# Patient Record
Sex: Female | Born: 1945 | Race: White | Hispanic: No | Marital: Married | Smoking: Never smoker
Health system: Southern US, Community
[De-identification: ages and names within clinical notes are randomized; demographics above are authoritative.]

## PROBLEM LIST (undated history)

## (undated) DIAGNOSIS — E079 Disorder of thyroid, unspecified: Secondary | ICD-10-CM

## (undated) DIAGNOSIS — D649 Anemia, unspecified: Secondary | ICD-10-CM

## (undated) DIAGNOSIS — E119 Type 2 diabetes mellitus without complications: Secondary | ICD-10-CM

## (undated) DIAGNOSIS — E7800 Pure hypercholesterolemia, unspecified: Secondary | ICD-10-CM

## (undated) DIAGNOSIS — M109 Gout, unspecified: Secondary | ICD-10-CM

## (undated) DIAGNOSIS — H269 Unspecified cataract: Secondary | ICD-10-CM

## (undated) DIAGNOSIS — J189 Pneumonia, unspecified organism: Secondary | ICD-10-CM

## (undated) DIAGNOSIS — M1990 Unspecified osteoarthritis, unspecified site: Secondary | ICD-10-CM

## (undated) DIAGNOSIS — I10 Essential (primary) hypertension: Secondary | ICD-10-CM

## (undated) HISTORY — DX: Gout, unspecified: M10.9

## (undated) HISTORY — PX: ABDOMINAL HYSTERECTOMY: SHX81

## (undated) HISTORY — DX: Unspecified cataract: H26.9

## (undated) HISTORY — DX: Anemia, unspecified: D64.9

## (undated) HISTORY — DX: Essential (primary) hypertension: I10

## (undated) HISTORY — PX: TUBAL LIGATION: SHX77

## (undated) HISTORY — PX: COLONOSCOPY: SHX174

## (undated) HISTORY — DX: Unspecified osteoarthritis, unspecified site: M19.90

## (undated) HISTORY — PX: OTHER SURGICAL HISTORY: SHX169

## (undated) HISTORY — DX: Disorder of thyroid, unspecified: E07.9

---

## 1998-06-03 ENCOUNTER — Inpatient Hospital Stay (HOSPITAL_COMMUNITY): Admission: EM | Admit: 1998-06-03 | Discharge: 1998-06-08 | Payer: Self-pay | Admitting: Obstetrics and Gynecology

## 1998-09-17 ENCOUNTER — Other Ambulatory Visit: Admission: RE | Admit: 1998-09-17 | Discharge: 1998-09-17 | Payer: Self-pay | Admitting: Obstetrics and Gynecology

## 1999-09-30 ENCOUNTER — Other Ambulatory Visit: Admission: RE | Admit: 1999-09-30 | Discharge: 1999-09-30 | Payer: Self-pay | Admitting: Obstetrics and Gynecology

## 1999-10-14 ENCOUNTER — Encounter: Admission: RE | Admit: 1999-10-14 | Discharge: 1999-10-14 | Payer: Self-pay | Admitting: Obstetrics and Gynecology

## 1999-10-14 ENCOUNTER — Encounter: Payer: Self-pay | Admitting: Obstetrics and Gynecology

## 2000-09-29 ENCOUNTER — Other Ambulatory Visit: Admission: RE | Admit: 2000-09-29 | Discharge: 2000-09-29 | Payer: Self-pay | Admitting: Obstetrics and Gynecology

## 2000-11-10 ENCOUNTER — Encounter: Admission: RE | Admit: 2000-11-10 | Discharge: 2000-11-10 | Payer: Self-pay | Admitting: Obstetrics and Gynecology

## 2000-11-10 ENCOUNTER — Encounter: Payer: Self-pay | Admitting: Obstetrics and Gynecology

## 2001-09-11 ENCOUNTER — Ambulatory Visit (HOSPITAL_COMMUNITY): Admission: RE | Admit: 2001-09-11 | Discharge: 2001-09-11 | Payer: Self-pay | Admitting: Gastroenterology

## 2001-10-04 ENCOUNTER — Other Ambulatory Visit: Admission: RE | Admit: 2001-10-04 | Discharge: 2001-10-04 | Payer: Self-pay | Admitting: Obstetrics and Gynecology

## 2001-11-14 ENCOUNTER — Encounter: Admission: RE | Admit: 2001-11-14 | Discharge: 2001-11-14 | Payer: Self-pay | Admitting: Obstetrics and Gynecology

## 2001-11-14 ENCOUNTER — Encounter: Payer: Self-pay | Admitting: Obstetrics and Gynecology

## 2002-11-19 ENCOUNTER — Encounter: Admission: RE | Admit: 2002-11-19 | Discharge: 2002-11-19 | Payer: Self-pay | Admitting: Obstetrics and Gynecology

## 2002-11-19 ENCOUNTER — Encounter: Payer: Self-pay | Admitting: Obstetrics and Gynecology

## 2003-11-25 ENCOUNTER — Encounter: Admission: RE | Admit: 2003-11-25 | Discharge: 2003-11-25 | Payer: Self-pay | Admitting: Obstetrics and Gynecology

## 2004-12-04 ENCOUNTER — Encounter: Admission: RE | Admit: 2004-12-04 | Discharge: 2004-12-04 | Payer: Self-pay | Admitting: Obstetrics and Gynecology

## 2005-12-07 ENCOUNTER — Encounter: Admission: RE | Admit: 2005-12-07 | Discharge: 2005-12-07 | Payer: Self-pay | Admitting: Obstetrics and Gynecology

## 2006-12-09 ENCOUNTER — Encounter: Admission: RE | Admit: 2006-12-09 | Discharge: 2006-12-09 | Payer: Self-pay | Admitting: Obstetrics and Gynecology

## 2007-12-12 ENCOUNTER — Encounter: Admission: RE | Admit: 2007-12-12 | Discharge: 2007-12-12 | Payer: Self-pay | Admitting: Obstetrics and Gynecology

## 2008-12-12 ENCOUNTER — Encounter: Admission: RE | Admit: 2008-12-12 | Discharge: 2008-12-12 | Payer: Self-pay | Admitting: Obstetrics and Gynecology

## 2009-12-17 ENCOUNTER — Encounter: Admission: RE | Admit: 2009-12-17 | Discharge: 2009-12-17 | Payer: Self-pay | Admitting: Obstetrics and Gynecology

## 2010-12-04 ENCOUNTER — Other Ambulatory Visit: Payer: Self-pay | Admitting: Obstetrics and Gynecology

## 2010-12-04 DIAGNOSIS — Z1231 Encounter for screening mammogram for malignant neoplasm of breast: Secondary | ICD-10-CM

## 2010-12-16 ENCOUNTER — Ambulatory Visit: Payer: Self-pay | Admitting: Vascular Surgery

## 2010-12-23 ENCOUNTER — Ambulatory Visit
Admission: RE | Admit: 2010-12-23 | Discharge: 2010-12-23 | Disposition: A | Discharge status: Home / Self Care | Payer: 59 | Source: Ambulatory Visit | Attending: Obstetrics and Gynecology | Admitting: Obstetrics and Gynecology

## 2010-12-23 DIAGNOSIS — Z1231 Encounter for screening mammogram for malignant neoplasm of breast: Secondary | ICD-10-CM

## 2011-01-08 NOTE — Op Note (Signed)
[facility]  Patient:    Isabella Dixon, DUROCHER Visit Number: [id number] MRN: [id number]          Service Type: END Location: ENDO Attending Physician:  Louie Bun Dictated by:   Everardo All Madilyn Fireman, M.D. Proc. Date: 09/11/01 Admit Date:  09/11/2001   CC:         Dr. Earl Lites, [facility] [address] [place], [place] [address]                           Operative Report  PROCEDURE:  Colonoscopy.  INDICATION FOR PROCEDURE:  History of adenomatous colon polyps found on initial colonoscopy three years ago.  DESCRIPTION OF PROCEDURE:  The patient was placed in the left lateral decubitus position and placed on the pulse monitor with continuous low flow oxygen delivered by nasal cannula. She was sedated with 120 mg IV Demerol and 12 mg IV Versed and was given 0.4 mg IV Narcan after the procedure due to low oxygen saturations. The Olympus video colonoscope was inserted into the rectum and advanced to the cecum, confirmed by transillumination of McBurneys point and visualization of the ileocecal valve and appendiceal orifice. The prep was generally good distal to the hepatic flexure with somewhat limited proximal to this and I could not rule out small lesions in the right colon less than 1 cm in diameter. Otherwise, the cecum, ascending, transverse, descending, and sigmoid colon all appeared normal with no masses, polyps, diverticula, or mucosal abnormalities. The rectum likewise appeared normal and retroflex view of the anus revealed no obvious internal hemorrhoids. The colonoscope was then withdrawn and the patient returned to the recovery room in stable condition. She tolerated the procedure well with the exception of some decreased oxygen saturation from fairly high amounts of sedation required, but there were no immediate complications.  IMPRESSION:  Essentially normal colonoscopy.  PLAN:  Repeat colonoscopy in five years. Dictated  by:   Everardo All Madilyn Fireman, M.D. Attending Physician:  Louie Bun DD:  09/11/01 TD:  09/11/01 Job: [id number]

## 2011-03-31 LAB — HM COLONOSCOPY

## 2011-12-21 ENCOUNTER — Other Ambulatory Visit: Payer: Self-pay | Admitting: Obstetrics and Gynecology

## 2011-12-21 DIAGNOSIS — Z1231 Encounter for screening mammogram for malignant neoplasm of breast: Secondary | ICD-10-CM

## 2012-01-12 ENCOUNTER — Ambulatory Visit
Admission: RE | Admit: 2012-01-12 | Discharge: 2012-01-12 | Disposition: A | Discharge status: Home / Self Care | Payer: 59 | Source: Ambulatory Visit | Attending: Obstetrics and Gynecology | Admitting: Obstetrics and Gynecology

## 2012-01-12 DIAGNOSIS — Z1231 Encounter for screening mammogram for malignant neoplasm of breast: Secondary | ICD-10-CM

## 2012-02-23 LAB — CBC AND DIFFERENTIAL
HCT: 35 % — AB (ref 36–46)
Hemoglobin: 11.1 g/dL — AB (ref 12.0–16.0)
WBC: 4.2 10^3/mL

## 2012-02-23 LAB — BASIC METABOLIC PANEL
BUN: 30 mg/dL — AB (ref 4–21)
Glucose: 88 mg/dL
Potassium: 4.2 mmol/L (ref 3.4–5.3)
Sodium: 141 mmol/L (ref 137–147)

## 2012-02-23 LAB — HEPATIC FUNCTION PANEL: Alkaline Phosphatase: 27 U/L (ref 25–125)

## 2012-03-13 ENCOUNTER — Other Ambulatory Visit: Payer: Self-pay | Admitting: *Deleted

## 2012-03-13 MED ORDER — CHOLINE FENOFIBRATE 135 MG PO CPDR: 135.0000 mg | DELAYED_RELEASE_CAPSULE | Freq: Every day | ORAL | Status: DC

## 2012-03-13 MED ORDER — METOPROLOL TARTRATE 25 MG PO TABS: 25.0000 mg | ORAL_TABLET | Freq: Two times a day (BID) | ORAL | Status: DC

## 2012-03-13 MED ORDER — LOSARTAN POTASSIUM 100 MG PO TABS: 100.0000 mg | ORAL_TABLET | Freq: Every day | ORAL | Status: DC

## 2012-03-13 MED ORDER — ROSUVASTATIN CALCIUM 20 MG PO TABS: 20.0000 mg | ORAL_TABLET | Freq: Every day | ORAL | Status: DC

## 2012-05-02 ENCOUNTER — Ambulatory Visit (INDEPENDENT_AMBULATORY_CARE_PROVIDER_SITE_OTHER): Payer: Medicare Other | Admitting: Emergency Medicine

## 2012-05-02 ENCOUNTER — Encounter: Payer: Self-pay | Admitting: Emergency Medicine

## 2012-05-02 VITALS — BP 118/80 | HR 70 | Temp 98.7°F | Resp 16 | Ht 62.5 in | Wt 187.4 lb

## 2012-05-02 DIAGNOSIS — Z23 Encounter for immunization: Secondary | ICD-10-CM

## 2012-05-02 DIAGNOSIS — R87629 Unspecified abnormal cytological findings in specimens from vagina: Secondary | ICD-10-CM | POA: Insufficient documentation

## 2012-05-02 DIAGNOSIS — E039 Hypothyroidism, unspecified: Secondary | ICD-10-CM

## 2012-05-02 DIAGNOSIS — M109 Gout, unspecified: Secondary | ICD-10-CM

## 2012-05-02 DIAGNOSIS — M1990 Unspecified osteoarthritis, unspecified site: Secondary | ICD-10-CM

## 2012-05-02 DIAGNOSIS — E785 Hyperlipidemia, unspecified: Secondary | ICD-10-CM

## 2012-05-02 DIAGNOSIS — R739 Hyperglycemia, unspecified: Secondary | ICD-10-CM | POA: Insufficient documentation

## 2012-05-02 DIAGNOSIS — IMO0002 Reserved for concepts with insufficient information to code with codable children: Secondary | ICD-10-CM | POA: Insufficient documentation

## 2012-05-02 DIAGNOSIS — I10 Essential (primary) hypertension: Secondary | ICD-10-CM | POA: Insufficient documentation

## 2012-05-02 DIAGNOSIS — Z0000 Encounter for general adult medical examination without abnormal findings: Secondary | ICD-10-CM

## 2012-05-02 LAB — CBC WITH DIFFERENTIAL/PLATELET
Lymphocytes Relative: 54 % — ABNORMAL HIGH (ref 12–46)
MCHC: 32.6 g/dL (ref 30.0–36.0)
Neutro Abs: 1.4 10*3/uL — ABNORMAL LOW (ref 1.7–7.7)
Neutrophils Relative %: 30 % — ABNORMAL LOW (ref 43–77)
Platelets: 340 10*3/uL (ref 150–400)
RBC: 3.77 MIL/uL — ABNORMAL LOW (ref 3.87–5.11)

## 2012-05-02 LAB — COMPREHENSIVE METABOLIC PANEL
ALT: 33 U/L (ref 0–35)
Alkaline Phosphatase: 28 U/L — ABNORMAL LOW (ref 39–117)
BUN: 29 mg/dL — ABNORMAL HIGH (ref 6–23)
Calcium: 9.7 mg/dL (ref 8.4–10.5)
Glucose, Bld: 86 mg/dL (ref 70–99)
Potassium: 4.3 mEq/L (ref 3.5–5.3)
Total Bilirubin: 0.6 mg/dL (ref 0.3–1.2)
Total Protein: 8.1 g/dL (ref 6.0–8.3)

## 2012-05-02 LAB — LIPID PANEL
LDL Cholesterol: 37 mg/dL (ref 0–99)
VLDL: 43 mg/dL — ABNORMAL HIGH (ref 0–40)

## 2012-05-02 LAB — POCT UA - MICROSCOPIC ONLY
Casts, Ur, LPF, POC: NEGATIVE
WBC, Ur, HPF, POC: NEGATIVE

## 2012-05-02 LAB — POCT URINALYSIS DIPSTICK
Leukocytes, UA: NEGATIVE
Nitrite, UA: NEGATIVE
Spec Grav, UA: 1.02
pH, UA: 6

## 2012-05-02 LAB — POCT GLYCOSYLATED HEMOGLOBIN (HGB A1C): Hemoglobin A1C: 5.2

## 2012-05-02 MED ORDER — PNEUMOCOCCAL VAC POLYVALENT 25 MCG/0.5ML IJ INJ: 0.5000 mL | INJECTION | INTRAMUSCULAR | Status: DC

## 2012-05-02 NOTE — Progress Notes (Signed)
@[facility]FCLOGO @  Patient ID: Isabella Dixon MRN: [id number], DOB: 23-Dec-1945, 66 y.o. Date of Encounter: 05/02/2012, 5:12 PM  Primary Physician: No primary provider on file.  Chief Complaint: Physical (CPE)  HPI: 66 y.o. y/o female with history of noted below here for CPE.  Doing well. No issues/complaints.  LMP:  Pap: MMG: Review of Systems:  Consitutional: No fever, chills, fatigue, sweats  , lymphadenopathy, or weight changes. Eyes: No visual changes, eye redness, or discharge. 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 Nose: No congestion, rhinorrhea, sinus pain, or epistaxis. Throat: No sore throat, post nasal drip, or teeth pain. Cardiovascular: No CP, palpitations, diaphoresis, DOE, edema, orthopnea, PND. Respiratory: She has a history of exercise-induced asthma she has Dulera to take but rarely takes it. As a pro-air inhaler to use as  Gastrointestinal: No anorexia, dysphagia, reflux, pain, nausea, vomiting, hematemesis, diarrhea, constipation, BRBPR, or melena. Breast: No discharge, pain, swelling, or mass. Genitourinary: No dysuria, frequency, urgency, hematuria, incontinence, nocturia, amenorrhea, vaginal discharge, pruritis, burning, abnormal bleeding, or pain. Musculoskeletal: The patient is under the care of Dr. Kellie Simmering for gout and osteoarthritis. She has pain in multiple joints especially in her hips. She has back pain and hip pain to . Skin: No rash, erythema, lesion changes, pain, warmth, jaundice, or pruritis. Neurological: No headache, dizziness, syncope, seizures, tremors, memory loss, coordination problems, or paresthesias. Psychological: No anxiety, depression, hallucinations, SI/HI. Endocrine: No fatigue, polydipsia, polyphagia, polyuria, or known diabetes. All other systems were reviewed and are otherwise negative.  No past medical history on file.   No past surgical history on file.  Home Meds:    Prior to Admission medications   Medication Sig Start Date End Date Taking? Authorizing Provider  alendronate (FOSAMAX) 70 MG tablet Take 70 mg by mouth every 7 (seven) days. Take with a full glass of water on an empty stomach.   Yes Historical Provider, MD  Choline Fenofibrate (TRILIPIX) 135 MG capsule Take 1 capsule (135 mg total) by mouth daily. 03/13/12 03/13/13 Yes Collene Gobble, MD  losartan (COZAAR) 100 MG tablet Take 1 tablet (100 mg total) by mouth daily. 03/13/12 03/13/13 Yes Collene Gobble, MD  metoprolol tartrate (LOPRESSOR) 25 MG tablet Take 1 tablet (25 mg total) by mouth 2 (two) times daily. 03/13/12 03/13/13 Yes Collene Gobble, MD  Multiple Vitamin (MULTIVITAMIN) tablet Take 1 tablet by mouth daily.   Yes Historical Provider, MD  OVER THE COUNTER MEDICATION OTC Vit D 1400 mg daily   Yes Historical Provider, MD  probenecid (BENEMID) 500 MG tablet Take 500 mg by mouth 2 (two) times daily.   Yes Historical Provider, MD  rosuvastatin (CRESTOR) 20 MG tablet Take 1 tablet (20 mg total) by mouth daily. 03/13/12 03/13/13 Yes Collene Gobble, MD    Allergies:  Allergies  Allergen Reactions  . Neosporin (Neomycin-Bacitracin Zn-Polymyx)     Rash      History   Social History  . Marital Status: Married    Spouse Name: N/A    Number of Children: N/A  . Years of Education: N/A   Occupational History  . Not on file.   Social History Main Topics  . Smoking status: Never Smoker   . Smokeless tobacco: Not on file  . Alcohol Use: No  . Drug Use: No  . Sexually Active: Not on file   Other Topics Concern  . Not on file   Social History Narrative  . No  narrative on file    No family history on file.  Physical Exam  Blood pressure 118/80, pulse 70, temperature 98.7 F (37.1 C), temperature source Oral, resp. rate 16, height 5' 2.5" (1.588 m), weight 187 lb 6.4 oz (85.004 kg), SpO2 96.00%., Body mass index is 33.73 kg/(m^2). General: Well developed, well nourished, in no acute  distress. HEENT: Normocephalic, atraumatic. Conjunctiva pink, sclera non-icteric. Pupils 2 mm constricting to 1 mm, round, regular, and equally reactive to light and accomodation. EOMI. Internal auditory canal clear. TMs with good cone of light and without pathology. Nasal mucosa pink. Nares are without discharge. No sinus tenderness. Oral mucosa pink. Dentition normal. Pharynx without exudate.   Neck: Supple. Trachea midline. No thyromegaly. Full ROM. No lymphadenopathy. Lungs: Clear to auscultation bilaterally without wheezes, rales, or rhonchi. Breathing is of normal effort and unlabored. Cardiovascular: RRR with S1 S2. No murmurs, rubs, or gallops appreciated. Distal pulses 2+ symmetrically. No carotid or abdominal bruits.  Breast: No masses palpable  Abdomen: Soft, non-tender, non-distended with normoactive bowel sounds. No hepatosplenomegaly or masses. No rebound/guarding. No CVA tenderness. Without hernias.  Genitourinary: Not performed she sees Dr. Ambrose Mantle for her GYN exam     Musculoskeletal: Full range of motion and 5/5 strength throughout. Without swelling, atrophy, tenderness, crepitus, or warmth. Extremities without clubbing, cyanosis, or edema. Calves supple. Skin: Warm and moist without erythema, ecchymosis, wounds, or rash. Neuro: A+Ox3. CN II-XII grossly intact. Moves all extremities spontaneously. Full sensation throughout. Normal gait. DTR 2+ throughout upper and lower extremities. Finger to nose intact. Psych:  Responds to questions appropriately with a normal affect.        Assessment/Plan:  66 y.o. y/o female here for CPE she overall is doing well except for her problems with arthritis. Her blood pressure is well controlled on current medication regimen. She continues on Fosamax once a week and we will schedule a repeat bone density study to be sure this is indicated she was given a flu shot Pneumovax and prescription for shingles today.  -  Signed, Earl Lites,  MD 05/02/2012 5:12 PM

## 2012-05-03 ENCOUNTER — Encounter: Payer: Self-pay | Admitting: Radiology

## 2012-05-03 MED ORDER — LEVOTHYROXINE SODIUM 75 MCG PO TABS: 75.0000 ug | ORAL_TABLET | Freq: Every day | ORAL | Status: DC

## 2012-05-03 NOTE — Addendum Note (Signed)
Addended byCaffie Damme on: 05/03/2012 03:42 PM   Modules accepted: Orders

## 2012-05-04 ENCOUNTER — Encounter: Payer: Self-pay | Admitting: Family Medicine

## 2012-05-29 ENCOUNTER — Encounter: Payer: Self-pay | Admitting: Physician Assistant

## 2012-07-24 ENCOUNTER — Telehealth: Payer: Self-pay

## 2012-07-24 NOTE — Telephone Encounter (Signed)
Called pt to notify her that I have tried to get prior auth for the zostavax vaccine and it is not covered by her [facility] nor her [facility]. Pt stated she will pay OOP to get it. Faxed denial of PA to pharmacy.

## 2012-07-25 ENCOUNTER — Encounter: Payer: Self-pay | Admitting: Emergency Medicine

## 2012-07-25 ENCOUNTER — Ambulatory Visit (INDEPENDENT_AMBULATORY_CARE_PROVIDER_SITE_OTHER): Payer: Medicare Other | Admitting: Emergency Medicine

## 2012-07-25 VITALS — BP 116/71 | HR 64 | Temp 97.2°F | Resp 16 | Ht 62.0 in | Wt 188.0 lb

## 2012-07-25 DIAGNOSIS — E039 Hypothyroidism, unspecified: Secondary | ICD-10-CM

## 2012-07-25 NOTE — Progress Notes (Signed)
  Subjective:    Patient ID: Isabella Dixon, female    DOB: 14-May-1946, 66 y.o.   MRN: [id number]  HPI patient was seen here 2 months, and found to be hypothyroid. At that time she was started on thyroid replacement at 0.075 of Synthroid daily. Initially she started having trouble with headaches but these have since reviewed. She has any improved energy level but overall has noted no other  changes since starting thyroid medication    Review of Systems     Objective:   Physical Exam thyroid is normal in size and texture. Her chest was clear her cardiac exam is unremarkable. Her extremity exam is reveals no edema        Assessment & Plan:  Patient currently on thyroid replacement therapy. We'll check level today and adjust dose accordingly

## 2012-07-26 ENCOUNTER — Other Ambulatory Visit: Payer: Self-pay | Admitting: Emergency Medicine

## 2012-07-26 LAB — T4, FREE: Free T4: 1.14 ng/dL (ref 0.80–1.80)

## 2012-07-27 ENCOUNTER — Other Ambulatory Visit: Payer: Self-pay | Admitting: Radiology

## 2012-07-27 ENCOUNTER — Other Ambulatory Visit: Payer: Self-pay | Admitting: Emergency Medicine

## 2012-07-27 DIAGNOSIS — E039 Hypothyroidism, unspecified: Secondary | ICD-10-CM

## 2012-07-27 MED ORDER — LEVOTHYROXINE SODIUM 75 MCG PO TABS: 75.0000 ug | ORAL_TABLET | Freq: Every day | ORAL | Status: DC

## 2013-02-16 ENCOUNTER — Other Ambulatory Visit: Payer: Self-pay

## 2013-02-16 DIAGNOSIS — Z1231 Encounter for screening mammogram for malignant neoplasm of breast: Secondary | ICD-10-CM

## 2013-03-01 ENCOUNTER — Telehealth: Payer: Self-pay

## 2013-03-01 ENCOUNTER — Other Ambulatory Visit: Payer: Self-pay | Admitting: Radiology

## 2013-03-01 MED ORDER — METOPROLOL TARTRATE 25 MG PO TABS: 25.0000 mg | ORAL_TABLET | Freq: Two times a day (BID) | ORAL | Status: DC

## 2013-03-01 MED ORDER — CHOLINE FENOFIBRATE 135 MG PO CPDR: 135.0000 mg | DELAYED_RELEASE_CAPSULE | Freq: Every day | ORAL | Status: DC

## 2013-03-01 MED ORDER — LOSARTAN POTASSIUM 100 MG PO TABS: 100.0000 mg | ORAL_TABLET | Freq: Every day | ORAL | Status: DC

## 2013-03-01 MED ORDER — ROSUVASTATIN CALCIUM 20 MG PO TABS: 20.0000 mg | ORAL_TABLET | Freq: Every day | ORAL | Status: DC

## 2013-03-01 NOTE — Telephone Encounter (Signed)
Resent to mail order, had sent to [facility] in error.

## 2013-03-01 NOTE — Telephone Encounter (Signed)
Pt needs the following refilled:   Losartan  Crestor  Choline Fenofibrate (TRILIPIX) 135 MG capsule Metroprolol  [facility]: [phone] Best# [phone]

## 2013-03-01 NOTE — Telephone Encounter (Signed)
She needs follow up she is transferred, to make appt. she will come in sept meds sent.

## 2013-03-14 ENCOUNTER — Ambulatory Visit
Admission: RE | Admit: 2013-03-14 | Discharge: 2013-03-14 | Disposition: A | Discharge status: Home / Self Care | Payer: Medicare Other | Source: Ambulatory Visit

## 2013-03-14 DIAGNOSIS — Z1231 Encounter for screening mammogram for malignant neoplasm of breast: Secondary | ICD-10-CM

## 2013-03-20 ENCOUNTER — Other Ambulatory Visit: Payer: Self-pay | Admitting: Obstetrics and Gynecology

## 2013-03-20 DIAGNOSIS — M810 Age-related osteoporosis without current pathological fracture: Secondary | ICD-10-CM

## 2013-04-02 ENCOUNTER — Ambulatory Visit
Admission: RE | Admit: 2013-04-02 | Discharge: 2013-04-02 | Disposition: A | Discharge status: Home / Self Care | Payer: Medicare Other | Source: Ambulatory Visit | Attending: Obstetrics and Gynecology | Admitting: Obstetrics and Gynecology

## 2013-04-02 DIAGNOSIS — M810 Age-related osteoporosis without current pathological fracture: Secondary | ICD-10-CM

## 2013-05-08 ENCOUNTER — Encounter: Payer: Self-pay | Admitting: Emergency Medicine

## 2013-05-08 ENCOUNTER — Ambulatory Visit (INDEPENDENT_AMBULATORY_CARE_PROVIDER_SITE_OTHER): Payer: Medicare Other | Admitting: Emergency Medicine

## 2013-05-08 VITALS — BP 100/74 | HR 80 | Temp 98.0°F | Resp 16 | Ht 62.5 in | Wt 190.0 lb

## 2013-05-08 DIAGNOSIS — IMO0001 Reserved for inherently not codable concepts without codable children: Secondary | ICD-10-CM

## 2013-05-08 DIAGNOSIS — R7309 Other abnormal glucose: Secondary | ICD-10-CM

## 2013-05-08 DIAGNOSIS — E785 Hyperlipidemia, unspecified: Secondary | ICD-10-CM

## 2013-05-08 DIAGNOSIS — R739 Hyperglycemia, unspecified: Secondary | ICD-10-CM

## 2013-05-08 DIAGNOSIS — Z0000 Encounter for general adult medical examination without abnormal findings: Secondary | ICD-10-CM

## 2013-05-08 DIAGNOSIS — E039 Hypothyroidism, unspecified: Secondary | ICD-10-CM

## 2013-05-08 DIAGNOSIS — I10 Essential (primary) hypertension: Secondary | ICD-10-CM

## 2013-05-08 DIAGNOSIS — Z23 Encounter for immunization: Secondary | ICD-10-CM

## 2013-05-08 DIAGNOSIS — M109 Gout, unspecified: Secondary | ICD-10-CM

## 2013-05-08 DIAGNOSIS — M069 Rheumatoid arthritis, unspecified: Secondary | ICD-10-CM

## 2013-05-08 LAB — POCT URINALYSIS DIPSTICK
Bilirubin, UA: NEGATIVE
Blood, UA: NEGATIVE
Glucose, UA: NEGATIVE
Ketones, UA: NEGATIVE
Leukocytes, UA: NEGATIVE
Nitrite, UA: NEGATIVE
pH, UA: 6

## 2013-05-08 LAB — COMPREHENSIVE METABOLIC PANEL
ALT: 27 U/L (ref 0–35)
AST: 30 U/L (ref 0–37)
Albumin: 4.2 g/dL (ref 3.5–5.2)
Alkaline Phosphatase: 34 U/L — ABNORMAL LOW (ref 39–117)
Potassium: 4.1 mEq/L (ref 3.5–5.3)
Sodium: 140 mEq/L (ref 135–145)
Total Protein: 7.9 g/dL (ref 6.0–8.3)

## 2013-05-08 LAB — LIPID PANEL
LDL Cholesterol: 56 mg/dL (ref 0–99)
VLDL: 42 mg/dL — ABNORMAL HIGH (ref 0–40)

## 2013-05-08 LAB — CBC
MCHC: 33.9 g/dL (ref 30.0–36.0)
RDW: 14.2 % (ref 11.5–15.5)

## 2013-05-08 LAB — POCT GLYCOSYLATED HEMOGLOBIN (HGB A1C): Hemoglobin A1C: 5.6

## 2013-05-08 LAB — CK: Total CK: 130 U/L (ref 7–177)

## 2013-05-08 MED ORDER — LEVOTHYROXINE SODIUM 75 MCG PO TABS: 75.0000 ug | ORAL_TABLET | Freq: Every day | ORAL | Status: DC

## 2013-05-08 MED ORDER — ROSUVASTATIN CALCIUM 20 MG PO TABS: 20.0000 mg | ORAL_TABLET | Freq: Every day | ORAL | Status: DC

## 2013-05-08 MED ORDER — CHOLINE FENOFIBRATE 135 MG PO CPDR: 135.0000 mg | DELAYED_RELEASE_CAPSULE | Freq: Every day | ORAL | Status: DC

## 2013-05-08 MED ORDER — LOSARTAN POTASSIUM 100 MG PO TABS: 100.0000 mg | ORAL_TABLET | Freq: Every day | ORAL | Status: DC

## 2013-05-08 MED ORDER — METOPROLOL TARTRATE 25 MG PO TABS: 25.0000 mg | ORAL_TABLET | Freq: Two times a day (BID) | ORAL | Status: DC

## 2013-05-08 NOTE — Progress Notes (Signed)
  Subjective:    Patient ID: Isabella Dixon, female    DOB: [birth year], 67 y.o.   MRN: [id number]  HPI    Review of Systems  Constitutional: Positive for unexpected weight change.  HENT: Negative.   Eyes: Negative.   Respiratory: Negative.   Cardiovascular: Negative.   Gastrointestinal: Negative.   Endocrine: Negative.   Genitourinary: Negative.   Musculoskeletal: Negative.   Skin: Negative.   Allergic/Immunologic: Negative.   Neurological: Negative.   Hematological: Negative.   Psychiatric/Behavioral: Negative.        Objective:   Physical Exam HEENT exam is unremarkable neck is supple chest clear heart regular rate no murmurs abdomen soft liver and spleen not enlarged and no areas of tenderness extremities are without cyanosis clubbing or edema there is an ingrown toenail in the right great toe this will be of cotton was placed beneath the nail to elevate it. There is a half by half centimeter seborrheic keratosis upper outer quadrant of the left breast.   EKG      Assessment & Plan:

## 2013-05-09 LAB — VITAMIN D 25 HYDROXY (VIT D DEFICIENCY, FRACTURES): Vit D, 25-Hydroxy: 49 ng/mL (ref 30–89)

## 2013-05-10 ENCOUNTER — Telehealth: Payer: Self-pay | Admitting: Family Medicine

## 2013-05-10 NOTE — Telephone Encounter (Signed)
Faxed Rxs to [facility].

## 2014-02-11 ENCOUNTER — Other Ambulatory Visit: Payer: Self-pay

## 2014-02-11 DIAGNOSIS — Z1231 Encounter for screening mammogram for malignant neoplasm of breast: Secondary | ICD-10-CM

## 2014-03-15 ENCOUNTER — Ambulatory Visit
Admission: RE | Admit: 2014-03-15 | Discharge: 2014-03-15 | Disposition: A | Discharge status: Home / Self Care | Payer: Medicare Other | Source: Ambulatory Visit

## 2014-03-15 ENCOUNTER — Encounter (INDEPENDENT_AMBULATORY_CARE_PROVIDER_SITE_OTHER): Payer: Self-pay

## 2014-03-15 DIAGNOSIS — Z1231 Encounter for screening mammogram for malignant neoplasm of breast: Secondary | ICD-10-CM

## 2014-04-30 ENCOUNTER — Ambulatory Visit (INDEPENDENT_AMBULATORY_CARE_PROVIDER_SITE_OTHER): Payer: Medicare Other | Admitting: Emergency Medicine

## 2014-04-30 ENCOUNTER — Encounter: Payer: Self-pay | Admitting: Radiology

## 2014-04-30 ENCOUNTER — Other Ambulatory Visit: Payer: Self-pay | Admitting: Emergency Medicine

## 2014-04-30 ENCOUNTER — Encounter: Payer: Self-pay | Admitting: Emergency Medicine

## 2014-04-30 VITALS — BP 148/90 | HR 84 | Resp 18 | Ht 62.0 in | Wt 192.0 lb

## 2014-04-30 DIAGNOSIS — E038 Other specified hypothyroidism: Secondary | ICD-10-CM

## 2014-04-30 DIAGNOSIS — M1A9XX0 Chronic gout, unspecified, without tophus (tophi): Secondary | ICD-10-CM

## 2014-04-30 DIAGNOSIS — I10 Essential (primary) hypertension: Secondary | ICD-10-CM

## 2014-04-30 DIAGNOSIS — R7309 Other abnormal glucose: Secondary | ICD-10-CM

## 2014-04-30 DIAGNOSIS — M810 Age-related osteoporosis without current pathological fracture: Secondary | ICD-10-CM

## 2014-04-30 DIAGNOSIS — Z23 Encounter for immunization: Secondary | ICD-10-CM

## 2014-04-30 DIAGNOSIS — R739 Hyperglycemia, unspecified: Secondary | ICD-10-CM

## 2014-04-30 DIAGNOSIS — E785 Hyperlipidemia, unspecified: Secondary | ICD-10-CM

## 2014-04-30 DIAGNOSIS — E039 Hypothyroidism, unspecified: Secondary | ICD-10-CM

## 2014-04-30 DIAGNOSIS — M1A00X0 Idiopathic chronic gout, unspecified site, without tophus (tophi): Secondary | ICD-10-CM

## 2014-04-30 LAB — CBC WITH DIFFERENTIAL/PLATELET
BASOS ABS: 0.1 10*3/uL (ref 0.0–0.1)
Basophils Relative: 1 % (ref 0–1)
EOS ABS: 0.1 10*3/uL (ref 0.0–0.7)
EOS PCT: 2 % (ref 0–5)
HEMATOCRIT: 35.8 % — AB (ref 36.0–46.0)
Hemoglobin: 11.8 g/dL — ABNORMAL LOW (ref 12.0–15.0)
LYMPHS ABS: 2.6 10*3/uL (ref 0.7–4.0)
LYMPHS PCT: 48 % — AB (ref 12–46)
MCH: 30.3 pg (ref 26.0–34.0)
MCHC: 33 g/dL (ref 30.0–36.0)
MCV: 92 fL (ref 78.0–100.0)
MONO ABS: 0.5 10*3/uL (ref 0.1–1.0)
Monocytes Relative: 10 % (ref 3–12)
Neutro Abs: 2.1 10*3/uL (ref 1.7–7.7)
Neutrophils Relative %: 39 % — ABNORMAL LOW (ref 43–77)
Platelets: 294 10*3/uL (ref 150–400)
RBC: 3.89 MIL/uL (ref 3.87–5.11)
RDW: 14.3 % (ref 11.5–15.5)
WBC: 5.4 10*3/uL (ref 4.0–10.5)

## 2014-04-30 MED ORDER — ROSUVASTATIN CALCIUM 20 MG PO TABS
20.0000 mg | ORAL_TABLET | Freq: Every day | ORAL | Status: DC
Start: 2014-04-30 — End: 2015-06-02

## 2014-04-30 MED ORDER — METOPROLOL TARTRATE 25 MG PO TABS: 25.0000 mg | ORAL_TABLET | Freq: Two times a day (BID) | ORAL | Status: DC

## 2014-04-30 MED ORDER — LOSARTAN POTASSIUM 100 MG PO TABS: 100.0000 mg | ORAL_TABLET | Freq: Every day | ORAL | Status: DC

## 2014-04-30 MED ORDER — CHOLINE FENOFIBRATE 135 MG PO CPDR: 135.0000 mg | DELAYED_RELEASE_CAPSULE | Freq: Every day | ORAL | Status: DC

## 2014-04-30 MED ORDER — LEVOTHYROXINE SODIUM 75 MCG PO TABS: 75.0000 ug | ORAL_TABLET | Freq: Every day | ORAL | Status: DC

## 2014-04-30 NOTE — Progress Notes (Signed)
Subjective:  This chart was scribed for Lesle Chris, MD by Carl Best, Medical Scribe. This patient was seen in Room 24 and the patient's care was started at 4:49 PM.    Patient ID: Isabella Dixon, female    DOB: 11-10-1945, 68 y.o.   MRN: [id number]  HPI HPI Comments: Isabella Dixon is a 68 y.o. female who presents to the [facility] for a refill of all of her medications.  She no longer takes Fosamax.  She is scheduled for a CPE on December 3.  She is not complaining of any medical problems.  She had a normal mammogram on July 24 and her colonoscopy is UTD.     Past Medical History  Diagnosis Date  . Arthritis   . Anemia   . Osteoporosis   . Hypertension   . Gout    Past Surgical History  Procedure Laterality Date  . Abdominal hysterectomy    . Tubal ligation     Family History  Problem Relation Age of Onset  . Heart disease Mother   . Stroke Mother   . Hypertension Mother   . Stroke Father   . Cancer Sister   . Cancer Son   . Stroke Paternal Grandmother   . Cancer Paternal Grandmother   . Stroke Maternal Grandmother   . Cancer Paternal Grandfather    History   Social History  . Marital Status: Married    Spouse Name: N/A    Number of Children: N/A  . Years of Education: N/A   Occupational History  . Not on file.   Social History Main Topics  . Smoking status: Never Smoker   . Smokeless tobacco: Not on file  . Alcohol Use: No  . Drug Use: No  . Sexual Activity: Yes   Other Topics Concern  . Not on file   Social History Narrative  . No narrative on file   Allergies  Allergen Reactions  . Neosporin [Neomycin-Bacitracin Zn-Polymyx]     Rash      Review of Systems   Objective:  Physical Exam  Nursing note and vitals reviewed. Constitutional: She is oriented to person, place, and time. She appears well-developed and well-nourished.  HENT:  Head: Normocephalic and atraumatic.  Eyes: EOM are normal.  Neck:  Normal range of motion. Neck supple. No thyromegaly present.  Cardiovascular: Normal rate and regular rhythm.  Exam reveals no gallop and no friction rub.   No murmur heard. Pulmonary/Chest: Effort normal and breath sounds normal. No respiratory distress. She has no wheezes. She has no rales.  Abdominal: Soft. Bowel sounds are normal. There is no tenderness.  Musculoskeletal: Normal range of motion. She exhibits no edema.  Lymphadenopathy:    She has no cervical adenopathy.  Neurological: She is alert and oriented to person, place, and time.  Skin: Skin is warm and dry. No rash noted.  Psychiatric: She has a normal mood and affect. Her behavior is normal.     BP 148/90  Pulse 84  Resp 18  Ht  (1.575 m)  Wt 192 lb (87.091 kg)  BMI 35.11 kg/m2  SpO2 96% Assessment & Plan:  I personally performed the services described in this documentation, which was scribed in my presence. The recorded information has been reviewed and is accurate.  Meds refilled for one year.  She is coming in in December and will do her full physical at that time.  She does have a GYN doctor and  is up to date on colonoscopy and mammogram.

## 2014-05-01 LAB — COMPLETE METABOLIC PANEL WITH GFR
ALT: 24 U/L (ref 0–35)
AST: 28 U/L (ref 0–37)
Albumin: 4.2 g/dL (ref 3.5–5.2)
Alkaline Phosphatase: 49 U/L (ref 39–117)
BILIRUBIN TOTAL: 0.3 mg/dL (ref 0.2–1.2)
BUN: 21 mg/dL (ref 6–23)
CALCIUM: 9.6 mg/dL (ref 8.4–10.5)
CHLORIDE: 103 meq/L (ref 96–112)
CO2: 27 meq/L (ref 19–32)
CREATININE: 1 mg/dL (ref 0.50–1.10)
GFR, EST AFRICAN AMERICAN: 67 mL/min
GFR, Est Non African American: 58 mL/min — ABNORMAL LOW
Glucose, Bld: 163 mg/dL — ABNORMAL HIGH (ref 70–99)
Potassium: 3.9 mEq/L (ref 3.5–5.3)
Sodium: 140 mEq/L (ref 135–145)
Total Protein: 7.5 g/dL (ref 6.0–8.3)

## 2014-05-01 LAB — HEMOGLOBIN A1C
HEMOGLOBIN A1C: 6.1 % — AB (ref <5.7)
MEAN PLASMA GLUCOSE: 128 mg/dL — AB (ref <117)

## 2014-05-01 LAB — TSH: TSH: 2.197 u[IU]/mL (ref 0.350–4.500)

## 2014-05-01 LAB — VITAMIN D 25 HYDROXY (VIT D DEFICIENCY, FRACTURES): Vit D, 25-Hydroxy: 35 ng/mL (ref 30–89)

## 2014-05-01 LAB — T4: T4, Total: 5.6 ug/dL (ref 4.5–12.0)

## 2014-05-01 LAB — URIC ACID: URIC ACID, SERUM: 4.4 mg/dL (ref 2.4–7.0)

## 2014-07-25 ENCOUNTER — Encounter: Payer: Medicare Other | Admitting: Emergency Medicine

## 2014-10-21 ENCOUNTER — Encounter: Payer: Self-pay | Admitting: *Deleted

## 2014-12-24 ENCOUNTER — Encounter: Payer: Self-pay | Admitting: *Deleted

## 2015-03-19 ENCOUNTER — Encounter: Payer: Self-pay | Admitting: *Deleted

## 2015-03-20 ENCOUNTER — Other Ambulatory Visit: Payer: Self-pay

## 2015-03-20 DIAGNOSIS — Z1231 Encounter for screening mammogram for malignant neoplasm of breast: Secondary | ICD-10-CM

## 2015-03-21 ENCOUNTER — Telehealth: Payer: Self-pay | Admitting: *Deleted

## 2015-03-21 NOTE — Telephone Encounter (Signed)
Patient had LMTC on voicemail in response to my letter.  I returned her call & got answering machine.  LMTC

## 2015-03-24 NOTE — Telephone Encounter (Signed)
Patient returned call, LMTC. Returned patient's call, got answering machine & LMTC and advised her if she didn't get me (since we're obviously playing phone tag), to call the appt desk and left the main #.  Per her voicemail, she said she thought she had to make 2 separate appointments - one for routine physical and one for annual wellness - advised on message that I think they can be one in the same.

## 2015-04-30 ENCOUNTER — Ambulatory Visit
Admission: RE | Admit: 2015-04-30 | Discharge: 2015-04-30 | Disposition: A | Discharge status: Home / Self Care | Payer: Medicare Other | Source: Ambulatory Visit

## 2015-04-30 DIAGNOSIS — Z1231 Encounter for screening mammogram for malignant neoplasm of breast: Secondary | ICD-10-CM

## 2015-05-11 ENCOUNTER — Other Ambulatory Visit: Payer: Self-pay | Admitting: Emergency Medicine

## 2015-05-13 ENCOUNTER — Encounter: Payer: Self-pay | Admitting: Emergency Medicine

## 2015-05-13 ENCOUNTER — Ambulatory Visit (INDEPENDENT_AMBULATORY_CARE_PROVIDER_SITE_OTHER): Payer: Medicare Other | Admitting: Emergency Medicine

## 2015-05-13 VITALS — BP 105/70 | HR 76 | Temp 97.5°F | Resp 16 | Ht 62.0 in | Wt 199.0 lb

## 2015-05-13 DIAGNOSIS — E119 Type 2 diabetes mellitus without complications: Secondary | ICD-10-CM | POA: Diagnosis not present

## 2015-05-13 DIAGNOSIS — R739 Hyperglycemia, unspecified: Secondary | ICD-10-CM

## 2015-05-13 DIAGNOSIS — R7989 Other specified abnormal findings of blood chemistry: Secondary | ICD-10-CM | POA: Diagnosis not present

## 2015-05-13 DIAGNOSIS — E669 Obesity, unspecified: Secondary | ICD-10-CM

## 2015-05-13 DIAGNOSIS — Z1159 Encounter for screening for other viral diseases: Secondary | ICD-10-CM

## 2015-05-13 DIAGNOSIS — I10 Essential (primary) hypertension: Secondary | ICD-10-CM | POA: Diagnosis not present

## 2015-05-13 DIAGNOSIS — Z23 Encounter for immunization: Secondary | ICD-10-CM

## 2015-05-13 DIAGNOSIS — E038 Other specified hypothyroidism: Secondary | ICD-10-CM

## 2015-05-13 DIAGNOSIS — E785 Hyperlipidemia, unspecified: Secondary | ICD-10-CM

## 2015-05-13 DIAGNOSIS — M1A9XX0 Chronic gout, unspecified, without tophus (tophi): Secondary | ICD-10-CM

## 2015-05-13 DIAGNOSIS — M810 Age-related osteoporosis without current pathological fracture: Secondary | ICD-10-CM | POA: Diagnosis not present

## 2015-05-13 DIAGNOSIS — E1169 Type 2 diabetes mellitus with other specified complication: Secondary | ICD-10-CM | POA: Insufficient documentation

## 2015-05-13 LAB — COMPLETE METABOLIC PANEL WITH GFR
ALT: 29 U/L (ref 6–29)
AST: 32 U/L (ref 10–35)
Albumin: 4.2 g/dL (ref 3.6–5.1)
Alkaline Phosphatase: 49 U/L (ref 33–130)
BUN: 18 mg/dL (ref 7–25)
CO2: 26 mmol/L (ref 20–31)
CREATININE: 0.95 mg/dL (ref 0.50–0.99)
Calcium: 10 mg/dL (ref 8.6–10.4)
Chloride: 100 mmol/L (ref 98–110)
GFR, EST AFRICAN AMERICAN: 71 mL/min (ref >=60)
GFR, Est Non African American: 62 mL/min (ref >=60)
Glucose, Bld: 151 mg/dL — ABNORMAL HIGH (ref 65–99)
Potassium: 4.4 mmol/L (ref 3.5–5.3)
Sodium: 139 mmol/L (ref 135–146)
Total Bilirubin: 0.6 mg/dL (ref 0.2–1.2)
Total Protein: 8 g/dL (ref 6.1–8.1)

## 2015-05-13 LAB — CBC WITH DIFFERENTIAL/PLATELET
Basophils Absolute: 0 10*3/uL (ref 0.0–0.1)
Basophils Relative: 1 % (ref 0–1)
Eosinophils Absolute: 0.1 10*3/uL (ref 0.0–0.7)
Eosinophils Relative: 3 % (ref 0–5)
HEMATOCRIT: 41.1 % (ref 36.0–46.0)
HEMOGLOBIN: 13.8 g/dL (ref 12.0–15.0)
LYMPHS ABS: 2.1 10*3/uL (ref 0.7–4.0)
LYMPHS PCT: 48 % — AB (ref 12–46)
MCH: 31.5 pg (ref 26.0–34.0)
MCHC: 33.6 g/dL (ref 30.0–36.0)
MCV: 93.8 fL (ref 78.0–100.0)
MONO ABS: 0.5 10*3/uL (ref 0.1–1.0)
MONOS PCT: 12 % (ref 3–12)
MPV: 10.3 fL (ref 8.6–12.4)
NEUTROS ABS: 1.6 10*3/uL — AB (ref 1.7–7.7)
Neutrophils Relative %: 36 % — ABNORMAL LOW (ref 43–77)
Platelets: 272 10*3/uL (ref 150–400)
RBC: 4.38 MIL/uL (ref 3.87–5.11)
RDW: 13.7 % (ref 11.5–15.5)
WBC: 4.4 10*3/uL (ref 4.0–10.5)

## 2015-05-13 LAB — LIPID PANEL
CHOL/HDL RATIO: 6.2 ratio — AB (ref <=5.0)
CHOLESTEROL: 117 mg/dL — AB (ref 125–200)
HDL: 19 mg/dL — AB (ref >=46)
LDL Cholesterol: 59 mg/dL (ref <130)
TRIGLYCERIDES: 197 mg/dL — AB (ref <150)
VLDL: 39 mg/dL — ABNORMAL HIGH (ref <30)

## 2015-05-13 LAB — URIC ACID: Uric Acid, Serum: 4.3 mg/dL (ref 2.4–7.0)

## 2015-05-13 LAB — HEPATITIS C ANTIBODY: HCV AB: NEGATIVE

## 2015-05-13 LAB — TSH: TSH: 1.902 u[IU]/mL (ref 0.350–4.500)

## 2015-05-13 LAB — GLUCOSE, POCT (MANUAL RESULT ENTRY): POC GLUCOSE: 152 mg/dL — AB (ref 70–99)

## 2015-05-13 LAB — POCT GLYCOSYLATED HEMOGLOBIN (HGB A1C): Hemoglobin A1C: 7.7

## 2015-05-13 MED ORDER — METFORMIN HCL 500 MG PO TABS: 500.0000 mg | ORAL_TABLET | Freq: Two times a day (BID) | ORAL | Status: DC

## 2015-05-13 NOTE — Patient Instructions (Signed)

## 2015-05-13 NOTE — Progress Notes (Signed)
Subjective:     Patient ID: Isabella Dixon, female   DOB: [birth year], 69 y.o.   MRN: [id number] PCP: No primary care provider on file.   Chief Complaint  Patient presents with  . Medication Refill    HPI Patient presents today for medication refill and a general check-up. She needs refills on fenofibrate, metoprolol, losartan, crestor and synthroid.   Colonoscopy, mammogram, TDap are all up to date. Last pap smear was with GYN last year. She states that she has had her Zoster vaccine. She is interested in receiving her Hepatitis C screening as well as the Influenza vaccine. She has questions regarding the PNA vaccine, which were answered by Dr. Cleta Alberts and she has agreed to receive the PCV13 vaccine today. It has been over two years since her last DEXA scan. She is followed by Dr. Kellie Simmering, a rheumatologist, for her rheumatoid arthritis.   She currently has no complaints.  Review of Systems  Constitutional: Negative for fever, chills and fatigue.  Respiratory: Negative for shortness of breath.   Cardiovascular: Negative for chest pain.  Gastrointestinal: Negative for nausea, vomiting, abdominal pain, diarrhea and constipation.  Genitourinary: Negative for dysuria, urgency and frequency.  Musculoskeletal: Positive for arthralgias (Left knee due to arthritis). Negative for myalgias.  Skin: Negative for rash.  Neurological: Negative for dizziness, syncope, light-headedness and headaches.  Psychiatric/Behavioral: Negative.      Patient Active Problem List   Diagnosis Date Noted  . Hypothyroid 07/25/2012  . Gout 05/02/2012  . Osteoarthritis 05/02/2012  . Hypertension 05/02/2012  . Hyperlipidemia 05/02/2012  . Abnormal pap 05/02/2012  . Hyperglycemia 05/02/2012    Prior to Admission medications   Medication Sig Start Date End Date Taking? Authorizing Provider  aspirin EC 81 MG tablet Take 81 mg by mouth daily.   Yes Historical Provider, MD  Choline Fenofibrate (TRILIPIX) 135 MG  capsule Take 1 capsule (135 mg total) by mouth daily. 04/30/14 05/13/15 Yes Collene Gobble, MD  levothyroxine (SYNTHROID, LEVOTHROID) 75 MCG tablet Take 1 tablet (75 mcg total) by mouth daily. PATIENT NEEDS OFFICE VISIT/LABS FOR ADDITIONAL REFILLS 05/12/15  Yes Collene Gobble, MD  losartan (COZAAR) 100 MG tablet Take 1 tablet (100 mg total) by mouth daily. 04/30/14 05/13/15 Yes Collene Gobble, MD  metoprolol tartrate (LOPRESSOR) 25 MG tablet Take 1 tablet (25 mg total) by mouth 2 (two) times daily. 04/30/14 05/13/15 Yes Collene Gobble, MD  Multiple Vitamin (MULTIVITAMIN) tablet Take 1 tablet by mouth daily.   Yes Historical Provider, MD  OVER THE COUNTER MEDICATION OTC Vit D 1400 mg daily   Yes Historical Provider, MD  probenecid (BENEMID) 500 MG tablet Take 500 mg by mouth 2 (two) times daily.   Yes Historical Provider, MD  rosuvastatin (CRESTOR) 20 MG tablet Take 1 tablet (20 mg total) by mouth daily. 04/30/14 05/13/15 Yes Collene Gobble, MD    No Active Allergies    Objective:  Physical Exam  Constitutional: She is oriented to person, place, and time. She appears well-developed and well-nourished.  HENT:  Head: Normocephalic and atraumatic.  Eyes: Conjunctivae are normal. Pupils are equal, round, and reactive to light.  Neck: Normal range of motion. Neck supple.  Cardiovascular: Normal rate and regular rhythm.   Pulmonary/Chest: Effort normal and breath sounds normal.  Abdominal: Soft. Bowel sounds are normal. She exhibits no distension and no mass. There is no tenderness. There is no rebound and no guarding.  Neurological: She is alert and oriented to person, place, and  time.  Skin: Skin is warm and dry.  Psychiatric: She has a normal mood and affect. Her behavior is normal. Thought content normal.    BP 105/70 mmHg  Pulse 76  Temp(Src) 97.5 F (36.4 C)  Resp 16  Ht  (1.575 m)  Wt 199 lb (90.266 kg)  BMI 36.39 kg/m2    Assessment & Plan:  1. Need for immunization against influenza -  Flu Vaccine QUAD 36+ mos IM (Fluarix)  2. Hyperlipidemia Await lab results.  - Lipid panel  3. Hyperglycemia Last Hb A1C in September of 2015 was 6.1. Will recheck today.  Her hemoglobin A1c is 7.7 today. Will start Glucophage 500 mg 1 a day for the first week and then 1 twice a day recheck in 3 months. 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 Essential hypertension - COMPLETE METABOLIC PANEL WITH GFR  6. Abnormal CBC - CBC with Differential/Platelet  7. Need for hepatitis C screening test - Hepatitis C antibody  8. Other specified hypothyroidism - TSH  9. Osteoporosis Order placed for future DEXA scan.  - Vit D  25 hydroxy (rtn osteoporosis monitoring) - DG Bone Density; Future  10. Need for pneumococcal vaccine - Pneumococcal conjugate vaccine 13-valent IM    Amber D. Race, PA-S Physician Assistant Student [facility]

## 2015-05-14 LAB — VITAMIN D 25 HYDROXY (VIT D DEFICIENCY, FRACTURES): VIT D 25 HYDROXY: 33 ng/mL (ref 30–100)

## 2015-05-15 ENCOUNTER — Telehealth: Payer: Self-pay

## 2015-05-15 NOTE — Telephone Encounter (Signed)
Patient was seen recently by Dr. Cleta Alberts. She was prescribed metformin. It looks like it was sent to [facility]. She states if she waits on them then it will be about a week before she gets her medication and she didn't know if Dr. Cleta Alberts wanted her to start taking it now or if its ok to wait a week. [facility] and then once they become continuous they are sent to [facility]. She wanted this noted in her chart. She also wants to make sure all of her other meds (5 of them) were refilled. Dr. Cleta Alberts normally refills them all at the same time. Cb# [phone].

## 2015-05-16 NOTE — Telephone Encounter (Signed)
Can she wait or do we send into a local pharmacy?

## 2015-05-17 NOTE — Telephone Encounter (Signed)
Please send in the metformin prescription to the local pharmacy. Please also check her record and be sure all medications were refilled as instructed.

## 2015-05-19 ENCOUNTER — Other Ambulatory Visit: Payer: Self-pay

## 2015-05-19 DIAGNOSIS — E2839 Other primary ovarian failure: Secondary | ICD-10-CM

## 2015-05-19 NOTE — Telephone Encounter (Signed)
Spoke with pt, she received all her medications.

## 2015-06-02 ENCOUNTER — Other Ambulatory Visit: Payer: Self-pay | Admitting: Emergency Medicine

## 2015-06-02 ENCOUNTER — Telehealth: Payer: Self-pay

## 2015-06-04 ENCOUNTER — Other Ambulatory Visit: Payer: Self-pay

## 2015-06-04 DIAGNOSIS — E785 Hyperlipidemia, unspecified: Secondary | ICD-10-CM

## 2015-06-04 DIAGNOSIS — I10 Essential (primary) hypertension: Secondary | ICD-10-CM

## 2015-06-04 MED ORDER — CHOLINE FENOFIBRATE 135 MG PO CPDR: 135.0000 mg | DELAYED_RELEASE_CAPSULE | Freq: Every day | ORAL | Status: DC

## 2015-06-04 MED ORDER — LEVOTHYROXINE SODIUM 75 MCG PO TABS: 75.0000 ug | ORAL_TABLET | Freq: Every day | ORAL | Status: DC

## 2015-06-04 MED ORDER — METOPROLOL TARTRATE 25 MG PO TABS: 25.0000 mg | ORAL_TABLET | Freq: Two times a day (BID) | ORAL | Status: DC

## 2015-06-27 ENCOUNTER — Other Ambulatory Visit: Payer: Medicare Other

## 2015-07-07 ENCOUNTER — Ambulatory Visit
Admission: RE | Admit: 2015-07-07 | Discharge: 2015-07-07 | Disposition: A | Discharge status: Home / Self Care | Payer: Medicare Other | Source: Ambulatory Visit | Attending: Emergency Medicine | Admitting: Emergency Medicine

## 2015-07-07 DIAGNOSIS — E2839 Other primary ovarian failure: Secondary | ICD-10-CM

## 2015-07-10 ENCOUNTER — Telehealth: Payer: Self-pay

## 2015-07-10 NOTE — Telephone Encounter (Signed)
LMOM to cb.  See dexa scan.

## 2015-07-10 NOTE — Telephone Encounter (Signed)
PT called about missed call; no notes in epic-inquiring about DEX san? Please call to advise  [phone]

## 2015-08-07 ENCOUNTER — Other Ambulatory Visit: Payer: Self-pay

## 2015-08-07 MED ORDER — ROSUVASTATIN CALCIUM 20 MG PO TABS: ORAL_TABLET | ORAL | Status: DC

## 2015-08-07 MED ORDER — LOSARTAN POTASSIUM 100 MG PO TABS: ORAL_TABLET | ORAL | Status: DC

## 2015-08-14 ENCOUNTER — Encounter: Payer: Self-pay | Admitting: Emergency Medicine

## 2015-08-14 ENCOUNTER — Ambulatory Visit (INDEPENDENT_AMBULATORY_CARE_PROVIDER_SITE_OTHER): Payer: Medicare Other | Admitting: Emergency Medicine

## 2015-08-14 VITALS — BP 129/69 | HR 74 | Temp 98.5°F | Resp 16 | Ht 62.0 in | Wt 192.0 lb

## 2015-08-14 DIAGNOSIS — R739 Hyperglycemia, unspecified: Secondary | ICD-10-CM

## 2015-08-14 DIAGNOSIS — E119 Type 2 diabetes mellitus without complications: Secondary | ICD-10-CM

## 2015-08-14 DIAGNOSIS — I10 Essential (primary) hypertension: Secondary | ICD-10-CM

## 2015-08-14 LAB — GLUCOSE, POCT (MANUAL RESULT ENTRY): POC GLUCOSE: 130 mg/dL — AB (ref 70–99)

## 2015-08-14 LAB — POCT GLYCOSYLATED HEMOGLOBIN (HGB A1C): HEMOGLOBIN A1C: 5.7

## 2015-08-14 LAB — MICROALBUMIN, URINE: MICROALB UR: 0.4 mg/dL

## 2015-08-14 NOTE — Patient Instructions (Signed)
No change in medication. Congratulations on your hard work and improvement in your diabetes.

## 2015-08-14 NOTE — Progress Notes (Addendum)
Subjective:  This chart was scribed for Collene Gobble, MD by Veverly Fells, at [facility].  This patient was seen in room 21 and the patient's care was started at 9:58 AM.    Patient ID: Isabella Dixon, female    DOB: Dec 24, 1945, 69 y.o.   MRN: [id number] Chief Complaint  Patient presents with  . Follow-up  . Diabetes    HPI HPI Comments: Isabella Dixon is a 69 y.o. female with a history of hypertension,diabetes and hyperlipidemia who presents to the [facility] for a follow up.  She gets eye exams but does not go once a year.  She is willing to start seeing an ophthalmologist.  She denies any issues with her feet.  She notes of some swelling in her ankle but states that it has been there for a long time.  She denies any chest pain/shortness of breath. Patient is still seeing Dr. Ambrose Mantle.   Thyroid: Patient states that the thyroid medication she was taking did not make her feel any different but states that she is seeing changes with the Metformin.   Arthritis: Patients arthritis is doing well but states that her left knee is bothering her.     Family: Patients son has downs syndrome. He is doing well and enjoying the season.       Patient Active Problem List   Diagnosis Date Noted  . Diabetes mellitus type 2 in obese (HCC) 05/13/2015  . Hypothyroid 07/25/2012  . Gout 05/02/2012  . Osteoarthritis 05/02/2012  . Hypertension 05/02/2012  . Hyperlipidemia 05/02/2012  . Abnormal pap 05/02/2012  . Hyperglycemia 05/02/2012   Past Medical History  Diagnosis Date  . Arthritis   . Anemia   . Osteoporosis   . Hypertension   . Gout    Past Surgical History  Procedure Laterality Date  . Abdominal hysterectomy    . Tubal ligation     No Active Allergies Prior to Admission medications   Medication Sig Start Date End Date Taking? Authorizing Provider  aspirin EC 81 MG tablet Take 81 mg by mouth daily.    Historical  Provider, MD  Choline Fenofibrate (TRILIPIX) 135 MG capsule Take 1 capsule (135 mg total) by mouth daily. 06/04/15 06/16/16  Collene Gobble, MD  levothyroxine (SYNTHROID, LEVOTHROID) 75 MCG tablet Take 1 tablet (75 mcg total) by mouth daily. 06/04/15   Collene Gobble, MD  losartan (COZAAR) 100 MG tablet TAKE 1 TABLET DAILY 08/07/15   Collene Gobble, MD  metFORMIN (GLUCOPHAGE) 500 MG tablet Take 1 tablet (500 mg total) by mouth 2 (two) times daily with a meal. 05/13/15   Collene Gobble, MD  metoprolol tartrate (LOPRESSOR) 25 MG tablet Take 1 tablet (25 mg total) by mouth 2 (two) times daily. 06/04/15 06/16/16  Collene Gobble, MD  Multiple Vitamin (MULTIVITAMIN) tablet Take 1 tablet by mouth daily.    Historical Provider, MD  OVER THE COUNTER MEDICATION OTC Vit D 1400 mg daily    Historical Provider, MD  probenecid (BENEMID) 500 MG tablet Take 500 mg by mouth 2 (two) times daily.    Historical Provider, MD  rosuvastatin (CRESTOR) 20 MG tablet TAKE 1 TABLET DAILY 08/07/15   Collene Gobble, MD   Social History   Social History  . Marital Status: Married    Spouse Name: N/A  . Number of Children: N/A  . Years of Education: N/A   Occupational History  . Not  on file.   Social History Main Topics  . Smoking status: Never Smoker   . Smokeless tobacco: Not on file  . Alcohol Use: No  . Drug Use: No  . Sexual Activity: Yes   Other Topics Concern  . Not on file   Social History Narrative       Review of Systems  Constitutional: Negative for fever and chills.  Eyes: Negative for pain, redness and itching.  Respiratory: Negative for cough, choking and shortness of breath.   Cardiovascular: Negative for chest pain.  Gastrointestinal: Negative for nausea and vomiting.  Musculoskeletal: Positive for arthralgias. Negative for neck pain and neck stiffness.  Neurological: Negative for seizures, syncope and speech difficulty.       Objective:   Physical Exam Filed Vitals:   08/14/15 0948    BP: 129/69  Pulse: 74  Temp: 98.5 F (36.9 C)  Resp: 16  Height: 5\' 2"  (1.575 m)  Weight: 192 lb (87.091 kg)     CONSTITUTIONAL: Well developed/well nourished HEAD: Normocephalic/atraumatic EYES: EOMI/PERRL NECK: supple no meningeal signs SPINE/BACK:entire spine nontender CV: S1/S2 noted, no murmurs/rubs/gallops noted LUNGS: Lungs are clear to auscultation bilaterally, no apparent distress ABDOMEN: soft, nontender, no rebound or guarding, bowel sounds noted throughout abdomen GU:no cva tenderness NEURO: Pt is awake/alert/appropriate, moves all extremitiesx4.  No facial droop.   EXTREMITIES: pulses normal/equal, full ROM Knee: severe degenerative changes of both knees.  SKIN: warm, color normal PSYCH: no abnormalities of mood noted, alert and oriented to situation Results for orders placed or performed in visit on 08/14/15  POCT glycosylated hemoglobin (Hb A1C)  Result Value Ref Range   Hemoglobin A1C 5.7   POCT glucose (manual entry)  Result Value Ref Range   POC Glucose 130 (A) 70 - 99 mg/dl          Assessment & Plan:  Patient is stable. Will check on the status of her diabetes with A1c and glucose. She received a Prevnar 3 months ago. I would feel more comfortable giving her pneumonia 23 vaccine one year after that vaccine. Plan recheck in 4 months. She just received her Prevnar 3 months ago so we are holding off on her pneumococcal vaccine as stated. No change in meds. She is on a statin and Trilipix but has not had issues with muscle discomfort.I personally performed the services described in this documentation, which was scribed in my presence. The recorded information has been reviewed and is accurate.

## 2015-08-25 ENCOUNTER — Other Ambulatory Visit: Payer: Self-pay

## 2015-08-25 MED ORDER — LOSARTAN POTASSIUM 100 MG PO TABS
ORAL_TABLET | ORAL | Status: DC
Start: 2015-08-25 — End: 2016-04-16

## 2015-08-25 MED ORDER — ROSUVASTATIN CALCIUM 20 MG PO TABS: ORAL_TABLET | ORAL | Status: DC

## 2015-09-17 ENCOUNTER — Telehealth: Payer: Self-pay

## 2015-09-17 NOTE — Telephone Encounter (Signed)
Checking with medical records about release. Please advise.

## 2015-09-17 NOTE — Telephone Encounter (Signed)
Patient is requesting her lab results for Dos 05/13/2015 and 08/14/2015 in writing.  Please send to the following address.  [address] [place] [address]

## 2015-09-18 NOTE — Telephone Encounter (Signed)
Verbal request form completed and labs mailed as requested.

## 2015-11-09 ENCOUNTER — Other Ambulatory Visit: Payer: Self-pay | Admitting: Emergency Medicine

## 2015-11-13 ENCOUNTER — Telehealth: Payer: Self-pay | Admitting: Family Medicine

## 2015-11-13 ENCOUNTER — Other Ambulatory Visit: Payer: Self-pay | Admitting: Family Medicine

## 2015-11-13 DIAGNOSIS — E785 Hyperlipidemia, unspecified: Secondary | ICD-10-CM

## 2015-11-13 NOTE — Telephone Encounter (Signed)
Patient returned call.  Dr. Cleta Albertsaub spoke with her and advised her to stay off the trilipid medicine while on the gout meds.  Told her to watch her diet and report any muscle aches. Patient stated that she understood.

## 2015-11-13 NOTE — Telephone Encounter (Signed)
Left a message for patient to return call.  Dr. Cleta Albertsaub wanted us to explain to patient that there could be adverse reactions between her fenobibric acid and probenecid.  So she needs to be careful when taking the gout medicine and report any muscle pains that she might have.

## 2015-11-13 NOTE — Telephone Encounter (Signed)
Got a faxed request for RF of fenofibric, which I pended.  Dr Cleta Albertsaub, wanted to check to make sure you want pt to remain on this med before OKing RF. Pt has appt w/you in April.

## 2015-11-13 NOTE — Addendum Note (Signed)
Addended by: Sheppard PlumberBRIGGS, Kalijah Zeiss A on: 11/13/2015 01:36 PM   Modules accepted: Orders

## 2015-11-15 ENCOUNTER — Emergency Department (HOSPITAL_BASED_OUTPATIENT_CLINIC_OR_DEPARTMENT_OTHER)
Admission: EM | Admit: 2015-11-15 | Discharge: 2015-11-15 | Disposition: A | Discharge status: Home / Self Care | Payer: Medicare Other | Attending: Emergency Medicine | Admitting: Emergency Medicine

## 2015-11-15 ENCOUNTER — Emergency Department (HOSPITAL_BASED_OUTPATIENT_CLINIC_OR_DEPARTMENT_OTHER): Payer: Medicare Other

## 2015-11-15 ENCOUNTER — Encounter (HOSPITAL_BASED_OUTPATIENT_CLINIC_OR_DEPARTMENT_OTHER): Payer: Self-pay

## 2015-11-15 DIAGNOSIS — M109 Gout, unspecified: Secondary | ICD-10-CM | POA: Insufficient documentation

## 2015-11-15 DIAGNOSIS — Z7982 Long term (current) use of aspirin: Secondary | ICD-10-CM | POA: Diagnosis not present

## 2015-11-15 DIAGNOSIS — E7800 Pure hypercholesterolemia, unspecified: Secondary | ICD-10-CM | POA: Diagnosis not present

## 2015-11-15 DIAGNOSIS — M545 Low back pain: Secondary | ICD-10-CM | POA: Insufficient documentation

## 2015-11-15 DIAGNOSIS — I10 Essential (primary) hypertension: Secondary | ICD-10-CM | POA: Diagnosis not present

## 2015-11-15 DIAGNOSIS — E119 Type 2 diabetes mellitus without complications: Secondary | ICD-10-CM | POA: Diagnosis not present

## 2015-11-15 DIAGNOSIS — M79604 Pain in right leg: Secondary | ICD-10-CM | POA: Insufficient documentation

## 2015-11-15 DIAGNOSIS — M25551 Pain in right hip: Secondary | ICD-10-CM | POA: Diagnosis present

## 2015-11-15 DIAGNOSIS — Z7984 Long term (current) use of oral hypoglycemic drugs: Secondary | ICD-10-CM | POA: Diagnosis not present

## 2015-11-15 DIAGNOSIS — M1990 Unspecified osteoarthritis, unspecified site: Secondary | ICD-10-CM | POA: Diagnosis not present

## 2015-11-15 DIAGNOSIS — Z79899 Other long term (current) drug therapy: Secondary | ICD-10-CM | POA: Diagnosis not present

## 2015-11-15 DIAGNOSIS — M5430 Sciatica, unspecified side: Secondary | ICD-10-CM

## 2015-11-15 DIAGNOSIS — Z862 Personal history of diseases of the blood and blood-forming organs and certain disorders involving the immune mechanism: Secondary | ICD-10-CM | POA: Insufficient documentation

## 2015-11-15 DIAGNOSIS — M5431 Sciatica, right side: Secondary | ICD-10-CM | POA: Diagnosis not present

## 2015-11-15 HISTORY — DX: Pure hypercholesterolemia, unspecified: E78.00

## 2015-11-15 HISTORY — DX: Type 2 diabetes mellitus without complications: E11.9

## 2015-11-15 MED ORDER — TRAMADOL HCL 50 MG PO TABS
50.0000 mg | ORAL_TABLET | Freq: Once | ORAL | Status: AC
  Administered 2015-11-15: 50 mg via ORAL
  Filled 2015-11-15: qty 1

## 2015-11-15 MED ORDER — TRAMADOL HCL 50 MG PO TABS: 50.0000 mg | ORAL_TABLET | Freq: Four times a day (QID) | ORAL | Status: DC | PRN

## 2015-11-15 MED ORDER — CYCLOBENZAPRINE HCL 10 MG PO TABS: 5.0000 mg | ORAL_TABLET | Freq: Once | ORAL | Status: DC

## 2015-11-15 NOTE — ED Provider Notes (Signed)
CSN: [id number]     Arrival date & time 11/15/15  1715 History   First MD Initiated Contact with Patient 11/15/15 1734     Chief Complaint  Patient presents with  . Back Pain   HPI   70 year old female presents today with complaints of right hip and leg pain. Patient notes that this morning around 9:30 AM she got up to go to the bathroom was walking and felt sharp pain in her right hip with radiation down the back of her legs to her knee. She reports this pain was severe, shooting in nature, worse with ambulation. She tried lying back in bed which again cause pain. She reports the pain was worsened with movement of the right lower extremity and palpation of the right lateral and posterior hip and buttocks. Patient notes that the pain has stayed persistent with only relieving factor of laying on her left side and putting a pillow between her knees. Patient reports that she has had right hip pain similar to this with walking, but this is not chronic, and not persistent. Patient notes the pain radiates with movement, she denies any loss of distal sensation strength or motor function. Patient reports she is able to ambulate but this causes more pain so has been avoiding it. She reports taking Aleve at home which did not provide a significant improvement in her pain symptoms. Patient notes that 4 days ago she was lifting heavy wood, did not have any pain after this incident. Patient also notes that last night she spent several hours on hard bleacher sitting, again no pain after this. Patient has a history of osteoarthritis in her knees, no history of chronic back pain.    Past Medical History  Diagnosis Date  . Arthritis   . Anemia   . Osteoporosis   . Hypertension   . Gout   . High cholesterol   . Diabetes mellitus without complication [facility](HCC)    Past Surgical History  Procedure Laterality Date  . Abdominal hysterectomy    . Tubal ligation     Family History  Problem Relation Age of Onset  .  Heart disease Mother   . Stroke Mother   . Hypertension Mother   . Stroke Father   . Cancer Sister   . Cancer Son   . Stroke Paternal Grandmother   . Cancer Paternal Grandmother   . Stroke Maternal Grandmother   . Cancer Paternal Grandfather    Social History  Substance Use Topics  . Smoking status: Never Smoker   . Smokeless tobacco: None  . Alcohol Use: No   OB History    No data available     Review of Systems  All other systems reviewed and are negative.   Allergies  Review of patient's allergies indicates no active allergies.  Home Medications   Prior to Admission medications   Medication Sig Start Date End Date Taking? Authorizing Provider  Choline Fenofibrate (TRILIPIX) 135 MG capsule Take 1 capsule (135 mg total) by mouth daily. 06/04/15 06/16/16 Yes Collene GobbleSteven A Daub, MD  aspirin EC 81 MG tablet Take 81 mg by mouth daily.    Historical Provider, MD  levothyroxine (SYNTHROID, LEVOTHROID) 75 MCG tablet TAKE 1 TABLET DAILY 11/14/15   Collene GobbleSteven A Daub, MD  losartan (COZAAR) 100 MG tablet TAKE 1 TABLET DAILY 08/25/15   Collene GobbleSteven A Daub, MD  metFORMIN (GLUCOPHAGE) 500 MG tablet Take 1 tablet (500 mg total) by mouth 2 (two) times daily with a meal. 05/13/15  Collene Gobble, MD  metoprolol tartrate (LOPRESSOR) 25 MG tablet Take 1 tablet (25 mg total) by mouth 2 (two) times daily. 06/04/15 06/16/16  Collene Gobble, MD  Multiple Vitamin (MULTIVITAMIN) tablet Take 1 tablet by mouth daily.    Historical Provider, MD  OVER THE COUNTER MEDICATION OTC Vit D 1400 mg daily    Historical Provider, MD  probenecid (BENEMID) 500 MG tablet Take 500 mg by mouth 2 (two) times daily.    Historical Provider, MD  rosuvastatin (CRESTOR) 20 MG tablet TAKE 1 TABLET DAILY 08/25/15   Collene Gobble, MD  traMADol (ULTRAM) 50 MG tablet Take 1 tablet (50 mg total) by mouth every 6 (six) hours as needed. 11/15/15   Liahm Grivas, PA-C   BP 143/68 mmHg  Pulse 80  Temp(Src) 97.9 F (36.6 C) (Oral)  Resp 18  Ht 5'  3" (1.6 m)  Wt 83.915 kg  BMI 32.78 kg/m2  SpO2 98%   Physical Exam  Constitutional: She is oriented to person, place, and time. She appears well-developed and well-nourished. No distress.  HENT:  Head: Normocephalic and atraumatic.  Eyes: Conjunctivae are normal. Pupils are equal, round, and reactive to light. Right eye exhibits no discharge. Left eye exhibits no discharge. No scleral icterus.  Neck: Normal range of motion. Neck supple. No JVD present. No tracheal deviation present.  Pulmonary/Chest: Effort normal. No stridor.  Musculoskeletal: Normal range of motion. She exhibits tenderness. She exhibits no edema.  No C, T, or L spine tenderness to palpation. No obvious signs of trauma, deformity, infection, step-offs. Lung expansion normal. No scoliosis or kyphosis. Bilateral lower extremity strength 5 out of 5, sensation grossly intact, patellar reflexes 2+, pedal pulses 2+, Refill less than 3 seconds.  Hip is nontender to palpation, full active pain free ROM of right hip, no warmth to touch or redness noted   Straight leg negative Ambulates with antalgic gait   Neurological: She is alert and oriented to person, place, and time. Coordination normal.  Skin: Skin is warm and dry. She is not diaphoretic.  Psychiatric: She has a normal mood and affect. Her behavior is normal. Judgment and thought content normal.  Nursing note and vitals reviewed.   ED Course  Procedures (including critical care time) Labs Review Labs Reviewed - No data to display  Imaging Review Dg Lumbar Spine Complete  11/15/2015  CLINICAL DATA:  Severe low back pain. This radiates to the RIGHT hip and leg. EXAM: LUMBAR SPINE - COMPLETE 4+ VIEW COMPARISON:  None. FINDINGS: There is no evidence of lumbar spine fracture. Alignment is normal. Intervertebral disc spaces are maintained except for slight narrowing at L5-S1. Lower lumbar facet arthropathy. Vascular calcification. IMPRESSION: No acute findings are evident.  Mild disc space narrowing is accompanied by lower lumbar facet arthropathy. Electronically Signed   By: Elsie Stain M.D.   On: 11/15/2015 18:19   Dg Hip Unilat With Pelvis 2-3 Views Right  11/15/2015  CLINICAL DATA:  Severe back pain radiating to the right hip and leg today. No known injury. EXAM: DG HIP (WITH OR WITHOUT PELVIS) 2-3V RIGHT COMPARISON:  None. FINDINGS: Degenerative changes in the lower lumbar spine and hips. Pelvis and right hip appear intact. No evidence of acute fracture or dislocation. No focal bone lesion or bone destruction. SI joints and symphysis pubis are nondisplaced. IMPRESSION: Degenerative changes in the lower lumbar spine and hips. No acute bony abnormalities. Electronically Signed   By: Burman Nieves M.D.   On: 11/15/2015  18:16   I have personally reviewed and evaluated these images and lab results as part of my medical decision-making.   EKG Interpretation None      MDM   Final diagnoses:  Sciatic leg pain    Labs:DG hip, DG lumbar  Imaging:  Consults:  Therapeutics:Ultram  Discharge Meds: Ultram  Assessment/Plan:70 year old female presents today with likely sciatic pain. Patient's pain is reproducible with ambulation and certain movements of her lower extremity. This pain radiates down into her leg can be relieved with positioning. Patient has no signs of infectious etiology on exam, full active range of motion of the hip, low suspicion for septic arthritis, gouty arthritis. This is likely an acute flare, patient reports symptoms have party improved since this morning. She has no significant findings on basic imaging that would indicate metastatic or acute skeletal abnormality. Patient will be discharged home with above medications, encouraged to rest, follow-up with her primary care if symptoms persist, return the ED if they worsen. Patient verbalized understanding and agreement today's plan had no further questions or concerns at time of  discharge        Eyvonne Mechanic, PA-C 11/15/15 1929  Nelva Nay, MD 11/19/15 [phone]

## 2015-11-15 NOTE — ED Notes (Signed)
Patient reports that she awoke this am with severe pain running from buttocks down right leg.  denis trauma. Pain with any ambulation

## 2015-11-17 ENCOUNTER — Telehealth: Payer: Self-pay

## 2015-11-17 NOTE — Telephone Encounter (Signed)
Pt states she is having a f/u with Dr Cleta Albertsaub but is in need of her ULTRAM 50 MG. Please call [phone]   [facility] [address]

## 2015-11-18 ENCOUNTER — Other Ambulatory Visit: Payer: Self-pay | Admitting: Emergency Medicine

## 2015-11-18 MED ORDER — TRAMADOL HCL 50 MG PO TABS: 50.0000 mg | ORAL_TABLET | Freq: Four times a day (QID) | ORAL | Status: DC | PRN

## 2015-11-19 ENCOUNTER — Telehealth: Payer: Self-pay

## 2015-11-19 NOTE — Telephone Encounter (Signed)
Ultram rx faxed

## 2015-11-20 ENCOUNTER — Ambulatory Visit (INDEPENDENT_AMBULATORY_CARE_PROVIDER_SITE_OTHER): Payer: Medicare Other | Admitting: Emergency Medicine

## 2015-11-20 ENCOUNTER — Encounter: Payer: Self-pay | Admitting: Emergency Medicine

## 2015-11-20 VITALS — BP 126/72 | HR 67 | Temp 97.7°F | Resp 16 | Wt 178.4 lb

## 2015-11-20 DIAGNOSIS — M5416 Radiculopathy, lumbar region: Secondary | ICD-10-CM | POA: Diagnosis not present

## 2015-11-20 DIAGNOSIS — E119 Type 2 diabetes mellitus without complications: Secondary | ICD-10-CM | POA: Diagnosis not present

## 2015-11-20 MED ORDER — TRAMADOL HCL 50 MG PO TABS: ORAL_TABLET | ORAL | Status: DC

## 2015-11-20 MED ORDER — GABAPENTIN 100 MG PO CAPS: ORAL_CAPSULE | ORAL | Status: DC

## 2015-11-20 NOTE — Progress Notes (Signed)
Patient ID: Isabella Dixon, female   DOB: 01-19-1946, 70 y.o.   MRN: [id number]    By signing my name below, I, [name], attest that this documentation has been prepared under the direction and in the presence of Collene Gobble, MD Electronically Signed: [name], ED Scribe 11/20/2015 at 3:25 PM.  Chief Complaint:  Chief Complaint  Patient presents with  . Lower back pain    "runs from lower back down right leg, started Fri., 11/14/15, at ball game"   HPI: Isabella Dixon is a 70 y.o. female who reports to [facility] today complaining of sudden onset of constant low back pain onset 5 days ago. Pt states that she was sitting on metal bleachers 6 days ago at a ball game. She state that she woke up fine the next morning but when she woke up from a nap a few hours later, she noticed sudden onset of low back pain. Pt states that pain radiates down the posterior right buttock and into her right heel. Pain is worsened with standing, ambulating and palpation; improved with lying on her left side with a pillow between her knees. She was seen at [facility] following initial onset of back pain 5 days ago; XRs were obtained that showed no fracture and she was discharged with tramadol. Pt states that she has been compliant with medications and has taken Aleve x2 daily with temporary relief. Pt denies bladder/bowel incontinence, saddle anesthesia. No h/o chronic back pain.   Past Medical History  Diagnosis Date  . Arthritis   . Anemia   . Osteoporosis   . Hypertension   . Gout   . High cholesterol   . Diabetes mellitus without complication [facility])    Past Surgical History  Procedure Laterality Date  . Abdominal hysterectomy    . Tubal ligation     Social History   Social History  . Marital Status: Married    Spouse Name: N/A  . Number of Children: N/A  . Years of Education: N/A   Social History Main Topics  . Smoking status: Never Smoker   . Smokeless tobacco: Not on file  .  Alcohol Use: No  . Drug Use: No  . Sexual Activity: Yes   Other Topics Concern  . Not on file   Social History Narrative   Family History  Problem Relation Age of Onset  . Heart disease Mother   . Stroke Mother   . Hypertension Mother   . Stroke Father   . Cancer Sister   . Cancer Son   . Stroke Paternal Grandmother   . Cancer Paternal Grandmother   . Stroke Maternal Grandmother   . Cancer Paternal Grandfather    No Active Allergies Prior to Admission medications   Medication Sig Start Date End Date Taking? Authorizing Provider  aspirin EC 81 MG tablet Take 81 mg by mouth daily.    Historical Provider, MD  Choline Fenofibrate (TRILIPIX) 135 MG capsule Take 1 capsule (135 mg total) by mouth daily. 06/04/15 06/16/16  Collene Gobble, MD  levothyroxine (SYNTHROID, LEVOTHROID) 75 MCG tablet TAKE 1 TABLET DAILY 11/14/15   Collene Gobble, MD  losartan (COZAAR) 100 MG tablet TAKE 1 TABLET DAILY 08/25/15   Collene Gobble, MD  metFORMIN (GLUCOPHAGE) 500 MG tablet Take 1 tablet (500 mg total) by mouth 2 (two) times daily with a meal. 05/13/15   Collene Gobble, MD  metoprolol tartrate (LOPRESSOR) 25 MG tablet Take 1 tablet (25 mg total)  by mouth 2 (two) times daily. 06/04/15 06/16/16  Collene GobbleSteven A Heylee Tant, MD  Multiple Vitamin (MULTIVITAMIN) tablet Take 1 tablet by mouth daily.    Historical Provider, MD  OVER THE COUNTER MEDICATION OTC Vit D 1400 mg daily    Historical Provider, MD  probenecid (BENEMID) 500 MG tablet Take 500 mg by mouth 2 (two) times daily.    Historical Provider, MD  rosuvastatin (CRESTOR) 20 MG tablet TAKE 1 TABLET DAILY 08/25/15   Collene GobbleSteven A Sosaia Pittinger, MD  traMADol (ULTRAM) 50 MG tablet Take 1 tablet (50 mg total) by mouth every 6 (six) hours as needed. 11/18/15   Collene GobbleSteven A Addie Alonge, MD   ROS: The patient denies fevers, chills, night sweats, unintentional weight loss, chest pain, palpitations, wheezing, dyspnea on exertion, nausea, vomiting, abdominal pain, dysuria, hematuria, melena, -numbness,  weakness, or tingling. +back pain  All other systems have been reviewed and were otherwise negative with the exception of those mentioned in the HPI and as above.    PHYSICAL EXAM: Filed Vitals:   11/20/15 1509  BP: 126/72  Pulse: 67  Temp: 97.7 F (36.5 C)  Resp: 16   Body mass index is 31.61 kg/(m^2).  General: Alert, no acute distress HEENT:  Normocephalic, atraumatic, oropharynx patent. [facility]OMI[facility] Cardiovascular: Regular rate and rhythm, no rubs murmurs or gallops. No Carotid bruits, radial pulse intact. No pedal edema.  Respiratory: Clear to auscultation bilaterally. No wheezes, rales, or rhonchi. No cyanosis, no use of accessory musculature Abdominal: No organomegaly, abdomen is soft and non-tender, positive bowel sounds. No masses. Musculoskeletal: Unable to ambulate without maximum assistance. In exam room she is laying on her L side with knees flexed and a pillow between her knees. Severe tenderness in the sciatic area R buttock. DTRs knees 3+, L ankle 3+, R ankle 1+, questionable weakness R great toe.  Skin: No rashes. Neurologic: Facial musculature symmetric. Psychiatric: Patient acts appropriately throughout our interaction. Lymphatic: No cervical or submandibular lymphadenopathy  LABS:  EKG/XRAY:   [facility]  ASSESSMENT/PLAN:  patient having severe right-sided leg pain and sciatica. She has decreased right ankle reflex and weakness right toe extensor. She is in excruciating pain. She will take Ultram one every 4 hours. She will try Neurontin 100 mg 13 times a day to increase to 23 times a day. She can take Aleve 1 twice a day. MRI scheduled for tomorrow at 2:30.I personally performed the services described in this documentation, which was scribed in my presence. The recorded information has been reviewed and is accurate.  Gross sideeffects, risk and benefits, and alternatives of medications d/w patient. Patient is aware that  all medications have potential sideeffects and we are unable to predict every sideeffect or drug-drug interaction that may occur.  Lesle ChrisSteven Zechariah Bissonnette MD 11/20/2015 3:06 PM

## 2015-11-20 NOTE — Patient Instructions (Addendum)
Sciatica Sciatica is pain, weakness, numbness, or tingling along the path of the sciatic nerve. The nerve starts in the lower back and runs down the back of each leg. The nerve controls the muscles in the lower leg and in the back of the knee, while also providing sensation to the back of the thigh, lower leg, and the sole of your foot. Sciatica is a symptom of another medical condition. For instance, nerve damage or certain conditions, such as a herniated disk or bone spur on the spine, pinch or put pressure on the sciatic nerve. This causes the pain, weakness, or other sensations normally associated with sciatica. Generally, sciatica only affects one side of the body. CAUSES   Herniated or slipped disc.  Degenerative disk disease.  A pain disorder involving the narrow muscle in the buttocks (piriformis syndrome).  Pelvic injury or fracture.  Pregnancy.  Tumor (rare). SYMPTOMS  Symptoms can vary from mild to very severe. The symptoms usually travel from the low back to the buttocks and down the back of the leg. Symptoms can include:  Mild tingling or dull aches in the lower back, leg, or hip.  Numbness in the back of the calf or sole of the foot.  Burning sensations in the lower back, leg, or hip.  Sharp pains in the lower back, leg, or hip.  Leg weakness.  Severe back pain inhibiting movement. These symptoms may get worse with coughing, sneezing, laughing, or prolonged sitting or standing. Also, being overweight may worsen symptoms. DIAGNOSIS  Your caregiver will perform a physical exam to look for common symptoms of sciatica. He or she may ask you to do certain movements or activities that would trigger sciatic nerve pain. Other tests may be performed to find the cause of the sciatica. These may include:  Blood tests.  X-rays.  Imaging tests, such as an MRI or CT scan. TREATMENT  Treatment is directed at the cause of the sciatic pain. Sometimes, treatment is not necessary  and the pain and discomfort goes away on its own. If treatment is needed, your caregiver may suggest:  Over-the-counter medicines to relieve pain.  Prescription medicines, such as anti-inflammatory medicine, muscle relaxants, or narcotics.  Applying heat or ice to the painful area.  Steroid injections to lessen pain, irritation, and inflammation around the nerve.  Reducing activity during periods of pain.  Exercising and stretching to strengthen your abdomen and improve flexibility of your spine. Your caregiver may suggest losing weight if the extra weight makes the back pain worse.  Physical therapy.  Surgery to eliminate what is pressing or pinching the nerve, such as a bone spur or part of a herniated disk. HOME CARE INSTRUCTIONS   Only take over-the-counter or prescription medicines for pain or discomfort as directed by your caregiver.  Apply ice to the affected area for 20 minutes, 3-4 times a day for the first 48-72 hours. Then try heat in the same way.  Exercise, stretch, or perform your usual activities if these do not aggravate your pain.  Attend physical therapy sessions as directed by your caregiver.  Keep all follow-up appointments as directed by your caregiver.  Do not wear high heels or shoes that do not provide proper support.  Check your mattress to see if it is too soft. A firm mattress may lessen your pain and discomfort. SEEK IMMEDIATE MEDICAL CARE IF:   You lose control of your bowel or bladder (incontinence).  You have increasing weakness in the lower back, pelvis, buttocks,   or legs.  You have redness or swelling of your back.  You have a burning sensation when you urinate.  You have pain that gets worse when you lie down or awakens you at night.  Your pain is worse than you have experienced in the past.  Your pain is lasting longer than 4 weeks.  You are suddenly losing weight without reason. MAKE SURE YOU:  Understand these  instructions.  Will watch your condition.  Will get help right away if you are not doing well or get worse.   This information is not intended to replace advice given to you by your health care provider. Make sure you discuss any questions you have with your health care provider.   Document Released: 08/03/2001 Document Revised: 04/30/2015 Document Reviewed: 12/19/2011 Elsevier Interactive Patient Education 2016 [employer]     Arrive at 2:15 for a 2:30 appointment at [facility]   Imaging.  If you need to cancel this appointment for any reason, please call them at [phone]

## 2015-11-21 ENCOUNTER — Ambulatory Visit
Admission: RE | Admit: 2015-11-21 | Discharge: 2015-11-21 | Disposition: A | Discharge status: Home / Self Care | Payer: Medicare Other | Source: Ambulatory Visit | Attending: Emergency Medicine | Admitting: Emergency Medicine

## 2015-11-21 ENCOUNTER — Other Ambulatory Visit: Payer: Self-pay

## 2015-11-21 DIAGNOSIS — M5416 Radiculopathy, lumbar region: Secondary | ICD-10-CM

## 2015-11-21 DIAGNOSIS — E785 Hyperlipidemia, unspecified: Secondary | ICD-10-CM

## 2015-11-21 MED ORDER — FENOFIBRIC ACID 135 MG PO CPDR: 135.0000 mg | DELAYED_RELEASE_CAPSULE | Freq: Every day | ORAL | Status: DC

## 2015-12-11 ENCOUNTER — Ambulatory Visit (INDEPENDENT_AMBULATORY_CARE_PROVIDER_SITE_OTHER): Payer: Medicare Other | Admitting: Emergency Medicine

## 2015-12-11 ENCOUNTER — Encounter: Payer: Self-pay | Admitting: Emergency Medicine

## 2015-12-11 VITALS — BP 116/84 | HR 68 | Temp 98.2°F | Resp 16 | Ht 62.0 in | Wt 177.2 lb

## 2015-12-11 DIAGNOSIS — I10 Essential (primary) hypertension: Secondary | ICD-10-CM

## 2015-12-11 DIAGNOSIS — M5416 Radiculopathy, lumbar region: Secondary | ICD-10-CM

## 2015-12-11 DIAGNOSIS — E119 Type 2 diabetes mellitus without complications: Secondary | ICD-10-CM

## 2015-12-11 DIAGNOSIS — R739 Hyperglycemia, unspecified: Secondary | ICD-10-CM

## 2015-12-11 DIAGNOSIS — E038 Other specified hypothyroidism: Secondary | ICD-10-CM | POA: Diagnosis not present

## 2015-12-11 DIAGNOSIS — E785 Hyperlipidemia, unspecified: Secondary | ICD-10-CM

## 2015-12-11 LAB — CBC WITH DIFFERENTIAL/PLATELET
BASOS ABS: 52 {cells}/uL (ref 0–200)
Basophils Relative: 1 %
EOS PCT: 4 %
Eosinophils Absolute: 208 cells/uL (ref 15–500)
HCT: 40.5 % (ref 35.0–45.0)
Hemoglobin: 13.7 g/dL (ref 11.7–15.5)
LYMPHS PCT: 37 %
Lymphs Abs: 1924 cells/uL (ref 850–3900)
MCH: 30.6 pg (ref 27.0–33.0)
MCHC: 33.8 g/dL (ref 32.0–36.0)
MCV: 90.6 fL (ref 80.0–100.0)
MONOS PCT: 12 %
MPV: 10 fL (ref 7.5–12.5)
Monocytes Absolute: 624 cells/uL (ref 200–950)
NEUTROS PCT: 46 %
Neutro Abs: 2392 cells/uL (ref 1500–7800)
Platelets: 265 10*3/uL (ref 140–400)
RBC: 4.47 MIL/uL (ref 3.80–5.10)
RDW: 13.4 % (ref 11.0–15.0)
WBC: 5.2 10*3/uL (ref 3.8–10.8)

## 2015-12-11 LAB — COMPLETE METABOLIC PANEL WITH GFR
ALBUMIN: 4.1 g/dL (ref 3.6–5.1)
ALK PHOS: 51 U/L (ref 33–130)
ALT: 41 U/L — ABNORMAL HIGH (ref 6–29)
AST: 36 U/L — ABNORMAL HIGH (ref 10–35)
BUN: 20 mg/dL (ref 7–25)
CHLORIDE: 102 mmol/L (ref 98–110)
CO2: 25 mmol/L (ref 20–31)
Calcium: 9.1 mg/dL (ref 8.6–10.4)
Creat: 0.76 mg/dL (ref 0.50–0.99)
GFR, EST NON AFRICAN AMERICAN: 81 mL/min (ref >=60)
GFR, Est African American: >89 mL/min (ref >=60)
GLUCOSE: 96 mg/dL (ref 65–99)
POTASSIUM: 4.1 mmol/L (ref 3.5–5.3)
SODIUM: 139 mmol/L (ref 135–146)
Total Bilirubin: 0.4 mg/dL (ref 0.2–1.2)
Total Protein: 7.7 g/dL (ref 6.1–8.1)

## 2015-12-11 LAB — LIPID PANEL
CHOL/HDL RATIO: 2.6 ratio (ref <=5.0)
Cholesterol: 100 mg/dL — ABNORMAL LOW (ref 125–200)
HDL: 39 mg/dL — AB (ref >=46)
LDL Cholesterol: 31 mg/dL (ref <130)
Triglycerides: 150 mg/dL — ABNORMAL HIGH (ref <150)
VLDL: 30 mg/dL (ref <30)

## 2015-12-11 LAB — HEMOGLOBIN A1C
Hgb A1c MFr Bld: 5.7 % — ABNORMAL HIGH (ref <5.7)
MEAN PLASMA GLUCOSE: 117 mg/dL

## 2015-12-11 LAB — TSH: TSH: 1.39 m[IU]/L

## 2015-12-11 MED ORDER — GABAPENTIN 100 MG PO CAPS: ORAL_CAPSULE | ORAL | Status: DC

## 2015-12-11 MED ORDER — LEVOTHYROXINE SODIUM 75 MCG PO TABS: 75.0000 ug | ORAL_TABLET | Freq: Every day | ORAL | Status: DC

## 2015-12-11 MED ORDER — METFORMIN HCL 500 MG PO TABS: 500.0000 mg | ORAL_TABLET | Freq: Two times a day (BID) | ORAL | Status: DC

## 2015-12-11 MED ORDER — TRAMADOL HCL 50 MG PO TABS
ORAL_TABLET | ORAL | Status: DC
Start: 2015-12-11 — End: 2017-02-11

## 2015-12-11 NOTE — Patient Instructions (Addendum)
IF you received an x-ray today, you will receive an invoice from [facility]. Please contact [facility] at [phone] with questions or concerns regarding your invoice.   IF you received labwork today, you will receive an invoice from [employer]. Please contact [facility] at [phone] with questions or concerns regarding your invoice.   Our billing staff will not be able to assist you with questions regarding bills from these companies.  You will be contacted with the lab results as soon as they are available. The fastest way to get your results is to activate your My Chart account. Instructions are located on the last page of this paperwork. If you have not heard from [place] regarding the results in 2 weeks, please contact this office.    sciaticaRadicular Pain Radicular pain in either the arm or leg is usually from a bulging or herniated disk in the spine. A piece of the herniated disk may press against the nerves as the nerves exit the spine. This causes pain which is felt at the tips of the nerves down the arm or leg. Other causes of radicular pain may include:  Fractures.  Heart disease.  Cancer.  An abnormal and usually degenerative state of the nervous system or nerves (neuropathy). Diagnosis may require CT or MRI scanning to determine the primary cause.  Nerves that start at the neck (nerve roots) may cause radicular pain in the outer shoulder and arm. It can spread down to the thumb and fingers. The symptoms vary depending on which nerve root has been affected. In most cases radicular pain improves with conservative treatment. Neck problems may require physical therapy, a neck collar, or cervical traction. Treatment may take many weeks, and surgery may be considered if the symptoms do not improve.  Conservative treatment is also recommended for sciatica. Sciatica causes pain to radiate from the lower back or buttock area down the leg  into the foot. Often there is a history of back problems. Most patients with sciatica are better after 2 to 4 weeks of rest and other supportive care. Short term bed rest can reduce the disk pressure considerably. Sitting, however, is not a good position since this increases the pressure on the disk. You should avoid bending, lifting, and all other activities which make the problem worse. Traction can be used in severe cases. Surgery is usually reserved for patients who do not improve within the first months of treatment. Only take over-the-counter or prescription medicines for pain, discomfort, or fever as directed by your caregiver. Narcotics and muscle relaxants may help by relieving more severe pain and spasm and by providing mild sedation. Cold or massage can give significant relief. Spinal manipulation is not recommended. It can increase the degree of disc protrusion. Epidural steroid injections are often effective treatment for radicular pain. These injections deliver medicine to the spinal nerve in the space between the protective covering of the spinal cord and back bones (vertebrae). Your caregiver can give you more information about steroid injections. These injections are most effective when given within two weeks of the onset of pain.  You should see your caregiver for follow up care as recommended. A program for neck and back injury rehabilitation with stretching and strengthening exercises is an important part of management.  SEEK IMMEDIATE MEDICAL CARE IF:  You develop increased pain, weakness, or numbness in your arm or leg.  You develop difficulty with bladder or bowel control.  You develop abdominal pain.  This information is not intended to replace advice given to you by your health care provider. Make sure you discuss any questions you have with your health care provider.   Document Released: 09/16/2004 Document Revised: 08/30/2014 Document Reviewed: 03/05/2015 Elsevier  Interactive Patient Education [employer]2016 Elsevier Inc.

## 2015-12-11 NOTE — Progress Notes (Signed)
Patient ID: Isabella Dixon, female   DOB: 21-May-1946, 70 y.o.   MRN: [id number]     By signing my name below, I, Littie Deedsichard Sun, attest that this documentation has been prepared under the direction and in the presence of Lesle ChrisSteven Jalani Cullifer, MD.  Electronically Signed: Littie Deedsichard Sun, Medical Scribe. 12/11/2015. 9:02 AM.   Chief Complaint:  Chief Complaint  Patient presents with  . Follow-up  . Diabetes  . Hypertension  . Medication Refill    HPI: Isabella Dixon is a 70 y.o. female with a history of DM, HTN, HLD, and hypothyroid who [facility]. Her back pain has improved some, but she still relies a cane to ambulate. She still has pain in her buttocks and right foot. Patient has seen Dr. Lovell SheehanJenkins, who believes she has some nerve inflammation. Her next appointment with Dr. Lovell SheehanJenkins is on June 30th. She is currently taking gabapentin 100 mg, 2 pills, 3 times a day. She finished the prednisone this past weekend. She has also been taking 1-2 Aleve a day. Patient needs refills for tramadol, levothyroxine, and metformin.  Past Medical History  Diagnosis Date  . Arthritis   . Anemia   . Osteoporosis   . Hypertension   . Gout   . High cholesterol   . Diabetes mellitus without complication [facility](HCC)    Past Surgical History  Procedure Laterality Date  . Abdominal hysterectomy    . Tubal ligation     Social History   Social History  . Marital Status: Married    Spouse Name: N/A  . Number of Children: N/A  . Years of Education: N/A   Social History Main Topics  . Smoking status: Never Smoker   . Smokeless tobacco: None  . Alcohol Use: No  . Drug Use: No  . Sexual Activity: Yes   Other Topics Concern  . None   Social History Narrative   Family History  Problem Relation Age of Onset  . Heart disease Mother   . Stroke Mother   . Hypertension Mother   . Stroke Father   . Cancer Sister   . Cancer Son   . Stroke Paternal Grandmother   . Cancer Paternal  Grandmother   . Stroke Maternal Grandmother   . Cancer Paternal Grandfather    No Known Allergies Prior to Admission medications   Medication Sig Start Date End Date Taking? Authorizing Provider  aspirin EC 81 MG tablet Take 81 mg by mouth daily.    Historical Provider, MD  Choline Fenofibrate (FENOFIBRIC ACID) 135 MG CPDR Take 135 mg by mouth daily. 11/21/15   Collene GobbleSteven A Kyrollos Cordell, MD  gabapentin (NEURONTIN) 100 MG capsule Take one tablet 3 times a day for pain if tolerated can increase to 2 tablets 3 times a day 11/20/15   Collene GobbleSteven A Rekha Hobbins, MD  levothyroxine (SYNTHROID, LEVOTHROID) 75 MCG tablet TAKE 1 TABLET DAILY 11/14/15   Collene GobbleSteven A Nishtha Raider, MD  losartan (COZAAR) 100 MG tablet TAKE 1 TABLET DAILY 08/25/15   Collene GobbleSteven A Madhuri Vacca, MD  metFORMIN (GLUCOPHAGE) 500 MG tablet Take 1 tablet (500 mg total) by mouth 2 (two) times daily with a meal. 05/13/15   Collene GobbleSteven A Rosamae Rocque, MD  metoprolol tartrate (LOPRESSOR) 25 MG tablet Take 1 tablet (25 mg total) by mouth 2 (two) times daily. 06/04/15 06/16/16  Collene GobbleSteven A Henley Boettner, MD  Multiple Vitamin (MULTIVITAMIN) tablet Take 1 tablet by mouth daily.    Historical Provider, MD  Naproxen Sodium (ALEVE PO) Take  by mouth.    Historical Provider, MD  OVER THE COUNTER MEDICATION OTC Vit D 1400 mg daily    Historical Provider, MD  probenecid (BENEMID) 500 MG tablet Take 500 mg by mouth 2 (two) times daily.    Historical Provider, MD  rosuvastatin (CRESTOR) 20 MG tablet TAKE 1 TABLET DAILY 08/25/15   Collene Gobble, MD  traMADol Janean Sark) 50 MG tablet Take 1 tablet every 4 hours as needed for pain 11/20/15   Collene Gobble, MD     ROS: The patient denies fevers, chills, night sweats, unintentional weight loss, chest pain, palpitations, wheezing, dyspnea on exertion, nausea, vomiting, abdominal pain, dysuria, hematuria, melena.  All other systems have been reviewed and were otherwise negative with the exception of those mentioned in the HPI and as above.    PHYSICAL EXAM: Filed Vitals:   12/11/15  0846 12/11/15 0852  BP: 120/90 116/84  Pulse: 68   Temp: 98.2 F (36.8 C)   Resp: 16    Body mass index is 32.4 kg/(m^2).   General: Alert, no acute distress HEENT:  Normocephalic, atraumatic, oropharynx patent. Eye: Nonie Hoyer [facility] Cardiovascular:  Regular rate and rhythm, no rubs murmurs or gallops.  No Carotid bruits, radial pulse intact. No pedal edema.  Respiratory: Clear to auscultation bilaterally.  No wheezes, rales, or rhonchi.  No cyanosis, no use of accessory musculature Abdominal: No organomegaly, abdomen is soft and non-tender, positive bowel sounds.  No masses. Musculoskeletal: Gait intact. No edema. Tenderness right buttocks. She still walks with the assistance of a cane. Skin: No rashes. Neurologic: Facial musculature symmetric. Her reflexes and strength lower extremities are normal.  Psychiatric: Patient acts appropriately throughout our interaction. Lymphatic: No cervical or submandibular lymphadenopathy    LABS:  Results for orders placed or performed in visit on 08/14/15  Microalbumin, urine  Result Value Ref Range   Microalb, Ur 0.4 Not estab mg/dL  POCT glycosylated hemoglobin (Hb A1C)  Result Value Ref Range   Hemoglobin A1C 5.7   POCT glucose (manual entry)  Result Value Ref Range   POC Glucose 130 (A) 70 - 99 mg/dl    EKG/XRAY:   Primary read interpreted by Dr. Cleta Alberts at [facility].   ASSESSMENT/PLAN: All meds refilled. I increased her Neurontin to 3 capsules for dose of 300 mg up to 3 times a day. I refilled her Ultram. She has a follow-up appointment with Dr. Lovell Sheehan. Her other meds were refilled.I personally performed the services described in this documentation, which was scribed in my presence. The recorded information has been reviewed and is accurate.    Gross sideeffects, risk and benefits, and alternatives of medications d/w patient. Patient is aware that all medications have potential sideeffects and we are unable to predict every sideeffect or  drug-drug interaction that may occur.  Lesle Chris MD 12/11/2015 9:02 AM

## 2016-01-02 ENCOUNTER — Other Ambulatory Visit: Payer: Self-pay | Admitting: Emergency Medicine

## 2016-01-27 ENCOUNTER — Other Ambulatory Visit (INDEPENDENT_AMBULATORY_CARE_PROVIDER_SITE_OTHER): Payer: Medicare Other

## 2016-01-27 DIAGNOSIS — R748 Abnormal levels of other serum enzymes: Secondary | ICD-10-CM | POA: Diagnosis not present

## 2016-01-27 LAB — COMPREHENSIVE METABOLIC PANEL
ALK PHOS: 82 U/L (ref 33–130)
ALT: 18 U/L (ref 6–29)
AST: 22 U/L (ref 10–35)
Albumin: 4.3 g/dL (ref 3.6–5.1)
BILIRUBIN TOTAL: 0.4 mg/dL (ref 0.2–1.2)
BUN: 14 mg/dL (ref 7–25)
CO2: 24 mmol/L (ref 20–31)
Calcium: 9.5 mg/dL (ref 8.6–10.4)
Chloride: 101 mmol/L (ref 98–110)
Creat: 0.73 mg/dL (ref 0.50–0.99)
Glucose, Bld: 95 mg/dL (ref 65–99)
POTASSIUM: 4.4 mmol/L (ref 3.5–5.3)
Sodium: 139 mmol/L (ref 135–146)
TOTAL PROTEIN: 7.7 g/dL (ref 6.1–8.1)

## 2016-04-16 ENCOUNTER — Other Ambulatory Visit: Payer: Self-pay | Admitting: Emergency Medicine

## 2016-05-17 ENCOUNTER — Ambulatory Visit (INDEPENDENT_AMBULATORY_CARE_PROVIDER_SITE_OTHER): Payer: Medicare Other

## 2016-05-17 DIAGNOSIS — Z23 Encounter for immunization: Secondary | ICD-10-CM

## 2016-12-10 ENCOUNTER — Other Ambulatory Visit: Payer: Self-pay | Admitting: Emergency Medicine

## 2016-12-10 DIAGNOSIS — E119 Type 2 diabetes mellitus without complications: Secondary | ICD-10-CM

## 2016-12-29 ENCOUNTER — Other Ambulatory Visit: Payer: Self-pay | Admitting: Emergency Medicine

## 2016-12-29 DIAGNOSIS — E038 Other specified hypothyroidism: Secondary | ICD-10-CM

## 2016-12-29 NOTE — Telephone Encounter (Signed)
Please notify patient of refill authorization and need for visit + labs/establish with new PCP since Dr. Cleta Albertsaub has retired.  Meds ordered this encounter  Medications  . levothyroxine (SYNTHROID, LEVOTHROID) 75 MCG tablet    Sig: TAKE 1 TABLET DAILY    Dispense:  90 tablet    Refill:  0

## 2017-02-11 ENCOUNTER — Encounter: Payer: Self-pay | Admitting: Family Medicine

## 2017-02-11 ENCOUNTER — Ambulatory Visit (INDEPENDENT_AMBULATORY_CARE_PROVIDER_SITE_OTHER): Payer: Medicare Other | Admitting: Family Medicine

## 2017-02-11 VITALS — BP 139/88 | HR 111 | Temp 99.0°F | Resp 18 | Ht 62.99 in | Wt 175.0 lb

## 2017-02-11 DIAGNOSIS — M5431 Sciatica, right side: Secondary | ICD-10-CM

## 2017-02-11 DIAGNOSIS — I10 Essential (primary) hypertension: Secondary | ICD-10-CM

## 2017-02-11 DIAGNOSIS — R000 Tachycardia, unspecified: Secondary | ICD-10-CM | POA: Diagnosis not present

## 2017-02-11 DIAGNOSIS — E119 Type 2 diabetes mellitus without complications: Secondary | ICD-10-CM

## 2017-02-11 DIAGNOSIS — E785 Hyperlipidemia, unspecified: Secondary | ICD-10-CM

## 2017-02-11 DIAGNOSIS — E038 Other specified hypothyroidism: Secondary | ICD-10-CM | POA: Diagnosis not present

## 2017-02-11 DIAGNOSIS — E039 Hypothyroidism, unspecified: Secondary | ICD-10-CM | POA: Diagnosis not present

## 2017-02-11 DIAGNOSIS — Z8739 Personal history of other diseases of the musculoskeletal system and connective tissue: Secondary | ICD-10-CM | POA: Diagnosis not present

## 2017-02-11 DIAGNOSIS — Z862 Personal history of diseases of the blood and blood-forming organs and certain disorders involving the immune mechanism: Secondary | ICD-10-CM

## 2017-02-11 MED ORDER — LEVOTHYROXINE SODIUM 75 MCG PO TABS: 75.0000 ug | ORAL_TABLET | Freq: Every day | ORAL | 1 refills | Status: DC

## 2017-02-11 MED ORDER — PROBENECID 500 MG PO TABS: 500.0000 mg | ORAL_TABLET | Freq: Two times a day (BID) | ORAL | 2 refills | Status: DC

## 2017-02-11 MED ORDER — ROSUVASTATIN CALCIUM 10 MG PO TABS: 10.0000 mg | ORAL_TABLET | Freq: Every day | ORAL | 1 refills | Status: DC

## 2017-02-11 MED ORDER — METFORMIN HCL 500 MG PO TABS: 500.0000 mg | ORAL_TABLET | Freq: Two times a day (BID) | ORAL | 1 refills | Status: DC

## 2017-02-11 MED ORDER — LOSARTAN POTASSIUM 100 MG PO TABS: 100.0000 mg | ORAL_TABLET | Freq: Every day | ORAL | 1 refills | Status: DC

## 2017-02-11 NOTE — Progress Notes (Signed)
Subjective:  By signing my name below, I, Moises Blood, attest that this documentation has been prepared under the direction and in the presence of Merri Ray, MD. Electronically Signed: Moises Blood, [place]. 02/11/2017 , 3:44 PM .  Patient was seen in Room 10 .   Patient ID: Isabella Dixon, female    DOB: 01-10-1946, 71 y.o.   MRN: [id number] Chief Complaint  Patient presents with  . Annual Exam    wellness   HPI Isabella Dixon is a 71 y.o. female Here for annual wellness exam. She has history of HTN, hyperlipidemia, hypothyroidism, DM and gout. She has not been seen since April 2017 by Dr. Everlene Farrier. At that time, she was being treated for lumbar radiculopathy/sciatica. I'm now listed as her PCP, but this is my first visit with her.   Lumbar radiculopathy/sciatica See prior notes. She was using a cane to ambulate. She was followed by neurosurgeon, Dr. Arnoldo Morale. She was treated with prednisone, gabapentin 361m up to 3 times a day, and ultram refilled by Dr. DEverlene Farrier with plan to follow up with Dr. JArnoldo Morale   She called to have an appointment with Dr. TCharlestine Nighton April 19th and her sciatica was flaring up again. Dr. TCharlestine Nightprescribed her treatment of hydrocodone and prednisone. She states she's having improvement in her back pain. She's had MRI of lumbar spine done in the past. She states she didn't contact Dr. JArnoldo Moralebecause "he didn't do nothing for me last year". She's been taking gabapentin 3052m1 tablet at night. She hasn't had physical therapy for her back pain. She reports limitations in her activity, over the past 4 months, but now is improving as above.  DM She is taking metformin 50059mID. She denies any side effects with this medication.   Lab Results  Component Value Date   HGBA1C 5.7 (H) 12/11/2015   Lab Results  Component Value Date   MICROALBUR 0.4 08/14/2015   Ophtha: She hasn't seen an eye doctor in the past year.  Dentist: She has some crowns,  otherwise natural teeth; last seen in February (4 months ago). She goes every 6 months.   Hypothyroidism Lab Results  Component Value Date   TSH 1.39 12/11/2015   She takes synthroid 28m87mD.   HTN She takes Losartan 100mg65mand metoprolol 25mg 58m   She states she's been out of her metoprolol and Losartan for a while now. She has not been checking BP at home.   Hyperlipidemia She takes fenofibrate 135mg Q42md Crestor 20mg QD35msed on lab work in April 2017, Dr. Daub's iPerfecto Kingdomtions were to take half tablet of Crestor each day.   She states she's been out of her Crestor for a while too. She informs that she hasn't taken her fenofibrate because [employer] [facility] sent a letter to here and Dr. Truslow,Charlestine Nightreacts with colchicine, so Dr. Daub stoEverlene Farrier it.   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   She states she's taking probenecid 500mg 2 ta79ms in the morning, and 1-2  in the evening if she has a flare. She was given names by Dr. Charlestine Night for new rheumatologist to follow her. She informs possible flare up recently in her toe; first flare up in a long time.   Immunizations Immunization History  Administered Date(s) Administered  . Influenza Split 05/02/2012  . Influenza,inj,Quad PF,36+ Mos 05/08/2013, 04/30/2014, 05/13/2015, 05/17/2016  . Pneumococcal Conjugate-13 05/13/2015  . Tdap 03/14/2008   Pneumovax: She Chooses to defer at this time.   Depression Depression screen [facility] 2/9 02/11/2017 12/11/2015 11/20/2015 08/14/2015 05/13/2015  Decreased Interest 0 0 0 0 0  Down, Depressed, Hopeless  0 0 0 0 0  PHQ - 2 Score 0 0 0 0 0   She denies suicidal ideations. Pain from sciatica has affected mood, but denies depression currently.  Vision  Visual Acuity Screening   Right eye Left eye Both eyes  Without correction:     With correction: '20/30 20/30 20/30 '  Hearing Screening Comments: Left Ear: 6 ft Right Ear: 6 ft    Functional Status Survey: Is the patient deaf or have difficulty hearing?: No Does the patient have difficulty seeing, even when wearing glasses/contacts?: Yes Does the patient have difficulty concentrating, remembering, or making decisions?: No Does the patient have difficulty walking or climbing stairs?: Yes Does the patient have difficulty dressing or bathing?: No Does the patient have difficulty doing errands alone such as visiting a doctor's office or shopping?: No  Advanced Directives She declined information today.   Patient Active Problem List   Diagnosis Date Noted  . Diabetes mellitus type 2 in obese ([place]) 05/13/2015  . Hypothyroid 07/25/2012  . Gout 05/02/2012  . Osteoarthritis 05/02/2012  . Hypertension 05/02/2012  . Hyperlipidemia 05/02/2012  . Abnormal pap 05/02/2012  . Hyperglycemia 05/02/2012   Past Medical History:  Diagnosis Date  . Anemia   . Arthritis   . Diabetes mellitus without complication ([place])   . Gout   . High cholesterol   . Hypertension   . Osteoporosis    Past Surgical History:  Procedure Laterality Date  . ABDOMINAL HYSTERECTOMY    . TUBAL LIGATION     No Known Allergies Prior to Admission medications   Medication Sig Start Date End Date Taking? Authorizing Provider  aspirin EC 81 MG tablet Take 81 mg by mouth daily.   Yes [provider]  Choline Fenofibrate (FENOFIBRIC ACID) 135 MG CPDR Take 135 mg by mouth daily. 11/21/15  Yes Darlyne Russian, MD  gabapentin (NEURONTIN) 100 MG capsule Take 2-3 capsules 3 times a day. 12/11/15  Yes Darlyne Russian, MD  levothyroxine (SYNTHROID, LEVOTHROID) 75 MCG tablet  TAKE 1 TABLET DAILY 12/29/16  Yes Jeffery, Chelle, PA-C  losartan (COZAAR) 100 MG tablet TAKE 1 TABLET DAILY 04/17/16  Yes Darlyne Russian, MD  metFORMIN (GLUCOPHAGE) 500 MG tablet TAKE 1 TABLET TWICE A DAY WITH MEALS 12/10/16  Yes Tereasa Coop, PA-C  metoprolol tartrate (LOPRESSOR) 25 MG tablet TAKE 1 TABLET TWICE A DAY 01/05/16  Yes Daub, Loura Back, MD  Multiple Vitamin (MULTIVITAMIN) tablet Take 1 tablet by mouth daily.   Yes [provider]  Naproxen Sodium (ALEVE PO) Take by mouth.   Yes [provider]  OVER THE COUNTER MEDICATION OTC Vit D 1400 mg daily   Yes [provider]  probenecid (BENEMID) 500 MG tablet Take 500 mg by mouth 2 (two) times daily.   Yes [provider]  rosuvastatin (CRESTOR) 20 MG tablet TAKE 1 TABLET DAILY 04/17/16  Yes Darlyne Russian, MD   Social History   Social History  . Marital status: Married    Spouse name: N/A  . Number of children: N/A  . Years of education: N/A   Occupational History  . Not on file.   Social History Main Topics  . Smoking status: Never Smoker  . Smokeless tobacco: Never Used  . Alcohol use No  . Drug use: No  . Sexual activity: Yes   Other Topics Concern  . Not on file   Social History Narrative  . No narrative on file   Review of Systems  Constitutional: Negative for chills, fatigue, fever and unexpected weight change.  Respiratory: Negative for cough.   Cardiovascular: Negative for chest pain and palpitations.  Gastrointestinal: Negative for constipation, diarrhea, nausea and vomiting.  Musculoskeletal: Positive for arthralgias, back pain and gait problem. Negative for joint swelling.  Skin: Negative for rash and wound.  Neurological: Negative for dizziness, weakness and headaches.  Psychiatric/Behavioral: Negative for suicidal ideas.       Objective:   Physical Exam  Constitutional: She is oriented to person, place, and time. She appears well-developed and well-nourished.    HENT:  Head: Normocephalic and atraumatic.  Eyes: Conjunctivae and EOM are normal. Pupils are equal, round, and reactive to light.  Neck: Carotid bruit is not present.  Cardiovascular: Regular rhythm, normal heart sounds and intact distal pulses.  Tachycardia present.   Pulmonary/Chest: Effort normal and breath sounds normal.  Abdominal: Soft. She exhibits no pulsatile midline mass. There is no tenderness.  Neurological: She is alert and oriented to person, place, and time.  Negative seated straight leg raise  Skin: Skin is warm and dry.  Psychiatric: She has a normal mood and affect. Her behavior is normal.  Vitals reviewed.   Vitals:   02/11/17 1440  BP: 139/88  Pulse: (!) 111  Resp: 18  Temp: 99 F (37.2 C)  TempSrc: Oral  SpO2: 98%  Weight: 175 lb (79.4 kg)  Height: 5' 2.99" (1.6 m)   EKG: sinus tachycardia rate 114, non specific T-wave in lead III, otheriwse no apparent significant change from 2014.     Assessment & Plan:    ALAYNNA KERWOOD is a 71 y.o. female Type 2 diabetes mellitus without complication, without long-term current use of insulin ([place]) - Plan: Hemoglobin A1c, metFORMIN (GLUCOPHAGE) 500 MG tablet  - Check A1c, continue metformin same dose. Routine lab work and screening intervals as well as other maintenance items with diabetes were discussed, with handout given. If her A1c drops to normal level, would consider trial off of metformin, with repeat A1c in 3 months.   -Pneumovax recommended, declined.  -Ophthalmology appointment recommended. Names provided.  Hyperlipidemia, unspecified hyperlipidemia type - Plan: Comprehensive metabolic panel, Lipid panel, rosuvastatin (CRESTOR) 10 MG tablet  -Recent tolerating Crestor, lower dose of 10 mg was prescribed. As she has been off this medication recently, expect current levels to be possibly higher than previous. Can recheck levels in next few months.  Other specified hypothyroidism - Plan: levothyroxine  (SYNTHROID, LEVOTHROID) 75 MCG tablet Hypothyroidism, unspecified type - Plan: TSH  - Continue Synthroid same dose, TSH pending. She is tachycardic in office, may need thyroid adjustment.  Right sided sciatica  - Recurrent issue, and significant pain from most recent flare, but now says she is improving after treatment from her rheumatologist. If she is not continuing to improve or has flare pain, would recommend discussing with new rheumatologist, neurosurgeon, or I am  happy to see her in follow-up to review previous MRI, treatments, and options.  History of gout - Plan: probenecid (BENEMID) 500 MG tablet  - Refilled probenecid, recommended contacting new rheumatologist from list provided by her  rheumatologist who has now retired.  Tachycardia - Plan: CBC, EKG 12-Lead  - Sinus tachycardia. Asymptomatic, denies palpitations or chest pain. Check CBC, TSH, follow up in next few weeks to discuss further  Essential hypertension - Plan: losartan (COZAAR) 100 MG tablet  - Restart losartan only as may not need metoprolol, as borderline blood pressure off both medicines. Check CMP.    Meds ordered this encounter  Medications  . losartan (COZAAR) 100 MG tablet    Sig: Take 1 tablet (100 mg total) by mouth daily.    Dispense:  90 tablet    Refill:  1  . rosuvastatin (CRESTOR) 10 MG tablet    Sig: Take 1 tablet (10 mg total) by mouth daily.    Dispense:  90 tablet    Refill:  1  . probenecid (BENEMID) 500 MG tablet    Sig: Take 1 tablet (500 mg total) by mouth 2 (two) times daily. 2 tablets qam, then 1-2 tabs Qpm    Dispense:  120 tablet    Refill:  2  . metFORMIN (GLUCOPHAGE) 500 MG tablet    Sig: Take 1 tablet (500 mg total) by mouth 2 (two) times daily with a meal.    Dispense:  180 tablet    Refill:  1  . levothyroxine (SYNTHROID, LEVOTHROID) 75 MCG tablet    Sig: Take 1 tablet (75 mcg total) by mouth daily.    Dispense:  90 tablet    Refill:  1   Patient Instructions   If back  pain continues or worsens - I would recommend discussing plan with your neurosurgeon, or as we discussed I am happy to review what has been done previously, and make some other recommendations if needed. I am also happy to refer you to another specialist if you would like another opinion. Just let me know how I can help. Return to the clinic or go to the nearest emergency room if any of your symptoms worsen or new symptoms occur.  For diabetes, I would recommend minimum of every 6 months for hemoglobin A1c.  If that normalizes, or gets below prediabetes level, we can space out to possibly once per year. See other recommendations below.   I recommend pneumonia vaccine - let me know when we can order that for you.   You may not need to be on metoprolol. Start losartan for your blood pressure. Keep a record of your blood pressures outside of the office and over 130/80, let me know and we can restart metoprolol.    Your heart rate was elevated in the office. I will check your blood work, but would like to recheck that level in the next few weeks. Please return in the next few weeks to review your lab work and recheck fast heart rate. If you feel heart palpitations, chest pain, lightheadedness or dizziness, would recommend being seen here or other medical provider sooner.  Call and schedule appointment with rheumatologist. I refilled probenecid for now.   Please schedule ophthalmologist visit: [facility]: [facility]: [phone]     Diabetes Mellitus and Standards of Medical Care Managing diabetes (diabetes mellitus) can be complicated. Your diabetes treatment may be managed by a team of health care providers, including:  A diet and nutrition  specialist (registered dietitian).  A nurse.  A certified diabetes educator (CDE).  A diabetes specialist (endocrinologist).  An eye doctor.  A primary care provider.  A dentist.  Your health care providers follow a schedule  in order to help you get the best quality of care. The following schedule is a general guideline for your diabetes management plan. Your health care providers may also give you more specific instructions. HbA1c ( hemoglobin A1c) test This test provides information about blood sugar (glucose) control over the previous 2-3 months. It is used to check whether your diabetes management plan needs to be adjusted.  If you are meeting your treatment goals, this test is done at least 2 times a year.  If you are not meeting treatment goals or if your treatment goals have changed, this test is done 4 times a year.  Blood pressure test  This test is done at every routine medical visit. For most people, the goal is less than 130/80. Ask your health care provider what your goal blood pressure should be. Dental and eye exams  Visit your dentist two times a year.  If you have type 1 diabetes, get an eye exam 3-5 years after you are diagnosed, and then once a year after your first exam. ? If you were diagnosed with type 1 diabetes as a child, get an eye exam when you are age 72 or older and have had diabetes for 3-5 years. After the first exam, you should get an eye exam once a year.  If you have type 2 diabetes, have an eye exam as soon as you are diagnosed, and then once a year after your first exam. Foot care exam  Visual foot exams are done at every routine medical visit. The exams check for cuts, bruises, redness, blisters, sores, or other problems with the feet.  A complete foot exam is done by your health care provider once a year. This exam includes an inspection of the structure and skin of your feet, and a check of the pulses and sensation in your feet. ? Type 1 diabetes: Get your first exam 3-5 years after diagnosis. ? Type 2 diabetes: Get your first exam as soon as you are diagnosed.  Check your feet every day for cuts, bruises, redness, blisters, or sores. If you have any of these or other  problems that are not healing, contact your health care provider. Kidney function test ( urine microalbumin)  This test is done once a year. ? Type 1 diabetes: Get your first test 5 years after diagnosis. ? Type 2 diabetes: Get your first test as soon as you are diagnosed.  If you have chronic kidney disease (CKD), get a serum creatinine and estimated glomerular filtration rate (eGFR) test once a year. Lipid profile (cholesterol, HDL, LDL, triglycerides)  This test should be done when you are diagnosed with diabetes, and every 5 years after the first test. If you are on medicines to lower your cholesterol, you may need to get this test done every year. ? The goal for LDL is less than 100 mg/dL (5.5 mmol/L). If you are at high risk, the goal is less than 70 mg/dL (3.9 mmol/L). ? The goal for HDL is 40 mg/dL (2.2 mmol/L) for men and 50 mg/dL(2.8 mmol/L) for women. An HDL cholesterol of 60 mg/dL (3.3 mmol/L) or higher gives some protection against heart disease. ? The goal for triglycerides is less than 150 mg/dL (8.3 mmol/L). Immunizations  The yearly flu (influenza)  vaccine is recommended for everyone 6 months or older who has diabetes.  The pneumonia (pneumococcal) vaccine is recommended for everyone 2 years or older who has diabetes. If you are 69 or older, you may get the pneumonia vaccine as a series of two separate shots.  The hepatitis B vaccine is recommended for adults shortly after they have been diagnosed with diabetes.  The Tdap (tetanus, diphtheria, and pertussis) vaccine should be given: ? According to normal childhood vaccination schedules, for children. ? Every 10 years, for adults who have diabetes.  The shingles vaccine is recommended for people who have had chicken pox and are 50 years or older. Mental and emotional health  Screening for symptoms of eating disorders, anxiety, and depression is recommended at the time of diagnosis and afterward as needed. If your  screening shows that you have symptoms (you have a positive screening result), you may need further evaluation and be referred to a mental health care provider. Diabetes self-management education  Education about how to manage your diabetes is recommended at diagnosis and ongoing as needed. Treatment plan  Your treatment plan will be reviewed at every medical visit. Summary  Managing diabetes (diabetes mellitus) can be complicated. Your diabetes treatment may be managed by a team of health care providers.  Your health care providers follow a schedule in order to help you get the best quality of care.  Standards of care including having regular physical exams, blood tests, blood pressure monitoring, immunizations, screening tests, and education about how to manage your diabetes.  Your health care providers may also give you more specific instructions based on your individual health. This information is not intended to replace advice given to you by your health care provider. Make sure you discuss any questions you have with your health care provider. Document Released: 06/06/2009 Document Revised: 05/07/2016 Document Reviewed: 05/07/2016 Elsevier Interactive Patient Education  2018 [employer].   Sinus Tachycardia Sinus tachycardia is a kind of fast heartbeat. In sinus tachycardia, the heart beats more than 100 times a minute. Sinus tachycardia starts in a part of the heart called the sinus node. Sinus tachycardia may be harmless, or it may be a sign of a serious condition. What are the causes? This condition may be caused by:  Exercise or exertion.  A fever.  Pain.  Loss of body fluids (dehydration).  Severe bleeding (hemorrhage).  Anxiety and stress.  Certain substances, including: ? Alcohol. ? Caffeine. ? Tobacco and nicotine products. ? Diet pills. ? Illegal drugs.  Medical conditions including: ? Heart disease. ? An infection. ? An overactive thyroid  (hyperthyroidism). ? A lack of red blood cells (anemia).  What are the signs or symptoms? Symptoms of this condition include:  A feeling that the heart is beating quickly (palpitations).  Suddenly noticing your heartbeat (cardiac awareness).  Dizziness.  Tiredness (fatigue).  Shortness of breath.  Chest pain.  Nausea.  Fainting.  How is this diagnosed? This condition is diagnosed with:  A physical exam.  Other tests, such as: ? Blood tests. ? An electrocardiogram (ECG). This test measures the electrical activity of the heart. ? Holter monitoring. For this test, you wear a device that records your heartbeat for one or more days.  You may be referred to a heart specialist (cardiologist). How is this treated? Treatment for this condition depends on the cause or underlying condition. Treatment may involve:  Treating the underlying condition.  Taking new medicines or changing your current medicines as told by your  health care provider.  Making changes to your diet or lifestyle.  Practicing relaxation methods.  Follow these instructions at home: Lifestyle  Do not use any products that contain nicotine or tobacco, such as cigarettes and e-cigarettes. If you need help quitting, ask your health care provider.  Learn relaxation methods, like deep breathing, to help you when you get stressed or anxious.  Do not use illegal drugs, such as cocaine.  Do not abuse alcohol. Limit alcohol intake to no more than 1 drink a day for non-pregnant women and 2 drinks a day for men. One drink equals 12 oz of beer, 5 oz of wine, or 1 oz of hard liquor.  Find time to rest and relax often. This reduces stress.  Avoid: ? Caffeine. ? Stimulants such as over-the-counter diet pills or pills that help you to stay awake. ? Situations that cause anxiety or stress. General instructions  Drink enough fluids to keep your urine clear or pale yellow.  Take over-the-counter and prescription  medicines only as told by your health care provider.  Keep all follow-up visits as told by your health care provider. This is important. Contact a health care provider if:  You have a fever.  You have vomiting or diarrhea that keeps happening (is persistent). Get help right away if:  You have pain in your chest, upper arms, jaw, or neck.  You become weak or dizzy.  You feel faint.  You have palpitations that do not go away. This information is not intended to replace advice given to you by your health care provider. Make sure you discuss any questions you have with your health care provider. Document Released: 09/16/2004 Document Revised: 03/06/2016 Document Reviewed: 02/21/2015 Elsevier Interactive Patient Education  2018 [employer].    IF you received an x-ray today, you will receive an invoice from [facility]. Please contact [facility] at [phone] with questions or concerns regarding your invoice.   IF you received labwork today, you will receive an invoice from [place] [facility]. Please contact [facility] at [phone] with questions or concerns regarding your invoice.   Our billing staff will not be able to assist you with questions regarding bills from these companies.  You will be contacted with the lab results as soon as they are available. The fastest way to get your results is to activate your My Chart account. Instructions are located on the last page of this paperwork. If you have not heard from [place] regarding the results in 2 weeks, please contact this office.       I personally performed the services described in this documentation, which was scribed in my presence. The recorded information has been reviewed and considered for accuracy and completeness, addended by me as needed, and agree with information above.  Signed,   Merri Ray, MD [facility] [place].  02/13/17 10:10 PM

## 2017-02-11 NOTE — Patient Instructions (Addendum)
If back pain continues or worsens - I would recommend discussing plan with your neurosurgeon, or as we discussed I am happy to review what has been done previously, and make some other recommendations if needed. I am also happy to refer you to another specialist if you would like another opinion. Just let me know how I can help. Return to the clinic or go to the nearest emergency room if any of your symptoms worsen or new symptoms occur.  For diabetes, I would recommend minimum of every 6 months for hemoglobin A1c.  If that normalizes, or gets below prediabetes level, we can space out to possibly once per year. See other recommendations below.   I recommend pneumonia vaccine - let me know when we can order that for you.   You may not need to be on metoprolol. Start losartan for your blood pressure. Keep a record of your blood pressures outside of the office and over 130/80, let me know and we can restart metoprolol.    Your heart rate was elevated in the office. I will check your blood work, but would like to recheck that level in the next few weeks. Please return in the next few weeks to review your lab work and recheck fast heart rate. If you feel heart palpitations, chest pain, lightheadedness or dizziness, would recommend being seen here or other medical provider sooner.  Call and schedule appointment with rheumatologist. I refilled probenecid for now.   Please schedule ophthalmologist visit: [facility]: [facility]: [phone]     Diabetes Mellitus and Standards of Medical Care Managing diabetes (diabetes mellitus) can be complicated. Your diabetes treatment may be managed by a team of health care providers, including:  A diet and nutrition specialist (registered dietitian).  A nurse.  A certified diabetes educator (CDE).  A diabetes specialist (endocrinologist).  An eye doctor.  A primary care provider.  A dentist.  Your health care providers follow a  schedule in order to help you get the best quality of care. The following schedule is a general guideline for your diabetes management plan. Your health care providers may also give you more specific instructions. HbA1c ( hemoglobin A1c) test This test provides information about blood sugar (glucose) control over the previous 2-3 months. It is used to check whether your diabetes management plan needs to be adjusted.  If you are meeting your treatment goals, this test is done at least 2 times a year.  If you are not meeting treatment goals or if your treatment goals have changed, this test is done 4 times a year.  Blood pressure test  This test is done at every routine medical visit. For most people, the goal is less than 130/80. Ask your health care provider what your goal blood pressure should be. Dental and eye exams  Visit your dentist two times a year.  If you have type 1 diabetes, get an eye exam 3-5 years after you are diagnosed, and then once a year after your first exam. ? If you were diagnosed with type 1 diabetes as a child, get an eye exam when you are age 63 or older and have had diabetes for 3-5 years. After the first exam, you should get an eye exam once a year.  If you have type 2 diabetes, have an eye exam as soon as you are diagnosed, and then once a year after your first exam. Foot care exam  Visual foot exams are done at every  routine medical visit. The exams check for cuts, bruises, redness, blisters, sores, or other problems with the feet.  A complete foot exam is done by your health care provider once a year. This exam includes an inspection of the structure and skin of your feet, and a check of the pulses and sensation in your feet. ? Type 1 diabetes: Get your first exam 3-5 years after diagnosis. ? Type 2 diabetes: Get your first exam as soon as you are diagnosed.  Check your feet every day for cuts, bruises, redness, blisters, or sores. If you have any of these or  other problems that are not healing, contact your health care provider. Kidney function test ( urine microalbumin)  This test is done once a year. ? Type 1 diabetes: Get your first test 5 years after diagnosis. ? Type 2 diabetes: Get your first test as soon as you are diagnosed.  If you have chronic kidney disease (CKD), get a serum creatinine and estimated glomerular filtration rate (eGFR) test once a year. Lipid profile (cholesterol, HDL, LDL, triglycerides)  This test should be done when you are diagnosed with diabetes, and every 5 years after the first test. If you are on medicines to lower your cholesterol, you may need to get this test done every year. ? The goal for LDL is less than 100 mg/dL (5.5 mmol/L). If you are at high risk, the goal is less than 70 mg/dL (3.9 mmol/L). ? The goal for HDL is 40 mg/dL (2.2 mmol/L) for men and 50 mg/dL(2.8 mmol/L) for women. An HDL cholesterol of 60 mg/dL (3.3 mmol/L) or higher gives some protection against heart disease. ? The goal for triglycerides is less than 150 mg/dL (8.3 mmol/L). Immunizations  The yearly flu (influenza) vaccine is recommended for everyone 6 months or older who has diabetes.  The pneumonia (pneumococcal) vaccine is recommended for everyone 2 years or older who has diabetes. If you are 41 or older, you may get the pneumonia vaccine as a series of two separate shots.  The hepatitis B vaccine is recommended for adults shortly after they have been diagnosed with diabetes.  The Tdap (tetanus, diphtheria, and pertussis) vaccine should be given: ? According to normal childhood vaccination schedules, for children. ? Every 10 years, for adults who have diabetes.  The shingles vaccine is recommended for people who have had chicken pox and are 50 years or older. Mental and emotional health  Screening for symptoms of eating disorders, anxiety, and depression is recommended at the time of diagnosis and afterward as needed. If your  screening shows that you have symptoms (you have a positive screening result), you may need further evaluation and be referred to a mental health care provider. Diabetes self-management education  Education about how to manage your diabetes is recommended at diagnosis and ongoing as needed. Treatment plan  Your treatment plan will be reviewed at every medical visit. Summary  Managing diabetes (diabetes mellitus) can be complicated. Your diabetes treatment may be managed by a team of health care providers.  Your health care providers follow a schedule in order to help you get the best quality of care.  Standards of care including having regular physical exams, blood tests, blood pressure monitoring, immunizations, screening tests, and education about how to manage your diabetes.  Your health care providers may also give you more specific instructions based on your individual health. This information is not intended to replace advice given to you by your health care provider. Make sure  you discuss any questions you have with your health care provider. Document Released: 06/06/2009 Document Revised: 05/07/2016 Document Reviewed: 05/07/2016 Elsevier Interactive Patient Education  2018 [employer].   Sinus Tachycardia Sinus tachycardia is a kind of fast heartbeat. In sinus tachycardia, the heart beats more than 100 times a minute. Sinus tachycardia starts in a part of the heart called the sinus node. Sinus tachycardia may be harmless, or it may be a sign of a serious condition. What are the causes? This condition may be caused by:  Exercise or exertion.  A fever.  Pain.  Loss of body fluids (dehydration).  Severe bleeding (hemorrhage).  Anxiety and stress.  Certain substances, including: ? Alcohol. ? Caffeine. ? Tobacco and nicotine products. ? Diet pills. ? Illegal drugs.  Medical conditions including: ? Heart disease. ? An infection. ? An overactive thyroid  (hyperthyroidism). ? A lack of red blood cells (anemia).  What are the signs or symptoms? Symptoms of this condition include:  A feeling that the heart is beating quickly (palpitations).  Suddenly noticing your heartbeat (cardiac awareness).  Dizziness.  Tiredness (fatigue).  Shortness of breath.  Chest pain.  Nausea.  Fainting.  How is this diagnosed? This condition is diagnosed with:  A physical exam.  Other tests, such as: ? Blood tests. ? An electrocardiogram (ECG). This test measures the electrical activity of the heart. ? Holter monitoring. For this test, you wear a device that records your heartbeat for one or more days.  You may be referred to a heart specialist (cardiologist). How is this treated? Treatment for this condition depends on the cause or underlying condition. Treatment may involve:  Treating the underlying condition.  Taking new medicines or changing your current medicines as told by your health care provider.  Making changes to your diet or lifestyle.  Practicing relaxation methods.  Follow these instructions at home: Lifestyle  Do not use any products that contain nicotine or tobacco, such as cigarettes and e-cigarettes. If you need help quitting, ask your health care provider.  Learn relaxation methods, like deep breathing, to help you when you get stressed or anxious.  Do not use illegal drugs, such as cocaine.  Do not abuse alcohol. Limit alcohol intake to no more than 1 drink a day for non-pregnant women and 2 drinks a day for men. One drink equals 12 oz of beer, 5 oz of wine, or 1 oz of hard liquor.  Find time to rest and relax often. This reduces stress.  Avoid: ? Caffeine. ? Stimulants such as over-the-counter diet pills or pills that help you to stay awake. ? Situations that cause anxiety or stress. General instructions  Drink enough fluids to keep your urine clear or pale yellow.  Take over-the-counter and prescription  medicines only as told by your health care provider.  Keep all follow-up visits as told by your health care provider. This is important. Contact a health care provider if:  You have a fever.  You have vomiting or diarrhea that keeps happening (is persistent). Get help right away if:  You have pain in your chest, upper arms, jaw, or neck.  You become weak or dizzy.  You feel faint.  You have palpitations that do not go away. This information is not intended to replace advice given to you by your health care provider. Make sure you discuss any questions you have with your health care provider. Document Released: 09/16/2004 Document Revised: 03/06/2016 Document Reviewed: 02/21/2015 Elsevier Interactive Patient Education  2018  [employer].    IF you received an x-ray today, you will receive an invoice from [facility]. Please contact [facility] at [phone] with questions or concerns regarding your invoice.   IF you received labwork today, you will receive an invoice from [facility]. Please contact [facility] at [phone] with questions or concerns regarding your invoice.   Our billing staff will not be able to assist you with questions regarding bills from these companies.  You will be contacted with the lab results as soon as they are available. The fastest way to get your results is to activate your My Chart account. Instructions are located on the last page of this paperwork. If you have not heard from [place] regarding the results in 2 weeks, please contact this office.

## 2017-02-12 LAB — HEMOGLOBIN A1C
Est. average glucose Bld gHb Est-mCnc: 108 mg/dL
Hgb A1c MFr Bld: 5.4 % (ref 4.8–5.6)

## 2017-02-12 LAB — COMPREHENSIVE METABOLIC PANEL
A/G RATIO: 1.3 (ref 1.2–2.2)
ALT: 18 IU/L (ref 0–32)
AST: 21 IU/L (ref 0–40)
Albumin: 4.5 g/dL (ref 3.5–4.8)
Alkaline Phosphatase: 76 IU/L (ref 39–117)
BUN/Creatinine Ratio: 24 (ref 12–28)
BUN: 18 mg/dL (ref 8–27)
Bilirubin Total: 0.4 mg/dL (ref 0.0–1.2)
CALCIUM: 9.7 mg/dL (ref 8.7–10.3)
CHLORIDE: 98 mmol/L (ref 96–106)
CO2: 22 mmol/L (ref 20–29)
Creatinine, Ser: 0.74 mg/dL (ref 0.57–1.00)
GFR calc Af Amer: 95 mL/min/{1.73_m2} (ref >59)
GFR, EST NON AFRICAN AMERICAN: 82 mL/min/{1.73_m2} (ref >59)
Globulin, Total: 3.6 g/dL (ref 1.5–4.5)
Glucose: 106 mg/dL — ABNORMAL HIGH (ref 65–99)
POTASSIUM: 3.8 mmol/L (ref 3.5–5.2)
Sodium: 140 mmol/L (ref 134–144)
Total Protein: 8.1 g/dL (ref 6.0–8.5)

## 2017-02-12 LAB — LIPID PANEL
CHOL/HDL RATIO: 4.8 ratio — AB (ref 0.0–4.4)
Cholesterol, Total: 217 mg/dL — ABNORMAL HIGH (ref 100–199)
HDL: 45 mg/dL (ref >39)
LDL Calculated: 127 mg/dL — ABNORMAL HIGH (ref 0–99)
TRIGLYCERIDES: 224 mg/dL — AB (ref 0–149)
VLDL Cholesterol Cal: 45 mg/dL — ABNORMAL HIGH (ref 5–40)

## 2017-02-12 LAB — CBC
HEMOGLOBIN: 13.5 g/dL (ref 11.1–15.9)
Hematocrit: 40.3 % (ref 34.0–46.6)
MCH: 30.2 pg (ref 26.6–33.0)
MCHC: 33.5 g/dL (ref 31.5–35.7)
MCV: 90 fL (ref 79–97)
PLATELETS: 189 10*3/uL (ref 150–379)
RBC: 4.47 x10E6/uL (ref 3.77–5.28)
RDW: 15 % (ref 12.3–15.4)
WBC: 6 10*3/uL (ref 3.4–10.8)

## 2017-02-12 LAB — TSH: TSH: 1.15 u[IU]/mL (ref 0.450–4.500)

## 2017-02-15 ENCOUNTER — Other Ambulatory Visit: Payer: Self-pay | Admitting: Family Medicine

## 2017-02-19 LAB — B12 AND FOLATE PANEL
Folate: >20 ng/mL (ref >3.0)
VITAMIN B 12: 744 pg/mL (ref 232–1245)

## 2017-02-19 LAB — SPECIMEN STATUS REPORT

## 2017-03-02 LAB — HM DIABETES EYE EXAM

## 2017-03-03 ENCOUNTER — Ambulatory Visit: Payer: Medicare Other | Admitting: Emergency Medicine

## 2017-06-16 ENCOUNTER — Telehealth: Payer: Self-pay

## 2017-06-16 NOTE — Telephone Encounter (Signed)
LMVM stating that patient needs to be seen in office for CPE with Dr. Alvy BimlerSagardia.  Anyone may schedule this appointment.

## 2017-07-04 NOTE — Telephone Encounter (Signed)
done

## 2017-07-18 ENCOUNTER — Other Ambulatory Visit: Payer: Self-pay | Admitting: Family Medicine

## 2017-07-18 DIAGNOSIS — I10 Essential (primary) hypertension: Secondary | ICD-10-CM

## 2017-07-18 DIAGNOSIS — E785 Hyperlipidemia, unspecified: Secondary | ICD-10-CM

## 2017-08-03 ENCOUNTER — Ambulatory Visit: Payer: Medicare Other | Admitting: Emergency Medicine

## 2017-08-14 ENCOUNTER — Other Ambulatory Visit: Payer: Self-pay | Admitting: Family Medicine

## 2017-08-14 DIAGNOSIS — E119 Type 2 diabetes mellitus without complications: Secondary | ICD-10-CM

## 2017-08-15 NOTE — Telephone Encounter (Signed)
Attempted to contact pt regarding prescription refill request; last office visit 02/11/17; no upcoming appointments noted; left message on voicemail [phone]

## 2017-09-02 ENCOUNTER — Other Ambulatory Visit: Payer: Self-pay | Admitting: Family Medicine

## 2017-09-02 DIAGNOSIS — E038 Other specified hypothyroidism: Secondary | ICD-10-CM

## 2017-09-06 ENCOUNTER — Other Ambulatory Visit: Payer: Self-pay

## 2017-09-06 ENCOUNTER — Encounter: Payer: Self-pay | Admitting: Emergency Medicine

## 2017-09-06 ENCOUNTER — Ambulatory Visit (INDEPENDENT_AMBULATORY_CARE_PROVIDER_SITE_OTHER): Payer: Medicare Other | Admitting: Emergency Medicine

## 2017-09-06 VITALS — BP 138/86 | HR 101 | Temp 98.6°F | Resp 16 | Ht 62.5 in | Wt 182.6 lb

## 2017-09-06 DIAGNOSIS — E785 Hyperlipidemia, unspecified: Secondary | ICD-10-CM

## 2017-09-06 DIAGNOSIS — I152 Hypertension secondary to endocrine disorders: Secondary | ICD-10-CM | POA: Insufficient documentation

## 2017-09-06 DIAGNOSIS — Z8669 Personal history of other diseases of the nervous system and sense organs: Secondary | ICD-10-CM

## 2017-09-06 DIAGNOSIS — M1A9XX0 Chronic gout, unspecified, without tophus (tophi): Secondary | ICD-10-CM | POA: Diagnosis not present

## 2017-09-06 DIAGNOSIS — E669 Obesity, unspecified: Secondary | ICD-10-CM | POA: Insufficient documentation

## 2017-09-06 DIAGNOSIS — Z23 Encounter for immunization: Secondary | ICD-10-CM | POA: Diagnosis not present

## 2017-09-06 DIAGNOSIS — Z8739 Personal history of other diseases of the musculoskeletal system and connective tissue: Secondary | ICD-10-CM

## 2017-09-06 DIAGNOSIS — E119 Type 2 diabetes mellitus without complications: Secondary | ICD-10-CM | POA: Diagnosis not present

## 2017-09-06 DIAGNOSIS — Z7689 Persons encountering health services in other specified circumstances: Secondary | ICD-10-CM

## 2017-09-06 DIAGNOSIS — E038 Other specified hypothyroidism: Secondary | ICD-10-CM

## 2017-09-06 DIAGNOSIS — I10 Essential (primary) hypertension: Secondary | ICD-10-CM

## 2017-09-06 MED ORDER — LOSARTAN POTASSIUM 100 MG PO TABS: 100.0000 mg | ORAL_TABLET | Freq: Every day | ORAL | 1 refills | Status: DC

## 2017-09-06 MED ORDER — GABAPENTIN 100 MG PO CAPS: ORAL_CAPSULE | ORAL | 3 refills | Status: DC

## 2017-09-06 MED ORDER — METFORMIN HCL 500 MG PO TABS: 500.0000 mg | ORAL_TABLET | Freq: Two times a day (BID) | ORAL | 1 refills | Status: DC

## 2017-09-06 MED ORDER — LEVOTHYROXINE SODIUM 75 MCG PO TABS: 75.0000 ug | ORAL_TABLET | Freq: Every day | ORAL | 1 refills | Status: DC

## 2017-09-06 MED ORDER — ROSUVASTATIN CALCIUM 10 MG PO TABS: 10.0000 mg | ORAL_TABLET | Freq: Every day | ORAL | 1 refills | Status: DC

## 2017-09-06 MED ORDER — METOPROLOL TARTRATE 25 MG PO TABS: 25.0000 mg | ORAL_TABLET | Freq: Two times a day (BID) | ORAL | 1 refills | Status: DC

## 2017-09-06 MED ORDER — PROBENECID 500 MG PO TABS: 500.0000 mg | ORAL_TABLET | Freq: Two times a day (BID) | ORAL | 2 refills | Status: DC

## 2017-09-06 NOTE — Patient Instructions (Addendum)
Health Maintenance, Female Adopting a healthy lifestyle and getting preventive care can go a long way to promote health and wellness. Talk with your health care provider about what schedule of regular examinations is right for you. This is a good chance for you to check in with your provider about disease prevention and staying healthy. In between checkups, there are plenty of things you can do on your own. Experts have done a lot of research about which lifestyle changes and preventive measures are most likely to keep you healthy. Ask your health care provider for more information. Weight and diet Eat a healthy diet  Be sure to include plenty of vegetables, fruits, low-fat dairy products, and lean protein.  Do not eat a lot of foods high in solid fats, added sugars, or salt.  Get regular exercise. This is one of the most important things you can do for your health. ? Most adults should exercise for at least 150 minutes each week. The exercise should increase your heart rate and make you sweat (moderate-intensity exercise). ? Most adults should also do strengthening exercises at least twice a week. This is in addition to the moderate-intensity exercise.  Maintain a healthy weight  Body mass index (BMI) is a measurement that can be used to identify possible weight problems. It estimates body fat based on height and weight. Your health care provider can help determine your BMI and help you achieve or maintain a healthy weight.  For females 46 years of age and older: ? A BMI below 18.5 is considered underweight. ? A BMI of 18.5 to 24.9 is normal. ? A BMI of 25 to 29.9 is considered overweight. ? A BMI of 30 and above is considered obese.  Watch levels of cholesterol and blood lipids  You should start having your blood tested for lipids and cholesterol at 71 years of age, then have this test every 5 years.  You may need to have your cholesterol levels checked more often if: ? Your lipid  or cholesterol levels are high. ? You are older than 71 years of age. ? You are at high risk for heart disease.  Cancer screening Lung Cancer  Lung cancer screening is recommended for adults 79-62 years old who are at high risk for lung cancer because of a history of smoking.  A yearly low-dose CT scan of the lungs is recommended for people who: ? Currently smoke. ? Have quit within the past 15 years. ? Have at least a 30-pack-year history of smoking. A pack year is smoking an average of one pack of cigarettes a day for 1 year.  Yearly screening should continue until it has been 15 years since you quit.  Yearly screening should stop if you develop a health problem that would prevent you from having lung cancer treatment.  Breast Cancer  Practice breast self-awareness. This means understanding how your breasts normally appear and feel.  It also means doing regular breast self-exams. Let your health care provider know about any changes, no matter how small.  If you are in your 20s or 30s, you should have a clinical breast exam (CBE) by a health care provider every 1-3 years as part of a regular health exam.  If you are 59 or older, have a CBE every year. Also consider having a breast X-ray (mammogram) every year.  If you have a family history of breast cancer, talk to your health care provider about genetic screening.  If you are at high  risk for breast cancer, talk to your health care provider about having an MRI and a mammogram every year.  Breast cancer gene (BRCA) assessment is recommended for women who have family members with BRCA-related cancers. BRCA-related cancers include: ? Breast. ? Ovarian. ? Tubal. ? Peritoneal cancers.  Results of the assessment will determine the need for genetic counseling and BRCA1 and BRCA2 testing.  Cervical Cancer Your health care provider may recommend that you be screened regularly for cancer of the pelvic organs (ovaries, uterus, and  vagina). This screening involves a pelvic examination, including checking for microscopic changes to the surface of your cervix (Pap test). You may be encouraged to have this screening done every 3 years, beginning at age 21.  For women ages 30-65, health care providers may recommend pelvic exams and Pap testing every 3 years, or they may recommend the Pap and pelvic exam, combined with testing for human papilloma virus (HPV), every 5 years. Some types of HPV increase your risk of cervical cancer. Testing for HPV may also be done on women of any age with unclear Pap test results.  Other health care providers may not recommend any screening for nonpregnant women who are considered low risk for pelvic cancer and who do not have symptoms. Ask your health care provider if a screening pelvic exam is right for you.  If you have had past treatment for cervical cancer or a condition that could lead to cancer, you need Pap tests and screening for cancer for at least 20 years after your treatment. If Pap tests have been discontinued, your risk factors (such as having a new sexual partner) need to be reassessed to determine if screening should resume. Some women have medical problems that increase the chance of getting cervical cancer. In these cases, your health care provider may recommend more frequent screening and Pap tests.  Colorectal Cancer  This type of cancer can be detected and often prevented.  Routine colorectal cancer screening usually begins at 71 years of age and continues through 71 years of age.  Your health care provider may recommend screening at an earlier age if you have risk factors for colon cancer.  Your health care provider may also recommend using home test kits to check for hidden blood in the stool.  A small camera at the end of a tube can be used to examine your colon directly (sigmoidoscopy or colonoscopy). This is done to check for the earliest forms of colorectal  cancer.  Routine screening usually begins at age 50.  Direct examination of the colon should be repeated every 5-10 years through 71 years of age. However, you may need to be screened more often if early forms of precancerous polyps or small growths are found.  Skin Cancer  Check your skin from head to toe regularly.  Tell your health care provider about any new moles or changes in moles, especially if there is a change in a mole's shape or color.  Also tell your health care provider if you have a mole that is larger than the size of a pencil eraser.  Always use sunscreen. Apply sunscreen liberally and repeatedly throughout the day.  Protect yourself by wearing long sleeves, pants, a wide-brimmed hat, and sunglasses whenever you are outside.  Heart disease, diabetes, and high blood pressure  High blood pressure causes heart disease and increases the risk of stroke. High blood pressure is more likely to develop in: ? People who have blood pressure in the high end   of the normal range (130-139/85-89 mm Hg). ? People who are overweight or obese. ? People who are African American.  If you are 18-39 years of age, have your blood pressure checked every 3-5 years. If you are 40 years of age or older, have your blood pressure checked every year. You should have your blood pressure measured twice-once when you are at a hospital or clinic, and once when you are not at a hospital or clinic. Record the average of the two measurements. To check your blood pressure when you are not at a hospital or clinic, you can use: ? An automated blood pressure machine at a pharmacy. ? A home blood pressure monitor.  If you are between 55 years and 79 years old, ask your health care provider if you should take aspirin to prevent strokes.  Have regular diabetes screenings. This involves taking a blood sample to check your fasting blood sugar level. ? If you are at a normal weight and have a low risk for diabetes,  have this test once every three years after 71 years of age. ? If you are overweight and have a high risk for diabetes, consider being tested at a younger age or more often. Preventing infection Hepatitis B  If you have a higher risk for hepatitis B, you should be screened for this virus. You are considered at high risk for hepatitis B if: ? You were born in a country where hepatitis B is common. Ask your health care provider which countries are considered high risk. ? Your parents were born in a high-risk country, and you have not been immunized against hepatitis B (hepatitis B vaccine). ? You have HIV or AIDS. ? You use needles to inject street drugs. ? You live with someone who has hepatitis B. ? You have had sex with someone who has hepatitis B. ? You get hemodialysis treatment. ? You take certain medicines for conditions, including cancer, organ transplantation, and autoimmune conditions.  Hepatitis C  Blood testing is recommended for: ? Everyone born from 1945 through 1965. ? Anyone with known risk factors for hepatitis C.  Sexually transmitted infections (STIs)  You should be screened for sexually transmitted infections (STIs) including gonorrhea and chlamydia if: ? You are sexually active and are younger than 71 years of age. ? You are older than 71 years of age and your health care provider tells you that you are at risk for this type of infection. ? Your sexual activity has changed since you were last screened and you are at an increased risk for chlamydia or gonorrhea. Ask your health care provider if you are at risk.  If you do not have HIV, but are at risk, it may be recommended that you take a prescription medicine daily to prevent HIV infection. This is called pre-exposure prophylaxis (PrEP). You are considered at risk if: ? You are sexually active and do not regularly use condoms or know the HIV status of your partner(s). ? You take drugs by injection. ? You are  sexually active with a partner who has HIV.  Talk with your health care provider about whether you are at high risk of being infected with HIV. If you choose to begin PrEP, you should first be tested for HIV. You should then be tested every 3 months for as long as you are taking PrEP. Pregnancy  If you are premenopausal and you may become pregnant, ask your health care provider about preconception counseling.  If you may   become pregnant, take 400 to 800 micrograms (mcg) of folic acid every day.  If you want to prevent pregnancy, talk to your health care provider about birth control (contraception). Osteoporosis and menopause  Osteoporosis is a disease in which the bones lose minerals and strength with aging. This can result in serious bone fractures. Your risk for osteoporosis can be identified using a bone density scan.  If you are 43 years of age or older, or if you are at risk for osteoporosis and fractures, ask your health care provider if you should be screened.  Ask your health care provider whether you should take a calcium or vitamin D supplement to lower your risk for osteoporosis.  Menopause may have certain physical symptoms and risks.  Hormone replacement therapy may reduce some of these symptoms and risks. Talk to your health care provider about whether hormone replacement therapy is right for you. Follow these instructions at home:  Schedule regular health, dental, and eye exams.  Stay current with your immunizations.  Do not use any tobacco products including cigarettes, chewing tobacco, or electronic cigarettes.  If you are pregnant, do not drink alcohol.  If you are breastfeeding, limit how much and how often you drink alcohol.  Limit alcohol intake to no more than 1 drink per day for nonpregnant women. One drink equals 12 ounces of beer, 5 ounces of wine, or 1 ounces of hard liquor.  Do not use street drugs.  Do not share needles.  Ask your health care  provider for help if you need support or information about quitting drugs.  Tell your health care provider if you often feel depressed.  Tell your health care provider if you have ever been abused or do not feel safe at home. This information is not intended to replace advice given to you by your health care provider. Make sure you discuss any questions you have with your health care provider. Document Released: 02/22/2011 Document Revised: 01/15/2016 Document Reviewed: 05/13/2015 Elsevier Interactive Patient Education  2018 [employer].   IF you received an x-ray today, you will receive an invoice from [facility]. Please contact [facility] at [phone] with questions or concerns regarding your invoice.   IF you received labwork today, you will receive an invoice from [facility]. Please contact [facility] at [phone] with questions or concerns regarding your invoice.   Our billing staff will not be able to assist you with questions regarding bills from these companies.  You will be contacted with the lab results as soon as they are available. The fastest way to get your results is to activate your My Chart account. Instructions are located on the last page of this paperwork. If you have not heard from [place] regarding the results in 2 weeks, please contact this office.

## 2017-09-06 NOTE — Assessment & Plan Note (Signed)
Will check A1C; continue Metformin.

## 2017-09-06 NOTE — Assessment & Plan Note (Signed)
Chronic pain; will refer to Ortho and re-start Gabapentin at nighttime.

## 2017-09-06 NOTE — Assessment & Plan Note (Signed)
Last lipid profile high; will recheck today; continue Crestor.

## 2017-09-06 NOTE — Assessment & Plan Note (Signed)
Clinically stable; last TSH normal 6 months ago. Continue Synthroid.

## 2017-09-06 NOTE — Progress Notes (Signed)
Isabella Dixon 72 y.o.   Chief Complaint  Patient presents with  . Medication Refill    synthroid,metformin,cozaar,benemid and crestor  . Immunizations    flu vaccine    HISTORY OF PRESENT ILLNESS: This is a 72 y.o. female here to establish care; 1st time visit with me; has h/o DM, HTN, Dyslipidemia, thyroid disease, gout, sciatica. Has no acute complaints; needs medication refills; wants to go back on Gabapentin which was helping sciatica symptoms; has seen Neurologist before; had MRI lumbar spine done 2 years ago and it showed bulging discs. No Ortho yet.  HPI   Prior to Admission medications   Medication Sig Start Date End Date Taking? Authorizing Provider  levothyroxine (SYNTHROID, LEVOTHROID) 75 MCG tablet TAKE 1 TABLET DAILY 09/02/17  Yes Kaemon Barnett, Ines Bloomer, MD  losartan (COZAAR) 100 MG tablet Take 1 tablet (100 mg total) by mouth daily. Office visit needed for 90 day 07/18/17  Yes Wendie Agreste, MD  metFORMIN (GLUCOPHAGE) 500 MG tablet TAKE 1 TABLET TWICE A DAY WITH MEALS 08/15/17  Yes Wendie Agreste, MD  Multiple Vitamin (MULTIVITAMIN) tablet Take 1 tablet by mouth daily.   Yes [provider]  Naproxen Sodium (ALEVE PO) Take by mouth.   Yes [provider]  OVER THE COUNTER MEDICATION OTC Vit D 1400 mg daily   Yes [provider]  probenecid (BENEMID) 500 MG tablet Take 1 tablet (500 mg total) by mouth 2 (two) times daily. 2 tablets qam, then 1-2 tabs Qpm 02/11/17  Yes Wendie Agreste, MD  rosuvastatin (CRESTOR) 10 MG tablet Take 1 tablet (10 mg total) by mouth daily. Office visit needed for 90 day 07/18/17  Yes Wendie Agreste, MD  aspirin EC 81 MG tablet Take 81 mg by mouth daily.    [provider]  Choline Fenofibrate (FENOFIBRIC ACID) 135 MG CPDR Take 135 mg by mouth daily. Patient not taking: Reported on 09/06/2017 11/21/15   Darlyne Russian, MD  gabapentin (NEURONTIN) 100 MG capsule Take 2-3 capsules 3 times a  day. Patient not taking: Reported on 09/06/2017 12/11/15   Darlyne Russian, MD  metoprolol tartrate (LOPRESSOR) 25 MG tablet TAKE 1 TABLET TWICE A DAY Patient not taking: Reported on 09/06/2017 01/05/16   Darlyne Russian, MD    No Known Allergies  Patient Active Problem List   Diagnosis Date Noted  . Diabetes mellitus type 2 in obese ([place]) 05/13/2015  . Hypothyroid 07/25/2012  . Gout 05/02/2012  . Osteoarthritis 05/02/2012  . Hypertension 05/02/2012  . Hyperlipidemia 05/02/2012  . Abnormal pap 05/02/2012  . Hyperglycemia 05/02/2012    Past Medical History:  Diagnosis Date  . Anemia   . Arthritis   . Diabetes mellitus without complication ([place])   . Gout   . High cholesterol   . Hypertension   . Osteoporosis     Past Surgical History:  Procedure Laterality Date  . ABDOMINAL HYSTERECTOMY    . TUBAL LIGATION      Social History   Socioeconomic History  . Marital status: Married    Spouse name: Not on file  . Number of children: Not on file  . Years of education: Not on file  . Highest education level: Not on file  Social Needs  . Financial resource strain: Not on file  . Food insecurity - worry: Not on file  . Food insecurity - inability: Not on file  . Transportation needs - medical: Not on file  . Transportation needs -  non-medical: Not on file  Occupational History  . Not on file  Tobacco Use  . Smoking status: Never Smoker  . Smokeless tobacco: Never Used  Substance and Sexual Activity  . Alcohol use: No  . Drug use: No  . Sexual activity: Yes  Other Topics Concern  . Not on file  Social History Narrative  . Not on file    Family History  Problem Relation Age of Onset  . Heart disease Mother   . Stroke Mother   . Hypertension Mother   . Stroke Father   . Cancer Sister   . Cancer Son   . Stroke Paternal Grandmother   . Cancer Paternal Grandmother   . Stroke Maternal Grandmother   . Cancer Paternal Grandfather      Review of Systems   Constitutional: Negative.  Negative for chills, fever and weight loss.  HENT: Negative.  Negative for congestion, hearing loss, nosebleeds, sinus pain and sore throat.   Eyes: Negative.  Negative for blurred vision and double vision.  Respiratory: Negative.  Negative for cough and shortness of breath.   Cardiovascular: Negative.  Negative for chest pain, palpitations, orthopnea, claudication and leg swelling.  Gastrointestinal: Negative.  Negative for abdominal pain, blood in stool, diarrhea, melena, nausea and vomiting.  Genitourinary: Negative.  Negative for dysuria, flank pain and hematuria.  Musculoskeletal: Positive for back pain. Negative for joint pain, myalgias and neck pain.  Skin: Negative.   Neurological: Negative.  Negative for dizziness, sensory change, speech change, focal weakness and headaches.  Endo/Heme/Allergies: Negative.   All other systems reviewed and are negative.   Vitals:   09/06/17 0807  BP: (!) 162/98  Pulse: (!) 101  Resp: 16  Temp: 98.6 F (37 C)  SpO2: 98%    Physical Exam  Constitutional: She is oriented to person, place, and time. She appears well-developed and well-nourished.  HENT:  Head: Normocephalic and atraumatic.  Right Ear: External ear normal.  Left Ear: External ear normal.  Nose: Nose normal.  Mouth/Throat: Oropharynx is clear and moist.  Eyes: Conjunctivae and EOM are normal. Pupils are equal, round, and reactive to light.  Neck: Normal range of motion. Neck supple. No JVD present. No thyromegaly present.  Cardiovascular: Normal rate, regular rhythm, normal heart sounds and intact distal pulses.  Pulmonary/Chest: Effort normal and breath sounds normal.  Abdominal: Soft. Bowel sounds are normal. She exhibits no distension. There is no tenderness.  Musculoskeletal: Normal range of motion. She exhibits no edema or tenderness.  Lymphadenopathy:    She has no cervical adenopathy.  Neurological: She is alert and oriented to person,  place, and time. No sensory deficit. She exhibits normal muscle tone. Coordination normal.  Skin: Skin is warm and dry. Capillary refill takes less than 2 seconds. No rash noted.  Psychiatric: She has a normal mood and affect.  Vitals reviewed.  Hypertension Initially high and repest 138/86; was taken off Lopressor but feels she needs to go back on it; took it for years; has slight tachycardia; will re-start 25 mg bid. Continue Cozaar.  Diabetes mellitus type 2 in obese Will check A1C; continue Metformin.  Hyperlipidemia Last lipid profile high; will recheck today; continue Crestor.  History of sciatica Chronic pain; will refer to Ortho and re-start Gabapentin at nighttime.  Gout Stable and under control; will refill Probenecid.  Hypothyroid Clinically stable; last TSH normal 6 months ago. Continue Synthroid.    ASSESSMENT & PLAN:  Cyncere was seen today for medication refill and immunizations.  Diagnoses and all orders for this visit:  Essential hypertension -     Basic metabolic panel -     metoprolol tartrate (LOPRESSOR) 25 MG tablet; Take 1 tablet (25 mg total) by mouth 2 (two) times daily. -     losartan (COZAAR) 100 MG tablet; Take 1 tablet (100 mg total) by mouth daily. Office visit needed for 90 day  Need for prophylactic vaccination and inoculation against influenza -     Flu Vaccine QUAD 36+ mos IM  Hyperlipidemia, unspecified hyperlipidemia type -     Lipid panel -     rosuvastatin (CRESTOR) 10 MG tablet; Take 1 tablet (10 mg total) by mouth daily. Office visit needed for 90 day  Type 2 diabetes mellitus without complication, without long-term current use of insulin (HCC) -     Basic metabolic panel -     Hemoglobin A1c -     metFORMIN (GLUCOPHAGE) 500 MG tablet; Take 1 tablet (500 mg total) by mouth 2 (two) times daily with a meal.  History of gout -     probenecid (BENEMID) 500 MG tablet; Take 1 tablet (500 mg total) by mouth 2 (two) times daily. 2  tablets qam, then 1-2 tabs Qpm  History of sciatica -     Ambulatory referral to Orthopedic Surgery -     gabapentin (NEURONTIN) 100 MG capsule; Take 2 capsules at bedtime.  Chronic gout without tophus, unspecified cause, unspecified site  Other specified hypothyroidism -     levothyroxine (SYNTHROID, LEVOTHROID) 75 MCG tablet; Take 1 tablet (75 mcg total) by mouth daily.  Encounter to establish care   Patient Instructions     Health Maintenance, Female Adopting a healthy lifestyle and getting preventive care can go a long way to promote health and wellness. Talk with your health care provider about what schedule of regular examinations is right for you. This is a good chance for you to check in with your provider about disease prevention and staying healthy. In between checkups, there are plenty of things you can do on your own. Experts have done a lot of research about which lifestyle changes and preventive measures are most likely to keep you healthy. Ask your health care provider for more information. Weight and diet Eat a healthy diet  Be sure to include plenty of vegetables, fruits, low-fat dairy products, and lean protein.  Do not eat a lot of foods high in solid fats, added sugars, or salt.  Get regular exercise. This is one of the most important things you can do for your health. ? Most adults should exercise for at least 150 minutes each week. The exercise should increase your heart rate and make you sweat (moderate-intensity exercise). ? Most adults should also do strengthening exercises at least twice a week. This is in addition to the moderate-intensity exercise.  Maintain a healthy weight  Body mass index (BMI) is a measurement that can be used to identify possible weight problems. It estimates body fat based on height and weight. Your health care provider can help determine your BMI and help you achieve or maintain a healthy weight.  For females 52 years of age and  older: ? A BMI below 18.5 is considered underweight. ? A BMI of 18.5 to 24.9 is normal. ? A BMI of 25 to 29.9 is considered overweight. ? A BMI of 30 and above is considered obese.  Watch levels of cholesterol and blood lipids  You should start having your blood tested for  lipids and cholesterol at 71 years of age, then have this test every 5 years.  You may need to have your cholesterol levels checked more often if: ? Your lipid or cholesterol levels are high. ? You are older than 71 years of age. ? You are at high risk for heart disease.  Cancer screening Lung Cancer  Lung cancer screening is recommended for adults 20-55 years old who are at high risk for lung cancer because of a history of smoking.  A yearly low-dose CT scan of the lungs is recommended for people who: ? Currently smoke. ? Have quit within the past 15 years. ? Have at least a 30-pack-year history of smoking. A pack year is smoking an average of one pack of cigarettes a day for 1 year.  Yearly screening should continue until it has been 15 years since you quit.  Yearly screening should stop if you develop a health problem that would prevent you from having lung cancer treatment.  Breast Cancer  Practice breast self-awareness. This means understanding how your breasts normally appear and feel.  It also means doing regular breast self-exams. Let your health care provider know about any changes, no matter how small.  If you are in your 20s or 30s, you should have a clinical breast exam (CBE) by a health care provider every 1-3 years as part of a regular health exam.  If you are 68 or older, have a CBE every year. Also consider having a breast X-ray (mammogram) every year.  If you have a family history of breast cancer, talk to your health care provider about genetic screening.  If you are at high risk for breast cancer, talk to your health care provider about having an MRI and a mammogram every year.  Breast  cancer gene (BRCA) assessment is recommended for women who have family members with BRCA-related cancers. BRCA-related cancers include: ? Breast. ? Ovarian. ? Tubal. ? Peritoneal cancers.  Results of the assessment will determine the need for genetic counseling and BRCA1 and BRCA2 testing.  Cervical Cancer Your health care provider may recommend that you be screened regularly for cancer of the pelvic organs (ovaries, uterus, and vagina). This screening involves a pelvic examination, including checking for microscopic changes to the surface of your cervix (Pap test). You may be encouraged to have this screening done every 3 years, beginning at age 16.  For women ages 68-65, health care providers may recommend pelvic exams and Pap testing every 3 years, or they may recommend the Pap and pelvic exam, combined with testing for human papilloma virus (HPV), every 5 years. Some types of HPV increase your risk of cervical cancer. Testing for HPV may also be done on women of any age with unclear Pap test results.  Other health care providers may not recommend any screening for nonpregnant women who are considered low risk for pelvic cancer and who do not have symptoms. Ask your health care provider if a screening pelvic exam is right for you.  If you have had past treatment for cervical cancer or a condition that could lead to cancer, you need Pap tests and screening for cancer for at least 20 years after your treatment. If Pap tests have been discontinued, your risk factors (such as having a new sexual partner) need to be reassessed to determine if screening should resume. Some women have medical problems that increase the chance of getting cervical cancer. In these cases, your health care provider may recommend more frequent  screening and Pap tests.  Colorectal Cancer  This type of cancer can be detected and often prevented.  Routine colorectal cancer screening usually begins at 71 years of age and  continues through 71 years of age.  Your health care provider may recommend screening at an earlier age if you have risk factors for colon cancer.  Your health care provider may also recommend using home test kits to check for hidden blood in the stool.  A small camera at the end of a tube can be used to examine your colon directly (sigmoidoscopy or colonoscopy). This is done to check for the earliest forms of colorectal cancer.  Routine screening usually begins at age 20.  Direct examination of the colon should be repeated every 5-10 years through 71 years of age. However, you may need to be screened more often if early forms of precancerous polyps or small growths are found.  Skin Cancer  Check your skin from head to toe regularly.  Tell your health care provider about any new moles or changes in moles, especially if there is a change in a mole's shape or color.  Also tell your health care provider if you have a mole that is larger than the size of a pencil eraser.  Always use sunscreen. Apply sunscreen liberally and repeatedly throughout the day.  Protect yourself by wearing long sleeves, pants, a wide-brimmed hat, and sunglasses whenever you are outside.  Heart disease, diabetes, and high blood pressure  High blood pressure causes heart disease and increases the risk of stroke. High blood pressure is more likely to develop in: ? People who have blood pressure in the high end of the normal range (130-139/85-89 mm Hg). ? People who are overweight or obese. ? People who are African American.  If you are 63-42 years of age, have your blood pressure checked every 3-5 years. If you are 24 years of age or older, have your blood pressure checked every year. You should have your blood pressure measured twice-once when you are at a hospital or clinic, and once when you are not at a hospital or clinic. Record the average of the two measurements. To check your blood pressure when you are not  at a hospital or clinic, you can use: ? An automated blood pressure machine at a pharmacy. ? A home blood pressure monitor.  If you are between 66 years and 53 years old, ask your health care provider if you should take aspirin to prevent strokes.  Have regular diabetes screenings. This involves taking a blood sample to check your fasting blood sugar level. ? If you are at a normal weight and have a low risk for diabetes, have this test once every three years after 71 years of age. ? If you are overweight and have a high risk for diabetes, consider being tested at a younger age or more often. Preventing infection Hepatitis B  If you have a higher risk for hepatitis B, you should be screened for this virus. You are considered at high risk for hepatitis B if: ? You were born in a country where hepatitis B is common. Ask your health care provider which countries are considered high risk. ? Your parents were born in a high-risk country, and you have not been immunized against hepatitis B (hepatitis B vaccine). ? You have HIV or AIDS. ? You use needles to inject street drugs. ? You live with someone who has hepatitis B. ? You have had sex with someone  who has hepatitis B. ? You get hemodialysis treatment. ? You take certain medicines for conditions, including cancer, organ transplantation, and autoimmune conditions.  Hepatitis C  Blood testing is recommended for: ? Everyone born from [birth year] through 1964. ? Anyone with known risk factors for hepatitis C.  Sexually transmitted infections (STIs)  You should be screened for sexually transmitted infections (STIs) including gonorrhea and chlamydia if: ? You are sexually active and are younger than 71 years of age. ? You are older than 71 years of age and your health care provider tells you that you are at risk for this type of infection. ? Your sexual activity has changed since you were last screened and you are at an increased risk for chlamydia  or gonorrhea. Ask your health care provider if you are at risk.  If you do not have HIV, but are at risk, it may be recommended that you take a prescription medicine daily to prevent HIV infection. This is called pre-exposure prophylaxis (PrEP). You are considered at risk if: ? You are sexually active and do not regularly use condoms or know the HIV status of your partner(s). ? You take drugs by injection. ? You are sexually active with a partner who has HIV.  Talk with your health care provider about whether you are at high risk of being infected with HIV. If you choose to begin PrEP, you should first be tested for HIV. You should then be tested every 3 months for as long as you are taking PrEP. Pregnancy  If you are premenopausal and you may become pregnant, ask your health care provider about preconception counseling.  If you may become pregnant, take 400 to 800 micrograms (mcg) of folic acid every day.  If you want to prevent pregnancy, talk to your health care provider about birth control (contraception). Osteoporosis and menopause  Osteoporosis is a disease in which the bones lose minerals and strength with aging. This can result in serious bone fractures. Your risk for osteoporosis can be identified using a bone density scan.  If you are 93 years of age or older, or if you are at risk for osteoporosis and fractures, ask your health care provider if you should be screened.  Ask your health care provider whether you should take a calcium or vitamin D supplement to lower your risk for osteoporosis.  Menopause may have certain physical symptoms and risks.  Hormone replacement therapy may reduce some of these symptoms and risks. Talk to your health care provider about whether hormone replacement therapy is right for you. Follow these instructions at home:  Schedule regular health, dental, and eye exams.  Stay current with your immunizations.  Do not use any tobacco products  including cigarettes, chewing tobacco, or electronic cigarettes.  If you are pregnant, do not drink alcohol.  If you are breastfeeding, limit how much and how often you drink alcohol.  Limit alcohol intake to no more than 1 drink per day for nonpregnant women. One drink equals 12 ounces of beer, 5 ounces of wine, or 1 ounces of hard liquor.  Do not use street drugs.  Do not share needles.  Ask your health care provider for help if you need support or information about quitting drugs.  Tell your health care provider if you often feel depressed.  Tell your health care provider if you have ever been abused or do not feel safe at home. This information is not intended to replace advice given to you by your  health care provider. Make sure you discuss any questions you have with your health care provider. Document Released: 02/22/2011 Document Revised: 01/15/2016 Document Reviewed: 05/13/2015 Elsevier Interactive Patient Education  2018 [employer].   IF you received an x-ray today, you will receive an invoice from [facility]. Please contact [facility] at [phone] with questions or concerns regarding your invoice.   IF you received labwork today, you will receive an invoice from [facility]. Please contact [facility] at [phone] with questions or concerns regarding your invoice.   Our billing staff will not be able to assist you with questions regarding bills from these companies.  You will be contacted with the lab results as soon as they are available. The fastest way to get your results is to activate your My Chart account. Instructions are located on the last page of this paperwork. If you have not heard from [place] regarding the results in 2 weeks, please contact this office.        Agustina Caroli, MD [facility]

## 2017-09-06 NOTE — Assessment & Plan Note (Signed)
Stable and under control; will refill Probenecid.

## 2017-09-06 NOTE — Assessment & Plan Note (Signed)
Initially high and repest 138/86; was taken off Lopressor but feels she needs to go back on it; took it for years; has slight tachycardia; will re-start 25 mg bid. Continue Cozaar.

## 2017-09-07 ENCOUNTER — Encounter: Payer: Self-pay | Admitting: Radiology

## 2017-09-07 LAB — BASIC METABOLIC PANEL
BUN / CREAT RATIO: 33 — AB (ref 12–28)
BUN: 25 mg/dL (ref 8–27)
CO2: 24 mmol/L (ref 20–29)
CREATININE: 0.75 mg/dL (ref 0.57–1.00)
Calcium: 9.9 mg/dL (ref 8.7–10.3)
Chloride: 99 mmol/L (ref 96–106)
GFR calc non Af Amer: 81 mL/min/{1.73_m2} (ref >59)
GFR, EST AFRICAN AMERICAN: 93 mL/min/{1.73_m2} (ref >59)
GLUCOSE: 114 mg/dL — AB (ref 65–99)
Potassium: 4.5 mmol/L (ref 3.5–5.2)
SODIUM: 142 mmol/L (ref 134–144)

## 2017-09-07 LAB — LIPID PANEL
CHOL/HDL RATIO: 3.5 ratio (ref 0.0–4.4)
CHOLESTEROL TOTAL: 150 mg/dL (ref 100–199)
HDL: 43 mg/dL (ref >39)
LDL CALC: 60 mg/dL (ref 0–99)
TRIGLYCERIDES: 235 mg/dL — AB (ref 0–149)
VLDL CHOLESTEROL CAL: 47 mg/dL — AB (ref 5–40)

## 2017-09-07 LAB — HEMOGLOBIN A1C
Est. average glucose Bld gHb Est-mCnc: 123 mg/dL
Hgb A1c MFr Bld: 5.9 % — ABNORMAL HIGH (ref 4.8–5.6)

## 2017-09-14 ENCOUNTER — Telehealth: Payer: Self-pay | Admitting: Emergency Medicine

## 2017-09-14 NOTE — Telephone Encounter (Signed)
Copied from CRM [phone]. Topic: Quick Communication - See Telephone Encounter >> Sep 14, 2017  9:35 AM [place], Princella PellegriniJessica D wrote: CRM for notification. See Telephone encounter for:  Gwen from [facility] needs to verify the directions for  probenecid (BENEMID) 500 MG tablet [[id number]][[id number]].  Contact# [phone] [facility]#[id number]#[id number]  09/14/17.

## 2017-09-15 NOTE — Telephone Encounter (Signed)
Patient is following up on this.

## 2017-09-16 ENCOUNTER — Other Ambulatory Visit: Payer: Self-pay | Admitting: Emergency Medicine

## 2017-09-16 ENCOUNTER — Ambulatory Visit (INDEPENDENT_AMBULATORY_CARE_PROVIDER_SITE_OTHER): Payer: Self-pay

## 2017-09-16 ENCOUNTER — Encounter (INDEPENDENT_AMBULATORY_CARE_PROVIDER_SITE_OTHER): Payer: Self-pay | Admitting: Orthopaedic Surgery

## 2017-09-16 ENCOUNTER — Ambulatory Visit (INDEPENDENT_AMBULATORY_CARE_PROVIDER_SITE_OTHER): Payer: Medicare Other | Admitting: Orthopaedic Surgery

## 2017-09-16 VITALS — BP 158/63 | HR 85 | Resp 12 | Ht 64.0 in | Wt 165.0 lb

## 2017-09-16 DIAGNOSIS — R1084 Generalized abdominal pain: Secondary | ICD-10-CM

## 2017-09-16 DIAGNOSIS — Z8739 Personal history of other diseases of the musculoskeletal system and connective tissue: Secondary | ICD-10-CM

## 2017-09-16 DIAGNOSIS — M5440 Lumbago with sciatica, unspecified side: Secondary | ICD-10-CM | POA: Diagnosis not present

## 2017-09-16 DIAGNOSIS — G8929 Other chronic pain: Secondary | ICD-10-CM | POA: Diagnosis not present

## 2017-09-16 DIAGNOSIS — M25559 Pain in unspecified hip: Secondary | ICD-10-CM

## 2017-09-16 MED ORDER — PROBENECID 500 MG PO TABS: 500.0000 mg | ORAL_TABLET | Freq: Two times a day (BID) | ORAL | 1 refills | Status: DC

## 2017-09-16 NOTE — Progress Notes (Signed)
Office Visit Note   Patient: Isabella Dixon           Date of Birth: [birth year]           MRN: [id number] Visit Date: 09/16/2017              Requested by: Georgina Quint, MD [id number] [place], [place] [address] PCP: Georgina Quint, MD   Assessment & Plan: Visit Diagnoses:  1. Chronic bilateral low back pain with sciatica, sciatica laterality unspecified   2. Generalized abdominal pain   3. Pain in joint of pelvic region    Chronic recurrent pain right buttock which may be related to the lumbar spine. We will order physical therapy for the lumbar spine and an MRI scan of the pelvis. At this point I'm not sure the exact etiology of her pain. She seems to have pain out of proportion to what spent previously diagnosed. Her MRI scan of the lumbar spine was predominantly consistent with pathology on the left where she is been asymptomatic. I am concerned she may have some pathology in her pelvis that may create her pain. She's had a prior hysterectomy Plan:   Return in about 1 week (around 09/23/2017), or after MRI pelvis.   Orders:  Orders Placed This Encounter  Procedures  . XR Lumbar Spine 2-3 Views  . XR Pelvis 1-2 Views  . MR Pelvis w/o contrast  . Ambulatory referral to Physical Therapy   No orders of the defined types were placed in this encounter.     Procedures: No procedures performed   Clinical Data: No additional findings.   Subjective: Chief Complaint  Patient presents with  . Lower Back - Sciatica    Isabella Dixon is a 72 y o here today for right sided sciatica for 2 years. She has been to multiple doctors, no real conclusion. Her buttocks hurts when walking.  Isabella Dixon relates a chronic recurring problem with pain that seems to originate in the lumbar spine radiating to the right buttock and referred to the right lower extremity. She first noted insidious onset in 2017. Because of her discomfort she was evaluated in the emergency  room at [facility] facility on [address] in [place]. She was eventually evaluated by Dr. Maggie Font at the urgent care facility. She was eventually referred to Dr. Lovell Sheehan for neurosurgical evaluation. 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 There was disc bulging and moderate to advanced facet arthrosis at both of those levels predominantly on the left side where she was not symptomatic. Over time she was prescribed gabapentin, prednisone, Naprosyn, and tramadol. At one point over a year ago she was at bedrest for 4 months with resolution of her pain. On occasion she's had to use a cane and even a walker because of the significant pain that again seems to be referred from her back to her buttock and into her right leg. On each occasion she's had significant exacerbation of the pain only to have it slowly resolved. At one point she was diagnosed with a "bruised nerve on. She had recurrence of her pain in 2018 and was again placed at bedrest from April to about July. She was seen by Dr. Sharyn Lull, her rheumatologist" for evaluation of gout she was placed on prednisone, Levaquin and hydrocodone for the gout and also for her present pain. On  occasion she notes that she was "screaming hollering and with uncontrollable pain". Over the past number of months she has been asymptomatic but is "beginning to hurt again". Pain seems to originate in the low back referred to the center of her buttock and is far distally as her right foot. She has some difficulty when she sits with pressure on her buttock and also increased pain with standing. He has had an issue with constipation related to her medicines. But no bladder problems. She has not had any injections or physical therapy. She is just frustrated because she's not had a specific diagnosis or has the problem resolved. No history of injury or  trauma.  HPI  Review of Systems  Constitutional: Negative for chills, fatigue and fever.  Eyes: Negative for itching.  Respiratory: Negative for chest tightness and shortness of breath.   Cardiovascular: Negative for chest pain, palpitations and leg swelling.  Gastrointestinal: Negative for blood in stool, constipation and diarrhea.  Endocrine: Negative for polyuria.  Genitourinary: Negative for dysuria.  Musculoskeletal: Positive for back pain. Negative for joint swelling, neck pain and neck stiffness.  Allergic/Immunologic: Negative for immunocompromised state.  Neurological: Negative for dizziness and numbness.  Hematological: Does not bruise/bleed easily.  Psychiatric/Behavioral: Positive for sleep disturbance. The patient is not nervous/anxious.      Objective: Vital Signs: BP (!) 158/63   Pulse 85   Resp 12   Ht 5\' 4"  (1.626 m)   Wt 165 lb (74.8 kg)   BMI 28.32 kg/m   Physical Exam  Ortho Exam awake alert and oriented 3. Comfortable sitting. Presently is not having any pain. Hamstrings appear to be tight. Reflexes were symmetrical both lower extremities. Motor and sensory exam appeared to be intact. No significant pain with internal/external rotation of either hip or either knee. Several areas of mild tenderness about the right buttock but nothing that would localize. She specifically did not have any pain along the ischial tuberosities. No specific tenderness of the lumbar spine.  Specialty Comments:  No specialty comments available.  Imaging: Xr Lumbar Spine 2-3 Views  Result Date: 09/16/2017 Films of lumbar spine obtained in AP lateral projection. There is a very minimal anterior listhesis of L5 on S1. Is decreased joint space between L5-S1 with slight sclerosis at L4-5 and L5-S1. Some calcification abdominal aorta without obvious aneurysmal dilatation. Is also some degenerative disc disease at T10-11 no scoliosis present.  Xr Pelvis 1-2 Views  Result Date:  09/16/2017 AP the pelvis demonstrates some very mild degenerative changes of both hip joints. There is some calcification along the lateral aspect of the acetabulum. Joint spaces appear to be well-maintained. Some formation beneath the femoral heads. Some sclerosis about the sacrum iliac joints. There is also some irregularity along the greater trochanters. Patient's symptoms are not referable to the hip or the greater trochanter. Pain is mostly along the right buttock I don't see any abnormality in the right hemipelvis    PMFS History: Patient Active Problem List   Diagnosis Date Noted  . History of sciatica 09/06/2017  . Type 2 diabetes mellitus without complication, without long-term current use of insulin (HCC) 09/06/2017  . History of gout 09/06/2017  . Diabetes mellitus type 2 in obese (HCC) 05/13/2015  . Hypothyroid 07/25/2012  . Gout 05/02/2012  . Osteoarthritis 05/02/2012  . Hypertension 05/02/2012  . Hyperlipidemia 05/02/2012  . Abnormal pap 05/02/2012  . Hyperglycemia 05/02/2012   Past Medical History:  Diagnosis Date  . Anemia   . Arthritis   .  Diabetes mellitus without complication (HCC)   . Gout   . High cholesterol   . Hypertension   . Osteoporosis     Family History  Problem Relation Age of Onset  . Heart disease Mother   . Stroke Mother   . Hypertension Mother   . Stroke Father   . Cancer Sister   . Cancer Son   . Stroke Paternal Grandmother   . Cancer Paternal Grandmother   . Stroke Maternal Grandmother   . Cancer Paternal Grandfather     Past Surgical History:  Procedure Laterality Date  . ABDOMINAL HYSTERECTOMY    . TUBAL LIGATION     Social History   Occupational History  . Not on file  Tobacco Use  . Smoking status: Never Smoker  . Smokeless tobacco: Never Used  Substance and Sexual Activity  . Alcohol use: No  . Drug use: No  . Sexual activity: Yes

## 2017-09-16 NOTE — Telephone Encounter (Signed)
Sent to Pharmacy with updated instructions.

## 2017-09-28 ENCOUNTER — Ambulatory Visit: Payer: Medicare Other | Attending: Orthopaedic Surgery | Admitting: Physical Therapy

## 2017-09-28 ENCOUNTER — Encounter: Payer: Self-pay | Admitting: Physical Therapy

## 2017-09-28 ENCOUNTER — Other Ambulatory Visit: Payer: Self-pay

## 2017-09-28 DIAGNOSIS — M62838 Other muscle spasm: Secondary | ICD-10-CM | POA: Insufficient documentation

## 2017-09-28 DIAGNOSIS — M6289 Other specified disorders of muscle: Secondary | ICD-10-CM | POA: Diagnosis present

## 2017-09-28 DIAGNOSIS — M545 Low back pain: Secondary | ICD-10-CM | POA: Diagnosis present

## 2017-09-28 DIAGNOSIS — G8929 Other chronic pain: Secondary | ICD-10-CM | POA: Insufficient documentation

## 2017-09-28 DIAGNOSIS — M6281 Muscle weakness (generalized): Secondary | ICD-10-CM | POA: Diagnosis present

## 2017-09-28 NOTE — Therapy (Signed)
[facility] [address] [place], [place], [address] Phone: [phone]   Fax:  [phone]  Physical Therapy Evaluation  Patient Details  Name: Isabella Dixon MRN: [id number] Date of Birth: 28-Mar-1946 Referring Provider: Cleophas Dunker   Encounter Date: 09/28/2017  PT End of Session - 09/28/17 1508    Visit Number  1    Date for PT Re-Evaluation  11/26/17    PT Start Time  1420    PT Stop Time  1505    PT Time Calculation (min)  45 min    Activity Tolerance  Treatment limited secondary to medical complications (Comment)    Behavior During Therapy  [facility] for tasks assessed/performed       Past Medical History:  Diagnosis Date  . Anemia   . Arthritis   . Diabetes mellitus without complication (HCC)   . Gout   . High cholesterol   . Hypertension   . Osteoporosis     Past Surgical History:  Procedure Laterality Date  . ABDOMINAL HYSTERECTOMY    . TUBAL LIGATION      There were no vitals filed for this visit.   Subjective Assessment - 09/28/17 1423    Subjective  Pt. reports pain starting on R pelvis down leg in 2017 so bad that she went on bed rest (not as per dr. request) for 4 months which resolved the pain. Pt. reports this pain coming back around April-July every year since. Pt. reports when in pain having difficulty getting up/walking/going to the bathroom and needing help from AD like a cane or walker.  Pt. had a very busy day yesterday with lots of sitting, driving, and standing and by the end of the day pain was 9/10.    Limitations  Sitting;Standing;Walking    Patient Stated Goals  be able to go bowling without any pain or limitation    Currently in Pain?  Yes    Pain Score  4     Pain Location  Back    Pain Orientation  Right    Pain Descriptors / Indicators  Burning;Stabbing    Pain Type  Chronic pain    Pain Radiating Towards  radiates down leg     Pain Onset  More than a month ago    Pain  Frequency  Constant    Aggravating Factors   standing for 30-45 min, sitting for 15 minutes increases to 9/10     Pain Relieving Factors  lidocane, ice, moving around, aleve decreases to 0/10         [facility] PT Assessment - 09/28/17 0001      Assessment   Medical Diagnosis  pelvic pain    Referring Provider  Cleophas Dunker    Prior Therapy  none      Precautions   Precautions  None      Balance Screen   Has the patient fallen in the past 6 months  No    Has the patient had a decrease in activity level because of a fear of falling?   No    Is the patient reluctant to leave their home because of a fear of falling?   No      Home Environment   Additional Comments  stairs inside and to go into house, housework/yardwork      Prior Function   Level of Independence  Independent    Vocation  Retired    Advertising account executive, English as a second language teacher  ROM / Strength   AROM / PROM / Strength  AROM;PROM;Strength      AROM   Overall AROM Comments  lumbar ROM WFL    AROM Assessment Site  Hip    Right/Left Hip  Right;Left      PROM   PROM Assessment Site  Hip    Right/Left Hip  Right;Left      Strength   Strength Assessment Site  Hip;Knee    Right/Left Hip  Right;Left    Right Hip Flexion  4+/5    Right Hip Extension  3+/5    Right Hip External Rotation   4/5    Right Hip Internal Rotation  4/5    Right Hip ABduction  3+/5    Left Hip Flexion  4+/5    Left Hip Extension  3+/5    Left Hip External Rotation  4/5    Left Hip Internal Rotation  4/5    Left Hip ABduction  3+/5    Right/Left Knee  Right;Left      Flexibility   Soft Tissue Assessment /Muscle Length  yes    Hamstrings  very limited and tight    ITB  very tight    Piriformis  very tight       Palpation   Palpation comment  TTP hamstring muscle belly, piriformis, GT             Objective measurements completed on examination: See above findings.              PT Education - 09/28/17 1507    Education  provided  Yes    Education Details  HEP stretches    Person(s) Educated  Patient    Methods  Explanation;Demonstration;Handout    Comprehension  Verbalized understanding       PT Short Term Goals - 09/28/17 1512      PT SHORT TERM GOAL #1   Title  independent with initial HEP     Time  2    Period  Weeks    Status  New        PT Long Term Goals - 09/28/17 1512      PT LONG TERM GOAL #1   Title  decrease pain 50% for OQL and functional use    Time  8    Period  Weeks    Status  New      PT LONG TERM GOAL #2   Title  reports bowling regularly again with no pain or limitation    Time  8    Period  Weeks    Status  New      PT LONG TERM GOAL #3   Title  reports being able to do normal yardwork  and gardening without pain or limitation    Time  8    Period  Weeks    Status  New             Plan - 09/28/17 1508    Clinical Impression Statement  Pt. very tight piriformis, HS, ITB, that brought on concordant pain. Pt. TTP HS, piriformis, GT. Pt. did not have a leg length discrepancy. Pt. pelvic alginment with forward lumbar flexion was normal. Pt. is a very busy person who has some stress in her life and is always on the go.     Clinical Presentation  Stable    Clinical Presentation due to:  pain since 2017    Clinical Decision Making  Low  Rehab Potential  Good    PT Frequency  2x / week    PT Duration  8 weeks    PT Treatment/Interventions  Electrical Stimulation;Iontophoresis 4mg /ml Dexamethasone;Moist Heat;Traction;Cryotherapy;Therapeutic exercise;Therapeutic activities;Patient/family education;Manual techniques;Dry needling    PT Next Visit Plan  MT PROM/STM, some strengthening    PT Home Exercise Plan  piriformis, quad, HS, ITB stretch    Consulted and Agree with Plan of Care  Patient       Patient will benefit from skilled therapeutic intervention in order to improve the following deficits and impairments:  Pain, Increased muscle spasms, Impaired tone,  Decreased strength, Difficulty walking, Impaired flexibility  Visit Diagnosis: Muscle tone increased  Other muscle spasm  Muscle weakness (generalized)  Chronic bilateral low back pain, with sciatica presence unspecified     Problem List Patient Active Problem List   Diagnosis Date Noted  . History of sciatica 09/06/2017  . Type 2 diabetes mellitus without complication, without long-term current use of insulin (HCC) 09/06/2017  . History of gout 09/06/2017  . Diabetes mellitus type 2 in obese (HCC) 05/13/2015  . Hypothyroid 07/25/2012  . Gout 05/02/2012  . Osteoarthritis 05/02/2012  . Hypertension 05/02/2012  . Hyperlipidemia 05/02/2012  . Abnormal pap 05/02/2012  . Hyperglycemia 05/02/2012  During this treatment session, the therapist was present, participating in and directing the treatment.   Blima Ledger SPT 09/28/2017, 3:32 PM  [facility] [place] [address] [place], [place], [address] Phone: [phone]   Fax:  [phone]  Name: MIU CHIONG MRN: [id number] Date of Birth: 08-12-1946

## 2017-09-30 ENCOUNTER — Ambulatory Visit (INDEPENDENT_AMBULATORY_CARE_PROVIDER_SITE_OTHER): Payer: Medicare Other | Admitting: Orthopaedic Surgery

## 2017-10-03 ENCOUNTER — Ambulatory Visit: Payer: Medicare Other | Admitting: Physical Therapy

## 2017-10-03 ENCOUNTER — Encounter: Payer: Self-pay | Admitting: Physical Therapy

## 2017-10-03 DIAGNOSIS — G8929 Other chronic pain: Secondary | ICD-10-CM

## 2017-10-03 DIAGNOSIS — M6289 Other specified disorders of muscle: Secondary | ICD-10-CM

## 2017-10-03 DIAGNOSIS — M62838 Other muscle spasm: Secondary | ICD-10-CM

## 2017-10-03 DIAGNOSIS — M6281 Muscle weakness (generalized): Secondary | ICD-10-CM

## 2017-10-03 DIAGNOSIS — M545 Low back pain: Secondary | ICD-10-CM

## 2017-10-03 NOTE — Therapy (Signed)
[facility] [address] [place], [place], [address] Phone: [phone]   Fax:  [phone]  Physical Therapy Treatment  Patient Details  Name: Isabella Dixon MRN: [id number] Date of Birth: 26-Oct-1945 Referring Provider: Cleophas DunkerWhitfield   Encounter Date: 10/03/2017  PT End of Session - 10/03/17 1606    Visit Number  2    Date for PT Re-Evaluation  11/26/17    PT Start Time  1505    PT Stop Time  1605    PT Time Calculation (min)  60 min    Activity Tolerance  Patient tolerated treatment well    Behavior During Therapy  [facility] for tasks assessed/performed       Past Medical History:  Diagnosis Date  . Anemia   . Arthritis   . Diabetes mellitus without complication (HCC)   . Gout   . High cholesterol   . Hypertension   . Osteoporosis     Past Surgical History:  Procedure Laterality Date  . ABDOMINAL HYSTERECTOMY    . TUBAL LIGATION      There were no vitals filed for this visit.  Subjective Assessment - 10/03/17 1506    Subjective  Patient reports that she has been pretty sore in the mms of the buttocks.  Reports that she felt like she got up out of a chair better.    Currently in Pain?  Yes    Pain Score  4     Pain Location  Hip    Pain Orientation  Right;Left                      [facility] Adult PT Treatment/Exercise - 10/03/17 0001      Exercises   Exercises  Lumbar      Lumbar Exercises: Stretches   Passive Hamstring Stretch  3 reps;20 seconds;Right;Left    Quad Stretch  3 reps;10 seconds;Right;Left    Piriformis Stretch  4 reps;20 seconds;Right;Left      Lumbar Exercises: Aerobic   Nustep  level 4 x 5 minutes      Lumbar Exercises: Machines for Strengthening   Other Lumbar Machine Exercise  5# straight arm pulls for core activation, 5# AR press for core      Lumbar Exercises: Supine   Other Supine Lumbar Exercises  feet on ball K2C, trunk rotation, small bridges, isometric  abdominals      Modalities   Modalities  Cryotherapy;Electrical Stimulation      Cryotherapy   Number Minutes Cryotherapy  15 Minutes    Cryotherapy Location  Lumbar Spine    Type of Cryotherapy  Ice pack      Electrical Stimulation   Electrical Stimulation Location  L/S area    Electrical Stimulation Action  IFC    Electrical Stimulation Parameters  supine    Electrical Stimulation Goals  Pain             PT Education - 10/03/17 1557    Education provided  Yes    Education Details  reviewed the stretches and tweaked them a little for her especially the quad and piriformis stretches    Person(s) Educated  Patient    Methods  Explanation;Verbal cues    Comprehension  Verbalized understanding;Returned demonstration;Tactile cues required;Verbal cues required       PT Short Term Goals - 10/03/17 1613      PT SHORT TERM GOAL #1   Title  independent with initial HEP  Status  On-going        PT Long Term Goals - 09/28/17 1512      PT LONG TERM GOAL #1   Title  decrease pain 50% for OQL and functional use    Time  8    Period  Weeks    Status  New      PT LONG TERM GOAL #2   Title  reports bowling regularly again with no pain or limitation    Time  8    Period  Weeks    Status  New      PT LONG TERM GOAL #3   Title  reports being able to do normal yardwork  and gardening without pain or limitation    Time  8    Period  Weeks    Status  New            Plan - 10/03/17 1611    Clinical Impression Statement  Patient tolerated the exercises well, she did have difficulty getting abdominal contraction, needed verbal and tactile cues, she is very tight in the piriformis mms.  She had some vague c/o pain with the exercises that we did today but nothing in the piriformis    PT Next Visit Plan  see how the exercises, stretches and the estim did    Consulted and Agree with Plan of Care  Patient       Patient will benefit from skilled therapeutic intervention  in order to improve the following deficits and impairments:  Pain, Increased muscle spasms, Impaired tone, Decreased strength, Difficulty walking, Impaired flexibility  Visit Diagnosis: Muscle tone increased  Other muscle spasm  Muscle weakness (generalized)  Chronic bilateral low back pain, with sciatica presence unspecified     Problem List Patient Active Problem List   Diagnosis Date Noted  . History of sciatica 09/06/2017  . Type 2 diabetes mellitus without complication, without long-term current use of insulin (HCC) 09/06/2017  . History of gout 09/06/2017  . Diabetes mellitus type 2 in obese (HCC) 05/13/2015  . Hypothyroid 07/25/2012  . Gout 05/02/2012  . Osteoarthritis 05/02/2012  . Hypertension 05/02/2012  . Hyperlipidemia 05/02/2012  . Abnormal pap 05/02/2012  . Hyperglycemia 05/02/2012    Jearld Lesch. PT 10/03/2017, 4:14 PM  [facility] [address] [place], [place], [address] Phone: [phone]   Fax:  [phone]  Name: Isabella Dixon MRN: [id number] Date of Birth: [birth year]

## 2017-10-06 ENCOUNTER — Ambulatory Visit: Payer: Medicare Other | Admitting: Physical Therapy

## 2017-10-06 DIAGNOSIS — G8929 Other chronic pain: Secondary | ICD-10-CM

## 2017-10-06 DIAGNOSIS — M6281 Muscle weakness (generalized): Secondary | ICD-10-CM

## 2017-10-06 DIAGNOSIS — M6289 Other specified disorders of muscle: Secondary | ICD-10-CM

## 2017-10-06 DIAGNOSIS — M545 Low back pain: Secondary | ICD-10-CM

## 2017-10-06 DIAGNOSIS — M62838 Other muscle spasm: Secondary | ICD-10-CM

## 2017-10-06 NOTE — Therapy (Signed)
[facility] [place] [address] [place], [place], [address] Phone: [phone]   Fax:  [phone]  Physical Therapy Treatment  Patient Details  Name: Isabella Dixon MRN: [id number] Date of Birth: 04/15/1946 Referring Provider: Cleophas Dunker   Encounter Date: 10/06/2017  PT End of Session - 10/06/17 0951    Visit Number  3    Date for PT Re-Evaluation  11/26/17    PT Start Time  0920    PT Stop Time  1013    PT Time Calculation (min)  53 min       Past Medical History:  Diagnosis Date  . Anemia   . Arthritis   . Diabetes mellitus without complication (HCC)   . Gout   . High cholesterol   . Hypertension   . Osteoporosis     Past Surgical History:  Procedure Laterality Date  . ABDOMINAL HYSTERECTOMY    . TUBAL LIGATION      There were no vitals filed for this visit.  Subjective Assessment - 10/06/17 0925    Subjective  sore but better    Currently in Pain?  Yes    Pain Score  2     Pain Location  Buttocks                      [facility] Adult PT Treatment/Exercise - 10/06/17 0001      Lumbar Exercises: Aerobic   Nustep  level 4 x 6 minutes LE only      Lumbar Exercises: Machines for Strengthening   Cybex Lumbar Extension  black band 15 ext and flex tactile cuing needed    Cybex Knee Extension  10# 1 sets 10 vry ftaigued cuing for TKE    Cybex Knee Flexion  20# 2 sets 10    Other Lumbar Machine Exercise  10# straight arm pulls       Lumbar Exercises: Supine   Ab Set  15 reps    Clam  15 reps green tband    Bent Knee Raise  15 reps green tband    Bridge  15 reps;3 seconds with ball    Bridge with [employer]  15 reps      Modalities   Modalities  Cryotherapy;Electrical Stimulation      Cryotherapy   Number Minutes Cryotherapy  15 Minutes    Cryotherapy Location  Lumbar Spine    Type of Cryotherapy  Ice pack      Electrical Stimulation   Electrical Stimulation Location  L/S area     Electrical Stimulation Action  IFC    Electrical Stimulation Parameters  supine    Electrical Stimulation Goals  Pain               PT Short Term Goals - 10/06/17 0953      PT SHORT TERM GOAL #1   Title  independent with initial HEP     Baseline  stretches    Status  Achieved        PT Long Term Goals - 09/28/17 1512      PT LONG TERM GOAL #1   Title  decrease pain 50% for OQL and functional use    Time  8    Period  Weeks    Status  New      PT LONG TERM GOAL #2   Title  reports bowling regularly again with no pain or limitation    Time  8  Period  Weeks    Status  New      PT LONG TERM GOAL #3   Title  reports being able to do normal yardwork  and gardening without pain or limitation    Time  8    Period  Weeks    Status  New            Plan - 10/06/17 [id number]    Clinical Impression Statement  verb and tactile cuing needed with ther ex today for correct control and muscle activation, tolerated ex progression with out increased pain     PT Treatment/Interventions  Electrical Stimulation;Iontophoresis 4mg /ml Dexamethasone;Moist Heat;Traction;Cryotherapy;Therapeutic exercise;Therapeutic activities;Patient/family education;Manual techniques;Dry needling    PT Next Visit Plan  progress strengthening of core       Patient will benefit from skilled therapeutic intervention in order to improve the following deficits and impairments:  Pain, Increased muscle spasms, Impaired tone, Decreased strength, Difficulty walking, Impaired flexibility  Visit Diagnosis: Other muscle spasm  Muscle tone increased  Muscle weakness (generalized)  Chronic bilateral low back pain, with sciatica presence unspecified     Problem List Patient Active Problem List   Diagnosis Date Noted  . History of sciatica 09/06/2017  . Type 2 diabetes mellitus without complication, without long-term current use of insulin (HCC) 09/06/2017  . History of gout 09/06/2017  . Diabetes  mellitus type 2 in obese (HCC) 05/13/2015  . Hypothyroid 07/25/2012  . Gout 05/02/2012  . Osteoarthritis 05/02/2012  . Hypertension 05/02/2012  . Hyperlipidemia 05/02/2012  . Abnormal pap 05/02/2012  . Hyperglycemia 05/02/2012    Abryana Lykens,ANGIE PTA 10/06/2017, 9:56 AM  [facility] [place] [address] [place], [place], [address] Phone: [phone]   Fax:  [phone]  Name: Isabella SquibbCarolyn K Dixon MRN: [id number] Date of Birth: 1946/03/27

## 2017-10-07 ENCOUNTER — Ambulatory Visit
Admission: RE | Admit: 2017-10-07 | Discharge: 2017-10-07 | Disposition: A | Discharge status: Home / Self Care | Payer: Medicare Other | Source: Ambulatory Visit | Attending: Orthopaedic Surgery | Admitting: Orthopaedic Surgery

## 2017-10-07 DIAGNOSIS — R1084 Generalized abdominal pain: Secondary | ICD-10-CM

## 2017-10-10 ENCOUNTER — Ambulatory Visit (INDEPENDENT_AMBULATORY_CARE_PROVIDER_SITE_OTHER): Payer: Medicare Other | Admitting: Orthopaedic Surgery

## 2017-10-10 ENCOUNTER — Encounter (INDEPENDENT_AMBULATORY_CARE_PROVIDER_SITE_OTHER): Payer: Self-pay | Admitting: Orthopaedic Surgery

## 2017-10-10 VITALS — Resp 16 | Ht 64.0 in | Wt 185.0 lb

## 2017-10-10 DIAGNOSIS — M25552 Pain in left hip: Secondary | ICD-10-CM

## 2017-10-10 DIAGNOSIS — M25551 Pain in right hip: Secondary | ICD-10-CM | POA: Diagnosis not present

## 2017-10-10 DIAGNOSIS — G8929 Other chronic pain: Secondary | ICD-10-CM | POA: Diagnosis not present

## 2017-10-10 DIAGNOSIS — M545 Low back pain: Secondary | ICD-10-CM

## 2017-10-10 NOTE — Progress Notes (Signed)
Office Visit Note   Patient: Isabella Dixon           Date of Birth: Jan 25, 1946           MRN: [id number] Visit Date: 10/10/2017              Requested by: Georgina QuintSagardia, Miguel Jose, MD [address] [place], [place] [address] PCP: Georgina QuintSagardia, Miguel Jose, MD   Assessment & Plan: Visit Diagnoses:  1. Chronic bilateral low back pain without sciatica   2. Pain of both hip joints     Plan: Isabella Dixon has had recurrent episodes of back, pelvis and leg pain over a  period of years. She had an MRI scan of the lumbar spine in 2017 demonstrating areas of facet arthritis and degenerative changes and possible nerve root compression on the left. More recently she's had trouble bilaterally. She notes that for months she was basically "bedridden". She's feeling better but was just concerned about her pain. I ordered an MRI scan of the pelvis. The findings revealed minimal degenerative changes of both hips and possibly low-grade partial tearing of the gluteus medius and minimus tendons. Presently asymptomatic. Long discussion about what she may expect in the future. I think the problem is related to her back rather than her pelvis. She has seen Dr. Lovell SheehanJenkins in neurosurgery in the past and that's a good resource. She's also seeing Dr.  Alvy BimlerSagardia [facility] as needed. She has been involved in physical therapy and feels like it's making a difference. I would urge her to continue with and follow up with a home exercise program  Follow-Up Instructions: Return if symptoms worsen or fail to improve.   Orders:  No orders of the defined types were placed in this encounter.  No orders of the defined types were placed in this encounter.     Procedures: No procedures performed   Clinical Data: No additional findings.   Subjective: Chief Complaint  Patient presents with  . Results    MRI results   Presently asymptomatic. Has had recurrent episodes of back, pelvis and hip pain. Prior  lumbar spine MRI scan consistent with degenerative changes throughout the lumbar spine and possible nerve root compression on the left. Pelvis MRI scan revealed minimal degenerative changes of both hips and possibly some low-grade tearing of the bilateral gluteus medius and minimus tendons.  HPI  Review of Systems  Constitutional: Negative for chills, fatigue and fever.  HENT: Negative for hearing loss and tinnitus.   Eyes: Negative for itching.  Respiratory: Negative for chest tightness and shortness of breath.   Cardiovascular: Negative for chest pain, palpitations and leg swelling.  Gastrointestinal: Negative for blood in stool, constipation and diarrhea.  Endocrine: Negative for polyuria.  Genitourinary: Negative for dysuria.  Musculoskeletal: Negative for back pain, joint swelling, neck pain and neck stiffness.  Allergic/Immunologic: Negative for immunocompromised state.  Neurological: Negative for dizziness, numbness and headaches.  Hematological: Does not bruise/bleed easily.  Psychiatric/Behavioral: Negative for sleep disturbance. The patient is not nervous/anxious.      Objective: Vital Signs: Resp 16   Ht 5\' 4"  (1.626 m)   Wt 185 lb (83.9 kg)   BMI 31.76 kg/m   Physical Exam  Ortho Exam awake alert and oriented 3. Comfortable sitting. Walks long limp. Straight leg raise negative bilaterally. Painless range of motion both hips. No calf pain. Neurovascular exam intact. No percussible back discomfort.  Specialty Comments:  No specialty comments available.  Imaging: No results  found.   PMFS History: Patient Active Problem List   Diagnosis Date Noted  . History of sciatica 09/06/2017  . Type 2 diabetes mellitus without complication, without long-term current use of insulin (HCC) 09/06/2017  . History of gout 09/06/2017  . Diabetes mellitus type 2 in obese (HCC) 05/13/2015  . Hypothyroid 07/25/2012  . Gout 05/02/2012  . Osteoarthritis 05/02/2012  . Hypertension  05/02/2012  . Hyperlipidemia 05/02/2012  . Abnormal pap 05/02/2012  . Hyperglycemia 05/02/2012   Past Medical History:  Diagnosis Date  . Anemia   . Arthritis   . Diabetes mellitus without complication (HCC)   . Gout   . High cholesterol   . Hypertension   . Osteoporosis     Family History  Problem Relation Age of Onset  . Heart disease Mother   . Stroke Mother   . Hypertension Mother   . Stroke Father   . Cancer Sister   . Cancer Son   . Stroke Paternal Grandmother   . Cancer Paternal Grandmother   . Stroke Maternal Grandmother   . Cancer Paternal Grandfather     Past Surgical History:  Procedure Laterality Date  . ABDOMINAL HYSTERECTOMY    . TUBAL LIGATION     Social History   Occupational History  . Not on file  Tobacco Use  . Smoking status: Never Smoker  . Smokeless tobacco: Never Used  Substance and Sexual Activity  . Alcohol use: No  . Drug use: No  . Sexual activity: Yes

## 2017-10-12 ENCOUNTER — Encounter: Payer: Self-pay | Admitting: Physical Therapy

## 2017-10-12 ENCOUNTER — Ambulatory Visit: Payer: Medicare Other | Admitting: Physical Therapy

## 2017-10-12 DIAGNOSIS — M6289 Other specified disorders of muscle: Secondary | ICD-10-CM

## 2017-10-12 DIAGNOSIS — M545 Low back pain: Secondary | ICD-10-CM

## 2017-10-12 DIAGNOSIS — M6281 Muscle weakness (generalized): Secondary | ICD-10-CM

## 2017-10-12 DIAGNOSIS — G8929 Other chronic pain: Secondary | ICD-10-CM

## 2017-10-12 DIAGNOSIS — M62838 Other muscle spasm: Secondary | ICD-10-CM

## 2017-10-12 NOTE — Therapy (Signed)
[facility] [address] [place], [place], [address] Phone: [phone]   Fax:  [phone]  Physical Therapy Treatment  Patient Details  Name: Isabella Dixon MRN: [id number] Date of Birth: Jan 11, 1946 Referring Provider: Cleophas DunkerWhitfield   Encounter Date: 10/12/2017  PT End of Session - 10/12/17 1005    Visit Number  4    Date for PT Re-Evaluation  11/26/17    PT Start Time  0926    PT Stop Time  1020    PT Time Calculation (min)  54 min    Activity Tolerance  Patient tolerated treatment well    Behavior During Therapy  [facility] for tasks assessed/performed       Past Medical History:  Diagnosis Date  . Anemia   . Arthritis   . Diabetes mellitus without complication (HCC)   . Gout   . High cholesterol   . Hypertension   . Osteoporosis     Past Surgical History:  Procedure Laterality Date  . ABDOMINAL HYSTERECTOMY    . TUBAL LIGATION      There were no vitals filed for this visit.  Subjective Assessment - 10/12/17 0924    Subjective  Pt reports that she feels ok, went bowling yesterday not having some pain ans soreness in buttock area    Currently in Pain?  Yes    Pain Score  4     Pain Location  Buttocks    Pain Orientation  Right;Left                      [facility] Adult PT Treatment/Exercise - 10/12/17 0001      Lumbar Exercises: Aerobic   Nustep  level 4 x 6 minutes      Lumbar Exercises: Machines for Strengthening   Cybex Knee Extension  10# 1 sets 10    Cybex Knee Flexion  20# 2 sets 10      Lumbar Exercises: Standing   Row  Theraband;10 reps;Both x2    Theraband Level (Row)  Level 3 (Green);Level 2 (Red)    Other Standing Lumbar Exercises  step ups 6in x10 each       Lumbar Exercises: Seated   Sit to Stand  10 reps x2 no UE     Other Seated Lumbar Exercises  ab sets with Pball 2x10      Lumbar Exercises: Supine   Bridge  3 seconds;10 reps x2    Bridge with March  10 reps c2     Other Supine Lumbar Exercises  feet on ball K2C, trunk rotation, small bridges, isometric abdominals      Modalities   Modalities  Cryotherapy;Electrical Stimulation      Cryotherapy   Number Minutes Cryotherapy  15 Minutes    Cryotherapy Location  Lumbar Spine    Type of Cryotherapy  Ice pack      Electrical Stimulation   Electrical Stimulation Location  L/S area    Electrical Stimulation Action  IFC    Electrical Stimulation Parameters  supine    Electrical Stimulation Goals  Pain               PT Short Term Goals - 10/06/17 0953      PT SHORT TERM GOAL #1   Title  independent with initial HEP     Baseline  stretches    Status  Achieved        PT Long Term Goals - 10/12/17  1007      PT LONG TERM GOAL #1   Title  decrease pain 50% for OQL and functional use    Status  On-going      PT LONG TERM GOAL #2   Period  Weeks    Status  On-going      PT LONG TERM GOAL #3   Title  reports being able to do normal yardwork  and gardening without pain or limitation            Plan - 10/12/17 1005    Clinical Impression Statement  Verbal cues needed for core activation, cues also needed for TKE on seated leg press. Pt tends to allow the back of knees to hit mat table during sit to stand but able to correct with cues    PT Treatment/Interventions  Electrical Stimulation;Iontophoresis 4mg /ml Dexamethasone;Moist Heat;Traction;Cryotherapy;Therapeutic exercise;Therapeutic activities;Patient/family education;Manual techniques;Dry needling    PT Next Visit Plan  progress strengthening of core       Patient will benefit from skilled therapeutic intervention in order to improve the following deficits and impairments:  Pain, Increased muscle spasms, Impaired tone, Decreased strength, Difficulty walking, Impaired flexibility  Visit Diagnosis: Muscle tone increased  Other muscle spasm  Muscle weakness (generalized)  Chronic bilateral low back pain, with sciatica  presence unspecified     Problem List Patient Active Problem List   Diagnosis Date Noted  . History of sciatica 09/06/2017  . Type 2 diabetes mellitus without complication, without long-term current use of insulin (HCC) 09/06/2017  . History of gout 09/06/2017  . Diabetes mellitus type 2 in obese (HCC) 05/13/2015  . Hypothyroid 07/25/2012  . Gout 05/02/2012  . Osteoarthritis 05/02/2012  . Hypertension 05/02/2012  . Hyperlipidemia 05/02/2012  . Abnormal pap 05/02/2012  . Hyperglycemia 05/02/2012    Grayce Sessions, PTA 10/12/2017, 10:08 AM  [facility] [place] [facility] [address] [place], [place], [address] Phone: [phone]   Fax:  [phone]  Name: Isabella Dixon MRN: [id number] Date of Birth: Mar 25, 1946

## 2017-10-14 ENCOUNTER — Ambulatory Visit: Payer: Medicare Other | Admitting: Physical Therapy

## 2017-10-14 DIAGNOSIS — M6281 Muscle weakness (generalized): Secondary | ICD-10-CM

## 2017-10-14 DIAGNOSIS — M545 Low back pain: Secondary | ICD-10-CM

## 2017-10-14 DIAGNOSIS — M6289 Other specified disorders of muscle: Secondary | ICD-10-CM | POA: Diagnosis not present

## 2017-10-14 DIAGNOSIS — G8929 Other chronic pain: Secondary | ICD-10-CM

## 2017-10-14 DIAGNOSIS — M62838 Other muscle spasm: Secondary | ICD-10-CM

## 2017-10-14 NOTE — Therapy (Signed)
[place], [place], [address] Phone: [phone]   Fax:  [phone]  Physical Therapy Treatment  Patient Details  Name: Isabella Dixon MRN: [id number] Date of Birth: [birth year] Referring Provider: Durward Fortes   Encounter Date: 10/14/2017  PT End of Session - 10/14/17 0950    Visit Number  5    Date for PT Re-Evaluation  11/26/17    PT Start Time  0925    PT Stop Time  1020    PT Time Calculation (min)  55 min       Past Medical History:  Diagnosis Date  . Anemia   . Arthritis   . Diabetes mellitus without complication ([place])   . Gout   . High cholesterol   . Hypertension   . Osteoporosis     Past Surgical History:  Procedure Laterality Date  . ABDOMINAL HYSTERECTOMY    . TUBAL LIGATION      There were no vitals filed for this visit.  Subjective Assessment - 10/14/17 0929    Subjective  overall PT is helping. I think the knee ext machine increased pain last session    Currently in Pain?  Yes    Pain Score  4     Pain Location  Buttocks                      [facility] Adult PT Treatment/Exercise - 10/14/17 0001      Lumbar Exercises: Aerobic   Nustep  level 4 x 6 minutes      Lumbar Exercises: Machines for Strengthening   Cybex Lumbar Extension  black band 15 ext and flex    Other Lumbar Machine Exercise  row and lat 20# 2 sets 10      Lumbar Exercises: Standing   Other Standing Lumbar Exercises  red tband hip 3 way 15 times each    Other Standing Lumbar Exercises  wt ball OH ext and obl 10 each      Lumbar Exercises: Seated   Sit to Stand  10 reps wt ball      Cryotherapy   Number Minutes Cryotherapy  15 Minutes    Cryotherapy Location  Lumbar Spine    Type of Cryotherapy  Ice pack      Electrical Stimulation   Electrical Stimulation Location  L/S area    Electrical Stimulation Action  IFC    Electrical Stimulation Parameters  supine    Electrical  Stimulation Goals  Pain               PT Short Term Goals - 10/06/17 0953      PT SHORT TERM GOAL #1   Title  independent with initial HEP     Baseline  stretches    Status  Achieved        PT Long Term Goals - 10/14/17 0950      PT LONG TERM GOAL #1   Title  decrease pain 50% for OQL and functional use    Status  Partially Met      PT LONG TERM GOAL #2   Title  reports bowling regularly again with no pain or limitation    Status  Partially Met      PT LONG TERM GOAL #3   Title  reports being able to do normal yardwork  and gardening without pain or limitation    Status  On-going  Plan - 10/14/17 0951    Clinical Impression Statement  postural cuing needed with ther ex and cued to increase core activation. pt verb bowled 1st time in 2 years with soreness but no severe increase in pain    PT Treatment/Interventions  Electrical Stimulation;Iontophoresis 43m/ml Dexamethasone;Moist Heat;Traction;Cryotherapy;Therapeutic exercise;Therapeutic activities;Patient/family education;Manual techniques;Dry needling    PT Next Visit Plan  progress strengthening of core       Patient will benefit from skilled therapeutic intervention in order to improve the following deficits and impairments:  Pain, Increased muscle spasms, Impaired tone, Decreased strength, Difficulty walking, Impaired flexibility  Visit Diagnosis: Muscle tone increased  Other muscle spasm  Muscle weakness (generalized)  Chronic bilateral low back pain, with sciatica presence unspecified     Problem List Patient Active Problem List   Diagnosis Date Noted  . History of sciatica 09/06/2017  . Type 2 diabetes mellitus without complication, without long-term current use of insulin ([facility] 09/06/2017  . History of gout 09/06/2017  . Diabetes mellitus type 2 in obese ([place] 05/13/2015  . Hypothyroid 07/25/2012  . Gout 05/02/2012  . Osteoarthritis 05/02/2012  . Hypertension 05/02/2012  .  Hyperlipidemia 05/02/2012  . Abnormal pap 05/02/2012  . Hyperglycemia 05/02/2012    Keni Wafer,ANGIE PTA 10/14/2017, 10:00 AM  [address] [address]: [phone]  Fax:  [phone] Name: CVINCENT EHRLERMRN: [id number] of Birth: [birth year]

## 2017-10-19 ENCOUNTER — Ambulatory Visit: Payer: Medicare Other | Admitting: Physical Therapy

## 2017-10-19 DIAGNOSIS — M6281 Muscle weakness (generalized): Secondary | ICD-10-CM

## 2017-10-19 DIAGNOSIS — M62838 Other muscle spasm: Secondary | ICD-10-CM

## 2017-10-19 DIAGNOSIS — M6289 Other specified disorders of muscle: Secondary | ICD-10-CM | POA: Diagnosis not present

## 2017-10-19 DIAGNOSIS — M545 Low back pain: Secondary | ICD-10-CM

## 2017-10-19 DIAGNOSIS — G8929 Other chronic pain: Secondary | ICD-10-CM

## 2017-10-19 NOTE — Therapy (Signed)
[place], [place], [address] Phone: [phone]   Fax:  [phone]  Physical Therapy Treatment  Patient Details  Name: Isabella Dixon MRN: [id number] Date of Birth: 09-Apr-1946 Referring Provider: Durward Fortes   Encounter Date: 10/19/2017  PT End of Session - 10/19/17 0954    Visit Number  6    Date for PT Re-Evaluation  11/26/17    PT Start Time  0920    PT Stop Time  1015    PT Time Calculation (min)  55 min       Past Medical History:  Diagnosis Date  . Anemia   . Arthritis   . Diabetes mellitus without complication ([place])   . Gout   . High cholesterol   . Hypertension   . Osteoporosis     Past Surgical History:  Procedure Laterality Date  . ABDOMINAL HYSTERECTOMY    . TUBAL LIGATION      There were no vitals filed for this visit.  Subjective Assessment - 10/19/17 0919    Subjective  increased pain BIL but RT greater than left and down back left leg. pt verb cut and put up wood yesterday. Pt also verb this is teh time of year her back acts up,"maybe it is seasonal"    Currently in Pain?  Yes    Pain Score  6     Pain Location  Buttocks                      [facility] Adult PT Treatment/Exercise - 10/19/17 0001      Lumbar Exercises: Aerobic   Nustep  level 4 x 6 minutes      Lumbar Exercises: Standing   Row  Theraband;Both;Strengthening;15 reps    Shoulder Extension  Strengthening;Theraband;15 reps    Theraband Level (Shoulder Extension)  Level 3 (Green)    Other Standing Lumbar Exercises  red tband hip 3 way 15 times each    Other Standing Lumbar Exercises  wt ball OH ext and obl 10 each      Lumbar Exercises: Supine   Clam  20 reps;3 seconds green tband    Bent Knee Raise  20 reps;3 seconds green tband    Bridge with Ball Squeeze  15 reps;3 seconds      Cryotherapy   Number Minutes Cryotherapy  15 Minutes    Cryotherapy Location  Lumbar Spine    Type of Cryotherapy  Ice pack      Electrical Stimulation   Electrical Stimulation Location  L/S area    Electrical Stimulation Action  IFC    Electrical Stimulation Parameters  supine    Electrical Stimulation Goals  Pain      Manual Therapy   Manual Therapy  Passive ROM    Manual therapy comments  piriformis tightness noted    Passive ROM  LE and trunk             PT Education - 10/19/17 [phone]    Education provided  Yes    Education Details  proper bending and lifting educ    Person(s) Educated  Patient    Methods  Explanation;Demonstration    Comprehension  Verbalized understanding       PT Short Term Goals - 10/06/17 0953      PT SHORT TERM GOAL #1   Title  independent with initial HEP     Baseline  stretches    Status  Achieved        PT Long Term Goals - 10/19/17 0954      PT LONG TERM GOAL #1   Title  decrease pain 50% for OQL and functional use    Status  Partially Met      PT LONG TERM GOAL #2   Title  reports bowling regularly again with no pain or limitation    Status  Partially Met      PT LONG TERM GOAL #3   Title  reports being able to do normal yardwork  and gardening without pain or limitation    Status  Partially Met            Plan - 10/19/17 0955    Clinical Impression Statement  pt verb bowling and putting up this week. pt verb increased pain and fearful of bad pain returning as "this is the season it always bothers me" Pt needs cuing for core activation with ther ex. Piriformis tightness noted but overall flexibility was good.    PT Treatment/Interventions  Electrical Stimulation;Iontophoresis 74m/ml Dexamethasone;Moist Heat;Traction;Cryotherapy;Therapeutic exercise;Therapeutic activities;Patient/family education;Manual techniques;Dry needling    PT Next Visit Plan  progress strengthening of core and BM and lifting training       Patient will benefit from skilled therapeutic intervention in order to improve the following deficits  and impairments:  Pain, Increased muscle spasms, Impaired tone, Decreased strength, Difficulty walking, Impaired flexibility  Visit Diagnosis: Muscle tone increased  Other muscle spasm  Muscle weakness (generalized)  Chronic bilateral low back pain, with sciatica presence unspecified     Problem List Patient Active Problem List   Diagnosis Date Noted  . History of sciatica 09/06/2017  . Type 2 diabetes mellitus without complication, without long-term current use of insulin ([place] 09/06/2017  . History of gout 09/06/2017  . Diabetes mellitus type 2 in obese ([place] 05/13/2015  . Hypothyroid 07/25/2012  . Gout 05/02/2012  . Osteoarthritis 05/02/2012  . Hypertension 05/02/2012  . Hyperlipidemia 05/02/2012  . Abnormal pap 05/02/2012  . Hyperglycemia 05/02/2012    Isabella Dixon,Isabella Dixon  PTA 10/19/2017, 9:57 AM  [address] [address]Phone: [phone]  Fax:  [phone] Name: Isabella CITRONMRN: [id number] of Birth: [birth year]

## 2017-10-25 ENCOUNTER — Ambulatory Visit: Payer: Medicare Other | Attending: Orthopaedic Surgery | Admitting: Physical Therapy

## 2017-10-25 ENCOUNTER — Encounter: Payer: Self-pay | Admitting: Physical Therapy

## 2017-10-25 DIAGNOSIS — M545 Low back pain: Secondary | ICD-10-CM | POA: Diagnosis present

## 2017-10-25 DIAGNOSIS — G8929 Other chronic pain: Secondary | ICD-10-CM | POA: Insufficient documentation

## 2017-10-25 DIAGNOSIS — M6281 Muscle weakness (generalized): Secondary | ICD-10-CM | POA: Diagnosis present

## 2017-10-25 DIAGNOSIS — M6289 Other specified disorders of muscle: Secondary | ICD-10-CM

## 2017-10-25 DIAGNOSIS — M62838 Other muscle spasm: Secondary | ICD-10-CM | POA: Diagnosis present

## 2017-10-25 NOTE — Therapy (Signed)
[place], [place], [address] Phone: [phone]   Fax:  [phone]  Physical Therapy Treatment  Patient Details  Name: Isabella Dixon MRN: [id number] Date of Birth: [birth year] Referring Provider: Durward Fortes   Encounter Date: 10/25/2017  PT End of Session - 10/25/17 0924    Visit Number  7    Date for PT Re-Evaluation  11/26/17    PT Start Time  0840    PT Stop Time  0940    PT Time Calculation (min)  60 min       Past Medical History:  Diagnosis Date  . Anemia   . Arthritis   . Diabetes mellitus without complication ([place])   . Gout   . High cholesterol   . Hypertension   . Osteoporosis     Past Surgical History:  Procedure Laterality Date  . ABDOMINAL HYSTERECTOMY    . TUBAL LIGATION      There were no vitals filed for this visit.  Subjective Assessment - 10/25/17 0841    Subjective  doing pretty good this morning, even put up some wood yesterday    Currently in Pain?  Yes    Pain Score  4     Pain Location  Back    Pain Orientation  Right;Left                      [facility] Adult PT Treatment/Exercise - 10/25/17 0001      Lumbar Exercises: Aerobic   Nustep  level 5 x 6 minutes      Lumbar Exercises: Machines for Strengthening   Cybex Lumbar Extension  black band 15 ext and flex cuing needed for correct technic    Leg Press  30# 2 sets 15 ab sets with ball 20x    Other Lumbar Machine Exercise  row and lat 20# 2 sets 10 postural cuing needed      Lumbar Exercises: Standing   Heel Raises  20 reps black bar    Other Standing Lumbar Exercises  cable hip pulleys 10each  4 ways 6 inch step ups fwd and SW 10 each    Other Standing Lumbar Exercises  wt ball OH ext and obl 15 each      Cryotherapy   Number Minutes Cryotherapy  15 Minutes    Cryotherapy Location  Lumbar Spine    Type of Cryotherapy  Ice pack      Electrical Stimulation   Electrical  Stimulation Location  L/S area    Electrical Stimulation Action  IFC    Electrical Stimulation Parameters  supine    Electrical Stimulation Goals  Pain               PT Short Term Goals - 10/06/17 0953      PT SHORT TERM GOAL #1   Title  independent with initial HEP     Baseline  stretches    Status  Achieved        PT Long Term Goals - 10/19/17 0954      PT LONG TERM GOAL #1   Title  decrease pain 50% for OQL and functional use    Status  Partially Met      PT LONG TERM GOAL #2   Title  reports bowling regularly again with no pain or limitation    Status  Partially Met      PT LONG TERM GOAL #3  Title  reports being able to do normal yardwork  and gardening without pain or limitation    Status  Partially Met            Plan - 10/25/17 0925    Clinical Impression Statement  increased cuing needed for posture and control of ex today. no increased pain noted.     PT Treatment/Interventions  Electrical Stimulation;Iontophoresis 23m/ml Dexamethasone;Moist Heat;Traction;Cryotherapy;Therapeutic exercise;Therapeutic activities;Patient/family education;Manual techniques;Dry needling    PT Next Visit Plan  BM/lifting and assess goals       Patient will benefit from skilled therapeutic intervention in order to improve the following deficits and impairments:  Pain, Increased muscle spasms, Impaired tone, Decreased strength, Difficulty walking, Impaired flexibility  Visit Diagnosis: Muscle tone increased  Other muscle spasm  Muscle weakness (generalized)     Problem List Patient Active Problem List   Diagnosis Date Noted  . History of sciatica 09/06/2017  . Type 2 diabetes mellitus without complication, without long-term current use of insulin ([place] 09/06/2017  . History of gout 09/06/2017  . Diabetes mellitus type 2 in obese ([place] 05/13/2015  . Hypothyroid 07/25/2012  . Gout 05/02/2012  . Osteoarthritis 05/02/2012  . Hypertension 05/02/2012  .  Hyperlipidemia 05/02/2012  . Abnormal pap 05/02/2012  . Hyperglycemia 05/02/2012    Nitin Mckowen,ANGIE PTA 10/25/2017, 9:26 AM  [address] [address]Phone: [phone]  Fax:  [phone] Name: CXENIA NILEMRN: [id number] of Birth: 613-Feb-1947

## 2017-10-27 ENCOUNTER — Ambulatory Visit: Payer: Medicare Other | Admitting: Physical Therapy

## 2017-10-27 ENCOUNTER — Encounter: Payer: Self-pay | Admitting: Physical Therapy

## 2017-10-27 DIAGNOSIS — M545 Low back pain: Secondary | ICD-10-CM

## 2017-10-27 DIAGNOSIS — M6281 Muscle weakness (generalized): Secondary | ICD-10-CM

## 2017-10-27 DIAGNOSIS — M62838 Other muscle spasm: Secondary | ICD-10-CM

## 2017-10-27 DIAGNOSIS — G8929 Other chronic pain: Secondary | ICD-10-CM

## 2017-10-27 DIAGNOSIS — M6289 Other specified disorders of muscle: Secondary | ICD-10-CM | POA: Diagnosis not present

## 2017-10-27 NOTE — Therapy (Signed)
[place], [place], [address] Phone: [phone]   Fax:  [phone]  Physical Therapy Treatment  Patient Details  Name: Isabella Dixon MRN: [id number] Date of Birth: [birth year] Referring Provider: Durward Fortes   Encounter Date: 10/27/2017  PT End of Session - 10/27/17 1023    Visit Number  8    Date for PT Re-Evaluation  11/26/17    PT Start Time  0930    PT Stop Time  1030    PT Time Calculation (min)  60 min    Activity Tolerance  Patient tolerated treatment well    Behavior During Therapy  [facility] for tasks assessed/performed       Past Medical History:  Diagnosis Date  . Anemia   . Arthritis   . Diabetes mellitus without complication ([place])   . Gout   . High cholesterol   . Hypertension   . Osteoporosis     Past Surgical History:  Procedure Laterality Date  . ABDOMINAL HYSTERECTOMY    . TUBAL LIGATION      There were no vitals filed for this visit.  Subjective Assessment - 10/27/17 0934    Subjective  Pt reports some soreness from last treatment.    Currently in Pain?  Yes    Pain Score  4     Pain Location  Buttocks    Pain Orientation  Right                      [facility] Adult PT Treatment/Exercise - 10/27/17 0001      Lumbar Exercises: Stretches   Passive Hamstring Stretch  3 reps;20 seconds;Right;Left    Piriformis Stretch  3 reps;20 seconds      Lumbar Exercises: Aerobic   Nustep  level 5 x 7 minutes      Lumbar Exercises: Machines for Strengthening   Cybex Lumbar Extension  black band 15 ext postural cues    Leg Press  30# 2 sets 10    Other Lumbar Machine Exercise  row and lat 20# 2 sets 10      Lumbar Exercises: Standing   Heel Raises  15 reps cues not to rock    Other Standing Lumbar Exercises  wt ball OH ext and obl 15 each      Lumbar Exercises: Supine   Bridge  10 reps;2 seconds      Cryotherapy   Number Minutes Cryotherapy  15 Minutes     Cryotherapy Location  Lumbar Spine    Type of Cryotherapy  Ice pack      Electrical Stimulation   Electrical Stimulation Location  L/S area    Electrical Stimulation Action  IFC    Electrical Stimulation Parameters  supine    Electrical Stimulation Goals  Pain               PT Short Term Goals - 10/06/17 0953      PT SHORT TERM GOAL #1   Title  independent with initial HEP     Baseline  stretches    Status  Achieved        PT Long Term Goals - 10/19/17 0954      PT LONG TERM GOAL #1   Title  decrease pain 50% for OQL and functional use    Status  Partially Met      PT LONG TERM GOAL #2   Title  reports bowling regularly again with  no pain or limitation    Status  Partially Met      PT LONG TERM GOAL #3   Title  reports being able to do normal yardwork  and gardening without pain or limitation    Status  Partially Met            Plan - 10/27/17 1024    Clinical Impression Statement  increase soreness reported in both LE's. Postural cueing needed throughout treatment. Pt RLE HS and piriformis is tighter than L noted with passive stretching.    Rehab Potential  Good    PT Frequency  2x / week    PT Duration  8 weeks    PT Treatment/Interventions  Electrical Stimulation;Iontophoresis 8m/ml Dexamethasone;Moist Heat;Traction;Cryotherapy;Therapeutic exercise;Therapeutic activities;Patient/family education;Manual techniques;Dry needling    PT Next Visit Plan  BM/lifting and assess goals       Patient will benefit from skilled therapeutic intervention in order to improve the following deficits and impairments:  Pain, Increased muscle spasms, Impaired tone, Decreased strength, Difficulty walking, Impaired flexibility  Visit Diagnosis: Muscle tone increased  Other muscle spasm  Muscle weakness (generalized)  Chronic bilateral low back pain, with sciatica presence unspecified     Problem List Patient Active Problem List   Diagnosis Date Noted  .  History of sciatica 09/06/2017  . Type 2 diabetes mellitus without complication, without long-term current use of insulin ([facility] 09/06/2017  . History of gout 09/06/2017  . Diabetes mellitus type 2 in obese ([place] 05/13/2015  . Hypothyroid 07/25/2012  . Gout 05/02/2012  . Osteoarthritis 05/02/2012  . Hypertension 05/02/2012  . Hyperlipidemia 05/02/2012  . Abnormal pap 05/02/2012  . Hyperglycemia 05/02/2012    RScot Jun PTA 10/27/2017, 10:33 AM  [address] [address]Phone: [phone]  Fax:  [phone] Name: Isabella MONTROYMRN: [id number] of Birth: 6July 03, 1947

## 2017-11-03 ENCOUNTER — Ambulatory Visit: Payer: Medicare Other | Admitting: Physical Therapy

## 2017-11-03 DIAGNOSIS — M6289 Other specified disorders of muscle: Secondary | ICD-10-CM

## 2017-11-03 DIAGNOSIS — M6281 Muscle weakness (generalized): Secondary | ICD-10-CM

## 2017-11-03 DIAGNOSIS — M62838 Other muscle spasm: Secondary | ICD-10-CM

## 2017-11-03 NOTE — Therapy (Signed)
[place] [address] [place], [place], [address] Phone: [phone]   Fax:  [phone]  Physical Therapy Treatment  Patient Details  Name: Isabella Dixon MRN: [id number] Date of Birth: 1945/11/16 Referring Provider: Durward Fortes   Encounter Date: 11/03/2017  PT End of Session - 11/03/17 1734    Visit Number  9    Date for PT Re-Evaluation  11/26/17    PT Start Time  8264    PT Stop Time  1750    PT Time Calculation (min)  55 min       Past Medical History:  Diagnosis Date  . Anemia   . Arthritis   . Diabetes mellitus without complication ([place])   . Gout   . High cholesterol   . Hypertension   . Osteoporosis     Past Surgical History:  Procedure Laterality Date  . ABDOMINAL HYSTERECTOMY    . TUBAL LIGATION      There were no vitals filed for this visit.  Subjective Assessment - 11/03/17 1715    Subjective  significant increased pain since last session BIL    Currently in Pain?  Yes    Pain Score  9     Pain Location  Back                      [facility] Adult PT Treatment/Exercise - 11/03/17 0001      Lumbar Exercises: Aerobic   Nustep  level 5 x 7 minutes      Lumbar Exercises: Seated   Other Seated Lumbar Exercises  sit fit pelvic ROM    Other Seated Lumbar Exercises  lumb stab seated on sit fit      Lumbar Exercises: Supine   Other Supine Lumbar Exercises  lumb stab ex 15 min      Cryotherapy   Number Minutes Cryotherapy  15 Minutes    Cryotherapy Location  Lumbar Spine    Type of Cryotherapy  Ice pack      Electrical Stimulation   Electrical Stimulation Location  L/S area    Electrical Stimulation Action  IFC    Electrical Stimulation Parameters  supine    Electrical Stimulation Goals  Pain      Manual Therapy   Manual Therapy  Passive ROM    Passive ROM  LE and trunk               PT Short Term Goals - 10/06/17 0953      PT SHORT TERM GOAL #1   Title  independent with initial HEP     Baseline  stretches    Status  Achieved        PT Long Term Goals - 11/03/17 1736      PT LONG TERM GOAL #1   Title  decrease pain 50% for OQL and functional use    Status  Partially Met      PT LONG TERM GOAL #2   Title  reports bowling regularly again with no pain or limitation    Status  Partially Met      PT LONG TERM GOAL #3   Title  reports being able to do normal yardwork  and gardening without pain or limitation    Status  Partially Met            Plan - 11/03/17 1734    Clinical Impression Statement  no goals met this week d/t increased pain  since last session ( pt feels possibly from leg press?) pt tolerated PROM well and basic lumb stab ex. decreased pain at end of session    PT Next Visit Plan  assess and progress. add BM/lifting        Patient will benefit from skilled therapeutic intervention in order to improve the following deficits and impairments:  Pain, Increased muscle spasms, Impaired tone, Decreased strength, Difficulty walking, Impaired flexibility  Visit Diagnosis: Muscle tone increased  Other muscle spasm  Muscle weakness (generalized)     Problem List Patient Active Problem List   Diagnosis Date Noted  . History of sciatica 09/06/2017  . Type 2 diabetes mellitus without complication, without long-term current use of insulin ([place]) 09/06/2017  . History of gout 09/06/2017  . Diabetes mellitus type 2 in obese ([place]) 05/13/2015  . Hypothyroid 07/25/2012  . Gout 05/02/2012  . Osteoarthritis 05/02/2012  . Hypertension 05/02/2012  . Hyperlipidemia 05/02/2012  . Abnormal pap 05/02/2012  . Hyperglycemia 05/02/2012    PAYSEUR,ANGIE PTA 11/03/2017, 5:37 PM  [place] [address] [place], [place], [address] Phone: [phone]   Fax:  [phone]  Name: VIKTORYA ARGUIJO MRN: [id number] Date of Birth: [birth year]

## 2017-11-07 ENCOUNTER — Ambulatory Visit: Payer: Medicare Other | Admitting: Physical Therapy

## 2017-11-07 DIAGNOSIS — M6289 Other specified disorders of muscle: Secondary | ICD-10-CM

## 2017-11-07 DIAGNOSIS — M6281 Muscle weakness (generalized): Secondary | ICD-10-CM

## 2017-11-07 DIAGNOSIS — M62838 Other muscle spasm: Secondary | ICD-10-CM

## 2017-11-07 NOTE — Therapy (Signed)
[place], [place], [address] Phone: [phone]   Fax:  [phone]  Physical Therapy Treatment  Patient Details  Name: ARCENIA SCARBRO MRN: [id number] Date of Birth: [birth year] Referring Provider: Durward Fortes   Encounter Date: 11/07/2017  PT End of Session - 11/07/17 1011    Visit Number  10    Date for PT Re-Evaluation  11/26/17    PT Start Time  0930    PT Stop Time  1026    PT Time Calculation (min)  56 min    Activity Tolerance  Patient tolerated treatment well    Behavior During Therapy  [facility] for tasks assessed/performed       Past Medical History:  Diagnosis Date  . Anemia   . Arthritis   . Diabetes mellitus without complication ([place])   . Gout   . High cholesterol   . Hypertension   . Osteoporosis     Past Surgical History:  Procedure Laterality Date  . ABDOMINAL HYSTERECTOMY    . TUBAL LIGATION      There were no vitals filed for this visit.  Subjective Assessment - 11/07/17 0932    Subjective  Pt reports pain in R foot and buttocks    Currently in Pain?  Yes    Pain Score  6                       [facility] Adult PT Treatment/Exercise - 11/07/17 0001      Lumbar Exercises: Stretches   Passive Hamstring Stretch  20 seconds;Right;Left;4 reps    Single Knee to Chest Stretch  Left;Right;3 reps;10 seconds    Piriformis Stretch  20 seconds;4 reps      Lumbar Exercises: Aerobic   Nustep  level 4 x 7 minutes      Lumbar Exercises: Seated   Long Arc Quad on Chair  Both;2 sets;10 reps;Weights on sit fit    LAQ on Chair Weights (lbs)  3     Other Seated Lumbar Exercises  on sit fir blue tband rows  2x10; HS curle green tband 2x10       Lumbar Exercises: Supine   Bridge  10 reps;2 seconds x2      Cryotherapy   Number Minutes Cryotherapy  15 Minutes    Cryotherapy Location  Lumbar Spine    Type of Cryotherapy  Ice pack      Electrical Stimulation   Electrical Stimulation Location  L/S area    Electrical Stimulation Action  IFC    Electrical Stimulation Parameters  supine    Electrical Stimulation Goals  Pain               PT Short Term Goals - 10/06/17 0953      PT SHORT TERM GOAL #1   Title  independent with initial HEP     Baseline  stretches    Status  Achieved        PT Long Term Goals - 11/03/17 1736      PT LONG TERM GOAL #1   Title  decrease pain 50% for OQL and functional use    Status  Partially Met      PT LONG TERM GOAL #2   Title  reports bowling regularly again with no pain or limitation    Status  Partially Met      PT LONG TERM GOAL #3   Title  reports being able  to do normal yardwork  and gardening without pain or limitation    Status  Partially Met            Plan - 11/07/17 1012    Clinical Impression Statement  Pt reports that she feel a little better compared to last treatment. Added more stretching and less intense postural and LE strength ing interventions.     Rehab Potential  Good    PT Frequency  2x / week    PT Duration  8 weeks    PT Treatment/Interventions  Electrical Stimulation;Iontophoresis 63m/ml Dexamethasone;Moist Heat;Traction;Cryotherapy;Therapeutic exercise;Therapeutic activities;Patient/family education;Manual techniques;Dry needling    PT Next Visit Plan  assess and progress. add BM/lifting        Patient will benefit from skilled therapeutic intervention in order to improve the following deficits and impairments:  Pain, Increased muscle spasms, Impaired tone, Decreased strength, Difficulty walking, Impaired flexibility  Visit Diagnosis: Muscle tone increased  Other muscle spasm  Muscle weakness (generalized)     Problem List Patient Active Problem List   Diagnosis Date Noted  . History of sciatica 09/06/2017  . Type 2 diabetes mellitus without complication, without long-term current use of insulin ([place] 09/06/2017  . History of gout 09/06/2017  .  Diabetes mellitus type 2 in obese ([place] 05/13/2015  . Hypothyroid 07/25/2012  . Gout 05/02/2012  . Osteoarthritis 05/02/2012  . Hypertension 05/02/2012  . Hyperlipidemia 05/02/2012  . Abnormal pap 05/02/2012  . Hyperglycemia 05/02/2012    RScot Jun PTA 11/07/2017, 10:14 AM  [address] [address]Phone: [phone]  Fax:  [phone] Name: CIMOGINE CARVELLMRN: [id number] of Birth: [birth year]

## 2017-11-10 ENCOUNTER — Encounter: Payer: Self-pay | Admitting: Physical Therapy

## 2017-11-10 ENCOUNTER — Ambulatory Visit: Payer: Medicare Other | Admitting: Physical Therapy

## 2017-11-10 DIAGNOSIS — M6289 Other specified disorders of muscle: Secondary | ICD-10-CM | POA: Diagnosis not present

## 2017-11-10 DIAGNOSIS — M545 Low back pain: Secondary | ICD-10-CM

## 2017-11-10 DIAGNOSIS — M62838 Other muscle spasm: Secondary | ICD-10-CM

## 2017-11-10 DIAGNOSIS — M6281 Muscle weakness (generalized): Secondary | ICD-10-CM

## 2017-11-10 DIAGNOSIS — G8929 Other chronic pain: Secondary | ICD-10-CM

## 2017-11-10 NOTE — Therapy (Signed)
[facility] [address] [place], [place], [address] Phone: [phone]   Fax:  [phone]  Physical Therapy Treatment  Patient Details  Name: Isabella Dixon MRN: [id number] Date of Birth: 08/12/1946 Referring Provider: [place]   Encounter Date: 11/10/2017  PT End of Session - 11/10/17 1344    Visit Number  11    Date for PT Re-Evaluation  11/26/17    PT Start Time  1300    PT Stop Time  1345    PT Time Calculation (min)  45 min    Activity Tolerance  Patient tolerated treatment well    Behavior During Therapy  [facility] for tasks assessed/performed       Past Medical History:  Diagnosis Date  . Anemia   . Arthritis   . Diabetes mellitus without complication ([facility])   . Gout   . High cholesterol   . Hypertension   . Osteoporosis     Past Surgical History:  Procedure Laterality Date  . ABDOMINAL HYSTERECTOMY    . TUBAL LIGATION      There were no vitals filed for this visit.  Subjective Assessment - 11/10/17 1302    Subjective  Pt reports that she doing a lot better    Currently in Pain?  Yes    Pain Score  4     Pain Location  Buttocks    Pain Orientation  Left;Right                      [facility] Adult PT Treatment/Exercise - 11/10/17 0001      Lumbar Exercises: Stretches   Passive Hamstring Stretch  20 seconds;Right;Left;4 reps    Single Knee to Chest Stretch  Left;Right;3 reps;10 seconds    Piriformis Stretch  20 seconds;4 reps      Lumbar Exercises: Aerobic   Nustep  level 4 x 7 minutes      Lumbar Exercises: Machines for Strengthening   Other Lumbar Machine Exercise  row and lat 20# 2 sets 10      Lumbar Exercises: Seated   Long Arc Quad on Chair  Both;2 sets;10 reps;Weights;Other (comment) on sit fit    LAQ on Chair Weights (lbs)  3     Other Seated Lumbar Exercises  on sit fir blue tband rows  2x10; HS curle green tband 2x10     Other Seated Lumbar Exercises  lumb stab  seated on sit fit      Lumbar Exercises: Supine   Bridge  10 reps;2 seconds    Bridge with Ball Squeeze  Compliant;2 seconds;10 reps x2      Cryotherapy   Number Minutes Cryotherapy  15 Minutes    Cryotherapy Location  Lumbar Spine    Type of Cryotherapy  Ice pack      Electrical Stimulation   Electrical Stimulation Location  L/S area    Electrical Stimulation Action  IFC    Electrical Stimulation Parameters  supine    Electrical Stimulation Goals  Pain      Manual Therapy   Manual Therapy  Passive ROM               PT Short Term Goals - 10/06/17 0953      PT SHORT TERM GOAL #1   Title  independent with initial HEP     Baseline  stretches    Status  Achieved        PT Long Term Goals -   11/03/17 1736      PT LONG TERM GOAL #1   Title  decrease pain 50% for OQL and functional use    Status  Partially Met      PT LONG TERM GOAL #2   Title  reports bowling regularly again with no pain or limitation    Status  Partially Met      PT LONG TERM GOAL #3   Title  reports being able to do normal yardwork  and gardening without pain or limitation    Status  Partially Met            Plan - 11/10/17 1345    Clinical Impression Statement  Pt reports less pain overall, added more postural strengthening interventions, She reports soreness I both buttocks R>L. Tight HS and piriformis in RLE    Rehab Potential  Good    PT Frequency  2x / week    PT Duration  8 weeks    PT Treatment/Interventions  Electrical Stimulation;Iontophoresis 4mg/ml Dexamethasone;Moist Heat;Traction;Cryotherapy;Therapeutic exercise;Therapeutic activities;Patient/family education;Manual techniques;Dry needling    PT Next Visit Plan  assess and progress. add BM/lifting        Patient will benefit from skilled therapeutic intervention in order to improve the following deficits and impairments:  Pain, Increased muscle spasms, Impaired tone, Decreased strength, Difficulty walking, Impaired  flexibility  Visit Diagnosis: Other muscle spasm  Muscle tone increased  Chronic bilateral low back pain, with sciatica presence unspecified  Muscle weakness (generalized)     Problem List Patient Active Problem List   Diagnosis Date Noted  . History of sciatica 09/06/2017  . Type 2 diabetes mellitus without complication, without long-term current use of insulin ([facility]) 09/06/2017  . History of gout 09/06/2017  . Diabetes mellitus type 2 in obese ([facility]) 05/13/2015  . Hypothyroid 07/25/2012  . Gout 05/02/2012  . Osteoarthritis 05/02/2012  . Hypertension 05/02/2012  . Hyperlipidemia 05/02/2012  . Abnormal pap 05/02/2012  . Hyperglycemia 05/02/2012    Ronald G Pemberton, PTA 11/10/2017, 1:50 PM  [facility] [address] [place], [place], [address] Phone: [phone]   Fax:  [phone]  Name: Isabella Dixon MRN: [id number] Date of Birth: 03/30/1946   

## 2017-11-16 ENCOUNTER — Encounter: Payer: Self-pay | Admitting: Physical Therapy

## 2017-11-16 ENCOUNTER — Ambulatory Visit: Payer: Medicare Other | Admitting: Physical Therapy

## 2017-11-16 DIAGNOSIS — M62838 Other muscle spasm: Secondary | ICD-10-CM

## 2017-11-16 DIAGNOSIS — M6289 Other specified disorders of muscle: Secondary | ICD-10-CM

## 2017-11-16 DIAGNOSIS — M545 Low back pain: Secondary | ICD-10-CM

## 2017-11-16 DIAGNOSIS — M6281 Muscle weakness (generalized): Secondary | ICD-10-CM

## 2017-11-16 DIAGNOSIS — G8929 Other chronic pain: Secondary | ICD-10-CM

## 2017-11-16 NOTE — Therapy (Signed)
[place], [place], [address] Phone: [phone]   Fax:  [phone]  Physical Therapy Treatment  Patient Details  Name: Isabella Dixon MRN: [id number] Date of Birth: 1945/09/03 Referring Provider: Durward Fortes   Encounter Date: 11/16/2017  PT End of Session - 11/16/17 0924    Visit Number  12    Date for PT Re-Evaluation  11/26/17    PT Start Time  0840    PT Stop Time  0939    PT Time Calculation (min)  59 min    Activity Tolerance  Patient tolerated treatment well    Behavior During Therapy  [facility] for tasks assessed/performed       Past Medical History:  Diagnosis Date  . Anemia   . Arthritis   . Diabetes mellitus without complication ([place])   . Gout   . High cholesterol   . Hypertension   . Osteoporosis     Past Surgical History:  Procedure Laterality Date  . ABDOMINAL HYSTERECTOMY    . TUBAL LIGATION      There were no vitals filed for this visit.  Subjective Assessment - 11/16/17 0843    Subjective  Patient reports that she had three days where she was going to different places and reports all of those places had hard seating, bleachers at two places, reports some increased pain, stiffness and soreness    Currently in Pain?  Yes    Pain Score  5     Pain Location  Buttocks    Pain Orientation  Right    Aggravating Factors   sitting on hard surfaces                No data recorded       [facility] Adult PT Treatment/Exercise - 11/16/17 0001      Lumbar Exercises: Stretches   Passive Hamstring Stretch  20 seconds;Right;Left;4 reps    Single Knee to Chest Stretch  Left;Right;3 reps;10 seconds    Quad Stretch  3 reps;10 seconds;Right;Left    ITB Stretch  3 reps;10 seconds    Piriformis Stretch  20 seconds;4 reps      Lumbar Exercises: Aerobic   Nustep  level 4 x 7 minutes      Lumbar Exercises: Machines for Strengthening   Other Lumbar Machine Exercise   row and lat 20# 2 sets 10      Lumbar Exercises: Supine   Bridge with Ball Squeeze  Compliant;2 seconds;10 reps    Bridge with clamshell  20 reps;2 seconds    Other Supine Lumbar Exercises  lumb stab ex 15 min    Other Supine Lumbar Exercises  feet on ball K2C, trunk rotation, small bridges, isometric abdominals      Cryotherapy   Number Minutes Cryotherapy  15 Minutes    Cryotherapy Location  Lumbar Spine    Type of Cryotherapy  Ice pack      Electrical Stimulation   Electrical Stimulation Location  L/S area    Electrical Stimulation Action  IFC    Electrical Stimulation Parameters  supine    Electrical Stimulation Goals  Pain               PT Short Term Goals - 10/06/17 0953      PT SHORT TERM GOAL #1   Title  independent with initial HEP     Baseline  stretches    Status  Achieved  PT Long Term Goals - 11/16/17 0925      PT LONG TERM GOAL #2   Title  reports bowling regularly again with no pain or limitation    Status  Partially Met      PT LONG TERM GOAL #3   Title  reports being able to do normal yardwork  and gardening without pain or limitation    Status  Partially Met            Plan - 11/16/17 0924    Clinical Impression Statement  Had some increased buttock and low back pain after three days of sitting on hard surfaces with bleachers.  Reports increased tightness, feels like the stretches help her most    PT Next Visit Plan  assess and progress. add BM/lifting     Consulted and Agree with Plan of Care  Patient       Patient will benefit from skilled therapeutic intervention in order to improve the following deficits and impairments:  Pain, Increased muscle spasms, Impaired tone, Decreased strength, Difficulty walking, Impaired flexibility  Visit Diagnosis: Other muscle spasm  Muscle tone increased  Chronic bilateral low back pain, with sciatica presence unspecified  Muscle weakness (generalized)     Problem List Patient  Active Problem List   Diagnosis Date Noted  . History of sciatica 09/06/2017  . Type 2 diabetes mellitus without complication, without long-term current use of insulin ([place]) 09/06/2017  . History of gout 09/06/2017  . Diabetes mellitus type 2 in obese ([place]) 05/13/2015  . Hypothyroid 07/25/2012  . Gout 05/02/2012  . Osteoarthritis 05/02/2012  . Hypertension 05/02/2012  . Hyperlipidemia 05/02/2012  . Abnormal pap 05/02/2012  . Hyperglycemia 05/02/2012    Sumner Boast., PT 11/16/2017, 9:26 AM  [facility] [address] [place], [place], [address] Phone: [phone]   Fax:  [phone]  Name: Isabella Dixon MRN: [id number] Date of Birth: [birth year]

## 2017-11-18 ENCOUNTER — Ambulatory Visit: Payer: Medicare Other | Admitting: Physical Therapy

## 2017-11-18 ENCOUNTER — Encounter: Payer: Self-pay | Admitting: Physical Therapy

## 2017-11-18 DIAGNOSIS — M6289 Other specified disorders of muscle: Secondary | ICD-10-CM | POA: Diagnosis not present

## 2017-11-18 DIAGNOSIS — G8929 Other chronic pain: Secondary | ICD-10-CM

## 2017-11-18 DIAGNOSIS — M6281 Muscle weakness (generalized): Secondary | ICD-10-CM

## 2017-11-18 DIAGNOSIS — M62838 Other muscle spasm: Secondary | ICD-10-CM

## 2017-11-18 DIAGNOSIS — M545 Low back pain: Secondary | ICD-10-CM

## 2017-11-18 NOTE — Therapy (Signed)
[place], [place], [address] Phone: [phone]   Fax:  [phone]  Physical Therapy Treatment  Patient Details  Name: Isabella Dixon MRN: [id number] Date of Birth: [birth year] Referring Provider: Durward Fortes   Encounter Date: 11/18/2017  PT End of Session - 11/18/17 0922    Visit Number  13    Date for PT Re-Evaluation  11/26/17    PT Start Time  0841    PT Stop Time  0939    PT Time Calculation (min)  58 min    Activity Tolerance  Patient tolerated treatment well    Behavior During Therapy  [facility] for tasks assessed/performed       Past Medical History:  Diagnosis Date  . Anemia   . Arthritis   . Diabetes mellitus without complication ([place])   . Gout   . High cholesterol   . Hypertension   . Osteoporosis     Past Surgical History:  Procedure Laterality Date  . ABDOMINAL HYSTERECTOMY    . TUBAL LIGATION      There were no vitals filed for this visit.  Subjective Assessment - 11/18/17 0841    Subjective  Patient reports that she is a little sore, reports that she did some mowing with a puch mower.      Currently in Pain?  Yes    Pain Score  5     Pain Location  Buttocks    Pain Orientation  Right    Pain Descriptors / Indicators  Aching;Burning                No data recorded       [facility] Adult PT Treatment/Exercise - 11/18/17 0001      Lumbar Exercises: Stretches   Passive Hamstring Stretch  20 seconds;Right;Left;4 reps    Single Knee to Chest Stretch  Left;Right;3 reps;10 seconds    Quad Stretch  3 reps;10 seconds;Right;Left    ITB Stretch  3 reps;10 seconds    Piriformis Stretch  20 seconds;4 reps      Lumbar Exercises: Aerobic   Nustep  level 4 x 7 minutes      Lumbar Exercises: Machines for Strengthening   Other Lumbar Machine Exercise  row and lat 20# 2 sets 10      Lumbar Exercises: Supine   Bridge with Ball Squeeze  Compliant;2 seconds;10  reps    Bridge with clamshell  20 reps;2 seconds    Other Supine Lumbar Exercises  lumb stab ex 15 min    Other Supine Lumbar Exercises  feet on ball K2C, trunk rotation, small bridges, isometric abdominals      Cryotherapy   Number Minutes Cryotherapy  15 Minutes    Cryotherapy Location  Lumbar Spine    Type of Cryotherapy  Ice pack      Electrical Stimulation   Electrical Stimulation Location  L/S area    Electrical Stimulation Action  IFC    Electrical Stimulation Parameters  supine    Electrical Stimulation Goals  Pain               PT Short Term Goals - 10/06/17 0953      PT SHORT TERM GOAL #1   Title  independent with initial HEP     Baseline  stretches    Status  Achieved        PT Long Term Goals - 11/18/17 0923      PT LONG  TERM GOAL #1   Title  decrease pain 50% for OQL and functional use    Status  Partially Met            Plan - 11/18/17 0923    Clinical Impression Statement  Patient with a difficult time contracting abdominal mms, needs verbal and tactile cues to help achieve this    PT Next Visit Plan  assess and progress. add BM/lifting     Consulted and Agree with Plan of Care  Patient       Patient will benefit from skilled therapeutic intervention in order to improve the following deficits and impairments:  Pain, Increased muscle spasms, Impaired tone, Decreased strength, Difficulty walking, Impaired flexibility  Visit Diagnosis: Other muscle spasm  Muscle tone increased  Chronic bilateral low back pain, with sciatica presence unspecified  Muscle weakness (generalized)     Problem List Patient Active Problem List   Diagnosis Date Noted  . History of sciatica 09/06/2017  . Type 2 diabetes mellitus without complication, without long-term current use of insulin ([place]) 09/06/2017  . History of gout 09/06/2017  . Diabetes mellitus type 2 in obese ([place]) 05/13/2015  . Hypothyroid 07/25/2012  . Gout 05/02/2012  . Osteoarthritis  05/02/2012  . Hypertension 05/02/2012  . Hyperlipidemia 05/02/2012  . Abnormal pap 05/02/2012  . Hyperglycemia 05/02/2012    Sumner Boast., PT 11/18/2017, 9:25 AM  [place] [address] [place], [place], [address] Phone: [phone]   Fax:  [phone]  Name: Isabella Dixon MRN: [id number] Date of Birth: [birth year]

## 2017-11-23 ENCOUNTER — Ambulatory Visit: Payer: Medicare Other | Attending: Orthopaedic Surgery | Admitting: Physical Therapy

## 2017-11-23 ENCOUNTER — Encounter: Payer: Self-pay | Admitting: Physical Therapy

## 2017-11-23 DIAGNOSIS — M545 Low back pain: Secondary | ICD-10-CM | POA: Insufficient documentation

## 2017-11-23 DIAGNOSIS — M6289 Other specified disorders of muscle: Secondary | ICD-10-CM | POA: Diagnosis present

## 2017-11-23 DIAGNOSIS — M62838 Other muscle spasm: Secondary | ICD-10-CM | POA: Diagnosis not present

## 2017-11-23 DIAGNOSIS — M6281 Muscle weakness (generalized): Secondary | ICD-10-CM

## 2017-11-23 DIAGNOSIS — G8929 Other chronic pain: Secondary | ICD-10-CM | POA: Diagnosis present

## 2017-11-23 NOTE — Therapy (Signed)
[place], [place], [address] Phone: [phone]   Fax:  [phone]  Physical Therapy Treatment  Patient Details  Name: Isabella Dixon MRN: [id number] Date of Birth: Jul 20, 1946 Referring Provider: Durward Fortes   Encounter Date: 11/23/2017  PT End of Session - 11/23/17 0955    Visit Number  14    Date for PT Re-Evaluation  11/26/17    PT Start Time  0928    PT Stop Time  1025    PT Time Calculation (min)  57 min    Activity Tolerance  Patient tolerated treatment well    Behavior During Therapy  [facility] for tasks assessed/performed       Past Medical History:  Diagnosis Date  . Anemia   . Arthritis   . Diabetes mellitus without complication ([place])   . Gout   . High cholesterol   . Hypertension   . Osteoporosis     Past Surgical History:  Procedure Laterality Date  . ABDOMINAL HYSTERECTOMY    . TUBAL LIGATION      There were no vitals filed for this visit.  Subjective Assessment - 11/23/17 0930    Subjective  Patient doing well with her current treatment, reports feels like things are getting better, still hurting, "feels stronger"    Currently in Pain?  Yes    Pain Score  3     Pain Location  Buttocks    Pain Orientation  Right    Pain Descriptors / Indicators  Aching                       [facility] Adult PT Treatment/Exercise - 11/23/17 0001      High Level Balance   High Level Balance Comments  resisted gait fwd and side stepping      Lumbar Exercises: Stretches   Passive Hamstring Stretch  20 seconds;Right;Left;4 reps    Quad Stretch  3 reps;10 seconds;Right;Left    ITB Stretch  3 reps;10 seconds    Piriformis Stretch  20 seconds;4 reps    Gastroc Stretch  20 seconds;3 reps      Lumbar Exercises: Aerobic   Nustep  level 4 x 7 minutes      Lumbar Exercises: Machines for Strengthening   Other Lumbar Machine Exercise  row and lat 20# 2 sets 10      Lumbar  Exercises: Standing   Other Standing Lumbar Exercises  hip extension and abduction 3# 2x10      Lumbar Exercises: Supine   Bridge with Ball Squeeze  Compliant;2 seconds;10 reps    Bridge with clamshell  20 reps;2 seconds    Other Supine Lumbar Exercises  feet on ball K2C, trunk rotation, small bridges, isometric abdominals      Cryotherapy   Number Minutes Cryotherapy  15 Minutes    Cryotherapy Location  Lumbar Spine    Type of Cryotherapy  Ice pack      Electrical Stimulation   Electrical Stimulation Location  L/S area    Electrical Stimulation Action  IFC    Electrical Stimulation Parameters  supine    Electrical Stimulation Goals  Pain               PT Short Term Goals - 10/06/17 0953      PT SHORT TERM GOAL #1   Title  independent with initial HEP     Baseline  stretches    Status  Achieved        PT Long Term Goals - 11/23/17 1008      PT LONG TERM GOAL #1   Title  decrease pain 50% for OQL and functional use    Status  Partially Met      PT LONG TERM GOAL #3   Title  reports being able to do normal yardwork  and gardening without pain or limitation    Status  Partially Met            Plan - 11/23/17 1007    Clinical Impression Statement  Added calf stretch, hip abduction and extension and some resisted gait.  She had some c/o tightness in the calves, reported that the hip abduction was difficult    PT Next Visit Plan  assess and progress. add BM/lifting     Consulted and Agree with Plan of Care  Patient       Patient will benefit from skilled therapeutic intervention in order to improve the following deficits and impairments:  Pain, Increased muscle spasms, Impaired tone, Decreased strength, Difficulty walking, Impaired flexibility  Visit Diagnosis: Other muscle spasm  Muscle tone increased  Chronic bilateral low back pain, with sciatica presence unspecified  Muscle weakness (generalized)     Problem List Patient Active Problem List    Diagnosis Date Noted  . History of sciatica 09/06/2017  . Type 2 diabetes mellitus without complication, without long-term current use of insulin ([place]) 09/06/2017  . History of gout 09/06/2017  . Diabetes mellitus type 2 in obese ([place]) 05/13/2015  . Hypothyroid 07/25/2012  . Gout 05/02/2012  . Osteoarthritis 05/02/2012  . Hypertension 05/02/2012  . Hyperlipidemia 05/02/2012  . Abnormal pap 05/02/2012  . Hyperglycemia 05/02/2012    Sumner Boast., PT 11/23/2017, 10:09 AM  [place] [address] [place], [place], [address] Phone: [phone]   Fax:  [phone]  Name: Isabella Dixon MRN: [id number] Date of Birth: 1945-12-05

## 2017-11-25 ENCOUNTER — Ambulatory Visit: Payer: Medicare Other | Admitting: Physical Therapy

## 2017-11-25 ENCOUNTER — Encounter: Payer: Self-pay | Admitting: Physical Therapy

## 2017-11-25 DIAGNOSIS — M6281 Muscle weakness (generalized): Secondary | ICD-10-CM

## 2017-11-25 DIAGNOSIS — M62838 Other muscle spasm: Secondary | ICD-10-CM

## 2017-11-25 DIAGNOSIS — G8929 Other chronic pain: Secondary | ICD-10-CM

## 2017-11-25 DIAGNOSIS — M6289 Other specified disorders of muscle: Secondary | ICD-10-CM

## 2017-11-25 DIAGNOSIS — M545 Low back pain: Secondary | ICD-10-CM

## 2017-11-25 NOTE — Therapy (Signed)
[place], [place], [address] Phone: [phone]   Fax:  [phone]  Physical Therapy Treatment  Patient Details  Name: Isabella Dixon MRN: [id number] Date of Birth: 09-May-1946 Referring Provider: Durward Fortes   Encounter Date: 11/25/2017  PT End of Session - 11/25/17 0951    Visit Number  15    Date for PT Re-Evaluation  11/26/17    PT Start Time  0920    PT Stop Time  1015    PT Time Calculation (min)  55 min       Past Medical History:  Diagnosis Date  . Anemia   . Arthritis   . Diabetes mellitus without complication ([place])   . Gout   . High cholesterol   . Hypertension   . Osteoporosis     Past Surgical History:  Procedure Laterality Date  . ABDOMINAL HYSTERECTOMY    . TUBAL LIGATION      There were no vitals filed for this visit.  Subjective Assessment - 11/25/17 0919    Subjective  getting better but some pins and needs in both lateral hips    Currently in Pain?  Yes    Pain Score  4     Pain Location  Hip    Pain Orientation  Right;Left                       [facility] Adult PT Treatment/Exercise - 11/25/17 0001      Lumbar Exercises: Aerobic   Nustep  L 5 7 min      Lumbar Exercises: Machines for Strengthening   Cybex Lumbar Extension  black band 15 ext trunk flex 15 times- cuing to activate core with pulls    Leg Press  30# 2 sets 10    Other Lumbar Machine Exercise  row and lat 20# 2 sets 10      Lumbar Exercises: Standing   Other Standing Lumbar Exercises  hip extension and abduction  red tband 2 sets 10    Other Standing Lumbar Exercises  6 inch step up fwd opp leg ext 10 each      Lumbar Exercises: Supine   Other Supine Lumbar Exercises  feet on ball K2C, trunk rotation, small bridges, isometric abdominals      Cryotherapy   Number Minutes Cryotherapy  15 Minutes    Cryotherapy Location  Lumbar Spine    Type of Cryotherapy  Ice pack      Electrical Stimulation   Electrical Stimulation Location  L/S area    Electrical Stimulation Action  IFC    Electrical Stimulation Parameters  supine    Electrical Stimulation Goals  Pain               PT Short Term Goals - 10/06/17 0953      PT SHORT TERM GOAL #1   Title  independent with initial HEP     Baseline  stretches    Status  Achieved        PT Long Term Goals - 11/23/17 1008      PT LONG TERM GOAL #1   Title  decrease pain 50% for OQL and functional use    Status  Partially Met      PT LONG TERM GOAL #3   Title  reports being able to do normal yardwork  and gardening without pain or limitation    Status  Partially Met  Plan - 11/25/17 0952    Clinical Impression Statement  pt needed cuing for core activation, posture and control of mvmt esp with newly added ex. pt reports pins and needles at arrival but nothing after ex. progressing with goals    PT Treatment/Interventions  Electrical Stimulation;Iontophoresis 16m/ml Dexamethasone;Moist Heat;Traction;Cryotherapy;Therapeutic exercise;Therapeutic activities;Patient/family education;Manual techniques;Dry needling    PT Next Visit Plan  BM and lifting.core stab       Patient will benefit from skilled therapeutic intervention in order to improve the following deficits and impairments:  Pain, Increased muscle spasms, Impaired tone, Decreased strength, Difficulty walking, Impaired flexibility  Visit Diagnosis: Other muscle spasm  Muscle tone increased  Chronic bilateral low back pain, with sciatica presence unspecified  Muscle weakness (generalized)     Problem List Patient Active Problem List   Diagnosis Date Noted  . History of sciatica 09/06/2017  . Type 2 diabetes mellitus without complication, without long-term current use of insulin ([place] 09/06/2017  . History of gout 09/06/2017  . Diabetes mellitus type 2 in obese ([place] 05/13/2015  . Hypothyroid 07/25/2012  . Gout 05/02/2012   . Osteoarthritis 05/02/2012  . Hypertension 05/02/2012  . Hyperlipidemia 05/02/2012  . Abnormal pap 05/02/2012  . Hyperglycemia 05/02/2012    Caine Barfield,ANGIE PTA 11/25/2017, 9:58 AM  [address] [address]Phone: [phone]  Fax:  [phone] Name: Isabella HAMRICMRN: [id number] of Birth: [birth year]

## 2017-12-01 ENCOUNTER — Ambulatory Visit: Payer: Medicare Other | Admitting: Physical Therapy

## 2017-12-01 DIAGNOSIS — G8929 Other chronic pain: Secondary | ICD-10-CM

## 2017-12-01 DIAGNOSIS — M62838 Other muscle spasm: Secondary | ICD-10-CM

## 2017-12-01 DIAGNOSIS — M545 Low back pain: Secondary | ICD-10-CM

## 2017-12-01 DIAGNOSIS — M6289 Other specified disorders of muscle: Secondary | ICD-10-CM

## 2017-12-01 DIAGNOSIS — M6281 Muscle weakness (generalized): Secondary | ICD-10-CM

## 2017-12-01 NOTE — Therapy (Signed)
[place], [place], [address] Phone: [phone]   Fax:  [phone]  Physical Therapy Treatment  Patient Details  Name: Isabella Dixon MRN: [id number] Date of Birth: 1946/01/17 Referring Provider: Durward Fortes   Encounter Date: 12/01/2017  PT End of Session - 12/01/17 1013    Visit Number  16    Date for PT Re-Evaluation  12/26/17    PT Start Time  3893    PT Stop Time  1110    PT Time Calculation (min)  55 min       Past Medical History:  Diagnosis Date  . Anemia   . Arthritis   . Diabetes mellitus without complication ([place])   . Gout   . High cholesterol   . Hypertension   . Osteoporosis     Past Surgical History:  Procedure Laterality Date  . ABDOMINAL HYSTERECTOMY    . TUBAL LIGATION      There were no vitals filed for this visit.  Subjective Assessment - 12/01/17 1011    Subjective  50% plus better. just sore no pain    Currently in Pain?  No/denies                       [facility] Adult PT Treatment/Exercise - 12/01/17 0001      Lumbar Exercises: Aerobic   Nustep  L 5 7 min      Lumbar Exercises: Machines for Strengthening   Cybex Lumbar Extension  black band 15 ext 2 sets    Leg Press  30# 2 sets 15    Other Lumbar Machine Exercise  row and lat 20# 2 sets 15      Lumbar Exercises: Supine   Bridge with Ball Squeeze  Compliant;2 seconds;10 reps    Bridge with clamshell  20 reps;2 seconds    Other Supine Lumbar Exercises  lumb stab 10 min add'l    Other Supine Lumbar Exercises  feet on ball K2C, trunk rotation, small bridges, isometric abdominals      Cryotherapy   Number Minutes Cryotherapy  15 Minutes    Cryotherapy Location  Lumbar Spine    Type of Cryotherapy  Ice pack      Electrical Stimulation   Electrical Stimulation Location  L/S area    Electrical Stimulation Action  IFC    Electrical Stimulation Parameters  supine    Electrical  Stimulation Goals  Pain      Manual Therapy   Manual Therapy  Other (comment) educ pt in self stretch LE and trunk             PT Education - 12/01/17 1040    Education provided  Yes    Education Details  reviewed stretching for home and explained importance    Person(s) Educated  Patient    Methods  Explanation;Demonstration    Comprehension  Verbalized understanding;Returned demonstration       PT Short Term Goals - 10/06/17 0953      PT SHORT TERM GOAL #1   Title  independent with initial HEP     Baseline  stretches    Status  Achieved        PT Long Term Goals - 12/01/17 1013      PT LONG TERM GOAL #1   Title  decrease pain 50% for OQL and functional use    Status  Achieved      PT LONG TERM  GOAL #2   Title  reports bowling regularly again with no pain or limitation    Status  Achieved      PT LONG TERM GOAL #3   Title  reports being able to do normal yardwork  and gardening without pain or limitation    Status  Partially Met            Plan - 12/01/17 1042    Clinical Impression Statement  continue to work on core strengthening and cuing to activate core with mvmt. reviewed and stressed need for stretching at home. progressing with goals    PT Treatment/Interventions  Electrical Stimulation;Iontophoresis 67m/ml Dexamethasone;Moist Heat;Traction;Cryotherapy;Therapeutic exercise;Therapeutic activities;Patient/family education;Manual techniques;Dry needling    PT Next Visit Plan  core ex and prepare for D/C       Patient will benefit from skilled therapeutic intervention in order to improve the following deficits and impairments:  Pain, Increased muscle spasms, Impaired tone, Decreased strength, Difficulty walking, Impaired flexibility  Visit Diagnosis: Other muscle spasm  Muscle tone increased  Chronic bilateral low back pain, with sciatica presence unspecified  Muscle weakness (generalized)     Problem List Patient Active Problem List    Diagnosis Date Noted  . History of sciatica 09/06/2017  . Type 2 diabetes mellitus without complication, without long-term current use of insulin ([place] 09/06/2017  . History of gout 09/06/2017  . Diabetes mellitus type 2 in obese ([place] 05/13/2015  . Hypothyroid 07/25/2012  . Gout 05/02/2012  . Osteoarthritis 05/02/2012  . Hypertension 05/02/2012  . Hyperlipidemia 05/02/2012  . Abnormal pap 05/02/2012  . Hyperglycemia 05/02/2012    Isabella Dixon,Isabella Dixon 12/01/2017, 10:45 AM  [address] [address]Phone: [phone]  Fax:  [phone] Name: Isabella MIDDLEBROOKSMRN: [id number] of Birth: 607/15/1947

## 2017-12-02 ENCOUNTER — Ambulatory Visit: Payer: Medicare Other | Admitting: Physical Therapy

## 2017-12-02 ENCOUNTER — Encounter: Payer: Self-pay | Admitting: Physical Therapy

## 2017-12-02 DIAGNOSIS — M545 Low back pain: Principal | ICD-10-CM

## 2017-12-02 DIAGNOSIS — M6281 Muscle weakness (generalized): Secondary | ICD-10-CM

## 2017-12-02 DIAGNOSIS — G8929 Other chronic pain: Secondary | ICD-10-CM

## 2017-12-02 DIAGNOSIS — M62838 Other muscle spasm: Secondary | ICD-10-CM | POA: Diagnosis not present

## 2017-12-02 NOTE — Therapy (Signed)
[place] [address] [place], [place], [address] Phone: [phone]   Fax:  [phone]  Physical Therapy Treatment  Patient Details  Name: Isabella Dixon MRN: [id number] Date of Birth: 24-Feb-1946 Referring Provider: Durward Fortes   Encounter Date: 12/02/2017  PT End of Session - 12/02/17 1125    Visit Number  17    Date for PT Re-Evaluation  12/26/17    PT Start Time  0932    PT Stop Time  1034    PT Time Calculation (min)  62 min    Activity Tolerance  Patient tolerated treatment well    Behavior During Therapy  [facility] for tasks assessed/performed       Past Medical History:  Diagnosis Date  . Anemia   . Arthritis   . Diabetes mellitus without complication ([place])   . Gout   . High cholesterol   . Hypertension   . Osteoporosis     Past Surgical History:  Procedure Laterality Date  . ABDOMINAL HYSTERECTOMY    . TUBAL LIGATION      There were no vitals filed for this visit.  Subjective Assessment - 12/02/17 0932    Subjective  "I sore today from yesterday PT session in my hip"    Currently in Pain?  Yes    Pain Score  3     Pain Location  Hip    Pain Orientation  Posterior    Pain Descriptors / Indicators  Sore                       [facility] Adult PT Treatment/Exercise - 12/02/17 0001      Lumbar Exercises: Aerobic   Recumbent Bike  L1 60mn      Lumbar Exercises: Machines for Strengthening   Cybex Lumbar Extension  black band 15 ext 2 sets    Other Lumbar Machine Exercise  row 25lb 1x15, 20lb 1x15 and lat 20# 2 sets 15      Lumbar Exercises: Standing   Theraband Level (Row)  Level 4 (Blue) 2x10 reps vc needed for posture cues and to retract scapula    Other Standing Lumbar Exercises  Sit to stand 1x10, STS 2x10 with red ball      Lumbar Exercises: Supine   Basic Lumbar Stabilization  10 reps overhead ball diagonal and bring downs             PT Education - 12/01/17  1040    Education provided  Yes    Education Details  reviewed stretching for home and explained importance    Person(s) Educated  Patient    Methods  Explanation;Demonstration    Comprehension  Verbalized understanding;Returned demonstration       PT Short Term Goals - 10/06/17 0953      PT SHORT TERM GOAL #1   Title  independent with initial HEP     Baseline  stretches    Status  Achieved        PT Long Term Goals - 12/01/17 1013      PT LONG TERM GOAL #1   Title  decrease pain 50% for OQL and functional use    Status  Achieved      PT LONG TERM GOAL #2   Title  reports bowling regularly again with no pain or limitation    Status  Achieved      PT LONG TERM GOAL #3   Title  reports  being able to do normal yardwork  and gardening without pain or limitation    Status  Partially Met            Plan - 12/02/17 1126    Clinical Impression Statement  Pt reported soreness in her posterior hip from PT yesterday, so session focused on upper back strengthening exercises  . Pt needed verbal and tactile cuing for posture and technique for TB rows, machine rows and lat pull downs.     PT Next Visit Plan  Core ex       Patient will benefit from skilled therapeutic intervention in order to improve the following deficits and impairments:  Pain, Increased muscle spasms, Impaired tone, Decreased strength, Difficulty walking, Impaired flexibility  Visit Diagnosis: Chronic bilateral low back pain, with sciatica presence unspecified  Muscle weakness (generalized)     Problem List Patient Active Problem List   Diagnosis Date Noted  . History of sciatica 09/06/2017  . Type 2 diabetes mellitus without complication, without long-term current use of insulin ([place]) 09/06/2017  . History of gout 09/06/2017  . Diabetes mellitus type 2 in obese ([place]) 05/13/2015  . Hypothyroid 07/25/2012  . Gout 05/02/2012  . Osteoarthritis 05/02/2012  . Hypertension 05/02/2012  . Hyperlipidemia  05/02/2012  . Abnormal pap 05/02/2012  . Hyperglycemia 05/02/2012    Loyal Gambler 12/02/2017, 11:38 AM  [place] [address] [place], [place], [address] Phone: [phone]   Fax:  [phone]  Name: Isabella Dixon MRN: [id number] Date of Birth: 09/01/1945

## 2017-12-05 ENCOUNTER — Ambulatory Visit: Payer: Medicare Other | Admitting: Physical Therapy

## 2017-12-05 ENCOUNTER — Encounter: Payer: Self-pay | Admitting: Physical Therapy

## 2017-12-05 DIAGNOSIS — M545 Low back pain: Principal | ICD-10-CM

## 2017-12-05 DIAGNOSIS — M62838 Other muscle spasm: Secondary | ICD-10-CM

## 2017-12-05 DIAGNOSIS — M6281 Muscle weakness (generalized): Secondary | ICD-10-CM

## 2017-12-05 DIAGNOSIS — G8929 Other chronic pain: Secondary | ICD-10-CM

## 2017-12-05 NOTE — Therapy (Signed)
[place], [place], [address] Phone: [phone]   Fax:  [phone]  Physical Therapy Treatment  Patient Details  Name: Isabella Dixon MRN: [id number] Date of Birth: 05/18/1946 Referring Provider: Durward Fortes   Encounter Date: 12/05/2017  PT End of Session - 12/05/17 0920    Visit Number  18    Date for PT Re-Evaluation  12/26/17    PT Start Time  0835    PT Stop Time  0935    PT Time Calculation (min)  60 min    Activity Tolerance  Patient tolerated treatment well    Behavior During Therapy  [facility] for tasks assessed/performed       Past Medical History:  Diagnosis Date  . Anemia   . Arthritis   . Diabetes mellitus without complication ([place])   . Gout   . High cholesterol   . Hypertension   . Osteoporosis     Past Surgical History:  Procedure Laterality Date  . ABDOMINAL HYSTERECTOMY    . TUBAL LIGATION      There were no vitals filed for this visit.  Subjective Assessment - 12/05/17 0839    Subjective  I sat a lot over the weekend doing taxes and I am hurting a little more.    Currently in Pain?  Yes    Pain Score  4     Pain Location  Back    Pain Orientation  Left;Lower    Pain Descriptors / Indicators  Aching;Sore    Aggravating Factors   sitting                       [facility] Adult PT Treatment/Exercise - 12/05/17 0001      Ambulation/Gait   Gait Comments  gait around building brisk pace, no rest, she was SOB and at the end reported some left buttock pain increased      Lumbar Exercises: Stretches   Passive Hamstring Stretch  20 seconds;Right;Left;4 reps    Piriformis Stretch  20 seconds;4 reps      Lumbar Exercises: Machines for Strengthening   Cybex Lumbar Extension  black band 15 ext 2 sets    Other Lumbar Machine Exercise  row 25lb 1x15, 20lb 1x15 and lat 20# 2 sets 15      Lumbar Exercises: Standing   Other Standing Lumbar Exercises  back to  wall weighted ball trunk rotaiton and overhead lifts    Other Standing Lumbar Exercises  2.5# hip extension and abduction      Lumbar Exercises: Supine   Bridge with clamshell  20 reps;2 seconds    Other Supine Lumbar Exercises  feet on ball K2C, trunk rotation, small bridges, isometric abdominals      Cryotherapy   Number Minutes Cryotherapy  15 Minutes    Cryotherapy Location  Lumbar Spine    Type of Cryotherapy  Ice pack      Electrical Stimulation   Electrical Stimulation Location  left buttock    Electrical Stimulation Action  IFC    Electrical Stimulation Parameters  supine    Electrical Stimulation Goals  Pain               PT Short Term Goals - 10/06/17 0953      PT SHORT TERM GOAL #1   Title  independent with initial HEP     Baseline  stretches    Status  Achieved  PT Long Term Goals - 12/01/17 1013      PT LONG TERM GOAL #1   Title  decrease pain 50% for OQL and functional use    Status  Achieved      PT LONG TERM GOAL #2   Title  reports bowling regularly again with no pain or limitation    Status  Achieved      PT LONG TERM GOAL #3   Title  reports being able to do normal yardwork  and gardening without pain or limitation    Status  Partially Met            Plan - 12/05/17 0921    Clinical Impression Statement  Patient is pretty active at home, has been able to return to bowling some and has been doing yardwork, she reports that she used to like walking but stopped due to pain, so we tried today with some increased pain after 1 lap around the building.  She is tight and reports that the pirifomis stretches feel good    PT Next Visit Plan  continje to work on walking and core    Consulted and Agree with Plan of Care  Patient       Patient will benefit from skilled therapeutic intervention in order to improve the following deficits and impairments:  Pain, Increased muscle spasms, Impaired tone, Decreased strength, Difficulty walking,  Impaired flexibility  Visit Diagnosis: Chronic bilateral low back pain, with sciatica presence unspecified  Muscle weakness (generalized)  Other muscle spasm     Problem List Patient Active Problem List   Diagnosis Date Noted  . History of sciatica 09/06/2017  . Type 2 diabetes mellitus without complication, without long-term current use of insulin ([place]) 09/06/2017  . History of gout 09/06/2017  . Diabetes mellitus type 2 in obese ([place]) 05/13/2015  . Hypothyroid 07/25/2012  . Gout 05/02/2012  . Osteoarthritis 05/02/2012  . Hypertension 05/02/2012  . Hyperlipidemia 05/02/2012  . Abnormal pap 05/02/2012  . Hyperglycemia 05/02/2012    Sumner Boast., PT 12/05/2017, 9:24 AM  [place] [address] [facility] [place], [place], [address] Phone: [phone]   Fax:  [phone]  Name: Isabella Dixon MRN: [id number] Date of Birth: [birth year]

## 2017-12-07 ENCOUNTER — Encounter: Payer: Self-pay | Admitting: Physical Therapy

## 2017-12-07 ENCOUNTER — Ambulatory Visit: Payer: Medicare Other | Admitting: Physical Therapy

## 2017-12-07 DIAGNOSIS — M62838 Other muscle spasm: Secondary | ICD-10-CM

## 2017-12-07 DIAGNOSIS — G8929 Other chronic pain: Secondary | ICD-10-CM

## 2017-12-07 DIAGNOSIS — M6281 Muscle weakness (generalized): Secondary | ICD-10-CM

## 2017-12-07 DIAGNOSIS — M545 Low back pain: Principal | ICD-10-CM

## 2017-12-07 DIAGNOSIS — M6289 Other specified disorders of muscle: Secondary | ICD-10-CM

## 2017-12-07 NOTE — Therapy (Signed)
[place], [place], [address] Phone: [phone]   Fax:  [phone]  Physical Therapy Treatment  Patient Details  Name: Isabella Dixon MRN: [id number] Date of Birth: 1946-03-19 Referring Provider: Durward Fortes   Encounter Date: 12/07/2017  PT End of Session - 12/07/17 1748    Visit Number  19    Date for PT Re-Evaluation  12/26/17    PT Start Time  1652    PT Stop Time  1755    PT Time Calculation (min)  63 min    Activity Tolerance  Patient tolerated treatment well    Behavior During Therapy  [facility] for tasks assessed/performed       Past Medical History:  Diagnosis Date  . Anemia   . Arthritis   . Diabetes mellitus without complication ([place])   . Gout   . High cholesterol   . Hypertension   . Osteoporosis     Past Surgical History:  Procedure Laterality Date  . ABDOMINAL HYSTERECTOMY    . TUBAL LIGATION      There were no vitals filed for this visit.  Subjective Assessment - 12/07/17 1700    Subjective  Patient reports that she lifted some wood and put in the wood splitter yesterday, she reports that she is pretty sore in the low back and the buttock    Currently in Pain?  Yes    Pain Score  4     Pain Location  Back    Pain Orientation  Left;Lower                       [facility] Adult PT Treatment/Exercise - 12/07/17 0001      Ambulation/Gait   Gait Comments  gait around building brisk pace, no rest, she was SOB and at the end reported some left buttock pain increased      Lumbar Exercises: Stretches   Passive Hamstring Stretch  Right;Left;4 reps;20 seconds    Piriformis Stretch  Right;Left;4 reps;20 seconds      Lumbar Exercises: Aerobic   Nustep  L 5 5 min      Lumbar Exercises: Standing   Other Standing Lumbar Exercises  ball on wall partial squats      Cryotherapy   Number Minutes Cryotherapy  15 Minutes    Cryotherapy Location  Lumbar Spine    Type of Cryotherapy  Ice pack      Electrical Stimulation   Electrical Stimulation Location  left buttock    Electrical Stimulation Action  IFC    Electrical Stimulation Parameters  supine    Electrical Stimulation Goals  Pain             PT Education - 12/07/17 1747    Education provided  Yes    Education Details  started the process of after PT program, gave 3 colors of Tband, hip 4 way, scapular stabilization and supine physioball exercises,  Also gave her info about TENS, she had a lot of questions that I answered    Person(s) Educated  Patient    Methods  Explanation;Demonstration;Tactile cues;Verbal cues;Handout    Comprehension  Verbalized understanding;Returned demonstration;Verbal cues required;Tactile cues required;Need further instruction       PT Short Term Goals - 10/06/17 9323      PT SHORT TERM GOAL #1   Title  independent with initial HEP     Baseline  stretches    Status  Achieved  PT Long Term Goals - 12/07/17 1752      PT LONG TERM GOAL #3   Title  reports being able to do normal yardwork  and gardening without pain or limitation    Status  Partially Met            Plan - 12/07/17 1750    Clinical Impression Statement  Patient with a lot of questions about exercises after PT, including walking program, walking in water and TENS.  She needs cues to do correctly and she tends to over do, so a lot of the conversation was on stretching daily but other exercises doing 2-3x/week    PT Next Visit Plan  continue with working on after PT home care and program    Consulted and Agree with Plan of Care  Patient       Patient will benefit from skilled therapeutic intervention in order to improve the following deficits and impairments:  Pain, Increased muscle spasms, Impaired tone, Decreased strength, Difficulty walking, Impaired flexibility  Visit Diagnosis: Chronic bilateral low back pain, with sciatica presence unspecified  Muscle weakness  (generalized)  Other muscle spasm  Muscle tone increased     Problem List Patient Active Problem List   Diagnosis Date Noted  . History of sciatica 09/06/2017  . Type 2 diabetes mellitus without complication, without long-term current use of insulin ([place]) 09/06/2017  . History of gout 09/06/2017  . Diabetes mellitus type 2 in obese ([place]) 05/13/2015  . Hypothyroid 07/25/2012  . Gout 05/02/2012  . Osteoarthritis 05/02/2012  . Hypertension 05/02/2012  . Hyperlipidemia 05/02/2012  . Abnormal pap 05/02/2012  . Hyperglycemia 05/02/2012    Sumner Boast., PT 12/07/2017, 5:52 PM  [place] [address] [address] [place], [place], [address] Phone: [phone]   Fax:  [phone]  Name: Isabella Dixon MRN: [id number] Date of Birth: 1946-04-14

## 2017-12-12 ENCOUNTER — Encounter: Payer: Self-pay | Admitting: Physical Therapy

## 2017-12-12 ENCOUNTER — Ambulatory Visit: Payer: Medicare Other | Admitting: Physical Therapy

## 2017-12-12 DIAGNOSIS — M6281 Muscle weakness (generalized): Secondary | ICD-10-CM

## 2017-12-12 DIAGNOSIS — M6289 Other specified disorders of muscle: Secondary | ICD-10-CM

## 2017-12-12 DIAGNOSIS — M62838 Other muscle spasm: Secondary | ICD-10-CM

## 2017-12-12 DIAGNOSIS — G8929 Other chronic pain: Secondary | ICD-10-CM

## 2017-12-12 DIAGNOSIS — M545 Low back pain: Principal | ICD-10-CM

## 2017-12-12 NOTE — Therapy (Signed)
[place], [place], [address] Phone: [phone]   Fax:  [phone]  Physical Therapy Treatment This covers period and visits from 10-20 Patient Details  Name: Isabella Dixon MRN: [id number] Date of Birth: Mar 02, 1946 Referring Provider: Durward Fortes   Encounter Date: 12/12/2017  PT End of Session - 12/12/17 1647    Visit Number  20    Date for PT Re-Evaluation  12/26/17    PT Start Time  1614    PT Stop Time  1702    PT Time Calculation (min)  48 min    Activity Tolerance  Patient tolerated treatment well    Behavior During Therapy  [facility] for tasks assessed/performed       Past Medical History:  Diagnosis Date  . Anemia   . Arthritis   . Diabetes mellitus without complication ([place])   . Gout   . High cholesterol   . Hypertension   . Osteoporosis     Past Surgical History:  Procedure Laterality Date  . ABDOMINAL HYSTERECTOMY    . TUBAL LIGATION      There were no vitals filed for this visit.  Subjective Assessment - 12/12/17 1614    Subjective  Patient reports that she went for a walk and had some increased pain, she does report that she was doing flowers [place] prior to that.    Currently in Pain?  Yes    Pain Score  5     Pain Location  Back    Pain Orientation  Left;Lower    Aggravating Factors   walking                       [facility] Adult PT Treatment/Exercise - 12/12/17 0001      Ambulation/Gait   Gait Comments  gait around building brisk pace, no rest, she was SOB and at the end reported some left buttock pain increased when going up hill      Lumbar Exercises: Stretches   Passive Hamstring Stretch  Right;Left;4 reps;20 seconds    Piriformis Stretch  Right;Left;4 reps;20 seconds    Gastroc Stretch  20 seconds;3 reps      Lumbar Exercises: Aerobic   Nustep  L 5 5 min      Lumbar Exercises: Standing   Other Standing Lumbar Exercises  ball on  wall partial squats      Lumbar Exercises: Supine   Other Supine Lumbar Exercises  feet on ball K2C, trunk rotation, small bridges, isometric abdominals      Cryotherapy   Number Minutes Cryotherapy  15 Minutes    Cryotherapy Location  Lumbar Spine    Type of Cryotherapy  Ice pack      Electrical Stimulation   Electrical Stimulation Location  left buttock    Electrical Stimulation Action  IFC    Electrical Stimulation Parameters  supine    Electrical Stimulation Goals  Pain      Manual Therapy   Manual Therapy  Soft tissue mobilization    Soft tissue mobilization  left buttock, piriformis               PT Short Term Goals - 10/06/17 0165      PT SHORT TERM GOAL #1   Title  independent with initial HEP     Baseline  stretches    Status  Achieved        PT Long Term Goals -  12/07/17 1752      PT LONG TERM GOAL #3   Title  reports being able to do normal yardwork  and gardening without pain or limitation    Status  Partially Met            Plan - 12/12/17 1729    Clinical Impression Statement  Patient with some increased pain over the weekend, we were working toward D/C she is now fearful, she is tight and tender in the buttock.  She reports that she did not feel like she over did it.      PT Next Visit Plan  may skip a week and see how she does as we continue to prepare for D/C    Consulted and Agree with Plan of Care  Patient       Patient will benefit from skilled therapeutic intervention in order to improve the following deficits and impairments:  Pain, Increased muscle spasms, Impaired tone, Decreased strength, Difficulty walking, Impaired flexibility  Visit Diagnosis: Chronic bilateral low back pain, with sciatica presence unspecified  Muscle weakness (generalized)  Other muscle spasm  Muscle tone increased     Problem List Patient Active Problem List   Diagnosis Date Noted  . History of sciatica 09/06/2017  . Type 2 diabetes mellitus  without complication, without long-term current use of insulin ([place]) 09/06/2017  . History of gout 09/06/2017  . Diabetes mellitus type 2 in obese ([place]) 05/13/2015  . Hypothyroid 07/25/2012  . Gout 05/02/2012  . Osteoarthritis 05/02/2012  . Hypertension 05/02/2012  . Hyperlipidemia 05/02/2012  . Abnormal pap 05/02/2012  . Hyperglycemia 05/02/2012    Sumner Boast., PT 12/12/2017, 5:42 PM  [place] [address] [place], [place], [address] Phone: [phone]   Fax:  [phone]  Name: Isabella Dixon MRN: [id number] Date of Birth: [birth year]

## 2017-12-14 ENCOUNTER — Encounter: Payer: Self-pay | Admitting: Physical Therapy

## 2017-12-14 ENCOUNTER — Ambulatory Visit: Payer: Medicare Other | Admitting: Physical Therapy

## 2017-12-14 DIAGNOSIS — M6281 Muscle weakness (generalized): Secondary | ICD-10-CM

## 2017-12-14 DIAGNOSIS — M62838 Other muscle spasm: Secondary | ICD-10-CM

## 2017-12-14 DIAGNOSIS — M545 Low back pain: Principal | ICD-10-CM

## 2017-12-14 DIAGNOSIS — G8929 Other chronic pain: Secondary | ICD-10-CM

## 2017-12-14 DIAGNOSIS — M6289 Other specified disorders of muscle: Secondary | ICD-10-CM

## 2017-12-14 NOTE — Therapy (Signed)
[place], [place], [address] Phone: [phone]   Fax:  [phone]  Physical Therapy Treatment  Patient Details  Name: BLESSIN KANNO MRN: [id number] Date of Birth: November 03, 1945 Referring Provider: Durward Fortes   Encounter Date: 12/14/2017  PT End of Session - 12/14/17 1745    Visit Number  21    Date for PT Re-Evaluation  12/26/17    PT Start Time  4967    PT Stop Time  1750    PT Time Calculation (min)  60 min    Activity Tolerance  Patient tolerated treatment well    Behavior During Therapy  [facility] for tasks assessed/performed       Past Medical History:  Diagnosis Date  . Anemia   . Arthritis   . Diabetes mellitus without complication ([place])   . Gout   . High cholesterol   . Hypertension   . Osteoporosis     Past Surgical History:  Procedure Laterality Date  . ABDOMINAL HYSTERECTOMY    . TUBAL LIGATION      There were no vitals filed for this visit.  Subjective Assessment - 12/14/17 1739    Subjective  Patient has continued to have some increased back and hip pain.      Currently in Pain?  Yes    Pain Score  5     Pain Location  Back    Pain Orientation  Lower                       [facility] Adult PT Treatment/Exercise - 12/14/17 0001      Therapeutic Activites    Therapeutic Activities  Lifting;ADL's    ADL's  vacuum, raking, clothes, foot prop    Lifting  golfer's, power lift, no twist, weight next to body, PT explanation and demonstration, patient abel to retunr demo      Lumbar Exercises: Stretches   Passive Hamstring Stretch  Right;Left;4 reps;20 seconds    ITB Stretch  4 reps;20 seconds    Piriformis Stretch  Right;Left;4 reps;20 seconds    Gastroc Stretch  20 seconds;3 reps      Lumbar Exercises: Aerobic   Nustep  L 5 6 min      Lumbar Exercises: Supine   Other Supine Lumbar Exercises  feet on ball K2C, trunk rotation, small bridges, isometric  abdominals      Electrical Stimulation   Electrical Stimulation Location  left buttock    Electrical Stimulation Action  IFC    Electrical Stimulation Parameters  supine    Electrical Stimulation Goals  Pain             PT Education - 12/14/17 1744    Education provided  Yes    Education Details  reviewed HEP and how to go slow and advance, she verbalized understanding    Person(s) Educated  Patient    Methods  Explanation;Demonstration    Comprehension  Verbalized understanding;Returned demonstration       PT Short Term Goals - 10/06/17 0953      PT SHORT TERM GOAL #1   Title  independent with initial HEP     Baseline  stretches    Status  Achieved        PT Long Term Goals - 12/14/17 1747      PT LONG TERM GOAL #1   Title  decrease pain 50% for OQL and functional use  Status  Achieved      PT LONG TERM GOAL #2   Title  reports bowling regularly again with no pain or limitation    Status  Achieved      PT LONG TERM GOAL #3   Title  reports being able to do normal yardwork  and gardening without pain or limitation    Status  Partially Met            Plan - 12/14/17 1745    Clinical Impression Statement  Patient has continued to have some increased pain, she is tight and tender in the piriformis area.  She was able to verbalize and demonstrate the body mechanicxs that I instructed her in today, seemed to have a good understanding, I spoke with her about going slow with exercises and that she can skip days.    PT Next Visit Plan  we will hold for a week and see her 1x after that to assure that she is doing okay    Consulted and Agree with Plan of Care  Patient       Patient will benefit from skilled therapeutic intervention in order to improve the following deficits and impairments:  Pain, Increased muscle spasms, Impaired tone, Decreased strength, Difficulty walking, Impaired flexibility  Visit Diagnosis: Chronic bilateral low back pain, with  sciatica presence unspecified  Muscle weakness (generalized)  Other muscle spasm  Muscle tone increased     Problem List Patient Active Problem List   Diagnosis Date Noted  . History of sciatica 09/06/2017  . Type 2 diabetes mellitus without complication, without long-term current use of insulin ([place]) 09/06/2017  . History of gout 09/06/2017  . Diabetes mellitus type 2 in obese ([place]) 05/13/2015  . Hypothyroid 07/25/2012  . Gout 05/02/2012  . Osteoarthritis 05/02/2012  . Hypertension 05/02/2012  . Hyperlipidemia 05/02/2012  . Abnormal pap 05/02/2012  . Hyperglycemia 05/02/2012    Sumner Boast., PT 12/14/2017, 5:48 PM  [place] [address] [place], [place], [address] Phone: [phone]   Fax:  [phone]  Name: GIMENA BUICK MRN: [id number] Date of Birth: 1945-10-27

## 2017-12-23 ENCOUNTER — Ambulatory Visit: Payer: Medicare Other | Admitting: Physical Therapy

## 2017-12-28 ENCOUNTER — Encounter: Payer: Self-pay | Admitting: Physical Therapy

## 2017-12-28 ENCOUNTER — Ambulatory Visit: Payer: Medicare Other | Attending: Orthopaedic Surgery | Admitting: Physical Therapy

## 2017-12-28 DIAGNOSIS — G8929 Other chronic pain: Secondary | ICD-10-CM

## 2017-12-28 DIAGNOSIS — M545 Low back pain: Secondary | ICD-10-CM | POA: Diagnosis present

## 2017-12-28 DIAGNOSIS — M6281 Muscle weakness (generalized): Secondary | ICD-10-CM | POA: Diagnosis present

## 2017-12-28 DIAGNOSIS — M6289 Other specified disorders of muscle: Secondary | ICD-10-CM

## 2017-12-28 DIAGNOSIS — M62838 Other muscle spasm: Secondary | ICD-10-CM | POA: Diagnosis present

## 2017-12-28 NOTE — Therapy (Signed)
[place] [place], [place], [address] Phone: [phone]   Fax:  [phone]  Physical Therapy Treatment  Patient Details  Name: TASHEKA HOUSEMAN MRN: [id number] Date of Birth: 05-Apr-1946 Referring Provider: Durward Fortes   Encounter Date: 12/28/2017  PT End of Session - 12/28/17 1006    Visit Number  22    PT Start Time  0843    PT Stop Time  0945    PT Time Calculation (min)  62 min    Activity Tolerance  Patient tolerated treatment well    Behavior During Therapy  [facility] for tasks assessed/performed       Past Medical History:  Diagnosis Date  . Anemia   . Arthritis   . Diabetes mellitus without complication ([place])   . Gout   . High cholesterol   . Hypertension   . Osteoporosis     Past Surgical History:  Procedure Laterality Date  . ABDOMINAL HYSTERECTOMY    . TUBAL LIGATION      There were no vitals filed for this visit.  Subjective Assessment - 12/28/17 1003    Subjective  Patient reports that she got the TENS delivered.  She reports that she has been doing well, reports that she mowed a yard recently and had some increased pain    Currently in Pain?  No/denies                       [facility] Adult PT Treatment/Exercise - 12/28/17 0001      Ambulation/Gait   Gait Comments  gait around building brisk pace, no rest, she was SOB and at the end reported some left buttock pain increased when going up hill      Therapeutic Activites    Therapeutic Activities  Other Therapeutic Activities    Other Therapeutic Activities  Patient brought in the TENS unit, I went over and demonstrated its use with battery, electrodes, modes, timer, freq, duration, safety, I applied and then had patient do a return demonstration to assure her safety      Lumbar Exercises: Stretches   Active Hamstring Stretch  3 reps;30 seconds    Hip Flexor Stretch  2 reps;20 seconds    ITB Stretch  3 reps;20  seconds    Piriformis Stretch  3 reps;20 seconds    Gastroc Stretch  3 reps;20 seconds             PT Education - 12/28/17 1005    Education provided  Yes    Education Details  Reviewed the HEP, reviewed water exercises, went over how to add and what to look out for     [employer]) Educated  Patient    Methods  Explanation;Demonstration    Comprehension  Verbalized understanding       PT Short Term Goals - 10/06/17 0953      PT SHORT TERM GOAL #1   Title  independent with initial HEP     Baseline  stretches    Status  Achieved        PT Long Term Goals - 12/28/17 1007      PT LONG TERM GOAL #1   Title  decrease pain 50% for OQL and functional use    Status  Achieved      PT LONG TERM GOAL #2   Title  reports bowling regularly again with no pain or limitation    Status  Achieved  PT LONG TERM GOAL #3   Title  reports being able to do normal yardwork  and gardening without pain or limitation    Status  Achieved            Plan - 12/28/17 1006    Clinical Impression Statement  Patient reports that overall she feels like she is doing well, she reports that she understands the TENS the HEP and the body mechanis after a refresher today.      PT Next Visit Plan  Discharge with goals met    Consulted and Agree with Plan of Care  Patient       Patient will benefit from skilled therapeutic intervention in order to improve the following deficits and impairments:  Pain, Increased muscle spasms, Impaired tone, Decreased strength, Difficulty walking, Impaired flexibility  Visit Diagnosis: Chronic bilateral low back pain, with sciatica presence unspecified  Muscle weakness (generalized)  Other muscle spasm  Muscle tone increased     Problem List Patient Active Problem List   Diagnosis Date Noted  . History of sciatica 09/06/2017  . Type 2 diabetes mellitus without complication, without long-term current use of insulin ([place]) 09/06/2017  . History of  gout 09/06/2017  . Diabetes mellitus type 2 in obese ([place]) 05/13/2015  . Hypothyroid 07/25/2012  . Gout 05/02/2012  . Osteoarthritis 05/02/2012  . Hypertension 05/02/2012  . Hyperlipidemia 05/02/2012  . Abnormal pap 05/02/2012  . Hyperglycemia 05/02/2012    Sumner Boast., PT 12/28/2017, 10:08 AM  [address], [place], [address] Phone: [phone]   Fax:  [phone]  Name: ADELAI ACHEY MRN: [id number] Date of Birth: 1945-12-06

## 2018-02-10 ENCOUNTER — Other Ambulatory Visit: Payer: Self-pay | Admitting: Emergency Medicine

## 2018-02-10 ENCOUNTER — Other Ambulatory Visit: Payer: Self-pay | Admitting: *Deleted

## 2018-02-10 ENCOUNTER — Telehealth: Payer: Self-pay | Admitting: *Deleted

## 2018-02-10 DIAGNOSIS — I10 Essential (primary) hypertension: Secondary | ICD-10-CM

## 2018-02-10 MED ORDER — METOPROLOL TARTRATE 25 MG PO TABS: 25.0000 mg | ORAL_TABLET | Freq: Two times a day (BID) | ORAL | 0 refills | Status: DC

## 2018-02-10 NOTE — Progress Notes (Signed)
Medication refilled for 30 days until seen in office for appt

## 2018-02-10 NOTE — Telephone Encounter (Signed)
Left message in voice mail to call concerning refill on blood pressure medication.

## 2018-02-10 NOTE — Telephone Encounter (Signed)
Pt states to send in medication to the [facility] off of [address]     Copied from CRM [phone]. Topic: Quick Communication - Office Called Patient >> Feb 10, 2018 10:02 AM Omer JackJoyce, Cynthia A, CMA wrote: Reason for CRM: patient requested refill on Lopressor, we will refill for 30 days with no additional. The refill will be sent to a local pharmacy, what pharmacy? [facility] only will fill 90 days. She needs an office visit for additional refills. Last office visit 08/2017.

## 2018-02-10 NOTE — Telephone Encounter (Signed)
Left message in voice mail to call back, will refill Lopressor for 30 days at her local pharmacy. No additional refills until appointment is scheduled. Last office visit 08/2017.

## 2018-02-15 ENCOUNTER — Other Ambulatory Visit: Payer: Self-pay | Admitting: Emergency Medicine

## 2018-02-15 DIAGNOSIS — E785 Hyperlipidemia, unspecified: Secondary | ICD-10-CM

## 2018-02-15 DIAGNOSIS — I10 Essential (primary) hypertension: Secondary | ICD-10-CM

## 2018-02-16 NOTE — Telephone Encounter (Signed)
Attempted to call patient to make an appointment with her pcp. So we can refill her Crestor and Cozaar. Left message for the patient to give the office a call back.

## 2018-02-20 ENCOUNTER — Ambulatory Visit (INDEPENDENT_AMBULATORY_CARE_PROVIDER_SITE_OTHER): Payer: Medicare Other | Admitting: Emergency Medicine

## 2018-02-20 ENCOUNTER — Other Ambulatory Visit: Payer: Self-pay

## 2018-02-20 ENCOUNTER — Encounter: Payer: Self-pay | Admitting: Emergency Medicine

## 2018-02-20 VITALS — BP 120/86 | HR 75 | Temp 98.5°F | Resp 16 | Ht 62.25 in | Wt 186.2 lb

## 2018-02-20 DIAGNOSIS — E119 Type 2 diabetes mellitus without complications: Secondary | ICD-10-CM | POA: Diagnosis not present

## 2018-02-20 DIAGNOSIS — Z8739 Personal history of other diseases of the musculoskeletal system and connective tissue: Secondary | ICD-10-CM | POA: Diagnosis not present

## 2018-02-20 DIAGNOSIS — I10 Essential (primary) hypertension: Secondary | ICD-10-CM

## 2018-02-20 DIAGNOSIS — E038 Other specified hypothyroidism: Secondary | ICD-10-CM

## 2018-02-20 DIAGNOSIS — E785 Hyperlipidemia, unspecified: Secondary | ICD-10-CM

## 2018-02-20 DIAGNOSIS — Z8669 Personal history of other diseases of the nervous system and sense organs: Secondary | ICD-10-CM

## 2018-02-20 MED ORDER — METFORMIN HCL 500 MG PO TABS: 500.0000 mg | ORAL_TABLET | Freq: Two times a day (BID) | ORAL | 3 refills | Status: DC

## 2018-02-20 MED ORDER — ROSUVASTATIN CALCIUM 10 MG PO TABS: ORAL_TABLET | ORAL | 3 refills | Status: DC

## 2018-02-20 MED ORDER — METOPROLOL TARTRATE 25 MG PO TABS: 25.0000 mg | ORAL_TABLET | Freq: Two times a day (BID) | ORAL | 3 refills | Status: DC

## 2018-02-20 MED ORDER — LEVOTHYROXINE SODIUM 75 MCG PO TABS: 75.0000 ug | ORAL_TABLET | Freq: Every day | ORAL | 3 refills | Status: DC

## 2018-02-20 MED ORDER — LOSARTAN POTASSIUM 100 MG PO TABS: ORAL_TABLET | ORAL | 3 refills | Status: DC

## 2018-02-20 MED ORDER — PROBENECID 500 MG PO TABS: 500.0000 mg | ORAL_TABLET | Freq: Two times a day (BID) | ORAL | 3 refills | Status: DC

## 2018-02-20 NOTE — Patient Instructions (Addendum)
   IF you received an x-ray today, you will receive an invoice from [facility]. Please contact [facility] at [phone] with questions or concerns regarding your invoice.   IF you received labwork today, you will receive an invoice from [facility]. Please contact [facility] at [phone] with questions or concerns regarding your invoice.   Our billing staff will not be able to assist you with questions regarding bills from these companies.  You will be contacted with the lab results as soon as they are available. The fastest way to get your results is to activate your My Chart account. Instructions are located on the last page of this paperwork. If you have not heard from us regarding the results in 2 weeks, please contact this office.    Health Maintenance, Female Adopting a healthy lifestyle and getting preventive care can go a long way to promote health and wellness. Talk with your health care provider about what schedule of regular examinations is right for you. This is a good chance for you to check in with your provider about disease prevention and staying healthy. In between checkups, there are plenty of things you can do on your own. Experts have done a lot of research about which lifestyle changes and preventive measures are most likely to keep you healthy. Ask your health care provider for more information. Weight and diet Eat a healthy diet  Be sure to include plenty of vegetables, fruits, low-fat dairy products, and lean protein.  Do not eat a lot of foods high in solid fats, added sugars, or salt.  Get regular exercise. This is one of the most important things you can do for your health. ? Most adults should exercise for at least 150 minutes each week. The exercise should increase your heart rate and make you sweat (moderate-intensity exercise). ? Most adults should also do strengthening exercises at least twice a week. This is in addition to the  moderate-intensity exercise.  Maintain a healthy weight  Body mass index (BMI) is a measurement that can be used to identify possible weight problems. It estimates body fat based on height and weight. Your health care provider can help determine your BMI and help you achieve or maintain a healthy weight.  For females 20 years of age and older: ? A BMI below 18.5 is considered underweight. ? A BMI of 18.5 to 24.9 is normal. ? A BMI of 25 to 29.9 is considered overweight. ? A BMI of 30 and above is considered obese.  Watch levels of cholesterol and blood lipids  You should start having your blood tested for lipids and cholesterol at 71 years of age, then have this test every 5 years.  You may need to have your cholesterol levels checked more often if: ? Your lipid or cholesterol levels are high. ? You are older than 71 years of age. ? You are at high risk for heart disease.  Cancer screening Lung Cancer  Lung cancer screening is recommended for adults 55-80 years old who are at high risk for lung cancer because of a history of smoking.  A yearly low-dose CT scan of the lungs is recommended for people who: ? Currently smoke. ? Have quit within the past 15 years. ? Have at least a 30-pack-year history of smoking. A pack year is smoking an average of one pack of cigarettes a day for 1 year.  Yearly screening should continue until it has been 15 years since you quit.  Yearly screening   should stop if you develop a health problem that would prevent you from having lung cancer treatment.  Breast Cancer  Practice breast self-awareness. This means understanding how your breasts normally appear and feel.  It also means doing regular breast self-exams. Let your health care provider know about any changes, no matter how small.  If you are in your 20s or 30s, you should have a clinical breast exam (CBE) by a health care provider every 1-3 years as part of a regular health exam.  If you  are 42 or older, have a CBE every year. Also consider having a breast X-ray (mammogram) every year.  If you have a family history of breast cancer, talk to your health care provider about genetic screening.  If you are at high risk for breast cancer, talk to your health care provider about having an MRI and a mammogram every year.  Breast cancer gene (BRCA) assessment is recommended for women who have family members with BRCA-related cancers. BRCA-related cancers include: ? Breast. ? Ovarian. ? Tubal. ? Peritoneal cancers.  Results of the assessment will determine the need for genetic counseling and BRCA1 and BRCA2 testing.  Cervical Cancer Your health care provider may recommend that you be screened regularly for cancer of the pelvic organs (ovaries, uterus, and vagina). This screening involves a pelvic examination, including checking for microscopic changes to the surface of your cervix (Pap test). You may be encouraged to have this screening done every 3 years, beginning at age 56.  For women ages 41-65, health care providers may recommend pelvic exams and Pap testing every 3 years, or they may recommend the Pap and pelvic exam, combined with testing for human papilloma virus (HPV), every 5 years. Some types of HPV increase your risk of cervical cancer. Testing for HPV may also be done on women of any age with unclear Pap test results.  Other health care providers may not recommend any screening for nonpregnant women who are considered low risk for pelvic cancer and who do not have symptoms. Ask your health care provider if a screening pelvic exam is right for you.  If you have had past treatment for cervical cancer or a condition that could lead to cancer, you need Pap tests and screening for cancer for at least 20 years after your treatment. If Pap tests have been discontinued, your risk factors (such as having a new sexual partner) need to be reassessed to determine if screening should  resume. Some women have medical problems that increase the chance of getting cervical cancer. In these cases, your health care provider may recommend more frequent screening and Pap tests.  Colorectal Cancer  This type of cancer can be detected and often prevented.  Routine colorectal cancer screening usually begins at 71 years of age and continues through 71 years of age.  Your health care provider may recommend screening at an earlier age if you have risk factors for colon cancer.  Your health care provider may also recommend using home test kits to check for hidden blood in the stool.  A small camera at the end of a tube can be used to examine your colon directly (sigmoidoscopy or colonoscopy). This is done to check for the earliest forms of colorectal cancer.  Routine screening usually begins at age 16.  Direct examination of the colon should be repeated every 5-10 years through 71 years of age. However, you may need to be screened more often if early forms of precancerous polyps or  small growths are found.  Skin Cancer  Check your skin from head to toe regularly.  Tell your health care provider about any new moles or changes in moles, especially if there is a change in a mole's shape or color.  Also tell your health care provider if you have a mole that is larger than the size of a pencil eraser.  Always use sunscreen. Apply sunscreen liberally and repeatedly throughout the day.  Protect yourself by wearing long sleeves, pants, a wide-brimmed hat, and sunglasses whenever you are outside.  Heart disease, diabetes, and high blood pressure  High blood pressure causes heart disease and increases the risk of stroke. High blood pressure is more likely to develop in: ? People who have blood pressure in the high end of the normal range (130-139/85-89 mm Hg). ? People who are overweight or obese. ? People who are African American.  If you are 39-67 years of age, have your blood  pressure checked every 3-5 years. If you are 62 years of age or older, have your blood pressure checked every year. You should have your blood pressure measured twice-once when you are at a hospital or clinic, and once when you are not at a hospital or clinic. Record the average of the two measurements. To check your blood pressure when you are not at a hospital or clinic, you can use: ? An automated blood pressure machine at a pharmacy. ? A home blood pressure monitor.  If you are between 70 years and 25 years old, ask your health care provider if you should take aspirin to prevent strokes.  Have regular diabetes screenings. This involves taking a blood sample to check your fasting blood sugar level. ? If you are at a normal weight and have a low risk for diabetes, have this test once every three years after 71 years of age. ? If you are overweight and have a high risk for diabetes, consider being tested at a younger age or more often. Preventing infection Hepatitis B  If you have a higher risk for hepatitis B, you should be screened for this virus. You are considered at high risk for hepatitis B if: ? You were born in a country where hepatitis B is common. Ask your health care provider which countries are considered high risk. ? Your parents were born in a high-risk country, and you have not been immunized against hepatitis B (hepatitis B vaccine). ? You have HIV or AIDS. ? You use needles to inject street drugs. ? You live with someone who has hepatitis B. ? You have had sex with someone who has hepatitis B. ? You get hemodialysis treatment. ? You take certain medicines for conditions, including cancer, organ transplantation, and autoimmune conditions.  Hepatitis C  Blood testing is recommended for: ? Everyone born from [birth year] through 1964. ? Anyone with known risk factors for hepatitis C.  Sexually transmitted infections (STIs)  You should be screened for sexually transmitted  infections (STIs) including gonorrhea and chlamydia if: ? You are sexually active and are younger than 71 years of age. ? You are older than 71 years of age and your health care provider tells you that you are at risk for this type of infection. ? Your sexual activity has changed since you were last screened and you are at an increased risk for chlamydia or gonorrhea. Ask your health care provider if you are at risk.  If you do not have HIV, but are at risk, it  may be recommended that you take a prescription medicine daily to prevent HIV infection. This is called pre-exposure prophylaxis (PrEP). You are considered at risk if: ? You are sexually active and do not regularly use condoms or know the HIV status of your partner(s). ? You take drugs by injection. ? You are sexually active with a partner who has HIV.  Talk with your health care provider about whether you are at high risk of being infected with HIV. If you choose to begin PrEP, you should first be tested for HIV. You should then be tested every 3 months for as long as you are taking PrEP. Pregnancy  If you are premenopausal and you may become pregnant, ask your health care provider about preconception counseling.  If you may become pregnant, take 400 to 800 micrograms (mcg) of folic acid every day.  If you want to prevent pregnancy, talk to your health care provider about birth control (contraception). Osteoporosis and menopause  Osteoporosis is a disease in which the bones lose minerals and strength with aging. This can result in serious bone fractures. Your risk for osteoporosis can be identified using a bone density scan.  If you are 87 years of age or older, or if you are at risk for osteoporosis and fractures, ask your health care provider if you should be screened.  Ask your health care provider whether you should take a calcium or vitamin D supplement to lower your risk for osteoporosis.  Menopause may have certain physical  symptoms and risks.  Hormone replacement therapy may reduce some of these symptoms and risks. Talk to your health care provider about whether hormone replacement therapy is right for you. Follow these instructions at home:  Schedule regular health, dental, and eye exams.  Stay current with your immunizations.  Do not use any tobacco products including cigarettes, chewing tobacco, or electronic cigarettes.  If you are pregnant, do not drink alcohol.  If you are breastfeeding, limit how much and how often you drink alcohol.  Limit alcohol intake to no more than 1 drink per day for nonpregnant women. One drink equals 12 ounces of beer, 5 ounces of wine, or 1 ounces of hard liquor.  Do not use street drugs.  Do not share needles.  Ask your health care provider for help if you need support or information about quitting drugs.  Tell your health care provider if you often feel depressed.  Tell your health care provider if you have ever been abused or do not feel safe at home. This information is not intended to replace advice given to you by your health care provider. Make sure you discuss any questions you have with your health care provider. Document Released: 02/22/2011 Document Revised: 01/15/2016 Document Reviewed: 05/13/2015 Elsevier Interactive Patient Education  [employer].

## 2018-02-20 NOTE — Progress Notes (Signed)
Isabella Dixon 72 y.o.   Chief Complaint  Patient presents with  . Medication Refill    medications with RF(refills)    HISTORY OF PRESENT ILLNESS: This is a 72 y.o. female here for follow-up of multiple medical problems.  Doing well.  Has no complaints. 1.  Essential hypertension.  On losartan and Lopressor.  Doing well.  Normal blood pressure today. BP Readings from Last 3 Encounters:  02/20/18 120/86  09/16/17 (!) 158/63  09/06/17 138/86   No home blood pressure readings. 2.  Diabetes type 2.  On metformin.  Doing well. Lab Results  Component Value Date   HGBA1C 5.9 (H) 09/06/2017   No home glucose measurements available. 3.  History of hypothyroidism.  On Synthroid.  Well controlled. 4  history of dyslipidemia.  On Crestor. Lab Results  Component Value Date   CHOL 150 09/06/2017   HDL 43 09/06/2017   LDLCALC 60 09/06/2017   TRIG 235 (H) 09/06/2017   CHOLHDL 3.5 09/06/2017   Wt Readings from Last 3 Encounters:  02/20/18 186 lb 3.2 oz (84.5 kg)  10/10/17 185 lb (83.9 kg)  09/16/17 165 lb (74.8 kg)    5.  History of gout.  On probenecid twice a day. Needs medications refills. 6.  history of sciatica.  Just finished physical therapy.  Much improved feeling better. HPI   Prior to Admission medications   Medication Sig Start Date End Date Taking? Authorizing Provider  chlorpheniramine (CHLOR-TRIMETON) 4 MG tablet Take 4 mg by mouth 2 (two) times daily as needed for allergies.   Yes [provider]  Cholecalciferol (VITAMIN D-3 PO) Take 50 mcg by mouth daily.   Yes [provider]  gabapentin (NEURONTIN) 100 MG capsule Take 2 capsules at bedtime. 09/06/17  Yes Rashan Rounsaville, Ines Bloomer, MD  levothyroxine (SYNTHROID, LEVOTHROID) 75 MCG tablet Take 1 tablet (75 mcg total) by mouth daily. 09/06/17  Yes Horald Pollen, MD  losartan (COZAAR) 100 MG tablet TAKE 1 TABLET DAILY (OFFICE VISIT NEEDED) 02/17/18  Yes Horald Pollen, MD  Magnesium  250 MG TABS Take by mouth daily.   Yes [provider]  metoprolol tartrate (LOPRESSOR) 25 MG tablet Take 1 tablet (25 mg total) by mouth 2 (two) times daily. 02/10/18  Yes Rachana Malesky, Ines Bloomer, MD  Multiple Vitamin (MULTIVITAMIN) tablet Take 1 tablet by mouth daily.   Yes [provider]  Naproxen Sodium (ALEVE PO) Take by mouth.   Yes [provider]  rosuvastatin (CRESTOR) 10 MG tablet TAKE 1 TABLET DAILY (OFFICE VISIT NEEDED) 02/17/18  Yes Drevon Plog, Ines Bloomer, MD  vitamin C (ASCORBIC ACID) 250 MG tablet Take 250 mg by mouth daily.   Yes [provider]  aspirin 81 MG tablet Aspirin Low Dose 81 mg tablet,delayed release  Take 1 tablet every day by oral route.    [provider]  Choline Fenofibrate (FENOFIBRIC ACID) 135 MG CPDR Take 135 mg by mouth daily. Patient not taking: Reported on 10/10/2017 11/21/15   Darlyne Russian, MD  metFORMIN (GLUCOPHAGE) 500 MG tablet Take 1 tablet (500 mg total) by mouth 2 (two) times daily with a meal. 09/06/17 12/05/17  Sacoya Mcgourty, Ines Bloomer, MD  OVER THE COUNTER MEDICATION OTC Vit D 1400 mg daily    [provider]  probenecid (BENEMID) 500 MG tablet Take 1 tablet (500 mg total) by mouth 2 (two) times daily. 09/16/17 12/15/17  Horald Pollen, MD    No Known Allergies  Patient Active Problem List  Diagnosis Date Noted  . History of sciatica 09/06/2017  . Type 2 diabetes mellitus without complication, without long-term current use of insulin (HCC) 09/06/2017  . History of gout 09/06/2017  . Diabetes mellitus type 2 in obese (HCC) 05/13/2015  . Hypothyroid 07/25/2012  . Gout 05/02/2012  . Osteoarthritis 05/02/2012  . Hypertension 05/02/2012  . Hyperlipidemia 05/02/2012  . Abnormal pap 05/02/2012  . Hyperglycemia 05/02/2012    Past Medical History:  Diagnosis Date  . Anemia   . Arthritis   . Diabetes mellitus without complication (HCC)   . Gout   . High cholesterol   . Hypertension   .  Osteoporosis     Past Surgical History:  Procedure Laterality Date  . ABDOMINAL HYSTERECTOMY    . TUBAL LIGATION      Social History   Socioeconomic History  . Marital status: Married    Spouse name: Not on file  . Number of children: Not on file  . Years of education: Not on file  . Highest education level: Not on file  Occupational History  . Not on file  Social Needs  . Financial resource strain: Not on file  . Food insecurity:    Worry: Not on file    Inability: Not on file  . Transportation needs:    Medical: Not on file    Non-medical: Not on file  Tobacco Use  . Smoking status: Never Smoker  . Smokeless tobacco: Never Used  Substance and Sexual Activity  . Alcohol use: No  . Drug use: No  . Sexual activity: Yes  Lifestyle  . Physical activity:    Days per week: Not on file    Minutes per session: Not on file  . Stress: Not on file  Relationships  . Social connections:    Talks on phone: Not on file    Gets together: Not on file    Attends religious service: Not on file    Active member of club or organization: Not on file    Attends meetings of clubs or organizations: Not on file    Relationship status: Not on file  . Intimate partner violence:    Fear of current or ex partner: Not on file    Emotionally abused: Not on file    Physically abused: Not on file    Forced sexual activity: Not on file  Other Topics Concern  . Not on file  Social History Narrative  . Not on file    Family History  Problem Relation Age of Onset  . Heart disease Mother   . Stroke Mother   . Hypertension Mother   . Stroke Father   . Cancer Sister   . Cancer Son   . Stroke Paternal Grandmother   . Cancer Paternal Grandmother   . Stroke Maternal Grandmother   . Cancer Paternal Grandfather      Review of Systems  Constitutional: Negative.  Negative for chills, fever and weight loss.  HENT: Negative.  Negative for hearing loss, nosebleeds and sore throat.   Eyes:  Negative.  Negative for blurred vision and double vision.  Respiratory: Negative for cough, shortness of breath and wheezing.   Cardiovascular: Negative.  Negative for chest pain, palpitations and leg swelling.  Gastrointestinal: Negative.  Negative for abdominal pain, blood in stool, diarrhea, nausea and vomiting.  Genitourinary: Negative.  Negative for dysuria and hematuria.  Musculoskeletal: Positive for back pain (Occasional sciatic pain). Negative for joint pain.  Skin: Negative.  Negative for   rash.  Neurological: Negative.  Negative for dizziness, loss of consciousness and headaches.  Endo/Heme/Allergies: Negative.  Does not bruise/bleed easily.  All other systems reviewed and are negative.  Vitals:   02/20/18 0855  BP: 120/86  Pulse: 75  Resp: 16  Temp: 98.5 F (36.9 C)  SpO2: 95%     Physical Exam  Constitutional: She is oriented to person, place, and time. She appears well-developed and well-nourished.  HENT:  Head: Normocephalic and atraumatic.  Right Ear: External ear normal.  Left Ear: External ear normal.  Nose: Nose normal.  Mouth/Throat: Oropharynx is clear and moist.  Eyes: Pupils are equal, round, and reactive to light. Conjunctivae and EOM are normal.  Neck: Normal range of motion. No JVD present. No thyromegaly present.  Cardiovascular: Normal rate, regular rhythm, normal heart sounds and intact distal pulses.  Pulmonary/Chest: Effort normal and breath sounds normal.  Abdominal: Soft. Bowel sounds are normal. She exhibits no distension. There is no tenderness.  Musculoskeletal: Normal range of motion. She exhibits no edema or tenderness.  Lymphadenopathy:    She has no cervical adenopathy.  Neurological: She is alert and oriented to person, place, and time. No sensory deficit. She exhibits normal muscle tone. Coordination normal.  Skin: Skin is warm and dry. Capillary refill takes less than 2 seconds. No rash noted.  Psychiatric: She has a normal mood and  affect. Her behavior is normal.  Vitals reviewed.  All chronic medical problems and stable condition.  ASSESSMENT & PLAN: Isabella Dixon was seen today for medication refill.  Diagnoses and all orders for this visit:  Type 2 diabetes mellitus without complication, without long-term current use of insulin (HCC) -     CBC with Differential/Platelet -     Comprehensive metabolic panel -     Hemoglobin A1c -     metFORMIN (GLUCOPHAGE) 500 MG tablet; Take 1 tablet (500 mg total) by mouth 2 (two) times daily with a meal.  History of gout -     probenecid (BENEMID) 500 MG tablet; Take 1 tablet (500 mg total) by mouth 2 (two) times daily.  Hyperlipidemia, unspecified hyperlipidemia type -     Lipid panel -     rosuvastatin (CRESTOR) 10 MG tablet; TAKE 1 TABLET DAILY  Essential hypertension -     CBC with Differential/Platelet -     Comprehensive metabolic panel -     metoprolol tartrate (LOPRESSOR) 25 MG tablet; Take 1 tablet (25 mg total) by mouth 2 (two) times daily. -     losartan (COZAAR) 100 MG tablet; TAKE 1 TABLET DAILY  History of sciatica  Other specified hypothyroidism -     TSH -     levothyroxine (SYNTHROID, LEVOTHROID) 75 MCG tablet; Take 1 tablet (75 mcg total) by mouth daily.   Patient Instructions       IF you received an x-ray today, you will receive an invoice from [facility]. Please contact [facility] at [phone] with questions or concerns regarding your invoice.   IF you received labwork today, you will receive an invoice from [place]. Please contact [facility] at [phone] with questions or concerns regarding your invoice.   Our billing staff will not be able to assist you with questions regarding bills from these companies.  You will be contacted with the lab results as soon as they are available. The fastest way to get your results is to activate your My Chart account. Instructions are located on the last page of this paperwork. If you  have not heard from [place] regarding the results in 2 weeks, please contact this office.    Health Maintenance, Female Adopting a healthy lifestyle and getting preventive care can go a long way to promote health and wellness. Talk with your health care provider about what schedule of regular examinations is right for you. This is a good chance for you to check in with your provider about disease prevention and staying healthy. In between checkups, there are plenty of things you can do on your own. Experts have done a lot of research about which lifestyle changes and preventive measures are most likely to keep you healthy. Ask your health care provider for more information. Weight and diet Eat a healthy diet  Be sure to include plenty of vegetables, fruits, low-fat dairy products, and lean protein.  Do not eat a lot of foods high in solid fats, added sugars, or salt.  Get regular exercise. This is one of the most important things you can do for your health. ? Most adults should exercise for at least 150 minutes each week. The exercise should increase your heart rate and make you sweat (moderate-intensity exercise). ? Most adults should also do strengthening exercises at least twice a week. This is in addition to the moderate-intensity exercise.  Maintain a healthy weight  Body mass index (BMI) is a measurement that can be used to identify possible weight problems. It estimates body fat based on height and weight. Your health care provider can help determine your BMI and help you achieve or maintain a healthy weight.  For females 55 years of age and older: ? A BMI below 18.5 is considered underweight. ? A BMI of 18.5 to 24.9 is normal. ? A BMI of 25 to 29.9 is considered overweight. ? A BMI of 30 and above is considered obese.  Watch levels of cholesterol and blood lipids  You should start having your blood tested for lipids and cholesterol at 71 years of age, then have this test every 5  years.  You may need to have your cholesterol levels checked more often if: ? Your lipid or cholesterol levels are high. ? You are older than 71 years of age. ? You are at high risk for heart disease.  Cancer screening Lung Cancer  Lung cancer screening is recommended for adults 21-74 years old who are at high risk for lung cancer because of a history of smoking.  A yearly low-dose CT scan of the lungs is recommended for people who: ? Currently smoke. ? Have quit within the past 15 years. ? Have at least a 30-pack-year history of smoking. A pack year is smoking an average of one pack of cigarettes a day for 1 year.  Yearly screening should continue until it has been 15 years since you quit.  Yearly screening should stop if you develop a health problem that would prevent you from having lung cancer treatment.  Breast Cancer  Practice breast self-awareness. This means understanding how your breasts normally appear and feel.  It also means doing regular breast self-exams. Let your health care provider know about any changes, no matter how small.  If you are in your 20s or 30s, you should have a clinical breast exam (CBE) by a health care provider every 1-3 years as part of a regular health exam.  If you are 43 or older, have a CBE every year. Also consider having a breast X-ray (mammogram) every year.  If you have a family history of  breast cancer, talk to your health care provider about genetic screening.  If you are at high risk for breast cancer, talk to your health care provider about having an MRI and a mammogram every year.  Breast cancer gene (BRCA) assessment is recommended for women who have family members with BRCA-related cancers. BRCA-related cancers include: ? Breast. ? Ovarian. ? Tubal. ? Peritoneal cancers.  Results of the assessment will determine the need for genetic counseling and BRCA1 and BRCA2 testing.  Cervical Cancer Your health care provider may  recommend that you be screened regularly for cancer of the pelvic organs (ovaries, uterus, and vagina). This screening involves a pelvic examination, including checking for microscopic changes to the surface of your cervix (Pap test). You may be encouraged to have this screening done every 3 years, beginning at age 21.  For women ages 30-65, health care providers may recommend pelvic exams and Pap testing every 3 years, or they may recommend the Pap and pelvic exam, combined with testing for human papilloma virus (HPV), every 5 years. Some types of HPV increase your risk of cervical cancer. Testing for HPV may also be done on women of any age with unclear Pap test results.  Other health care providers may not recommend any screening for nonpregnant women who are considered low risk for pelvic cancer and who do not have symptoms. Ask your health care provider if a screening pelvic exam is right for you.  If you have had past treatment for cervical cancer or a condition that could lead to cancer, you need Pap tests and screening for cancer for at least 20 years after your treatment. If Pap tests have been discontinued, your risk factors (such as having a new sexual partner) need to be reassessed to determine if screening should resume. Some women have medical problems that increase the chance of getting cervical cancer. In these cases, your health care provider may recommend more frequent screening and Pap tests.  Colorectal Cancer  This type of cancer can be detected and often prevented.  Routine colorectal cancer screening usually begins at 71 years of age and continues through 71 years of age.  Your health care provider may recommend screening at an earlier age if you have risk factors for colon cancer.  Your health care provider may also recommend using home test kits to check for hidden blood in the stool.  A small camera at the end of a tube can be used to examine your colon directly  (sigmoidoscopy or colonoscopy). This is done to check for the earliest forms of colorectal cancer.  Routine screening usually begins at age 50.  Direct examination of the colon should be repeated every 5-10 years through 71 years of age. However, you may need to be screened more often if early forms of precancerous polyps or small growths are found.  Skin Cancer  Check your skin from head to toe regularly.  Tell your health care provider about any new moles or changes in moles, especially if there is a change in a mole's shape or color.  Also tell your health care provider if you have a mole that is larger than the size of a pencil eraser.  Always use sunscreen. Apply sunscreen liberally and repeatedly throughout the day.  Protect yourself by wearing long sleeves, pants, a wide-brimmed hat, and sunglasses whenever you are outside.  Heart disease, diabetes, and high blood pressure  High blood pressure causes heart disease and increases the risk of stroke. High blood   pressure is more likely to develop in: ? People who have blood pressure in the high end of the normal range (130-139/85-89 mm Hg). ? People who are overweight or obese. ? People who are African American.  If you are 60-67 years of age, have your blood pressure checked every 3-5 years. If you are 95 years of age or older, have your blood pressure checked every year. You should have your blood pressure measured twice-once when you are at a hospital or clinic, and once when you are not at a hospital or clinic. Record the average of the two measurements. To check your blood pressure when you are not at a hospital or clinic, you can use: ? An automated blood pressure machine at a pharmacy. ? A home blood pressure monitor.  If you are between 59 years and 32 years old, ask your health care provider if you should take aspirin to prevent strokes.  Have regular diabetes screenings. This involves taking a blood sample to check your  fasting blood sugar level. ? If you are at a normal weight and have a low risk for diabetes, have this test once every three years after 71 years of age. ? If you are overweight and have a high risk for diabetes, consider being tested at a younger age or more often. Preventing infection Hepatitis B  If you have a higher risk for hepatitis B, you should be screened for this virus. You are considered at high risk for hepatitis B if: ? You were born in a country where hepatitis B is common. Ask your health care provider which countries are considered high risk. ? Your parents were born in a high-risk country, and you have not been immunized against hepatitis B (hepatitis B vaccine). ? You have HIV or AIDS. ? You use needles to inject street drugs. ? You live with someone who has hepatitis B. ? You have had sex with someone who has hepatitis B. ? You get hemodialysis treatment. ? You take certain medicines for conditions, including cancer, organ transplantation, and autoimmune conditions.  Hepatitis C  Blood testing is recommended for: ? Everyone born from [birth year] through 1964. ? Anyone with known risk factors for hepatitis C.  Sexually transmitted infections (STIs)  You should be screened for sexually transmitted infections (STIs) including gonorrhea and chlamydia if: ? You are sexually active and are younger than 70 years of age. ? You are older than 71 years of age and your health care provider tells you that you are at risk for this type of infection. ? Your sexual activity has changed since you were last screened and you are at an increased risk for chlamydia or gonorrhea. Ask your health care provider if you are at risk.  If you do not have HIV, but are at risk, it may be recommended that you take a prescription medicine daily to prevent HIV infection. This is called pre-exposure prophylaxis (PrEP). You are considered at risk if: ? You are sexually active and do not regularly use condoms  or know the HIV status of your partner(s). ? You take drugs by injection. ? You are sexually active with a partner who has HIV.  Talk with your health care provider about whether you are at high risk of being infected with HIV. If you choose to begin PrEP, you should first be tested for HIV. You should then be tested every 3 months for as long as you are taking PrEP. Pregnancy  If you are premenopausal  and you may become pregnant, ask your health care provider about preconception counseling.  If you may become pregnant, take 400 to 800 micrograms (mcg) of folic acid every day.  If you want to prevent pregnancy, talk to your health care provider about birth control (contraception). Osteoporosis and menopause  Osteoporosis is a disease in which the bones lose minerals and strength with aging. This can result in serious bone fractures. Your risk for osteoporosis can be identified using a bone density scan.  If you are 26 years of age or older, or if you are at risk for osteoporosis and fractures, ask your health care provider if you should be screened.  Ask your health care provider whether you should take a calcium or vitamin D supplement to lower your risk for osteoporosis.  Menopause may have certain physical symptoms and risks.  Hormone replacement therapy may reduce some of these symptoms and risks. Talk to your health care provider about whether hormone replacement therapy is right for you. Follow these instructions at home:  Schedule regular health, dental, and eye exams.  Stay current with your immunizations.  Do not use any tobacco products including cigarettes, chewing tobacco, or electronic cigarettes.  If you are pregnant, do not drink alcohol.  If you are breastfeeding, limit how much and how often you drink alcohol.  Limit alcohol intake to no more than 1 drink per day for nonpregnant women. One drink equals 12 ounces of beer, 5 ounces of wine, or 1 ounces of hard  liquor.  Do not use street drugs.  Do not share needles.  Ask your health care provider for help if you need support or information about quitting drugs.  Tell your health care provider if you often feel depressed.  Tell your health care provider if you have ever been abused or do not feel safe at home. This information is not intended to replace advice given to you by your health care provider. Make sure you discuss any questions you have with your health care provider. Document Released: 02/22/2011 Document Revised: 01/15/2016 Document Reviewed: 05/13/2015 Elsevier Interactive Patient Education  2018 Elsevier Inc.      Agustina Caroli, MD [facility]

## 2018-02-21 LAB — COMPREHENSIVE METABOLIC PANEL
ALBUMIN: 4.7 g/dL (ref 3.5–4.8)
ALK PHOS: 89 IU/L (ref 39–117)
ALT: 18 IU/L (ref 0–32)
AST: 23 IU/L (ref 0–40)
Albumin/Globulin Ratio: 1.3 (ref 1.2–2.2)
BUN / CREAT RATIO: 28 (ref 12–28)
BUN: 24 mg/dL (ref 8–27)
Bilirubin Total: 0.5 mg/dL (ref 0.0–1.2)
CO2: 24 mmol/L (ref 20–29)
CREATININE: 0.86 mg/dL (ref 0.57–1.00)
Calcium: 10.4 mg/dL — ABNORMAL HIGH (ref 8.7–10.3)
Chloride: 99 mmol/L (ref 96–106)
GFR calc non Af Amer: 68 mL/min/{1.73_m2} (ref >59)
GFR, EST AFRICAN AMERICAN: 79 mL/min/{1.73_m2} (ref >59)
GLOBULIN, TOTAL: 3.6 g/dL (ref 1.5–4.5)
Glucose: 112 mg/dL — ABNORMAL HIGH (ref 65–99)
Potassium: 4.5 mmol/L (ref 3.5–5.2)
SODIUM: 140 mmol/L (ref 134–144)
TOTAL PROTEIN: 8.3 g/dL (ref 6.0–8.5)

## 2018-02-21 LAB — CBC WITH DIFFERENTIAL/PLATELET
Basophils Absolute: 0 10*3/uL (ref 0.0–0.2)
Basos: 1 %
EOS (ABSOLUTE): 0.1 10*3/uL (ref 0.0–0.4)
EOS: 3 %
HEMATOCRIT: 41.2 % (ref 34.0–46.6)
Hemoglobin: 14.1 g/dL (ref 11.1–15.9)
IMMATURE GRANS (ABS): 0 10*3/uL (ref 0.0–0.1)
Immature Granulocytes: 0 %
LYMPHS ABS: 2.4 10*3/uL (ref 0.7–3.1)
Lymphs: 42 %
MCH: 30.1 pg (ref 26.6–33.0)
MCHC: 34.2 g/dL (ref 31.5–35.7)
MCV: 88 fL (ref 79–97)
MONOCYTES: 12 %
Monocytes Absolute: 0.7 10*3/uL (ref 0.1–0.9)
NEUTROS ABS: 2.4 10*3/uL (ref 1.4–7.0)
Neutrophils: 42 %
Platelets: 251 10*3/uL (ref 150–450)
RBC: 4.68 x10E6/uL (ref 3.77–5.28)
RDW: 15 % (ref 12.3–15.4)
WBC: 5.6 10*3/uL (ref 3.4–10.8)

## 2018-02-21 LAB — LIPID PANEL
CHOL/HDL RATIO: 3.8 ratio (ref 0.0–4.4)
Cholesterol, Total: 139 mg/dL (ref 100–199)
HDL: 37 mg/dL — AB (ref >39)
LDL CALC: 57 mg/dL (ref 0–99)
TRIGLYCERIDES: 226 mg/dL — AB (ref 0–149)
VLDL CHOLESTEROL CAL: 45 mg/dL — AB (ref 5–40)

## 2018-02-21 LAB — TSH: TSH: 1.78 u[IU]/mL (ref 0.450–4.500)

## 2018-02-21 LAB — HEMOGLOBIN A1C
Est. average glucose Bld gHb Est-mCnc: 123 mg/dL
Hgb A1c MFr Bld: 5.9 % — ABNORMAL HIGH (ref 4.8–5.6)

## 2018-03-10 ENCOUNTER — Other Ambulatory Visit: Payer: Self-pay | Admitting: Emergency Medicine

## 2018-03-10 DIAGNOSIS — I10 Essential (primary) hypertension: Secondary | ICD-10-CM

## 2018-03-10 NOTE — Telephone Encounter (Signed)
metoprolol refill Last Refill:02/20/18 # 180 3 RF Last OV: 02/20/18 PCP: Edwina BarthMiguel Sagardia MD Pharmacy:[facility] [phone]

## 2018-03-27 LAB — HM DIABETES EYE EXAM

## 2018-04-01 ENCOUNTER — Encounter: Payer: Self-pay | Admitting: *Deleted

## 2018-04-06 ENCOUNTER — Other Ambulatory Visit: Payer: Self-pay | Admitting: Emergency Medicine

## 2018-04-06 DIAGNOSIS — I10 Essential (primary) hypertension: Secondary | ICD-10-CM

## 2018-05-23 ENCOUNTER — Telehealth: Payer: Self-pay | Admitting: Emergency Medicine

## 2018-05-23 NOTE — Telephone Encounter (Signed)
Called pt. To reschedule appt. With Dr. Alvy Bimler on 08/25/17. Left Detailed Vm. If pt. Calls back please reschedule

## 2018-06-12 NOTE — Telephone Encounter (Signed)
Called patient to set up appoinment for Thursday at 10.24.2019 .+

## 2018-06-15 ENCOUNTER — Encounter: Payer: Self-pay | Admitting: Emergency Medicine

## 2018-06-15 ENCOUNTER — Other Ambulatory Visit: Payer: Self-pay

## 2018-06-15 ENCOUNTER — Ambulatory Visit (INDEPENDENT_AMBULATORY_CARE_PROVIDER_SITE_OTHER): Payer: Medicare Other | Admitting: Emergency Medicine

## 2018-06-15 VITALS — BP 128/81 | HR 68 | Temp 98.0°F | Resp 16 | Ht 62.25 in | Wt 184.6 lb

## 2018-06-15 DIAGNOSIS — Z23 Encounter for immunization: Secondary | ICD-10-CM

## 2018-06-15 DIAGNOSIS — E119 Type 2 diabetes mellitus without complications: Secondary | ICD-10-CM | POA: Diagnosis not present

## 2018-06-15 DIAGNOSIS — M109 Gout, unspecified: Secondary | ICD-10-CM

## 2018-06-15 DIAGNOSIS — M25571 Pain in right ankle and joints of right foot: Secondary | ICD-10-CM | POA: Diagnosis not present

## 2018-06-15 MED ORDER — TRAMADOL HCL 50 MG PO TABS: 50.0000 mg | ORAL_TABLET | Freq: Three times a day (TID) | ORAL | 0 refills | Status: DC | PRN

## 2018-06-15 NOTE — Addendum Note (Signed)
Addended by: Evie Lacks on: 06/15/2018 05:08 PM   Modules accepted: Orders

## 2018-06-15 NOTE — Patient Instructions (Addendum)
   If you have lab work done today you will be contacted with your lab results within the next 2 weeks.  If you have not heard from us then please contact us. The fastest way to get your results is to register for My Chart.   IF you received an x-ray today, you will receive an invoice from [facility]. Please contact [facility] at [phone] with questions or concerns regarding your invoice.   IF you received labwork today, you will receive an invoice from [facility]. Please contact [facility] at [phone] with questions or concerns regarding your invoice.   Our billing staff will not be able to assist you with questions regarding bills from these companies.  You will be contacted with the lab results as soon as they are available. The fastest way to get your results is to activate your My Chart account. Instructions are located on the last page of this paperwork. If you have not heard from us regarding the results in 2 weeks, please contact this office.     Gout Gout is painful swelling that can happen in some of your joints. Gout is a type of arthritis. This condition is caused by having too much uric acid in your body. Uric acid is a chemical that is made when your body breaks down substances called purines. If your body has too much uric acid, sharp crystals can form and build up in your joints. This causes pain and swelling. Gout attacks can happen quickly and be very painful (acute gout). Over time, the attacks can affect more joints and happen more often (chronic gout). Follow these instructions at home: During a Gout Attack  If directed, put ice on the painful area: ? Put ice in a plastic bag. ? Place a towel between your skin and the bag. ? Leave the ice on for 20 minutes, 2-3 times a day.  Rest the joint as much as possible. If the joint is in your leg, you may be given crutches to use.  Raise (elevate) the painful joint above the level of your heart as  often as you can.  Drink enough fluids to keep your pee (urine) clear or pale yellow.  Take over-the-counter and prescription medicines only as told by your doctor.  Do not drive or use heavy machinery while taking prescription pain medicine.  Follow instructions from your doctor about what you can or cannot eat and drink.  Return to your normal activities as told by your doctor. Ask your doctor what activities are safe for you. Avoiding Future Gout Attacks  Follow a low-purine diet as told by a specialist (dietitian) or your doctor. Avoid foods and drinks that have a lot of purines, such as: ? Liver. ? Kidney. ? Anchovies. ? Asparagus. ? Herring. ? Mushrooms ? Mussels. ? Beer.  Limit alcohol intake to no more than 1 drink a day for nonpregnant women and 2 drinks a day for men. One drink equals 12 oz of beer, 5 oz of wine, or 1 oz of hard liquor.  Stay at a healthy weight or lose weight if you are overweight. If you want to lose weight, talk with your doctor. It is important that you do not lose weight too fast.  Start or continue an exercise plan as told by your doctor.  Drink enough fluids to keep your pee clear or pale yellow.  Take over-the-counter and prescription medicines only as told by your doctor.  Keep all follow-up visits as told   by your doctor. This is important. Contact a doctor if:  You have another gout attack.  You still have symptoms of a gout attack after10 days of treatment.  You have problems (side effects) because of your medicines.  You have chills or a fever.  You have burning pain when you pee (urinate).  You have pain in your lower back or belly. Get help right away if:  You have very bad pain.  Your pain cannot be controlled.  You cannot pee. This information is not intended to replace advice given to you by your health care provider. Make sure you discuss any questions you have with your health care provider. Document Released:  05/18/2008 Document Revised: 01/15/2016 Document Reviewed: 05/22/2015 Elsevier Interactive Patient Education  2018 Elsevier Inc.  

## 2018-06-15 NOTE — Addendum Note (Signed)
Addended by: Georg Ruddle A on: 06/15/2018 05:08 PM   Modules accepted: Orders

## 2018-06-15 NOTE — Progress Notes (Signed)
Isabella Dixon 72 y.o.   Chief Complaint  Patient presents with  . Gout    RIGHT ANKLE  and FOOT    HISTORY OF PRESENT ILLNESS: This is a 72 y.o. female with a history of gout complaining of pain to her right ankle and foot that started several days ago.  Went to urgent care center 2 days ago and was started on prednisone.  Needs something for pain.  Denies any injuries or any other significant symptoms.  HPI   Prior to Admission medications   Medication Sig Start Date End Date Taking? Authorizing Provider  chlorpheniramine (CHLOR-TRIMETON) 4 MG tablet Take 4 mg by mouth 2 (two) times daily as needed for allergies.   Yes [provider]  Cholecalciferol (VITAMIN D-3 PO) Take 50 mcg by mouth daily.   Yes [provider]  gabapentin (NEURONTIN) 100 MG capsule Take 2 capsules at bedtime. 09/06/17  Yes Georgina Quint, MD  losartan (COZAAR) 100 MG tablet TAKE 1 TABLET DAILY 02/20/18  Yes Georgina Quint, MD  Magnesium 250 MG TABS Take by mouth daily.   Yes [provider]  Multiple Vitamin (MULTIVITAMIN) tablet Take 1 tablet by mouth daily.   Yes [provider]  Naproxen Sodium (ALEVE PO) Take by mouth.   Yes [provider]  OVER THE COUNTER MEDICATION OTC Vit D 1400 mg daily   Yes [provider]  predniSONE (DELTASONE) 10 MG tablet Take 10 mg by mouth daily with breakfast.   Yes [provider]  vitamin C (ASCORBIC ACID) 250 MG tablet Take 250 mg by mouth daily.   Yes [provider]  aspirin 81 MG tablet Aspirin Low Dose 81 mg tablet,delayed release  Take 1 tablet every day by oral route.    [provider]  Choline Fenofibrate (FENOFIBRIC ACID) 135 MG CPDR Take 135 mg by mouth daily. Patient not taking: Reported on 10/10/2017 11/21/15   Collene Gobble, MD  levothyroxine (SYNTHROID, LEVOTHROID) 75 MCG tablet Take 1 tablet (75 mcg total) by mouth daily. 02/20/18 05/21/18  Georgina Quint,  MD  metFORMIN (GLUCOPHAGE) 500 MG tablet Take 1 tablet (500 mg total) by mouth 2 (two) times daily with a meal. 02/20/18 05/21/18  Jett Kulzer, Eilleen Kempf, MD  metoprolol tartrate (LOPRESSOR) 25 MG tablet Take 1 tablet (25 mg total) by mouth 2 (two) times daily. 02/20/18 05/21/18  Georgina Quint, MD  metoprolol tartrate (LOPRESSOR) 25 MG tablet TAKE 1 TABLET BY MOUTH TWICE A DAY 03/10/18   Georgina Quint, MD  probenecid (BENEMID) 500 MG tablet Take 1 tablet (500 mg total) by mouth 2 (two) times daily. 02/20/18 05/21/18  Georgina Quint, MD  rosuvastatin (CRESTOR) 10 MG tablet TAKE 1 TABLET DAILY Patient not taking: Reported on 06/15/2018 02/20/18   Georgina Quint, MD    No Known Allergies  Patient Active Problem List   Diagnosis Date Noted  . History of sciatica 09/06/2017  . Type 2 diabetes mellitus without complication, without long-term current use of insulin (HCC) 09/06/2017  . History of gout 09/06/2017  . Diabetes mellitus type 2 in obese (HCC) 05/13/2015  . Hypothyroid 07/25/2012  . Gout 05/02/2012  . Osteoarthritis 05/02/2012  . Hypertension 05/02/2012  . Hyperlipidemia 05/02/2012  . Abnormal pap 05/02/2012  . Hyperglycemia 05/02/2012    Past Medical History:  Diagnosis Date  . Anemia   . Arthritis   . Diabetes mellitus without complication (HCC)   . Gout   . High  cholesterol   . Hypertension   . Osteoporosis     Past Surgical History:  Procedure Laterality Date  . ABDOMINAL HYSTERECTOMY    . TUBAL LIGATION      Social History   Socioeconomic History  . Marital status: Married    Spouse name: Not on file  . Number of children: Not on file  . Years of education: Not on file  . Highest education level: Not on file  Occupational History  . Not on file  Social Needs  . Financial resource strain: Not on file  . Food insecurity:    Worry: Not on file    Inability: Not on file  . Transportation needs:    Medical: Not on file    Non-medical:  Not on file  Tobacco Use  . Smoking status: Never Smoker  . Smokeless tobacco: Never Used  Substance and Sexual Activity  . Alcohol use: No  . Drug use: No  . Sexual activity: Yes  Lifestyle  . Physical activity:    Days per week: Not on file    Minutes per session: Not on file  . Stress: Not on file  Relationships  . Social connections:    Talks on phone: Not on file    Gets together: Not on file    Attends religious service: Not on file    Active member of club or organization: Not on file    Attends meetings of clubs or organizations: Not on file    Relationship status: Not on file  . Intimate partner violence:    Fear of current or ex partner: Not on file    Emotionally abused: Not on file    Physically abused: Not on file    Forced sexual activity: Not on file  Other Topics Concern  . Not on file  Social History Narrative  . Not on file    Family History  Problem Relation Age of Onset  . Heart disease Mother   . Stroke Mother   . Hypertension Mother   . Stroke Father   . Cancer Sister   . Cancer Son   . Stroke Paternal Grandmother   . Cancer Paternal Grandmother   . Stroke Maternal Grandmother   . Cancer Paternal Grandfather      Review of Systems  Constitutional: Negative.  Negative for chills and fever.  HENT: Negative.  Negative for sore throat.   Respiratory: Negative.  Negative for cough and shortness of breath.   Cardiovascular: Negative.  Negative for chest pain and palpitations.  Gastrointestinal: Negative.  Negative for abdominal pain, diarrhea, nausea and vomiting.  Musculoskeletal: Positive for back pain (Chronic sciatica pain) and joint pain (Right ankle).  Skin: Negative.  Negative for rash.  Neurological: Negative.  Negative for dizziness and headaches.  Endo/Heme/Allergies: Negative.   All other systems reviewed and are negative.   Vitals:   06/15/18 1604  BP: 128/81  Pulse: 68  Resp: 16  Temp: 98 F (36.7 C)  SpO2: 96%     Physical Exam  Constitutional: She is oriented to person, place, and time. She appears well-developed and well-nourished.  HENT:  Head: Normocephalic and atraumatic.  Eyes: Pupils are equal, round, and reactive to light. EOM are normal.  Neck: Normal range of motion. Neck supple.  Cardiovascular: Normal rate and regular rhythm.  Pulmonary/Chest: Effort normal and breath sounds normal.  Musculoskeletal:  Right ankle: Mild erythema and swelling with tenderness to lateral ankle area.  Full range of motion but  complaining of pain.  Neurological: She is alert and oriented to person, place, and time. No sensory deficit. She exhibits normal muscle tone. Coordination normal.  Skin: Skin is warm and dry. Capillary refill takes less than 2 seconds.  Psychiatric: She has a normal mood and affect. Her behavior is normal.  Vitals reviewed.  A total of 25 minutes was spent in the room with the patient, greater than 50% of which was in counseling/coordination of care regarding differential diagnosis, treatment, medications, prognosis, and need for follow-up if no better or worse.   ASSESSMENT & PLAN:  Isabella Dixon was seen today for gout.  Diagnoses and all orders for this visit:  Gouty arthritis  Need for prophylactic vaccination and inoculation against influenza -     Flu vaccine HIGH DOSE PF  Pain in joint involving right ankle and foot  Other orders -     traMADol (ULTRAM) 50 MG tablet; Take 1 tablet (50 mg total) by mouth every 8 (eight) hours as needed.    Patient Instructions       If you have lab work done today you will be contacted with your lab results within the next 2 weeks.  If you have not heard from [place] then please contact us. The fastest way to get your results is to register for My Chart.   IF you received an x-ray today, you will receive an invoice from [facility]. Please contact [facility] at [phone] with questions or concerns regarding your  invoice.   IF you received labwork today, you will receive an invoice from [facility]. Please contact [facility] at [phone] with questions or concerns regarding your invoice.   Our billing staff will not be able to assist you with questions regarding bills from these companies.  You will be contacted with the lab results as soon as they are available. The fastest way to get your results is to activate your My Chart account. Instructions are located on the last page of this paperwork. If you have not heard from [place] regarding the results in 2 weeks, please contact this office.     Gout Gout is painful swelling that can happen in some of your joints. Gout is a type of arthritis. This condition is caused by having too much uric acid in your body. Uric acid is a chemical that is made when your body breaks down substances called purines. If your body has too much uric acid, sharp crystals can form and build up in your joints. This causes pain and swelling. Gout attacks can happen quickly and be very painful (acute gout). Over time, the attacks can affect more joints and happen more often (chronic gout). Follow these instructions at home: During a Gout Attack  If directed, put ice on the painful area: ? Put ice in a plastic bag. ? Place a towel between your skin and the bag. ? Leave the ice on for 20 minutes, 2-3 times a day.  Rest the joint as much as possible. If the joint is in your leg, you may be given crutches to use.  Raise (elevate) the painful joint above the level of your heart as often as you can.  Drink enough fluids to keep your pee (urine) clear or pale yellow.  Take over-the-counter and prescription medicines only as told by your doctor.  Do not drive or use heavy machinery while taking prescription pain medicine.  Follow instructions from your doctor about what you can or cannot eat and drink.  Return  to your normal activities as told by your doctor. Ask your doctor what  activities are safe for you. Avoiding Future Gout Attacks  Follow a low-purine diet as told by a specialist (dietitian) or your doctor. Avoid foods and drinks that have a lot of purines, such as: ? Liver. ? Kidney. ? Anchovies. ? Asparagus. ? Herring. ? Mushrooms ? Mussels. ? Beer.  Limit alcohol intake to no more than 1 drink a day for nonpregnant women and 2 drinks a day for men. One drink equals 12 oz of beer, 5 oz of wine, or 1 oz of hard liquor.  Stay at a healthy weight or lose weight if you are overweight. If you want to lose weight, talk with your doctor. It is important that you do not lose weight too fast.  Start or continue an exercise plan as told by your doctor.  Drink enough fluids to keep your pee clear or pale yellow.  Take over-the-counter and prescription medicines only as told by your doctor.  Keep all follow-up visits as told by your doctor. This is important. Contact a doctor if:  You have another gout attack.  You still have symptoms of a gout attack after10 days of treatment.  You have problems (side effects) because of your medicines.  You have chills or a fever.  You have burning pain when you pee (urinate).  You have pain in your lower back or belly. Get help right away if:  You have very bad pain.  Your pain cannot be controlled.  You cannot pee. This information is not intended to replace advice given to you by your health care provider. Make sure you discuss any questions you have with your health care provider. Document Released: 05/18/2008 Document Revised: 01/15/2016 Document Reviewed: 05/22/2015 Elsevier Interactive Patient Education  2018 Elsevier Inc.     Edwina Barth, MD [facility]

## 2018-06-16 ENCOUNTER — Ambulatory Visit: Payer: Medicare Other | Admitting: Emergency Medicine

## 2018-08-11 ENCOUNTER — Other Ambulatory Visit: Payer: Self-pay | Admitting: Emergency Medicine

## 2018-08-11 DIAGNOSIS — Z8739 Personal history of other diseases of the musculoskeletal system and connective tissue: Secondary | ICD-10-CM

## 2018-08-11 MED ORDER — PROBENECID 500 MG PO TABS: 500.0000 mg | ORAL_TABLET | Freq: Two times a day (BID) | ORAL | 3 refills | Status: DC

## 2018-08-11 NOTE — Telephone Encounter (Signed)
Copied from CRM [id number]. Topic: Quick Communication - Rx Refill/Question >> Aug 11, 2018  9:05 AM Gloriann LoanPayne, Ersie Savino L wrote: Medication: probenecid (BENEMID) 500 MG tablet 4 tabs a day instead of 3 times a day  pt has 2 days left    Has the patient contacted their pharmacy? Yes.  To the mail service but she want to use a local pharmacy (Agent: If no, request that the patient contact the pharmacy for the refill.) (Agent: If yes, when and what did the pharmacy advise?) called today  Preferred Pharmacy (with phone number or street name): [id number] [place] - [address] [place] [address] Phone: [phone] Fax: [phone] Not a 24 hour pharmacy; exact hours not known.    Agent: Please be advised that RX refills may take up to 3 business days. We ask that you follow-up with your pharmacy.

## 2018-08-11 NOTE — Telephone Encounter (Signed)
Requested Prescriptions  Pending Prescriptions Disp Refills  . probenecid (BENEMID) 500 MG tablet 180 tablet 3    Sig: Take 1 tablet (500 mg total) by mouth 2 (two) times daily.     Endocrinology: Gout Agents - febuxostat & probenecid Failed - 08/11/2018 10:10 AM      Failed - Uric Acid in normal range and within 360 days    Uric Acid, Serum  Date Value Ref Range Status  05/13/2015 4.3 2.4 - 7.0 mg/dL Final   Uric Acid  Date Value Ref Range Status  05/29/2012 4.2  Final         Passed - Valid encounter within last 12 months    Recent Outpatient Visits          1 month ago Gouty arthritis   [facility]   5 months ago Type 2 diabetes mellitus without complication, without long-term current use of insulin [facility](HCC)   [facility], Eilleen KempfMiguel Jose, MD   11 months ago Essential hypertension   [facility] [place], [place]   1 year ago Type 2 diabetes mellitus without complication, without long-term current use of insulin [facility](HCC)   [facility], Asencion PartridgeJeffrey R, MD   2 years ago Lumbar radiculopathy, acute   [facility], Maylon PeppersSteven A, MD

## 2018-08-25 ENCOUNTER — Ambulatory Visit: Payer: Medicare Other | Admitting: Emergency Medicine

## 2018-08-28 IMAGING — MR MR PELVIS W/O CM
4 of 7 series · 15 of 48 positions shown · non-contrast
Comparison: Pelvic x-rays dated September 16, 2017.

CLINICAL DATA: Low back pain and sciatica. Right-sided pelvic pain.

EXAM:
MRI PELVIS WITHOUT CONTRAST
TECHNIQUE: Multiplanar multisequence MR imaging of the pelvis was performed. No
intravenous contrast was administered.

[Series 3: STIR · coronal · 5.0mm · 0.66mm/px · 6 of 32 slices shown]
[im 1/32]
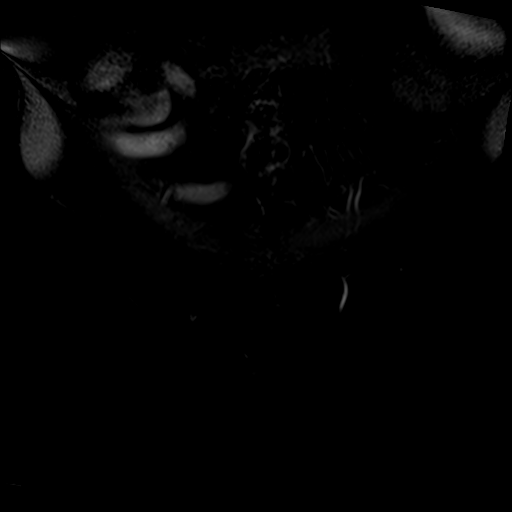
[im 7/32]
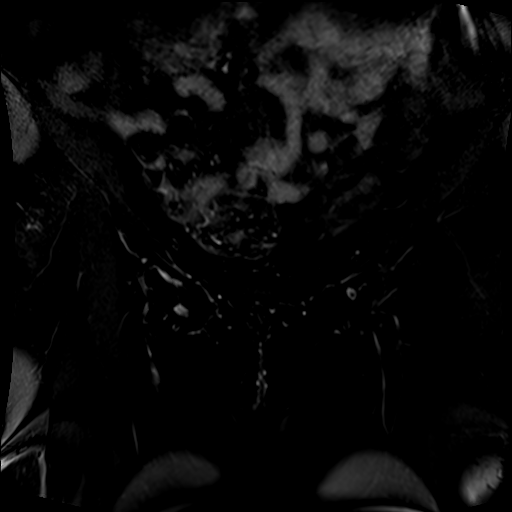
[im 13/32]
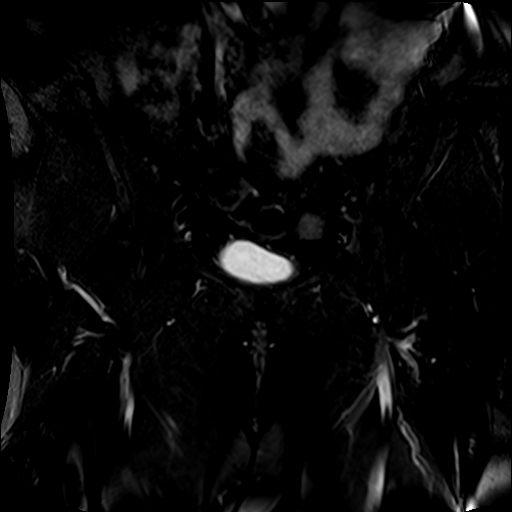
[im 19/32]
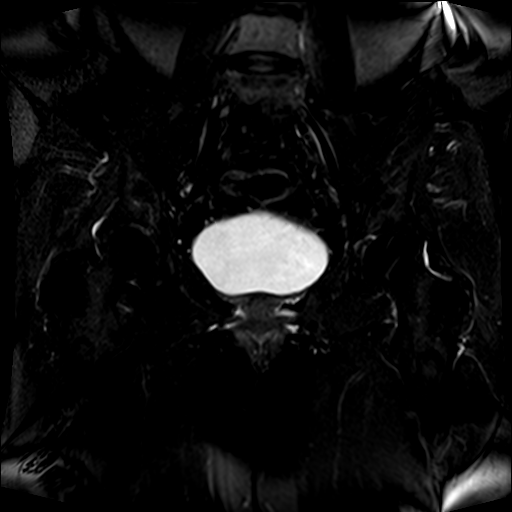
[im 25/32]
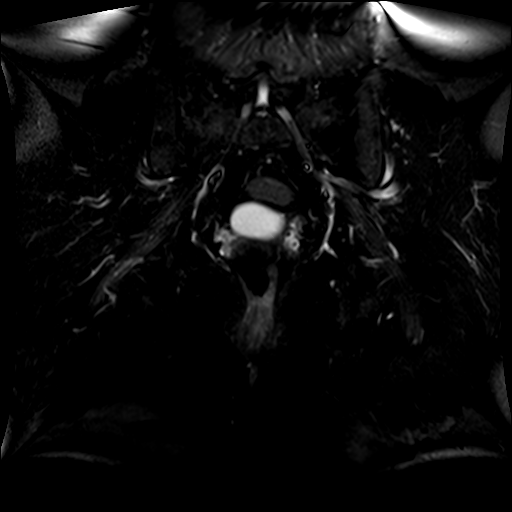
[im 32/32]
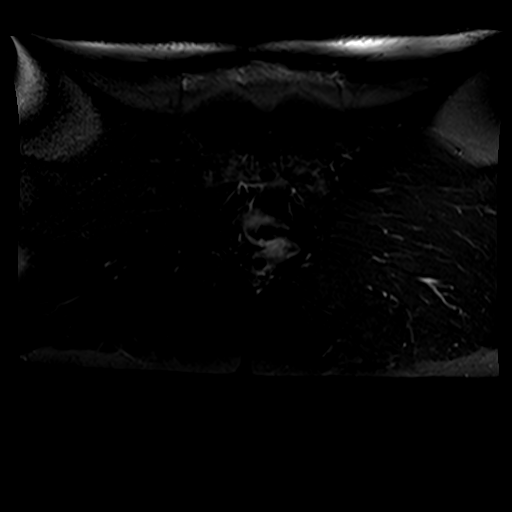

[Series 4: T2 fat-sat · sagittal · 5.0mm · 0.48mm/px · 3 of 44 slices shown (1 of 2)]
[im 8/44]
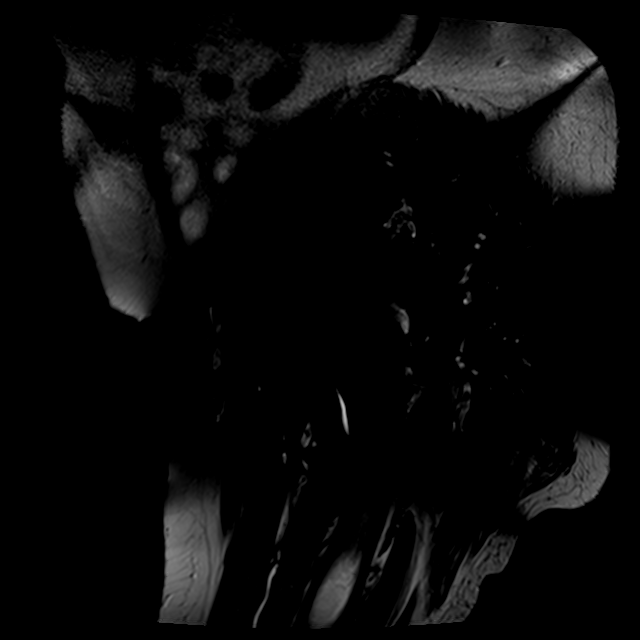
[im 22/44]
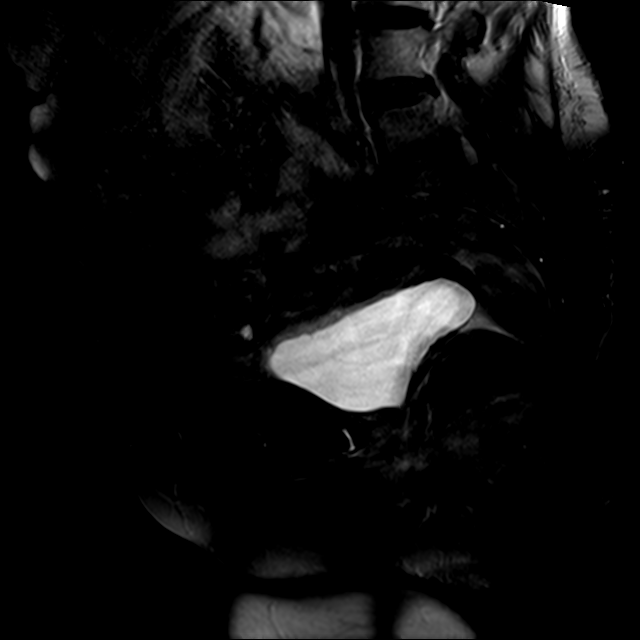
[im 36/44]
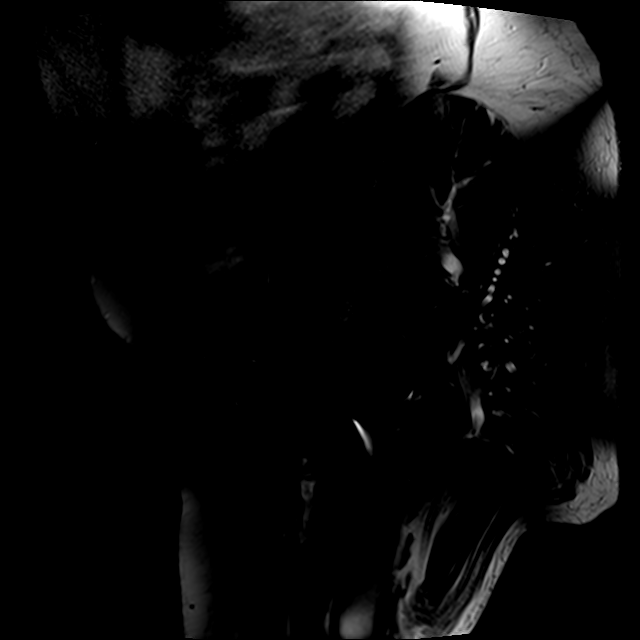

[Series 5: T1 · axial · 5.0mm · 0.48mm/px · z∈[+11,+221]mm · 3 of 50 slices shown]
[im 8/50]
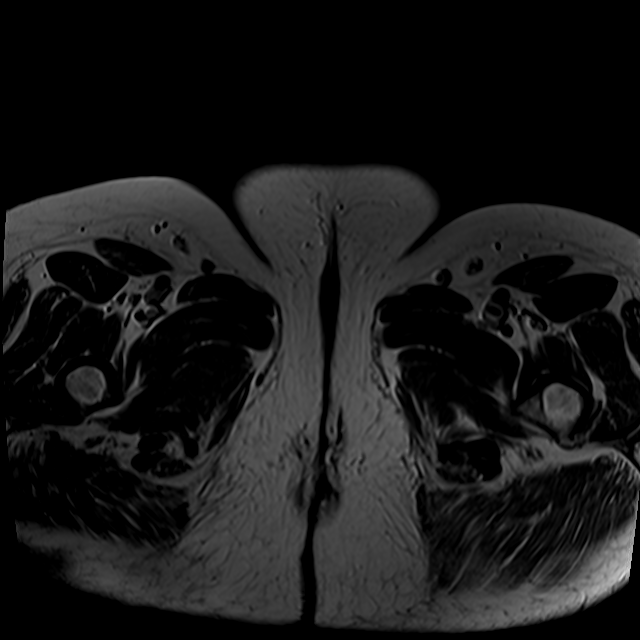
[im 29/50]
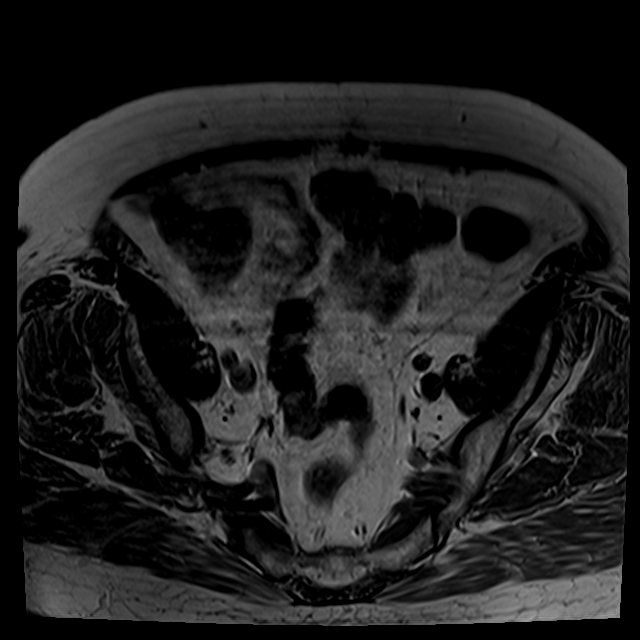
[im 43/50]
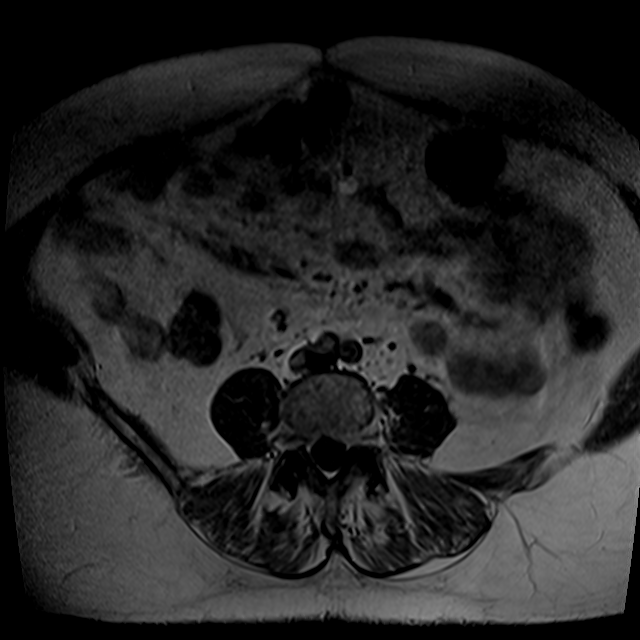

[Series 6: T2 fat-sat · axial · 5.0mm · 0.48mm/px · z∈[+11,+221]mm · 3 of 50 slices shown (2 of 2)]
[im 8/50]
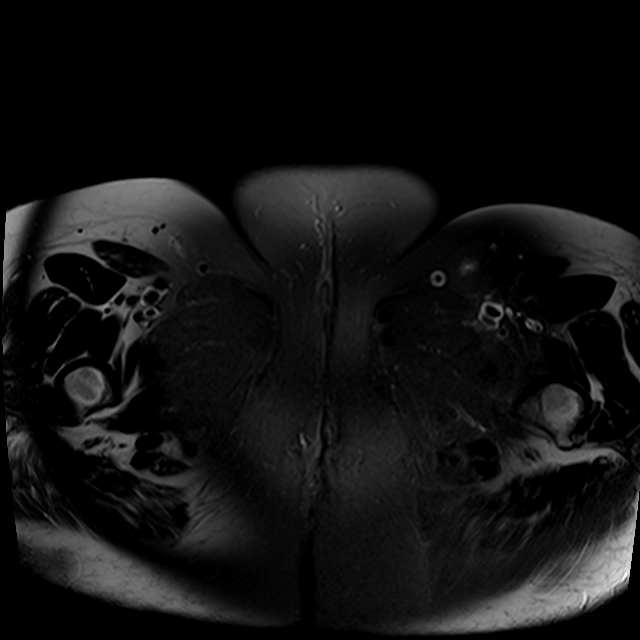
[im 29/50]
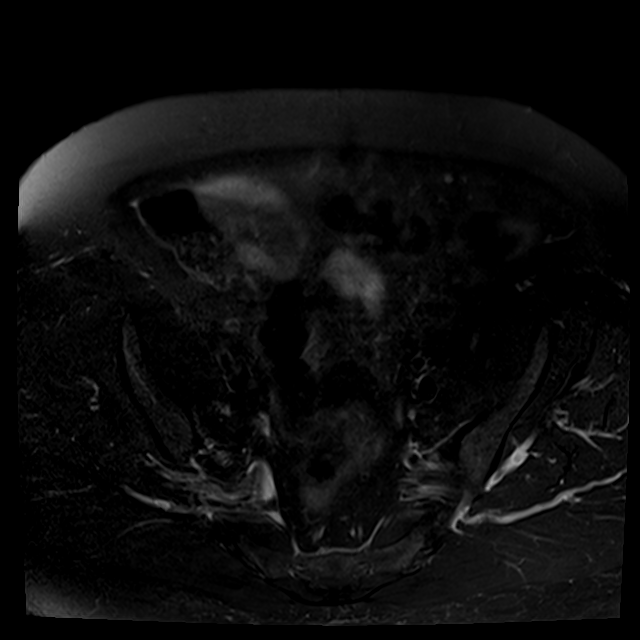
[im 43/50]
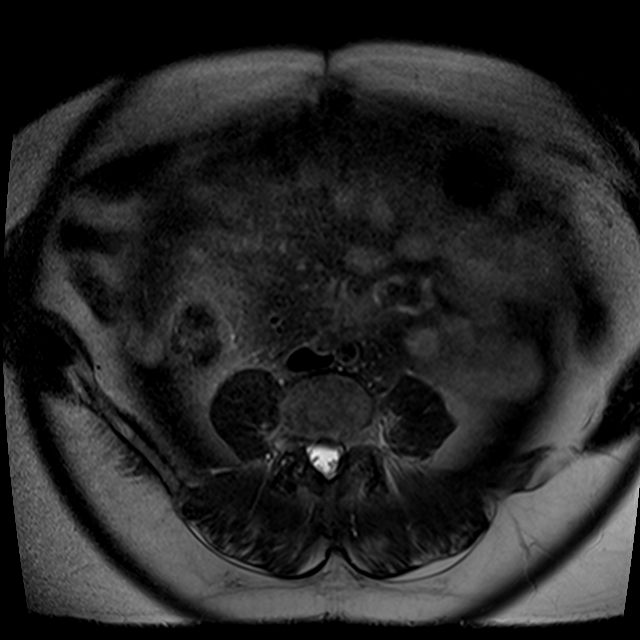

[15 of 48 positions shown; findings below may reference images not displayed]

FINDINGS: Bones: There is no evidence of acute fracture, dislocation or
avascular necrosis. The visualized bony pelvis appears normal. The
visualized sacroiliac joints and symphysis pubis appear normal.

Articular cartilage and labrum

Articular cartilage: Mild partial-thickness cartilage loss in both
hip joints, particularly along the posterior aspect. No subchondral
signal abnormality identified.

Labrum: There is no gross labral tear or paralabral abnormality.

Joint or bursal effusion

Joint effusion: No significant hip joint effusion.

Bursae: No focal periarticular fluid collection.

Muscles and tendons

Muscles and tendons: Low-grade partial tearing of the bilateral
gluteus medius and minimus tendons. Mild fatty atrophy of the left
gluteus minimus muscle. The visualized hamstring and iliopsoas
tendons appear normal. The piriformis muscles appear symmetric.

Other findings

Miscellaneous: Prior hysterectomy. The visualized internal pelvic
contents otherwise appear unremarkable.
IMPRESSION: 1. Mild osteoarthritis of both hip joints. No acute osseous
abnormality.
2. Low-grade partial tearing of the bilateral gluteus medius and
minimus tendons.

## 2018-08-31 ENCOUNTER — Other Ambulatory Visit: Payer: Self-pay | Admitting: Emergency Medicine

## 2018-08-31 DIAGNOSIS — Z8669 Personal history of other diseases of the nervous system and sense organs: Secondary | ICD-10-CM

## 2018-09-07 ENCOUNTER — Telehealth: Payer: Self-pay | Admitting: *Deleted

## 2018-09-07 NOTE — Telephone Encounter (Signed)
Left message to schedule Annual Wellness Visit subsequent.

## 2018-10-20 ENCOUNTER — Other Ambulatory Visit: Payer: Self-pay | Admitting: Emergency Medicine

## 2018-10-20 DIAGNOSIS — Z8739 Personal history of other diseases of the musculoskeletal system and connective tissue: Secondary | ICD-10-CM

## 2018-10-20 NOTE — Telephone Encounter (Signed)
Copied from CRM [id number]. Topic: Quick Communication - Rx Refill/Question >> Oct 20, 2018  1:04 PM Fanny Bien wrote: Medication: probenecid (BENEMID) 500 MG tablet [[id number]]  pt states she needs to be taking 4 a day   Has the patient contacted their pharmacy? No Preferred Pharmacy (with phone number or street name):[facility] - [place], [place] - [address] [phone] (Phone) [phone] (Fax)   Agent: Please be advised that RX refills may take up to 3 business days. We ask that you follow-up with your pharmacy.

## 2018-10-20 NOTE — Telephone Encounter (Signed)
Requested medication (s) are due for refill today: Yes  Requested medication (s) are on the active medication list: Yes  Last refill:  08/11/18  Future visit scheduled: Yes  Notes to clinic:  Requesting new Rx sent for 2 tabs BID as discussed in last OV, will run out of medication on next Wednesday.     Requested Prescriptions  Pending Prescriptions Disp Refills   probenecid (BENEMID) 500 MG tablet 180 tablet 3    Sig: Take 1 tablet (500 mg total) by mouth 2 (two) times daily.     Endocrinology: Gout Agents - febuxostat & probenecid Failed - 10/20/2018  1:20 PM      Failed - Uric Acid in normal range and within 360 days    Uric Acid, Serum  Date Value Ref Range Status  05/13/2015 4.3 2.4 - 7.0 mg/dL Final   Uric Acid  Date Value Ref Range Status  05/29/2012 4.2  Final         Passed - Valid encounter within last 12 months    Recent Outpatient Visits          4 months ago Gouty arthritis   [facility] at [place], Linton Hall, MD   8 months ago Type 2 diabetes mellitus without complication, without long-term current use of insulin [facility])   Primary Care at [facility], Eilleen Kempf, MD   1 year ago Essential hypertension   Primary Care at [place], [place], MD   1 year ago Type 2 diabetes mellitus without complication, without long-term current use of insulin [facility])   [facility] at [facility], Asencion Partridge, MD   2 years ago Lumbar radiculopathy, acute   Primary Care at [facility], Maylon Peppers, MD      Future Appointments            In 1 week Sagardia, Eilleen Kempf, MD Primary Care at [place], [facility]

## 2018-10-20 NOTE — Telephone Encounter (Signed)
Patient called and asked about her request to increase the Probenecid to 4 times a day. She says that she was taking 2 tabs BID when she was seeing a rheumatologist and that was working for her. She says when she started seeing Dr. Terence Lux, he changed it to 1 tab BID, but at the last visit, they talked and agreed to take 3 tabs. She says she is wanting a new prescription sent for 2 tabs BID, she has enough of the prescription to last until next Wednesday.

## 2018-11-01 ENCOUNTER — Encounter: Payer: Self-pay | Admitting: Emergency Medicine

## 2018-11-01 ENCOUNTER — Other Ambulatory Visit: Payer: Self-pay

## 2018-11-01 ENCOUNTER — Ambulatory Visit (INDEPENDENT_AMBULATORY_CARE_PROVIDER_SITE_OTHER): Payer: Medicare Other | Admitting: Emergency Medicine

## 2018-11-01 VITALS — BP 145/81 | HR 69 | Temp 98.6°F | Resp 18 | Ht 62.25 in | Wt 192.8 lb

## 2018-11-01 DIAGNOSIS — Z8739 Personal history of other diseases of the musculoskeletal system and connective tissue: Secondary | ICD-10-CM | POA: Diagnosis not present

## 2018-11-01 DIAGNOSIS — E119 Type 2 diabetes mellitus without complications: Secondary | ICD-10-CM

## 2018-11-01 DIAGNOSIS — I10 Essential (primary) hypertension: Secondary | ICD-10-CM | POA: Diagnosis not present

## 2018-11-01 DIAGNOSIS — Z23 Encounter for immunization: Secondary | ICD-10-CM

## 2018-11-01 DIAGNOSIS — E785 Hyperlipidemia, unspecified: Secondary | ICD-10-CM

## 2018-11-01 DIAGNOSIS — Z8669 Personal history of other diseases of the nervous system and sense organs: Secondary | ICD-10-CM

## 2018-11-01 DIAGNOSIS — E038 Other specified hypothyroidism: Secondary | ICD-10-CM

## 2018-11-01 LAB — COMPREHENSIVE METABOLIC PANEL
A/G RATIO: 1.3 (ref 1.2–2.2)
ALT: 25 IU/L (ref 0–32)
AST: 24 IU/L (ref 0–40)
Albumin: 4.3 g/dL (ref 3.7–4.7)
Alkaline Phosphatase: 80 IU/L (ref 39–117)
BILIRUBIN TOTAL: 0.5 mg/dL (ref 0.0–1.2)
BUN/Creatinine Ratio: 22 (ref 12–28)
BUN: 15 mg/dL (ref 8–27)
CHLORIDE: 100 mmol/L (ref 96–106)
CO2: 26 mmol/L (ref 20–29)
Calcium: 9.5 mg/dL (ref 8.7–10.3)
Creatinine, Ser: 0.67 mg/dL (ref 0.57–1.00)
GFR calc non Af Amer: 89 mL/min/{1.73_m2} (ref >59)
GFR, EST AFRICAN AMERICAN: 102 mL/min/{1.73_m2} (ref >59)
GLUCOSE: 111 mg/dL — AB (ref 65–99)
Globulin, Total: 3.4 g/dL (ref 1.5–4.5)
POTASSIUM: 4.1 mmol/L (ref 3.5–5.2)
Sodium: 142 mmol/L (ref 134–144)
TOTAL PROTEIN: 7.7 g/dL (ref 6.0–8.5)

## 2018-11-01 LAB — LIPID PANEL
Chol/HDL Ratio: 3.4 ratio (ref 0.0–4.4)
Cholesterol, Total: 132 mg/dL (ref 100–199)
HDL: 39 mg/dL — AB (ref >39)
LDL Calculated: 42 mg/dL (ref 0–99)
Triglycerides: 256 mg/dL — ABNORMAL HIGH (ref 0–149)
VLDL Cholesterol Cal: 51 mg/dL — ABNORMAL HIGH (ref 5–40)

## 2018-11-01 LAB — HEMOGLOBIN A1C
ESTIMATED AVERAGE GLUCOSE: 126 mg/dL
HEMOGLOBIN A1C: 6 % — AB (ref 4.8–5.6)

## 2018-11-01 MED ORDER — LOSARTAN POTASSIUM 100 MG PO TABS: ORAL_TABLET | ORAL | 3 refills | Status: DC

## 2018-11-01 MED ORDER — PROBENECID 500 MG PO TABS: 500.0000 mg | ORAL_TABLET | Freq: Two times a day (BID) | ORAL | 1 refills | Status: DC

## 2018-11-01 MED ORDER — GABAPENTIN 100 MG PO CAPS: 200.0000 mg | ORAL_CAPSULE | Freq: Every day | ORAL | 3 refills | Status: DC

## 2018-11-01 MED ORDER — PROBENECID 500 MG PO TABS: 500.0000 mg | ORAL_TABLET | Freq: Two times a day (BID) | ORAL | 3 refills | Status: DC

## 2018-11-01 MED ORDER — LEVOTHYROXINE SODIUM 75 MCG PO TABS: 75.0000 ug | ORAL_TABLET | Freq: Every day | ORAL | 3 refills | Status: DC

## 2018-11-01 MED ORDER — METOPROLOL TARTRATE 25 MG PO TABS: 25.0000 mg | ORAL_TABLET | Freq: Two times a day (BID) | ORAL | 3 refills | Status: DC

## 2018-11-01 MED ORDER — ROSUVASTATIN CALCIUM 10 MG PO TABS: ORAL_TABLET | ORAL | 3 refills | Status: DC

## 2018-11-01 MED ORDER — METFORMIN HCL 500 MG PO TABS: 500.0000 mg | ORAL_TABLET | Freq: Two times a day (BID) | ORAL | 3 refills | Status: DC

## 2018-11-01 NOTE — Progress Notes (Signed)
Lab Results  Component Value Date   HGBA1C 5.9 (H) 02/20/2018   BP Readings from Last 3 Encounters:  11/01/18 (!) 145/81  06/15/18 128/81  02/20/18 120/86   Lab Results  Component Value Date   CHOL 139 02/20/2018   HDL 37 (L) 02/20/2018   LDLCALC 57 02/20/2018   TRIG 226 (H) 02/20/2018   CHOLHDL 3.8 02/20/2018   Isabella Dixon 73 y.o.   Chief Complaint  Patient presents with  . Medication Refill    all meds     HISTORY OF PRESENT ILLNESS: This is a 73 y.o. female with multiple chronic medical problems here for medication refill. Has history of prediabetes and hypertension and dyslipidemia. History of gout with chronic joint pains. No other complaints or medical concerns today. Doing well overall.  HPI   Prior to Admission medications   Medication Sig Start Date End Date Taking? Authorizing Provider  chlorpheniramine (CHLOR-TRIMETON) 4 MG tablet Take 4 mg by mouth 2 (two) times daily as needed for allergies.   Yes [provider]  Cholecalciferol (VITAMIN D-3 PO) Take 50 mcg by mouth daily.   Yes [provider]  gabapentin (NEURONTIN) 100 MG capsule TAKE 2 CAPSULES AT BEDTIME 09/01/18  Yes Kadisha Goodine, Ines Bloomer, MD  losartan (COZAAR) 100 MG tablet TAKE 1 TABLET DAILY 02/20/18  Yes Horald Pollen, MD  Magnesium 250 MG TABS Take by mouth daily.   Yes [provider]  metoprolol tartrate (LOPRESSOR) 25 MG tablet TAKE 1 TABLET BY MOUTH TWICE A DAY 03/10/18  Yes Yaw Escoto, Ines Bloomer, MD  Multiple Vitamin (MULTIVITAMIN) tablet Take 1 tablet by mouth daily.   Yes [provider]  Naproxen Sodium (ALEVE PO) Take by mouth.   Yes [provider]  OVER THE COUNTER MEDICATION OTC Vit D 1400 mg daily   Yes [provider]  probenecid (BENEMID) 500 MG tablet Take 1 tablet (500 mg total) by mouth 2 (two) times daily. 08/11/18 08/06/19 Yes Edmond Ginsberg, Ines Bloomer, MD  rosuvastatin (CRESTOR) 10 MG tablet TAKE 1 TABLET DAILY  02/20/18  Yes Dao Mearns, Ines Bloomer, MD  vitamin C (ASCORBIC ACID) 250 MG tablet Take 250 mg by mouth daily.   Yes [provider]  aspirin 81 MG tablet Aspirin Low Dose 81 mg tablet,delayed release  Take 1 tablet every day by oral route.    [provider]  Choline Fenofibrate (FENOFIBRIC ACID) 135 MG CPDR Take 135 mg by mouth daily. Patient not taking: Reported on 10/10/2017 11/21/15   Darlyne Russian, MD  levothyroxine (SYNTHROID, LEVOTHROID) 75 MCG tablet Take 1 tablet (75 mcg total) by mouth daily. 02/20/18 05/21/18  Horald Pollen, MD  metFORMIN (GLUCOPHAGE) 500 MG tablet Take 1 tablet (500 mg total) by mouth 2 (two) times daily with a meal. 02/20/18 05/21/18  Rylan Kaufmann, Ines Bloomer, MD  traMADol (ULTRAM) 50 MG tablet Take 1 tablet (50 mg total) by mouth every 8 (eight) hours as needed. Patient not taking: Reported on 11/01/2018 06/15/18   Horald Pollen, MD    No Known Allergies  Patient Active Problem List   Diagnosis Date Noted  . History of sciatica 09/06/2017  . Type 2 diabetes mellitus without complication, without long-term current use of insulin ([place]) 09/06/2017  . History of gout 09/06/2017  . Diabetes mellitus type 2 in obese ([place]) 05/13/2015  . Hypothyroid 07/25/2012  . Gouty arthritis 05/02/2012  . Osteoarthritis 05/02/2012  . Hypertension 05/02/2012  . Hyperlipidemia 05/02/2012  . Abnormal pap 05/02/2012  Past Medical History:  Diagnosis Date  . Anemia   . Arthritis   . Diabetes mellitus without complication ([place])   . Gout   . High cholesterol   . Hypertension   . Osteoporosis     Past Surgical History:  Procedure Laterality Date  . ABDOMINAL HYSTERECTOMY    . TUBAL LIGATION      Social History   Socioeconomic History  . Marital status: Married    Spouse name: Not on file  . Number of children: Not on file  . Years of education: Not on file  . Highest education level: Not on file  Occupational History  . Not on file   Social Needs  . Financial resource strain: Not on file  . Food insecurity:    Worry: Not on file    Inability: Not on file  . Transportation needs:    Medical: Not on file    Non-medical: Not on file  Tobacco Use  . Smoking status: Never Smoker  . Smokeless tobacco: Never Used  Substance and Sexual Activity  . Alcohol use: No  . Drug use: No  . Sexual activity: Yes  Lifestyle  . Physical activity:    Days per week: Not on file    Minutes per session: Not on file  . Stress: Not on file  Relationships  . Social connections:    Talks on phone: Not on file    Gets together: Not on file    Attends religious service: Not on file    Active member of club or organization: Not on file    Attends meetings of clubs or organizations: Not on file    Relationship status: Not on file  . Intimate partner violence:    Fear of current or ex partner: Not on file    Emotionally abused: Not on file    Physically abused: Not on file    Forced sexual activity: Not on file  Other Topics Concern  . Not on file  Social History Narrative  . Not on file    Family History  Problem Relation Age of Onset  . Heart disease Mother   . Stroke Mother   . Hypertension Mother   . Stroke Father   . Cancer Sister   . Cancer Son   . Stroke Paternal Grandmother   . Cancer Paternal Grandmother   . Stroke Maternal Grandmother   . Cancer Paternal Grandfather      Review of Systems  Constitutional: Negative.  Negative for chills and fever.  HENT: Negative.  Negative for hearing loss.   Eyes: Negative.  Negative for blurred vision and double vision.  Respiratory: Negative.  Negative for cough and shortness of breath.   Cardiovascular: Negative.  Negative for chest pain and palpitations.  Gastrointestinal: Negative.  Negative for abdominal pain, diarrhea, nausea and vomiting.  Genitourinary: Negative.  Negative for hematuria.  Musculoskeletal: Positive for joint pain.  Skin: Negative.  Negative for  rash.  Neurological: Negative.  Negative for dizziness and headaches.  Endo/Heme/Allergies: Negative.   All other systems reviewed and are negative.  Vitals:   11/01/18 0803  BP: (!) 145/81  Pulse: 69  Resp: 18  Temp: 98.6 F (37 C)  SpO2: 94%     Physical Exam Vitals signs reviewed.  Constitutional:      Appearance: Normal appearance.  HENT:     Head: Normocephalic and atraumatic.     Nose: Nose normal.     Mouth/Throat:  Mouth: Mucous membranes are moist.     Pharynx: Oropharynx is clear.  Eyes:     Extraocular Movements: Extraocular movements intact.     Conjunctiva/sclera: Conjunctivae normal.     Pupils: Pupils are equal, round, and reactive to light.  Neck:     Musculoskeletal: Normal range of motion and neck supple.  Cardiovascular:     Rate and Rhythm: Normal rate and regular rhythm.     Heart sounds: Normal heart sounds.  Pulmonary:     Effort: Pulmonary effort is normal.     Breath sounds: Normal breath sounds.  Abdominal:     Palpations: Abdomen is soft.     Tenderness: There is no abdominal tenderness.  Musculoskeletal: Normal range of motion.  Skin:    General: Skin is warm and dry.     Capillary Refill: Capillary refill takes less than 2 seconds.  Neurological:     General: No focal deficit present.     Mental Status: She is alert and oriented to person, place, and time.  Psychiatric:        Mood and Affect: Mood normal.        Behavior: Behavior normal.      ASSESSMENT & PLAN: Ray was seen today for medication refill.  Diagnoses and all orders for this visit:  Essential hypertension -     Comprehensive metabolic panel -     losartan (COZAAR) 100 MG tablet; TAKE 1 TABLET DAILY -     metoprolol tartrate (LOPRESSOR) 25 MG tablet; Take 1 tablet (25 mg total) by mouth 2 (two) times daily.  Need for vaccination against Streptococcus pneumoniae using pneumococcal conjugate vaccine 7 -     Pneumococcal polysaccharide vaccine 23-valent  greater than or equal to 2yo subcutaneous/IM  Type 2 diabetes mellitus without complication, without long-term current use of insulin ([facility]) -     Hemoglobin A1c -     metFORMIN (GLUCOPHAGE) 500 MG tablet; Take 1 tablet (500 mg total) by mouth 2 (two) times daily with a meal.  History of gout -     Discontinue: probenecid (BENEMID) 500 MG tablet; Take 1 tablet (500 mg total) by mouth 2 (two) times daily. -     probenecid (BENEMID) 500 MG tablet; Take 1 tablet (500 mg total) by mouth 2 (two) times daily.  Hyperlipidemia, unspecified hyperlipidemia type -     Lipid panel -     rosuvastatin (CRESTOR) 10 MG tablet; TAKE 1 TABLET DAILY  History of sciatica -     gabapentin (NEURONTIN) 100 MG capsule; Take 2 capsules (200 mg total) by mouth at bedtime.  Other specified hypothyroidism -     levothyroxine (SYNTHROID, LEVOTHROID) 75 MCG tablet; Take 1 tablet (75 mcg total) by mouth daily.   Patient Instructions       If you have lab work done today you will be contacted with your lab results within the next 2 weeks.  If you have not heard from [place] then please contact us. The fastest way to get your results is to register for My Chart.   IF you received an x-ray today, you will receive an invoice from [facility]. Please contact [facility] at [phone] with questions or concerns regarding your invoice.   IF you received labwork today, you will receive an invoice from [facility]. Please contact [facility] at [phone] with questions or concerns regarding your invoice.   Our billing staff will not be able to assist you with questions regarding bills from these companies.  You will be contacted with the lab results as soon as they are available. The fastest way to get your results is to activate your My Chart account. Instructions are located on the last page of this paperwork. If you have not heard from [place] regarding the results in 2 weeks, please contact this office.     Health Maintenance, Female Adopting a healthy lifestyle and getting preventive care can go a long way to promote health and wellness. Talk with your health care provider about what schedule of regular examinations is right for you. This is a good chance for you to check in with your provider about disease prevention and staying healthy. In between checkups, there are plenty of things you can do on your own. Experts have done a lot of research about which lifestyle changes and preventive measures are most likely to keep you healthy. Ask your health care provider for more information. Weight and diet Eat a healthy diet  Be sure to include plenty of vegetables, fruits, low-fat dairy products, and lean protein.  Do not eat a lot of foods high in solid fats, added sugars, or salt.  Get regular exercise. This is one of the most important things you can do for your health. ? Most adults should exercise for at least 150 minutes each week. The exercise should increase your heart rate and make you sweat (moderate-intensity exercise). ? Most adults should also do strengthening exercises at least twice a week. This is in addition to the moderate-intensity exercise. Maintain a healthy weight  Body mass index (BMI) is a measurement that can be used to identify possible weight problems. It estimates body fat based on height and weight. Your health care provider can help determine your BMI and help you achieve or maintain a healthy weight.  For females 32 years of age and older: ? A BMI below 18.5 is considered underweight. ? A BMI of 18.5 to 24.9 is normal. ? A BMI of 25 to 29.9 is considered overweight. ? A BMI of 30 and above is considered obese. Watch levels of cholesterol and blood lipids  You should start having your blood tested for lipids and cholesterol at 72 years of age, then have this test every 5 years.  You may need to have your cholesterol levels checked more often if: ? Your lipid or  cholesterol levels are high. ? You are older than 72 years of age. ? You are at high risk for heart disease. Cancer screening Lung Cancer  Lung cancer screening is recommended for adults 19-22 years old who are at high risk for lung cancer because of a history of smoking.  A yearly low-dose CT scan of the lungs is recommended for people who: ? Currently smoke. ? Have quit within the past 15 years. ? Have at least a 30-pack-year history of smoking. A pack year is smoking an average of one pack of cigarettes a day for 1 year.  Yearly screening should continue until it has been 15 years since you quit.  Yearly screening should stop if you develop a health problem that would prevent you from having lung cancer treatment. Breast Cancer  Practice breast self-awareness. This means understanding how your breasts normally appear and feel.  It also means doing regular breast self-exams. Let your health care provider know about any changes, no matter how small.  If you are in your 20s or 30s, you should have a clinical breast exam (CBE) by a health care provider every 1-3  years as part of a regular health exam.  If you are 11 or older, have a CBE every year. Also consider having a breast X-ray (mammogram) every year.  If you have a family history of breast cancer, talk to your health care provider about genetic screening.  If you are at high risk for breast cancer, talk to your health care provider about having an MRI and a mammogram every year.  Breast cancer gene (BRCA) assessment is recommended for women who have family members with BRCA-related cancers. BRCA-related cancers include: ? Breast. ? Ovarian. ? Tubal. ? Peritoneal cancers.  Results of the assessment will determine the need for genetic counseling and BRCA1 and BRCA2 testing. Cervical Cancer Your health care provider may recommend that you be screened regularly for cancer of the pelvic organs (ovaries, uterus, and vagina).  This screening involves a pelvic examination, including checking for microscopic changes to the surface of your cervix (Pap test). You may be encouraged to have this screening done every 3 years, beginning at age 33.  For women ages 73-65, health care providers may recommend pelvic exams and Pap testing every 3 years, or they may recommend the Pap and pelvic exam, combined with testing for human papilloma virus (HPV), every 5 years. Some types of HPV increase your risk of cervical cancer. Testing for HPV may also be done on women of any age with unclear Pap test results.  Other health care providers may not recommend any screening for nonpregnant women who are considered low risk for pelvic cancer and who do not have symptoms. Ask your health care provider if a screening pelvic exam is right for you.  If you have had past treatment for cervical cancer or a condition that could lead to cancer, you need Pap tests and screening for cancer for at least 20 years after your treatment. If Pap tests have been discontinued, your risk factors (such as having a new sexual partner) need to be reassessed to determine if screening should resume. Some women have medical problems that increase the chance of getting cervical cancer. In these cases, your health care provider may recommend more frequent screening and Pap tests. Colorectal Cancer  This type of cancer can be detected and often prevented.  Routine colorectal cancer screening usually begins at 72 years of age and continues through 72 years of age.  Your health care provider may recommend screening at an earlier age if you have risk factors for colon cancer.  Your health care provider may also recommend using home test kits to check for hidden blood in the stool.  A small camera at the end of a tube can be used to examine your colon directly (sigmoidoscopy or colonoscopy). This is done to check for the earliest forms of colorectal cancer.  Routine  screening usually begins at age 75.  Direct examination of the colon should be repeated every 5-10 years through 72 years of age. However, you may need to be screened more often if early forms of precancerous polyps or small growths are found. Skin Cancer  Check your skin from head to toe regularly.  Tell your health care provider about any new moles or changes in moles, especially if there is a change in a mole's shape or color.  Also tell your health care provider if you have a mole that is larger than the size of a pencil eraser.  Always use sunscreen. Apply sunscreen liberally and repeatedly throughout the day.  Protect yourself by wearing long  sleeves, pants, a wide-brimmed hat, and sunglasses whenever you are outside. Heart disease, diabetes, and high blood pressure  High blood pressure causes heart disease and increases the risk of stroke. High blood pressure is more likely to develop in: ? People who have blood pressure in the high end of the normal range (130-139/85-89 mm Hg). ? People who are overweight or obese. ? People who are African American.  If you are 46-77 years of age, have your blood pressure checked every 3-5 years. If you are 67 years of age or older, have your blood pressure checked every year. You should have your blood pressure measured twice-once when you are at a hospital or clinic, and once when you are not at a hospital or clinic. Record the average of the two measurements. To check your blood pressure when you are not at a hospital or clinic, you can use: ? An automated blood pressure machine at a pharmacy. ? A home blood pressure monitor.  If you are between 27 years and 71 years old, ask your health care provider if you should take aspirin to prevent strokes.  Have regular diabetes screenings. This involves taking a blood sample to check your fasting blood sugar level. ? If you are at a normal weight and have a low risk for diabetes, have this test once  every three years after 72 years of age. ? If you are overweight and have a high risk for diabetes, consider being tested at a younger age or more often. Preventing infection Hepatitis B  If you have a higher risk for hepatitis B, you should be screened for this virus. You are considered at high risk for hepatitis B if: ? You were born in a country where hepatitis B is common. Ask your health care provider which countries are considered high risk. ? Your parents were born in a high-risk country, and you have not been immunized against hepatitis B (hepatitis B vaccine). ? You have HIV or AIDS. ? You use needles to inject street drugs. ? You live with someone who has hepatitis B. ? You have had sex with someone who has hepatitis B. ? You get hemodialysis treatment. ? You take certain medicines for conditions, including cancer, organ transplantation, and autoimmune conditions. Hepatitis C  Blood testing is recommended for: ? Everyone born from [birth year] through 1964. ? Anyone with known risk factors for hepatitis C. Sexually transmitted infections (STIs)  You should be screened for sexually transmitted infections (STIs) including gonorrhea and chlamydia if: ? You are sexually active and are younger than 72 years of age. ? You are older than 72 years of age and your health care provider tells you that you are at risk for this type of infection. ? Your sexual activity has changed since you were last screened and you are at an increased risk for chlamydia or gonorrhea. Ask your health care provider if you are at risk.  If you do not have HIV, but are at risk, it may be recommended that you take a prescription medicine daily to prevent HIV infection. This is called pre-exposure prophylaxis (PrEP). You are considered at risk if: ? You are sexually active and do not regularly use condoms or know the HIV status of your partner(s). ? You take drugs by injection. ? You are sexually active with a partner  who has HIV. Talk with your health care provider about whether you are at high risk of being infected with HIV. If you choose to begin  PrEP, you should first be tested for HIV. You should then be tested every 3 months for as long as you are taking PrEP. Pregnancy  If you are premenopausal and you may become pregnant, ask your health care provider about preconception counseling.  If you may become pregnant, take 400 to 800 micrograms (mcg) of folic acid every day.  If you want to prevent pregnancy, talk to your health care provider about birth control (contraception). Osteoporosis and menopause  Osteoporosis is a disease in which the bones lose minerals and strength with aging. This can result in serious bone fractures. Your risk for osteoporosis can be identified using a bone density scan.  If you are 63 years of age or older, or if you are at risk for osteoporosis and fractures, ask your health care provider if you should be screened.  Ask your health care provider whether you should take a calcium or vitamin D supplement to lower your risk for osteoporosis.  Menopause may have certain physical symptoms and risks.  Hormone replacement therapy may reduce some of these symptoms and risks. Talk to your health care provider about whether hormone replacement therapy is right for you. Follow these instructions at home:  Schedule regular health, dental, and eye exams.  Stay current with your immunizations.  Do not use any tobacco products including cigarettes, chewing tobacco, or electronic cigarettes.  If you are pregnant, do not drink alcohol.  If you are breastfeeding, limit how much and how often you drink alcohol.  Limit alcohol intake to no more than 1 drink per day for nonpregnant women. One drink equals 12 ounces of beer, 5 ounces of wine, or 1 ounces of hard liquor.  Do not use street drugs.  Do not share needles.  Ask your health care provider for help if you need support  or information about quitting drugs.  Tell your health care provider if you often feel depressed.  Tell your health care provider if you have ever been abused or do not feel safe at home. This information is not intended to replace advice given to you by your health care provider. Make sure you discuss any questions you have with your health care provider. Document Released: 02/22/2011 Document Revised: 01/15/2016 Document Reviewed: 05/13/2015 Elsevier Interactive Patient Education  2019 [employer]      Agustina Caroli, MD [facility]

## 2018-11-01 NOTE — Patient Instructions (Addendum)
If you have lab work done today you will be contacted with your lab results within the next 2 weeks.  If you have not heard from [place] then please contact us. The fastest way to get your results is to register for My Chart.   IF you received an x-ray today, you will receive an invoice from [facility]. Please contact [facility] at [phone] with questions or concerns regarding your invoice.   IF you received labwork today, you will receive an invoice from [place]. Please contact [facility] at [phone] with questions or concerns regarding your invoice.   Our billing staff will not be able to assist you with questions regarding bills from these companies.  You will be contacted with the lab results as soon as they are available. The fastest way to get your results is to activate your My Chart account. Instructions are located on the last page of this paperwork. If you have not heard from [place] regarding the results in 2 weeks, please contact this office.    Health Maintenance, Female Adopting a healthy lifestyle and getting preventive care can go a long way to promote health and wellness. Talk with your health care provider about what schedule of regular examinations is right for you. This is a good chance for you to check in with your provider about disease prevention and staying healthy. In between checkups, there are plenty of things you can do on your own. Experts have done a lot of research about which lifestyle changes and preventive measures are most likely to keep you healthy. Ask your health care provider for more information. Weight and diet Eat a healthy diet  Be sure to include plenty of vegetables, fruits, low-fat dairy products, and lean protein.  Do not eat a lot of foods high in solid fats, added sugars, or salt.  Get regular exercise. This is one of the most important things you can do for your health. ? Most adults should exercise for at least 150  minutes each week. The exercise should increase your heart rate and make you sweat (moderate-intensity exercise). ? Most adults should also do strengthening exercises at least twice a week. This is in addition to the moderate-intensity exercise. Maintain a healthy weight  Body mass index (BMI) is a measurement that can be used to identify possible weight problems. It estimates body fat based on height and weight. Your health care provider can help determine your BMI and help you achieve or maintain a healthy weight.  For females 12 years of age and older: ? A BMI below 18.5 is considered underweight. ? A BMI of 18.5 to 24.9 is normal. ? A BMI of 25 to 29.9 is considered overweight. ? A BMI of 30 and above is considered obese. Watch levels of cholesterol and blood lipids  You should start having your blood tested for lipids and cholesterol at 72 years of age, then have this test every 5 years.  You may need to have your cholesterol levels checked more often if: ? Your lipid or cholesterol levels are high. ? You are older than 72 years of age. ? You are at high risk for heart disease. Cancer screening Lung Cancer  Lung cancer screening is recommended for adults 58-36 years old who are at high risk for lung cancer because of a history of smoking.  A yearly low-dose CT scan of the lungs is recommended for people who: ? Currently smoke. ? Have quit within the past 15  years. ? Have at least a 30-pack-year history of smoking. A pack year is smoking an average of one pack of cigarettes a day for 1 year.  Yearly screening should continue until it has been 15 years since you quit.  Yearly screening should stop if you develop a health problem that would prevent you from having lung cancer treatment. Breast Cancer  Practice breast self-awareness. This means understanding how your breasts normally appear and feel.  It also means doing regular breast self-exams. Let your health care provider  know about any changes, no matter how small.  If you are in your 20s or 30s, you should have a clinical breast exam (CBE) by a health care provider every 1-3 years as part of a regular health exam.  If you are 3 or older, have a CBE every year. Also consider having a breast X-ray (mammogram) every year.  If you have a family history of breast cancer, talk to your health care provider about genetic screening.  If you are at high risk for breast cancer, talk to your health care provider about having an MRI and a mammogram every year.  Breast cancer gene (BRCA) assessment is recommended for women who have family members with BRCA-related cancers. BRCA-related cancers include: ? Breast. ? Ovarian. ? Tubal. ? Peritoneal cancers.  Results of the assessment will determine the need for genetic counseling and BRCA1 and BRCA2 testing. Cervical Cancer Your health care provider may recommend that you be screened regularly for cancer of the pelvic organs (ovaries, uterus, and vagina). This screening involves a pelvic examination, including checking for microscopic changes to the surface of your cervix (Pap test). You may be encouraged to have this screening done every 3 years, beginning at age 63.  For women ages 71-65, health care providers may recommend pelvic exams and Pap testing every 3 years, or they may recommend the Pap and pelvic exam, combined with testing for human papilloma virus (HPV), every 5 years. Some types of HPV increase your risk of cervical cancer. Testing for HPV may also be done on women of any age with unclear Pap test results.  Other health care providers may not recommend any screening for nonpregnant women who are considered low risk for pelvic cancer and who do not have symptoms. Ask your health care provider if a screening pelvic exam is right for you.  If you have had past treatment for cervical cancer or a condition that could lead to cancer, you need Pap tests and  screening for cancer for at least 20 years after your treatment. If Pap tests have been discontinued, your risk factors (such as having a new sexual partner) need to be reassessed to determine if screening should resume. Some women have medical problems that increase the chance of getting cervical cancer. In these cases, your health care provider may recommend more frequent screening and Pap tests. Colorectal Cancer  This type of cancer can be detected and often prevented.  Routine colorectal cancer screening usually begins at 72 years of age and continues through 72 years of age.  Your health care provider may recommend screening at an earlier age if you have risk factors for colon cancer.  Your health care provider may also recommend using home test kits to check for hidden blood in the stool.  A small camera at the end of a tube can be used to examine your colon directly (sigmoidoscopy or colonoscopy). This is done to check for the earliest forms of colorectal cancer.  Routine screening usually begins at age 50.  Direct examination of the colon should be repeated every 5-10 years through 72 years of age. However, you may need to be screened more often if early forms of precancerous polyps or small growths are found. Skin Cancer  Check your skin from head to toe regularly.  Tell your health care provider about any new moles or changes in moles, especially if there is a change in a mole's shape or color.  Also tell your health care provider if you have a mole that is larger than the size of a pencil eraser.  Always use sunscreen. Apply sunscreen liberally and repeatedly throughout the day.  Protect yourself by wearing long sleeves, pants, a wide-brimmed hat, and sunglasses whenever you are outside. Heart disease, diabetes, and high blood pressure  High blood pressure causes heart disease and increases the risk of stroke. High blood pressure is more likely to develop in: ? People who  have blood pressure in the high end of the normal range (130-139/85-89 mm Hg). ? People who are overweight or obese. ? People who are African American.  If you are 18-39 years of age, have your blood pressure checked every 3-5 years. If you are 40 years of age or older, have your blood pressure checked every year. You should have your blood pressure measured twice-once when you are at a hospital or clinic, and once when you are not at a hospital or clinic. Record the average of the two measurements. To check your blood pressure when you are not at a hospital or clinic, you can use: ? An automated blood pressure machine at a pharmacy. ? A home blood pressure monitor.  If you are between 55 years and 79 years old, ask your health care provider if you should take aspirin to prevent strokes.  Have regular diabetes screenings. This involves taking a blood sample to check your fasting blood sugar level. ? If you are at a normal weight and have a low risk for diabetes, have this test once every three years after 72 years of age. ? If you are overweight and have a high risk for diabetes, consider being tested at a younger age or more often. Preventing infection Hepatitis B  If you have a higher risk for hepatitis B, you should be screened for this virus. You are considered at high risk for hepatitis B if: ? You were born in a country where hepatitis B is common. Ask your health care provider which countries are considered high risk. ? Your parents were born in a high-risk country, and you have not been immunized against hepatitis B (hepatitis B vaccine). ? You have HIV or AIDS. ? You use needles to inject street drugs. ? You live with someone who has hepatitis B. ? You have had sex with someone who has hepatitis B. ? You get hemodialysis treatment. ? You take certain medicines for conditions, including cancer, organ transplantation, and autoimmune conditions. Hepatitis C  Blood testing is  recommended for: ? Everyone born from 1945 through 1965. ? Anyone with known risk factors for hepatitis C. Sexually transmitted infections (STIs)  You should be screened for sexually transmitted infections (STIs) including gonorrhea and chlamydia if: ? You are sexually active and are younger than 72 years of age. ? You are older than 72 years of age and your health care provider tells you that you are at risk for this type of infection. ? Your sexual activity has changed since you   were last screened and you are at an increased risk for chlamydia or gonorrhea. Ask your health care provider if you are at risk.  If you do not have HIV, but are at risk, it may be recommended that you take a prescription medicine daily to prevent HIV infection. This is called pre-exposure prophylaxis (PrEP). You are considered at risk if: ? You are sexually active and do not regularly use condoms or know the HIV status of your partner(s). ? You take drugs by injection. ? You are sexually active with a partner who has HIV. Talk with your health care provider about whether you are at high risk of being infected with HIV. If you choose to begin PrEP, you should first be tested for HIV. You should then be tested every 3 months for as long as you are taking PrEP. Pregnancy  If you are premenopausal and you may become pregnant, ask your health care provider about preconception counseling.  If you may become pregnant, take 400 to 800 micrograms (mcg) of folic acid every day.  If you want to prevent pregnancy, talk to your health care provider about birth control (contraception). Osteoporosis and menopause  Osteoporosis is a disease in which the bones lose minerals and strength with aging. This can result in serious bone fractures. Your risk for osteoporosis can be identified using a bone density scan.  If you are 65 years of age or older, or if you are at risk for osteoporosis and fractures, ask your health care  provider if you should be screened.  Ask your health care provider whether you should take a calcium or vitamin D supplement to lower your risk for osteoporosis.  Menopause may have certain physical symptoms and risks.  Hormone replacement therapy may reduce some of these symptoms and risks. Talk to your health care provider about whether hormone replacement therapy is right for you. Follow these instructions at home:  Schedule regular health, dental, and eye exams.  Stay current with your immunizations.  Do not use any tobacco products including cigarettes, chewing tobacco, or electronic cigarettes.  If you are pregnant, do not drink alcohol.  If you are breastfeeding, limit how much and how often you drink alcohol.  Limit alcohol intake to no more than 1 drink per day for nonpregnant women. One drink equals 12 ounces of beer, 5 ounces of wine, or 1 ounces of hard liquor.  Do not use street drugs.  Do not share needles.  Ask your health care provider for help if you need support or information about quitting drugs.  Tell your health care provider if you often feel depressed.  Tell your health care provider if you have ever been abused or do not feel safe at home. This information is not intended to replace advice given to you by your health care provider. Make sure you discuss any questions you have with your health care provider. Document Released: 02/22/2011 Document Revised: 01/15/2016 Document Reviewed: 05/13/2015 Elsevier Interactive Patient Education  2019 [employer]  

## 2018-12-05 ENCOUNTER — Telehealth: Payer: Self-pay | Admitting: Registered Nurse

## 2018-12-05 NOTE — Telephone Encounter (Signed)
Called pt to schedule AWV. No answer. Left VM 

## 2018-12-25 ENCOUNTER — Telehealth: Payer: Self-pay | Admitting: *Deleted

## 2018-12-25 NOTE — Telephone Encounter (Signed)
Schedule AWV.  

## 2019-01-17 ENCOUNTER — Telehealth: Payer: Self-pay | Admitting: *Deleted

## 2019-01-17 NOTE — Telephone Encounter (Signed)
Schedule AWV.  

## 2019-03-14 ENCOUNTER — Telehealth: Payer: Self-pay | Admitting: Emergency Medicine

## 2019-03-14 NOTE — Telephone Encounter (Signed)
Left 3 VM's to reschedule appt for 05/04/2019 with Dr. Mitchel Honour. Cancelling appt.

## 2019-04-02 LAB — HM DIABETES EYE EXAM

## 2019-04-05 ENCOUNTER — Encounter: Payer: Self-pay | Admitting: *Deleted

## 2019-05-02 ENCOUNTER — Other Ambulatory Visit: Payer: Self-pay

## 2019-05-02 ENCOUNTER — Ambulatory Visit (INDEPENDENT_AMBULATORY_CARE_PROVIDER_SITE_OTHER): Payer: Medicare Other | Admitting: Emergency Medicine

## 2019-05-02 ENCOUNTER — Encounter: Payer: Self-pay | Admitting: Emergency Medicine

## 2019-05-02 VITALS — BP 150/83 | HR 62 | Temp 98.8°F | Resp 16 | Ht 63.0 in | Wt 187.2 lb

## 2019-05-02 DIAGNOSIS — Z23 Encounter for immunization: Secondary | ICD-10-CM

## 2019-05-02 DIAGNOSIS — I10 Essential (primary) hypertension: Secondary | ICD-10-CM | POA: Diagnosis not present

## 2019-05-02 DIAGNOSIS — M159 Polyosteoarthritis, unspecified: Secondary | ICD-10-CM

## 2019-05-02 DIAGNOSIS — Z8739 Personal history of other diseases of the musculoskeletal system and connective tissue: Secondary | ICD-10-CM

## 2019-05-02 DIAGNOSIS — Z8669 Personal history of other diseases of the nervous system and sense organs: Secondary | ICD-10-CM | POA: Diagnosis not present

## 2019-05-02 DIAGNOSIS — M109 Gout, unspecified: Secondary | ICD-10-CM

## 2019-05-02 DIAGNOSIS — E119 Type 2 diabetes mellitus without complications: Secondary | ICD-10-CM | POA: Diagnosis not present

## 2019-05-02 DIAGNOSIS — E785 Hyperlipidemia, unspecified: Secondary | ICD-10-CM

## 2019-05-02 MED ORDER — GABAPENTIN 100 MG PO CAPS: 200.0000 mg | ORAL_CAPSULE | Freq: Every day | ORAL | 3 refills | Status: DC

## 2019-05-02 NOTE — Patient Instructions (Addendum)
   If you have lab work done today you will be contacted with your lab results within the next 2 weeks.  If you have not heard from us then please contact us. The fastest way to get your results is to register for My Chart.   IF you received an x-ray today, you will receive an invoice from [facility]. Please contact [facility] at [phone] with questions or concerns regarding your invoice.   IF you received labwork today, you will receive an invoice from [facility]. Please contact [facility] at [phone] with questions or concerns regarding your invoice.   Our billing staff will not be able to assist you with questions regarding bills from these companies.  You will be contacted with the lab results as soon as they are available. The fastest way to get your results is to activate your My Chart account. Instructions are located on the last page of this paperwork. If you have not heard from us regarding the results in 2 weeks, please contact this office.     Hypertension, Adult High blood pressure (hypertension) is when the force of blood pumping through the arteries is too strong. The arteries are the blood vessels that carry blood from the heart throughout the body. Hypertension forces the heart to work harder to pump blood and may cause arteries to become narrow or stiff. Untreated or uncontrolled hypertension can cause a heart attack, heart failure, a stroke, kidney disease, and other problems. A blood pressure reading consists of a higher number over a lower number. Ideally, your blood pressure should be below 120/80. The first ("top") number is called the systolic pressure. It is a measure of the pressure in your arteries as your heart beats. The second ("bottom") number is called the diastolic pressure. It is a measure of the pressure in your arteries as the heart relaxes. What are the causes? The exact cause of this condition is not known. There are some conditions  that result in or are related to high blood pressure. What increases the risk? Some risk factors for high blood pressure are under your control. The following factors may make you more likely to develop this condition:  Smoking.  Having type 2 diabetes mellitus, high cholesterol, or both.  Not getting enough exercise or physical activity.  Being overweight.  Having too much fat, sugar, calories, or salt (sodium) in your diet.  Drinking too much alcohol. Some risk factors for high blood pressure may be difficult or impossible to change. Some of these factors include:  Having chronic kidney disease.  Having a family history of high blood pressure.  Age. Risk increases with age.  Race. You may be at higher risk if you are African American.  Gender. Men are at higher risk than women before age 45. After age 65, women are at higher risk than men.  Having obstructive sleep apnea.  Stress. What are the signs or symptoms? High blood pressure may not cause symptoms. Very high blood pressure (hypertensive crisis) may cause:  Headache.  Anxiety.  Shortness of breath.  Nosebleed.  Nausea and vomiting.  Vision changes.  Severe chest pain.  Seizures. How is this diagnosed? This condition is diagnosed by measuring your blood pressure while you are seated, with your arm resting on a flat surface, your legs uncrossed, and your feet flat on the floor. The cuff of the blood pressure monitor will be placed directly against the skin of your upper arm at the level of your heart.   It should be measured at least twice using the same arm. Certain conditions can cause a difference in blood pressure between your right and left arms. Certain factors can cause blood pressure readings to be lower or higher than normal for a short period of time:  When your blood pressure is higher when you are in a health care provider's office than when you are at home, this is called white coat hypertension.  Most people with this condition do not need medicines.  When your blood pressure is higher at home than when you are in a health care provider's office, this is called masked hypertension. Most people with this condition may need medicines to control blood pressure. If you have a high blood pressure reading during one visit or you have normal blood pressure with other risk factors, you may be asked to:  Return on a different day to have your blood pressure checked again.  Monitor your blood pressure at home for 1 week or longer. If you are diagnosed with hypertension, you may have other blood or imaging tests to help your health care provider understand your overall risk for other conditions. How is this treated? This condition is treated by making healthy lifestyle changes, such as eating healthy foods, exercising more, and reducing your alcohol intake. Your health care provider may prescribe medicine if lifestyle changes are not enough to get your blood pressure under control, and if:  Your systolic blood pressure is above 130.  Your diastolic blood pressure is above 80. Your personal target blood pressure may vary depending on your medical conditions, your age, and other factors. Follow these instructions at home: Eating and drinking   Eat a diet that is high in fiber and potassium, and low in sodium, added sugar, and fat. An example eating plan is called the DASH (Dietary Approaches to Stop Hypertension) diet. To eat this way: ? Eat plenty of fresh fruits and vegetables. Try to fill one half of your plate at each meal with fruits and vegetables. ? Eat whole grains, such as whole-wheat pasta, brown rice, or whole-grain bread. Fill about one fourth of your plate with whole grains. ? Eat or drink low-fat dairy products, such as skim milk or low-fat yogurt. ? Avoid fatty cuts of meat, processed or cured meats, and poultry with skin. Fill about one fourth of your plate with lean proteins, such  as fish, chicken without skin, beans, eggs, or tofu. ? Avoid pre-made and processed foods. These tend to be higher in sodium, added sugar, and fat.  Reduce your daily sodium intake. Most people with hypertension should eat less than 1,500 mg of sodium a day.  Do not drink alcohol if: ? Your health care provider tells you not to drink. ? You are pregnant, may be pregnant, or are planning to become pregnant.  If you drink alcohol: ? Limit how much you use to:  0-1 drink a day for women.  0-2 drinks a day for men. ? Be aware of how much alcohol is in your drink. In the [place] one drink equals one 12 oz bottle of beer (355 mL), one 5 oz glass of wine (148 mL), or one 1 oz glass of hard liquor (44 mL). Lifestyle   Work with your health care provider to maintain a healthy body weight or to lose weight. Ask what an ideal weight is for you.  Get at least 30 minutes of exercise most days of the week. Activities may include walking, swimming, or   biking.  Include exercise to strengthen your muscles (resistance exercise), such as Pilates or lifting weights, as part of your weekly exercise routine. Try to do these types of exercises for 30 minutes at least 3 days a week.  Do not use any products that contain nicotine or tobacco, such as cigarettes, e-cigarettes, and chewing tobacco. If you need help quitting, ask your health care provider.  Monitor your blood pressure at home as told by your health care provider.  Keep all follow-up visits as told by your health care provider. This is important. Medicines  Take over-the-counter and prescription medicines only as told by your health care provider. Follow directions carefully. Blood pressure medicines must be taken as prescribed.  Do not skip doses of blood pressure medicine. Doing this puts you at risk for problems and can make the medicine less effective.  Ask your health care provider about side effects or reactions to medicines that you  should watch for. Contact a health care provider if you:  Think you are having a reaction to a medicine you are taking.  Have headaches that keep coming back (recurring).  Feel dizzy.  Have swelling in your ankles.  Have trouble with your vision. Get help right away if you:  Develop a severe headache or confusion.  Have unusual weakness or numbness.  Feel faint.  Have severe pain in your chest or abdomen.  Vomit repeatedly.  Have trouble breathing. Summary  Hypertension is when the force of blood pumping through your arteries is too strong. If this condition is not controlled, it may put you at risk for serious complications.  Your personal target blood pressure may vary depending on your medical conditions, your age, and other factors. For most people, a normal blood pressure is less than 120/80.  Hypertension is treated with lifestyle changes, medicines, or a combination of both. Lifestyle changes include losing weight, eating a healthy, low-sodium diet, exercising more, and limiting alcohol. This information is not intended to replace advice given to you by your health care provider. Make sure you discuss any questions you have with your health care provider. Document Released: 08/09/2005 Document Revised: 04/19/2018 Document Reviewed: 04/19/2018 Elsevier Patient Education  2020 [employer]  

## 2019-05-02 NOTE — Progress Notes (Signed)
Lab Results  Component Value Date   HGBA1C 6.0 (H) 11/01/2018   BP Readings from Last 3 Encounters:  05/02/19 (!) 150/83  11/01/18 (!) 145/81  06/15/18 128/81   Lab Results  Component Value Date   CREATININE 0.67 11/01/2018   BUN 15 11/01/2018   NA 142 11/01/2018   K 4.1 11/01/2018   CL 100 11/01/2018   CO2 26 11/01/2018   The 10-year ASCVD risk score Isabella Bussing DC Jr., et al., 2013) is: 34.7%   Values used to calculate the score:     Age: 73 years     Sex: Female     Is Non-Hispanic African American: No     Diabetic: Yes     Tobacco smoker: No     Systolic Blood Pressure: 710 mmHg     Is BP treated: Yes     HDL Cholesterol: 39 mg/dL     Total Cholesterol: 132 mg/dL Isabella Dixon 73 y.o.   Chief Complaint  Patient presents with  . Hypertension    6 month follow up  . Medication Refill    GABAPENTIN    HISTORY OF PRESENT ILLNESS: This is a 73 y.o. female with history of hypertension here for follow-up. 1.  Hypertension: Takes losartan 100 mg daily and Lopressor 25 mg twice a day.  Blood pressure readings at home within normal limits. 2.  Gout and osteoarthritis.  Takes probenecid 500 mg 3 times a day.  Needs rheumatology referral.  Old rheumatologist retired. 3.  Chronic arthritis pain and occasional sciatica.  Takes gabapentin 200 mg at bedtime.  Needs medication refill. 4.  Hypothyroidism: Takes Synthroid 75 mcg daily. 5.  Diabetes: On metformin 500 mg twice a day. 6.  Dyslipidemia: On Crestor 10 mg a day. No complaints or medical concerns today.  HPI   Prior to Admission medications   Medication Sig Start Date End Date Taking? Authorizing Provider  aspirin 81 MG tablet Aspirin Low Dose 81 mg tablet,delayed release  Take 1 tablet every day by oral route.   Yes [provider]  chlorpheniramine (CHLOR-TRIMETON) 4 MG tablet Take 4 mg by mouth 2 (two) times daily as needed for allergies.   Yes [provider]  Cholecalciferol (VITAMIN D-3  PO) Take 50 mcg by mouth daily.   Yes [provider]  losartan (COZAAR) 100 MG tablet TAKE 1 TABLET DAILY 11/01/18  Yes Stina Gane, Ines Bloomer, MD  Multiple Vitamin (MULTIVITAMIN) tablet Take 1 tablet by mouth daily.   Yes [provider]  Naproxen Sodium (ALEVE PO) Take by mouth.   Yes [provider]  OVER THE COUNTER MEDICATION OTC Vit D 1400 mg daily   Yes [provider]  rosuvastatin (CRESTOR) 10 MG tablet TAKE 1 TABLET DAILY 11/01/18  Yes Shayde Gervacio, Ines Bloomer, MD  vitamin C (ASCORBIC ACID) 250 MG tablet Take 250 mg by mouth daily.   Yes [provider]  Choline Fenofibrate (FENOFIBRIC ACID) 135 MG CPDR Take 135 mg by mouth daily. Patient not taking: Reported on 10/10/2017 11/21/15   Darlyne Russian, MD  gabapentin (NEURONTIN) 100 MG capsule Take 2 capsules (200 mg total) by mouth at bedtime. 11/01/18 01/30/19  Horald Pollen, MD  levothyroxine (SYNTHROID, LEVOTHROID) 75 MCG tablet Take 1 tablet (75 mcg total) by mouth daily. 11/01/18 01/30/19  Horald Pollen, MD  Magnesium 250 MG TABS Take by mouth daily.    [provider]  metFORMIN (GLUCOPHAGE) 500 MG tablet Take 1 tablet (500 mg total)  by mouth 2 (two) times daily with a meal. 11/01/18 01/30/19  Paullette Mckain, Eilleen Kempf, MD  metoprolol tartrate (LOPRESSOR) 25 MG tablet Take 1 tablet (25 mg total) by mouth 2 (two) times daily. 11/01/18 01/30/19  Georgina Quint, MD  probenecid (BENEMID) 500 MG tablet Take 1 tablet (500 mg total) by mouth 2 (two) times daily. 11/01/18 01/30/19  Georgina Quint, MD  traMADol (ULTRAM) 50 MG tablet Take 1 tablet (50 mg total) by mouth every 8 (eight) hours as needed. Patient not taking: Reported on 11/01/2018 06/15/18   Georgina Quint, MD    No Active Allergies  Patient Active Problem List   Diagnosis Date Noted  . History of sciatica 09/06/2017  . Type 2 diabetes mellitus without complication, without long-term current use of insulin (HCC)  09/06/2017  . History of gout 09/06/2017  . Diabetes mellitus type 2 in obese (HCC) 05/13/2015  . Hypothyroid 07/25/2012  . Gouty arthritis 05/02/2012  . Osteoarthritis 05/02/2012  . Hypertension 05/02/2012  . Hyperlipidemia 05/02/2012    Past Medical History:  Diagnosis Date  . Anemia   . Arthritis   . Diabetes mellitus without complication (HCC)   . Gout   . High cholesterol   . Hypertension   . Osteoporosis     Past Surgical History:  Procedure Laterality Date  . ABDOMINAL HYSTERECTOMY    . TUBAL LIGATION      Social History   Socioeconomic History  . Marital status: Married    Spouse name: Not on file  . Number of children: Not on file  . Years of education: Not on file  . Highest education level: Not on file  Occupational History  . Not on file  Social Needs  . Financial resource strain: Not on file  . Food insecurity    Worry: Not on file    Inability: Not on file  . Transportation needs    Medical: Not on file    Non-medical: Not on file  Tobacco Use  . Smoking status: Never Smoker  . Smokeless tobacco: Never Used  Substance and Sexual Activity  . Alcohol use: No  . Drug use: No  . Sexual activity: Yes  Lifestyle  . Physical activity    Days per week: Not on file    Minutes per session: Not on file  . Stress: Not on file  Relationships  . Social Musician on phone: Not on file    Gets together: Not on file    Attends religious service: Not on file    Active member of club or organization: Not on file    Attends meetings of clubs or organizations: Not on file    Relationship status: Not on file  . Intimate partner violence    Fear of current or ex partner: Not on file    Emotionally abused: Not on file    Physically abused: Not on file    Forced sexual activity: Not on file  Other Topics Concern  . Not on file  Social History Narrative  . Not on file    Family History  Problem Relation Age of Onset  . Heart disease Mother    . Stroke Mother   . Hypertension Mother   . Stroke Father   . Cancer Sister   . Cancer Son   . Stroke Paternal Grandmother   . Cancer Paternal Grandmother   . Stroke Maternal Grandmother   . Cancer Paternal Grandfather  Review of Systems  Constitutional: Negative.  Negative for chills and fever.  HENT: Negative.  Negative for congestion and sore throat.   Eyes: Negative.   Respiratory: Negative.  Negative for cough and shortness of breath.   Cardiovascular: Negative.  Negative for chest pain and palpitations.  Gastrointestinal: Negative.  Negative for abdominal pain, blood in stool, diarrhea, melena, nausea and vomiting.  Genitourinary: Negative.  Negative for dysuria and hematuria.  Musculoskeletal: Positive for back pain and joint pain. Negative for neck pain.  Skin: Negative.  Negative for rash.  Neurological: Negative.  Negative for dizziness and headaches.  Endo/Heme/Allergies: Negative.   All other systems reviewed and are negative.   Vitals:   05/02/19 0913  BP: (!) 150/83  Pulse: 62  Resp: 16  Temp: 98.8 F (37.1 C)  SpO2: 96%    Physical Exam Vitals signs reviewed.  Constitutional:      Appearance: Normal appearance.  HENT:     Head: Normocephalic.  Eyes:     Extraocular Movements: Extraocular movements intact.     Conjunctiva/sclera: Conjunctivae normal.     Pupils: Pupils are equal, round, and reactive to light.  Neck:     Musculoskeletal: Normal range of motion and neck supple.  Cardiovascular:     Rate and Rhythm: Normal rate and regular rhythm.     Heart sounds: Normal heart sounds.  Pulmonary:     Effort: Pulmonary effort is normal.     Breath sounds: Normal breath sounds.  Musculoskeletal: Normal range of motion.  Skin:    General: Skin is warm and dry.     Capillary Refill: Capillary refill takes less than 2 seconds.  Neurological:     General: No focal deficit present.     Mental Status: She is alert and oriented to person, place,  and time.  Psychiatric:        Mood and Affect: Mood normal.        Behavior: Behavior normal.      ASSESSMENT & PLAN: Margarett was seen today for hypertension and medication refill.  Diagnoses and all orders for this visit:  Essential hypertension  Need for prophylactic vaccination and inoculation against influenza -     Flu Vaccine QUAD High Dose(Fluad)  History of sciatica -     gabapentin (NEURONTIN) 100 MG capsule; Take 2 capsules (200 mg total) by mouth at bedtime.  Type 2 diabetes mellitus without complication, without long-term current use of insulin (HCC)  History of gout  Hyperlipidemia, unspecified hyperlipidemia type  Gouty arthritis -     Ambulatory referral to Rheumatology  Osteoarthritis of multiple joints, unspecified osteoarthritis type -     Ambulatory referral to Rheumatology   Clinically stable.  No medical concerns identified during this visit.  Continue present medications.  Medication list reviewed with patient along with indication.  Follow-up in 6 months.   Patient Instructions       If you have lab work done today you will be contacted with your lab results within the next 2 weeks.  If you have not heard from [place] then please contact us. The fastest way to get your results is to register for My Chart.   IF you received an x-ray today, you will receive an invoice from [facility]. Please contact [facility] at [phone] with questions or concerns regarding your invoice.   IF you received labwork today, you will receive an invoice from [facility]. Please contact [facility] at [phone] with questions or concerns regarding your invoice.  Our billing staff will not be able to assist you with questions regarding bills from these companies.  You will be contacted with the lab results as soon as they are available. The fastest way to get your results is to activate your My Chart account. Instructions are located on the last page  of this paperwork. If you have not heard from us regarding the results in 2 weeks, please contact this office.     Hypertension, Adult High blood pressure (hypertension) is when the force of blood pumping through the arteries is too strong. The arteries are the blood vessels that carry blood from the heart throughout the body. Hypertension forces the heart to work harder to pump blood and may cause arteries to become narrow or stiff. Untreated or uncontrolled hypertension can cause a heart attack, heart failure, a stroke, kidney disease, and other problems. A blood pressure reading consists of a higher number over a lower number. Ideally, your blood pressure should be below 120/80. The first ("top") number is called the systolic pressure. It is a measure of the pressure in your arteries as your heart beats. The second ("bottom") number is called the diastolic pressure. It is a measure of the pressure in your arteries as the heart relaxes. What are the causes? The exact cause of this condition is not known. There are some conditions that result in or are related to high blood pressure. What increases the risk? Some risk factors for high blood pressure are under your control. The following factors may make you more likely to develop this condition:  Smoking.  Having type 2 diabetes mellitus, high cholesterol, or both.  Not getting enough exercise or physical activity.  Being overweight.  Having too much fat, sugar, calories, or salt (sodium) in your diet.  Drinking too much alcohol. Some risk factors for high blood pressure may be difficult or impossible to change. Some of these factors include:  Having chronic kidney disease.  Having a family history of high blood pressure.  Age. Risk increases with age.  Race. You may be at higher risk if you are African American.  Gender. Men are at higher risk than women before age 72. After age 72, women are at higher risk than men.  Having  obstructive sleep apnea.  Stress. What are the signs or symptoms? High blood pressure may not cause symptoms. Very high blood pressure (hypertensive crisis) may cause:  Headache.  Anxiety.  Shortness of breath.  Nosebleed.  Nausea and vomiting.  Vision changes.  Severe chest pain.  Seizures. How is this diagnosed? This condition is diagnosed by measuring your blood pressure while you are seated, with your arm resting on a flat surface, your legs uncrossed, and your feet flat on the floor. The cuff of the blood pressure monitor will be placed directly against the skin of your upper arm at the level of your heart. It should be measured at least twice using the same arm. Certain conditions can cause a difference in blood pressure between your right and left arms. Certain factors can cause blood pressure readings to be lower or higher than normal for a short period of time:  When your blood pressure is higher when you are in a health care provider's office than when you are at home, this is called white coat hypertension. Most people with this condition do not need medicines.  When your blood pressure is higher at home than when you are in a health care provider's office, this is  called masked hypertension. Most people with this condition may need medicines to control blood pressure. If you have a high blood pressure reading during one visit or you have normal blood pressure with other risk factors, you may be asked to:  Return on a different day to have your blood pressure checked again.  Monitor your blood pressure at home for 1 week or longer. If you are diagnosed with hypertension, you may have other blood or imaging tests to help your health care provider understand your overall risk for other conditions. How is this treated? This condition is treated by making healthy lifestyle changes, such as eating healthy foods, exercising more, and reducing your alcohol intake. Your health  care provider may prescribe medicine if lifestyle changes are not enough to get your blood pressure under control, and if:  Your systolic blood pressure is above 130.  Your diastolic blood pressure is above 80. Your personal target blood pressure may vary depending on your medical conditions, your age, and other factors. Follow these instructions at home: Eating and drinking   Eat a diet that is high in fiber and potassium, and low in sodium, added sugar, and fat. An example eating plan is called the DASH (Dietary Approaches to Stop Hypertension) diet. To eat this way: ? Eat plenty of fresh fruits and vegetables. Try to fill one half of your plate at each meal with fruits and vegetables. ? Eat whole grains, such as whole-wheat pasta, brown rice, or whole-grain bread. Fill about one fourth of your plate with whole grains. ? Eat or drink low-fat dairy products, such as skim milk or low-fat yogurt. ? Avoid fatty cuts of meat, processed or cured meats, and poultry with skin. Fill about one fourth of your plate with lean proteins, such as fish, chicken without skin, beans, eggs, or tofu. ? Avoid pre-made and processed foods. These tend to be higher in sodium, added sugar, and fat.  Reduce your daily sodium intake. Most people with hypertension should eat less than 1,500 mg of sodium a day.  Do not drink alcohol if: ? Your health care provider tells you not to drink. ? You are pregnant, may be pregnant, or are planning to become pregnant.  If you drink alcohol: ? Limit how much you use to:  0-1 drink a day for women.  0-2 drinks a day for men. ? Be aware of how much alcohol is in your drink. In the [place] one drink equals one 12 oz bottle of beer (355 mL), one 5 oz glass of wine (148 mL), or one 1 oz glass of hard liquor (44 mL). Lifestyle   Work with your health care provider to maintain a healthy body weight or to lose weight. Ask what an ideal weight is for you.  Get at least 30  minutes of exercise most days of the week. Activities may include walking, swimming, or biking.  Include exercise to strengthen your muscles (resistance exercise), such as Pilates or lifting weights, as part of your weekly exercise routine. Try to do these types of exercises for 30 minutes at least 3 days a week.  Do not use any products that contain nicotine or tobacco, such as cigarettes, e-cigarettes, and chewing tobacco. If you need help quitting, ask your health care provider.  Monitor your blood pressure at home as told by your health care provider.  Keep all follow-up visits as told by your health care provider. This is important. Medicines  Take over-the-counter and prescription medicines only  as told by your health care provider. Follow directions carefully. Blood pressure medicines must be taken as prescribed.  Do not skip doses of blood pressure medicine. Doing this puts you at risk for problems and can make the medicine less effective.  Ask your health care provider about side effects or reactions to medicines that you should watch for. Contact a health care provider if you:  Think you are having a reaction to a medicine you are taking.  Have headaches that keep coming back (recurring).  Feel dizzy.  Have swelling in your ankles.  Have trouble with your vision. Get help right away if you:  Develop a severe headache or confusion.  Have unusual weakness or numbness.  Feel faint.  Have severe pain in your chest or abdomen.  Vomit repeatedly.  Have trouble breathing. Summary  Hypertension is when the force of blood pumping through your arteries is too strong. If this condition is not controlled, it may put you at risk for serious complications.  Your personal target blood pressure may vary depending on your medical conditions, your age, and other factors. For most people, a normal blood pressure is less than 120/80.  Hypertension is treated with lifestyle  changes, medicines, or a combination of both. Lifestyle changes include losing weight, eating a healthy, low-sodium diet, exercising more, and limiting alcohol. This information is not intended to replace advice given to you by your health care provider. Make sure you discuss any questions you have with your health care provider. Document Released: 08/09/2005 Document Revised: 04/19/2018 Document Reviewed: 04/19/2018 Elsevier Patient Education  2020 [employer]      Edwina Barth, MD [facility]

## 2019-05-04 ENCOUNTER — Ambulatory Visit: Payer: Medicare Other | Admitting: Emergency Medicine

## 2019-06-05 NOTE — Progress Notes (Signed)
Office Visit Note  Patient: Isabella Dixon             Date of Birth: 06-04-1946           MRN: [id number]             PCP: Horald Pollen, MD Referring: Horald Pollen, * Visit Date: 06/19/2019 Occupation: @GUAROCC @  Subjective:  Management of gout.   History of Present Illness: Isabella Dixon is a 73 y.o. female seen in consultation per request of Dr. Mitchel Honour according to patient she was diagnosed with gout in 1999 with left toe pain and swelling.  She was initially treated at urgent care and then was referred to Dr. Charlestine Night.  She states she has been under care of Dr. Charlestine Night for many years.  She was started on colchicine and allopurinol but allopurinol was discontinued due to diarrhea.  She states she was switched to probenecid which maintained her symptoms very well.  She was initially on 4 tablets of probenecid a day and then it was reduced to 3 tablets a day.  She was told to take probenecid for a day for a flare.  She states she did really well for many years on the dosage.  When Dr. Charlestine Night retired her dose of probenecid was reduced to twice daily.  She states she had a major flare of gout 2 years ago at the time she was treated with probenecid pain medications and prednisone.  She has had minor twinges of gout since then and has not had a flare in a while.  She is currently taking probenecid 3 tablets a day.  She also has history of degenerative disease of lumbar spine which flares off and on.  Currently she is not having much back discomfort.  She is seen Dr. Durward Fortes for her lower back pain.  Activities of Daily Living:  Patient reports morning stiffness for 5 minutes.   Patient Denies nocturnal pain.  Difficulty dressing/grooming: Denies Difficulty climbing stairs: Denies Difficulty getting out of chair: Denies Difficulty using hands for taps, buttons, cutlery, and/or writing: Denies  Review of Systems  Constitutional: Negative for fatigue, night  sweats, weight gain and weight loss.  HENT: Negative for mouth sores, trouble swallowing, trouble swallowing, mouth dryness and nose dryness.   Eyes: Positive for dryness. Negative for pain, redness and visual disturbance.  Respiratory: Negative for cough, shortness of breath, wheezing and difficulty breathing.   Cardiovascular: Positive for swelling in legs/feet. Negative for chest pain, palpitations, hypertension and irregular heartbeat.  Gastrointestinal: Negative for blood in stool, constipation and diarrhea.  Endocrine: Negative for increased urination.  Genitourinary: Negative for difficulty urinating, painful urination and vaginal dryness.  Musculoskeletal: Positive for arthralgias, joint pain and morning stiffness. Negative for joint swelling, myalgias, muscle weakness, muscle tenderness and myalgias.  Skin: Negative for color change, rash, hair loss, skin tightness, ulcers and sensitivity to sunlight.  Allergic/Immunologic: Negative for susceptible to infections.  Neurological: Negative for dizziness, light-headedness, numbness, headaches, memory loss, night sweats and weakness.  Hematological: Negative for bruising/bleeding tendency and swollen glands.  Psychiatric/Behavioral: Negative for depressed mood, confusion and sleep disturbance. The patient is not nervous/anxious.     PMFS History:  Patient Active Problem List   Diagnosis Date Noted  . History of sciatica 09/06/2017  . Type 2 diabetes mellitus without complication, without long-term current use of insulin ([place]) 09/06/2017  . History of gout 09/06/2017  . Diabetes mellitus type 2 in obese ([place]) 05/13/2015  . Hypothyroid  07/25/2012  . Gouty arthritis 05/02/2012  . Osteoarthritis 05/02/2012  . Hypertension 05/02/2012  . Hyperlipidemia 05/02/2012    Past Medical History:  Diagnosis Date  . Anemia   . Arthritis   . Diabetes mellitus without complication (HCC)   . Gout   . High cholesterol   . Hypertension   .  Osteoporosis     Family History  Problem Relation Age of Onset  . Heart disease Mother   . Stroke Mother   . Hypertension Mother   . Stroke Father   . Colon cancer Sister   . SIDS Son   . Stroke Paternal Grandmother   . Cancer Paternal Grandmother   . ALS Brother   . Stroke Maternal Grandmother   . Cancer Paternal Grandfather   . Testicular cancer Son   . Down syndrome Son   . Heart disease Brother    Past Surgical History:  Procedure Laterality Date  . ABDOMINAL HYSTERECTOMY    . TUBAL LIGATION     Social History   Social History Narrative  . Not on file   Immunization History  Administered Date(s) Administered  . Fluad Quad(high Dose 65+) 05/02/2019  . Influenza Split 05/02/2012  . Influenza, High Dose Seasonal PF 06/15/2018  . Influenza,inj,Quad PF,6+ Mos 05/08/2013, 04/30/2014, 05/13/2015, 05/17/2016, 09/06/2017  . Pneumococcal Conjugate-13 05/13/2015  . Pneumococcal Polysaccharide-23 11/01/2018  . Tdap 03/14/2008     Objective: Vital Signs: BP (!) 177/85 (BP Location: Right Arm, Patient Position: Sitting, Cuff Size: Normal)   Pulse 63   Resp 14   Ht 5' 2.5" (1.588 m)   Wt 185 lb (83.9 kg)   BMI 33.30 kg/m    Physical Exam Vitals signs and nursing note reviewed.  Constitutional:      Appearance: She is well-developed.  HENT:     Head: Normocephalic and atraumatic.  Eyes:     Conjunctiva/sclera: Conjunctivae normal.  Neck:     Musculoskeletal: Normal range of motion.  Cardiovascular:     Rate and Rhythm: Normal rate and regular rhythm.     Heart sounds: Normal heart sounds.  Pulmonary:     Effort: Pulmonary effort is normal.     Breath sounds: Normal breath sounds.  Abdominal:     General: Bowel sounds are normal.     Palpations: Abdomen is soft.  Lymphadenopathy:     Cervical: No cervical adenopathy.  Skin:    General: Skin is warm and dry.     Capillary Refill: Capillary refill takes less than 2 seconds.  Neurological:     Mental Status:  She is alert and oriented to person, place, and time.  Psychiatric:        Behavior: Behavior normal.      Musculoskeletal Exam: C-spine was in good range of motion.  She had limited range of motion of her lumbar spine.  Shoulder joints and elbow joints were in good range of motion.  She has PIP and DIP thickening bilaterally with no synovitis.  No tophi were noted.  She has discomfort range of motion of bilateral knee joints without any warmth swelling or effusion.  She has bilateral first MTP, DIP and PIP thickening with no synovitis.  CDAI Exam: CDAI Score: - Patient Global: -; Provider Global: - Swollen: -; Tender: - Joint Exam   No joint exam has been documented for this visit   There is currently no information documented on the homunculus. Go to the Rheumatology activity and complete the homunculus joint exam.  Investigation:  No additional findings.  Imaging: Xr Foot 2 Views Left  Result Date: 06/19/2019 First MTP, PIP and DIP narrowing was noted.  No erosive changes were noted.  No intertarsal joint space narrowing was noted.  Dorsal spurring was noted.  No tibiotalar joint space narrowing was noted.  Small posterior inferior calcaneal spurs were noted. Impression: These findings are consistent with osteoarthritis of the foot.  Xr Foot 2 Views Right  Result Date: 06/19/2019 First MTP, PIP and DIP narrowing was noted.  No erosive changes were noted.  Dorsal spurring was noted.  Inferior and posterior calcaneal spurs were noted. Impression: These findings are consistent with osteoarthritis of the foot.  Xr Hand 2 View Left  Result Date: 06/19/2019 Severe PIP and DIP narrowing was noted.  Severe CMC joint space narrowing was noted.  No MCP, intercarpal radiocarpal joint space narrowing was noted.  No erosive changes were noted. Impression: These findings are consistent with severe osteoarthritis of the hand.  Xr Hand 2 View Right  Result Date: 06/19/2019 Severe PIP and  DIP joint space narrowing was noted.  Mild CMC joint narrowing was noted.  No intercarpal radiocarpal joint space narrowing was noted.  No MCP involvement was noted. Impression: These findings are consistent with severe osteoarthritis of the hand.  Xr Knee 3 View Left  Result Date: 06/19/2019 Severe medial compartment narrowing with intercondylar osteophytes was noted.  No chondrocalcinosis was noted.  Severe patellofemoral narrowing was noted. Impression: These findings are consistent with severe osteoarthritis and severe chondromalacia patella.  Xr Knee 3 View Right  Result Date: 06/19/2019 Severe medial compartment narrowing was noted.  No chondrocalcinosis was noted.  Severe patellofemoral narrowing was noted. Impression: These findings are consistent with severe osteoarthritis and severe chondromalacia patella.   Recent Labs: Lab Results  Component Value Date   WBC 5.6 02/20/2018   HGB 14.1 02/20/2018   PLT 251 02/20/2018   NA 142 11/01/2018   K 4.1 11/01/2018   CL 100 11/01/2018   CO2 26 11/01/2018   GLUCOSE 111 (H) 11/01/2018   BUN 15 11/01/2018   CREATININE 0.67 11/01/2018   BILITOT 0.5 11/01/2018   ALKPHOS 80 11/01/2018   AST 24 11/01/2018   ALT 25 11/01/2018   PROT 7.7 11/01/2018   ALBUMIN 4.3 11/01/2018   CALCIUM 9.5 11/01/2018   GFRAA 102 11/01/2018    Speciality Comments: No specialty comments available.  Procedures:  No procedures performed Allergies: Patient has no known allergies.   Assessment / Plan:     Visit Diagnoses: Gouty arthritis -patient has longstanding history of gout.  She states she was initially treated with allopurinol and colchicine.  She states allopurinol was discontinued due to severe diarrhea.  She states she could tolerate colchicine but has taken it only on as needed basis in the past.  Her gout has been very well controlled on probenecid by Dr. Kellie Simmering.  Her uric acid was always between 4 and 5.  She states she had no flares until her  probenecid was reduced to twice daily dosing.  She wants to stay on probenecid 3 times daily dosing.  I detailed discussion with the patient.  She is not interested in changing her medication.  I discussed with her if her GFR is normal then she can continue to take probenecid 3 times daily.  She should use colchicine on as needed basis for gout flares.  I will call in the prescriptions once we have lab results available.- Plan: Uric acid  Medication management -  Plan: CBC with Differential/Platelet, COMPLETE METABOLIC PANEL WITH GFR  Pain in both hands -she has DIP and PIP thickening with no synovitis.  Plan: XR Hand 2 View Right, XR Hand 2 View Left  Chronic pain of both knees -she has chronic pain and discomfort in her knee joints but no warmth swelling or effusion was noted.  Plan: XR KNEE 3 VIEW RIGHT, XR KNEE 3 VIEW LEFT.  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  Pain in both feet -she has chronic discomfort in her feet but no active synovitis today.  Plan: XR Foot 2 Views Right, XR Foot 2 Views Left.  X-rays were consistent with osteoarthritis of bilateral feet.  DDD (degenerative disc disease), lumbar-patient has history of degenerative disc disease with intermittent sciatica.  Currently she is asymptomatic.  Postmenopausal-patient states she has not had bone density in many years.  She would like for me to schedule a DEXA scan.  Type 2 diabetes mellitus without complication, without long-term current use of insulin (HCC)  History of hypothyroidism  Essential hypertension-her blood pressures are still elevated.  Have advised her to follow blood pressure closely and discussed with her PCP.  History of hyperlipidemia  Orders: Orders Placed This Encounter  Procedures  . XR Hand 2 View Right  . XR Hand 2 View Left  . XR KNEE 3 VIEW RIGHT  . XR KNEE 3 VIEW LEFT  . XR Foot 2 Views Right  . XR Foot 2 Views Left  . DG BONE DENSITY (DXA)  . CBC  with Differential/Platelet  . COMPLETE METABOLIC PANEL WITH GFR  . Uric acid   Meds ordered this encounter  Medications  . Colchicine 0.6 MG CAPS    Sig: Take 1.8 mg by mouth daily as needed (for gout flares).    Dispense:  60 capsule    Refill:  2    Face-to-face time spent with patient was 45 minutes. Greater than 50% of time was spent in counseling and coordination of care.  Follow-Up Instructions: Return for Gout, Osteoarthritis.   Pollyann SavoyShaili Oriya Kettering, MD  Note - This record has been created using [employer] software.  Chart creation errors have been sought, but may not always  have been located. Such creation errors do not reflect on  the standard of medical care.

## 2019-06-07 ENCOUNTER — Telehealth: Payer: Self-pay | Admitting: *Deleted

## 2019-06-07 NOTE — Telephone Encounter (Signed)
Schedule awv  

## 2019-06-19 ENCOUNTER — Telehealth: Payer: Self-pay | Admitting: Pharmacist

## 2019-06-19 ENCOUNTER — Ambulatory Visit: Payer: Self-pay

## 2019-06-19 ENCOUNTER — Ambulatory Visit (INDEPENDENT_AMBULATORY_CARE_PROVIDER_SITE_OTHER): Payer: Medicare Other | Admitting: Rheumatology

## 2019-06-19 ENCOUNTER — Telehealth: Payer: Self-pay

## 2019-06-19 ENCOUNTER — Other Ambulatory Visit: Payer: Self-pay

## 2019-06-19 ENCOUNTER — Encounter: Payer: Self-pay | Admitting: Rheumatology

## 2019-06-19 VITALS — BP 177/85 | HR 63 | Resp 14 | Ht 62.5 in | Wt 185.0 lb

## 2019-06-19 DIAGNOSIS — M25561 Pain in right knee: Secondary | ICD-10-CM

## 2019-06-19 DIAGNOSIS — M79642 Pain in left hand: Secondary | ICD-10-CM | POA: Diagnosis not present

## 2019-06-19 DIAGNOSIS — M79641 Pain in right hand: Secondary | ICD-10-CM

## 2019-06-19 DIAGNOSIS — M109 Gout, unspecified: Secondary | ICD-10-CM | POA: Diagnosis not present

## 2019-06-19 DIAGNOSIS — E119 Type 2 diabetes mellitus without complications: Secondary | ICD-10-CM

## 2019-06-19 DIAGNOSIS — Z79899 Other long term (current) drug therapy: Secondary | ICD-10-CM

## 2019-06-19 DIAGNOSIS — M25562 Pain in left knee: Secondary | ICD-10-CM

## 2019-06-19 DIAGNOSIS — M79672 Pain in left foot: Secondary | ICD-10-CM

## 2019-06-19 DIAGNOSIS — Z8639 Personal history of other endocrine, nutritional and metabolic disease: Secondary | ICD-10-CM

## 2019-06-19 DIAGNOSIS — Z780 Asymptomatic menopausal state: Secondary | ICD-10-CM

## 2019-06-19 DIAGNOSIS — Z8739 Personal history of other diseases of the musculoskeletal system and connective tissue: Secondary | ICD-10-CM

## 2019-06-19 DIAGNOSIS — M79671 Pain in right foot: Secondary | ICD-10-CM | POA: Diagnosis not present

## 2019-06-19 DIAGNOSIS — G8929 Other chronic pain: Secondary | ICD-10-CM

## 2019-06-19 DIAGNOSIS — I10 Essential (primary) hypertension: Secondary | ICD-10-CM

## 2019-06-19 DIAGNOSIS — M5136 Other intervertebral disc degeneration, lumbar region: Secondary | ICD-10-CM

## 2019-06-19 MED ORDER — COLCHICINE 0.6 MG PO CAPS: 1.8000 mg | ORAL_CAPSULE | Freq: Every day | ORAL | 2 refills | Status: DC | PRN

## 2019-06-19 NOTE — Telephone Encounter (Signed)
Per Dr. Estanislado Pandy, after labs please refill colchicine and probenecid. Thanks! (please refer to note for dosage).

## 2019-06-19 NOTE — Telephone Encounter (Signed)
A prescription for Probenecid 500 mg three times daily will be sent to mail order pharmacy for 90 day supply pending lab results.

## 2019-06-19 NOTE — Telephone Encounter (Signed)
A prescription for Probenecid 500 mg three times daily will be sent to mail order pharmacy for 90 day supply pending lab results.   

## 2019-06-19 NOTE — Patient Instructions (Addendum)
   Take colchicine (Mitigare) 2 capsules daily as needed for gout flares.  We will call in Probenecid prescription and colchicine once we receive your lab results

## 2019-06-19 NOTE — Progress Notes (Signed)
Pharmacy Note   Subjective:  Patient presents today to the [facility] to see Dr. Estanislado Pandy.   Patient was seen by the pharmacist for counseling on colchicine.  She has been on Probenecid four times daily in the past.  It was recently decreased to twice daily by her PCP due to concerns of kidney function.  She has not used colchicine for flares, only pain medication.   Objective: CBC    Component Value Date/Time   WBC 5.6 02/20/2018 0941   WBC 5.2 12/11/2015 0928   RBC 4.68 02/20/2018 0941   RBC 4.47 12/11/2015 0928   HGB 14.1 02/20/2018 0941   HCT 41.2 02/20/2018 0941   PLT 251 02/20/2018 0941   MCV 88 02/20/2018 0941   MCH 30.1 02/20/2018 0941   MCH 30.6 12/11/2015 0928   MCHC 34.2 02/20/2018 0941   MCHC 33.8 12/11/2015 0928   RDW 15.0 02/20/2018 0941   LYMPHSABS 2.4 02/20/2018 0941   MONOABS 624 12/11/2015 0928   EOSABS 0.1 02/20/2018 0941   BASOSABS 0.0 02/20/2018 0941    CMP     Component Value Date/Time   NA 142 11/01/2018 1020   K 4.1 11/01/2018 1020   CL 100 11/01/2018 1020   CO2 26 11/01/2018 1020   GLUCOSE 111 (H) 11/01/2018 1020   GLUCOSE 95 01/27/2016 0931   BUN 15 11/01/2018 1020   CREATININE 0.67 11/01/2018 1020   CREATININE 0.73 01/27/2016 0931   CALCIUM 9.5 11/01/2018 1020   PROT 7.7 11/01/2018 1020   ALBUMIN 4.3 11/01/2018 1020   AST 24 11/01/2018 1020   ALT 25 11/01/2018 1020   ALKPHOS 80 11/01/2018 1020   BILITOT 0.5 11/01/2018 1020   GFRNONAA 89 11/01/2018 1020   GFRNONAA 81 12/11/2015 0928   GFRAA 102 11/01/2018 1020   GFRAA >89 12/11/2015 0928    Uric Acid Lab Results  Component Value Date   LABURIC 4.3 05/13/2015      Assessment/Plan:  Counseled patient on the purpose proper use, and adverse effects of colchicine.  Reviewed most common side effects including nausea, vomiting, and diarrhea. She is on Crestor 10 mg.  When taken with colchicine can increase the risk of rhabdomyolysis. She will only be taking short courses as  needed so risk of rhabdomyolysis is low.  Advised patient of interaction and to monitor for signs/symptoms of rhabdomyolysis including muscle pain/weakness and dark colored urine.  Instructed patient to seek urgent medical care if she developed these symptoms.  Recommended patient to maintain adequate hydration while taking colchicine to decrease risk of rhabdomyolysis.  Patient verbalized understanding.  Provided patient with medication education material and answered all questions.    Medication Samples have been provided to the patient.  Drug name: Mitigare       Strength: 0.6 mg      Qty: 14 capsules   LOT: [id number] and [id number]   Exp.Date: 11/2019 and 03/2020  Dosing instructions: Take 2 capsules daily as needed for gout flares   A prescription for Probenecid 500 mg three times daily will be sent to mail order pharmacy for 90 day supply pending lab results.    All questions encouraged and answered.  Instructed patient to call with any questions or concerns.  Mariella Saa, PharmD, [place], [place] Clinical Specialty Pharmacist [phone]  06/19/2019 10:06 AM

## 2019-06-20 LAB — CBC WITH DIFFERENTIAL/PLATELET
Absolute Monocytes: 657 cells/uL (ref 200–950)
Basophils Absolute: 37 cells/uL (ref 0–200)
Basophils Relative: 0.6 %
Eosinophils Absolute: 186 cells/uL (ref 15–500)
Eosinophils Relative: 3 %
HCT: 39.6 % (ref 35.0–45.0)
Hemoglobin: 13.2 g/dL (ref 11.7–15.5)
Lymphs Abs: 2455 cells/uL (ref 850–3900)
MCH: 30.3 pg (ref 27.0–33.0)
MCHC: 33.3 g/dL (ref 32.0–36.0)
MCV: 91 fL (ref 80.0–100.0)
MPV: 11.4 fL (ref 7.5–12.5)
Monocytes Relative: 10.6 %
Neutro Abs: 2864 cells/uL (ref 1500–7800)
Neutrophils Relative %: 46.2 %
Platelets: 202 10*3/uL (ref 140–400)
RBC: 4.35 10*6/uL (ref 3.80–5.10)
RDW: 13.1 % (ref 11.0–15.0)
Total Lymphocyte: 39.6 %
WBC: 6.2 10*3/uL (ref 3.8–10.8)

## 2019-06-20 LAB — COMPLETE METABOLIC PANEL WITH GFR
AG Ratio: 1.2 (calc) (ref 1.0–2.5)
ALT: 15 U/L (ref 6–29)
AST: 18 U/L (ref 10–35)
Albumin: 4.2 g/dL (ref 3.6–5.1)
Alkaline phosphatase (APISO): 68 U/L (ref 37–153)
BUN: 12 mg/dL (ref 7–25)
CO2: 27 mmol/L (ref 20–32)
Calcium: 10 mg/dL (ref 8.6–10.4)
Chloride: 104 mmol/L (ref 98–110)
Creat: 0.77 mg/dL (ref 0.60–0.93)
GFR, Est African American: 89 mL/min/{1.73_m2} (ref >=60)
GFR, Est Non African American: 77 mL/min/{1.73_m2} (ref >=60)
Globulin: 3.4 g/dL (calc) (ref 1.9–3.7)
Glucose, Bld: 79 mg/dL (ref 65–99)
Potassium: 4.2 mmol/L (ref 3.5–5.3)
Sodium: 143 mmol/L (ref 135–146)
Total Bilirubin: 0.4 mg/dL (ref 0.2–1.2)
Total Protein: 7.6 g/dL (ref 6.1–8.1)

## 2019-06-20 LAB — URIC ACID: Uric Acid, Serum: 4.1 mg/dL (ref 2.5–7.0)

## 2019-06-20 MED ORDER — PROBENECID 500 MG PO TABS: 500.0000 mg | ORAL_TABLET | Freq: Three times a day (TID) | ORAL | 0 refills | Status: DC

## 2019-06-20 NOTE — Telephone Encounter (Signed)
Prescriptions have been sent to the pharmacy. 

## 2019-06-20 NOTE — Telephone Encounter (Signed)
Labs within normal limits and uric acid within desirable range.  Prescription sent to mail order pharmacy per patient request.   Mariella Saa, PharmD, [place], [place] Clinical Specialty Pharmacist [phone]  06/20/2019 9:14 AM

## 2019-06-21 ENCOUNTER — Other Ambulatory Visit: Payer: Self-pay | Admitting: Emergency Medicine

## 2019-06-21 DIAGNOSIS — M19071 Primary osteoarthritis, right ankle and foot: Secondary | ICD-10-CM | POA: Insufficient documentation

## 2019-06-21 DIAGNOSIS — Z8739 Personal history of other diseases of the musculoskeletal system and connective tissue: Secondary | ICD-10-CM

## 2019-06-21 DIAGNOSIS — M19041 Primary osteoarthritis, right hand: Secondary | ICD-10-CM | POA: Insufficient documentation

## 2019-06-21 DIAGNOSIS — M19042 Primary osteoarthritis, left hand: Secondary | ICD-10-CM | POA: Insufficient documentation

## 2019-06-21 DIAGNOSIS — M170 Bilateral primary osteoarthritis of knee: Secondary | ICD-10-CM | POA: Insufficient documentation

## 2019-06-21 NOTE — Progress Notes (Signed)
Office Visit Note  Patient: Isabella Dixon             Date of Birth: [birth year]           MRN: [id number]             PCP: Horald Pollen, MD Referring: Horald Pollen, * Visit Date: 07/04/2019 Occupation: @GUAROCC @  Subjective:  Medication monitoring   History of Present Illness: Isabella Dixon is a 73 y.o. female with history of gout, osteoarthritis, and DDD. She is taking Probenecid 500 mg 1 tablet TID.  She only takes colchicine as needed during gout flares.  She denies any recent gout flares.  She states she has been raking and performing yardwork the last several days, which has exacerbated her lower back pain.  She denies any other joint pain or joint swelling at this time.    Activities of Daily Living:  Patient reports morning stiffness for   0 minutes.   Patient Denies nocturnal pain.  Difficulty dressing/grooming: Denies Difficulty climbing stairs: Denies Difficulty getting out of chair: Denies Difficulty using hands for taps, buttons, cutlery, and/or writing: Denies  Review of Systems  Constitutional: Negative for fatigue.  HENT: Negative for mouth sores, mouth dryness and nose dryness.   Eyes: Negative for pain, visual disturbance and dryness.  Respiratory: Negative for cough, hemoptysis, shortness of breath and difficulty breathing.   Cardiovascular: Negative for chest pain, palpitations, hypertension and swelling in legs/feet.  Gastrointestinal: Positive for diarrhea. Negative for blood in stool and constipation.  Endocrine: Negative for increased urination.  Genitourinary: Negative for painful urination.  Musculoskeletal: Positive for arthralgias and joint pain. Negative for joint swelling, myalgias, muscle weakness, morning stiffness, muscle tenderness and myalgias.  Skin: Negative for color change, pallor, rash, hair loss, nodules/bumps, skin tightness, ulcers and sensitivity to sunlight.  Allergic/Immunologic: Negative for  susceptible to infections.  Neurological: Negative for dizziness, numbness, headaches and weakness.  Hematological: Negative for swollen glands.  Psychiatric/Behavioral: Negative for depressed mood and sleep disturbance. The patient is not nervous/anxious.     PMFS History:  Patient Active Problem List   Diagnosis Date Noted  . Primary osteoarthritis of both hands 06/21/2019  . Primary osteoarthritis of both feet 06/21/2019  . Primary osteoarthritis of both knees 06/21/2019  . History of sciatica 09/06/2017  . Type 2 diabetes mellitus without complication, without long-term current use of insulin ([facility]) 09/06/2017  . History of gout 09/06/2017  . Diabetes mellitus type 2 in obese ([place]) 05/13/2015  . Hypothyroid 07/25/2012  . Gouty arthritis 05/02/2012  . Osteoarthritis 05/02/2012  . Hypertension 05/02/2012  . Hyperlipidemia 05/02/2012    Past Medical History:  Diagnosis Date  . Anemia   . Arthritis   . Diabetes mellitus without complication ([place])   . Gout   . High cholesterol   . Hypertension   . Osteoporosis     Family History  Problem Relation Age of Onset  . Heart disease Mother   . Stroke Mother   . Hypertension Mother   . Stroke Father   . Colon cancer Sister   . SIDS Son   . Stroke Paternal Grandmother   . Cancer Paternal Grandmother   . ALS Brother   . Stroke Maternal Grandmother   . Cancer Paternal Grandfather   . Testicular cancer Son   . Down syndrome Son   . Heart disease Brother    Past Surgical History:  Procedure Laterality Date  . ABDOMINAL HYSTERECTOMY    .  TUBAL LIGATION     Social History   Social History Narrative  . Not on file   Immunization History  Administered Date(s) Administered  . Fluad Quad(high Dose 65+) 05/02/2019  . Influenza Split 05/02/2012  . Influenza, High Dose Seasonal PF 06/15/2018  . Influenza,inj,Quad PF,6+ Mos 05/08/2013, 04/30/2014, 05/13/2015, 05/17/2016, 09/06/2017  . Pneumococcal Conjugate-13 05/13/2015  .  Pneumococcal Polysaccharide-23 11/01/2018  . Tdap 03/14/2008     Objective: Vital Signs: There were no vitals taken for this visit.   Physical Exam Vitals signs and nursing note reviewed.  Constitutional:      Appearance: She is well-developed.  HENT:     Head: Normocephalic and atraumatic.  Eyes:     Conjunctiva/sclera: Conjunctivae normal.  Neck:     Musculoskeletal: Normal range of motion.  Cardiovascular:     Rate and Rhythm: Normal rate and regular rhythm.     Heart sounds: Normal heart sounds.  Pulmonary:     Effort: Pulmonary effort is normal.     Breath sounds: Normal breath sounds.  Abdominal:     General: Bowel sounds are normal.     Palpations: Abdomen is soft.  Lymphadenopathy:     Cervical: No cervical adenopathy.  Skin:    General: Skin is warm and dry.     Capillary Refill: Capillary refill takes less than 2 seconds.  Neurological:     Mental Status: She is alert and oriented to person, place, and time.  Psychiatric:        Behavior: Behavior normal.      Musculoskeletal Exam: C-spine, thoracic spine, and lumbar spine good ROM.  No midline spinal tenderness.  No SI joint tenderness.  Shoulder joints, elbow joints, wrist joints, MCPs, PIPs, and DIPs good ROM with no synovitis.  Severe PIP and DIP synovial thickening consistent with osteoarthritis of both hands.  CMC joint synovial thickening bilaterally.   Hip joints, knee joints, ankle joints, MTPs, PIPs, and DIPs good ROM with no synovitis.  No warmth or effusion of knee joints.  No tenderness or swelling of ankle joints.   CDAI Exam: CDAI Score: - Patient Global: -; Provider Global: - Swollen: -; Tender: - Joint Exam   No joint exam has been documented for this visit   There is currently no information documented on the homunculus. Go to the Rheumatology activity and complete the homunculus joint exam.  Investigation: No additional findings.  Imaging: Xr Foot 2 Views Left  Result Date:  06/19/2019 First MTP, PIP and DIP narrowing was noted.  No erosive changes were noted.  No intertarsal joint space narrowing was noted.  Dorsal spurring was noted.  No tibiotalar joint space narrowing was noted.  Small posterior inferior calcaneal spurs were noted. Impression: These findings are consistent with osteoarthritis of the foot.  Xr Foot 2 Views Right  Result Date: 06/19/2019 First MTP, PIP and DIP narrowing was noted.  No erosive changes were noted.  Dorsal spurring was noted.  Inferior and posterior calcaneal spurs were noted. Impression: These findings are consistent with osteoarthritis of the foot.  Xr Hand 2 View Left  Result Date: 06/19/2019 Severe PIP and DIP narrowing was noted.  Severe CMC joint space narrowing was noted.  No MCP, intercarpal radiocarpal joint space narrowing was noted.  No erosive changes were noted. Impression: These findings are consistent with severe osteoarthritis of the hand.  Xr Hand 2 View Right  Result Date: 06/19/2019 Severe PIP and DIP joint space narrowing was noted.  Mild CMC joint  narrowing was noted.  No intercarpal radiocarpal joint space narrowing was noted.  No MCP involvement was noted. Impression: These findings are consistent with severe osteoarthritis of the hand.  Xr Knee 3 View Left  Result Date: 06/19/2019 Severe medial compartment narrowing with intercondylar osteophytes was noted.  No chondrocalcinosis was noted.  Severe patellofemoral narrowing was noted. Impression: These findings are consistent with severe osteoarthritis and severe chondromalacia patella.  Xr Knee 3 View Right  Result Date: 06/19/2019 Severe medial compartment narrowing was noted.  No chondrocalcinosis was noted.  Severe patellofemoral narrowing was noted. Impression: These findings are consistent with severe osteoarthritis and severe chondromalacia patella.   Recent Labs: Lab Results  Component Value Date   WBC 6.2 06/19/2019   HGB 13.2 06/19/2019    PLT 202 06/19/2019   NA 143 06/19/2019   K 4.2 06/19/2019   CL 104 06/19/2019   CO2 27 06/19/2019   GLUCOSE 79 06/19/2019   BUN 12 06/19/2019   CREATININE 0.77 06/19/2019   BILITOT 0.4 06/19/2019   ALKPHOS 80 11/01/2018   AST 18 06/19/2019   ALT 15 06/19/2019   PROT 7.6 06/19/2019   ALBUMIN 4.3 11/01/2018   CALCIUM 10.0 06/19/2019   GFRAA 89 06/19/2019  Of 12/18/2018 CBC normal, CMP normal, uric acid4.1  Speciality Comments: No specialty comments available.  Procedures:  No procedures performed Allergies: Patient has no known allergies.   Assessment / Plan:     Visit Diagnoses: History of gout - Treated with probenecid 500 mg 3 times daily by Dr. Kellie Simmeringruslow for many years.  Allopurinol caused diarrhea.  She uses colchicine as needed.   She has not had any recent gout flares. She is taking Probenecid 500 mg 1 tablet TID.  She has not needed to take colchicine recently but does have a prescription on hand if needed. If she develops a flare she was advised to take colchicine 0.6 mg BID.  We discussed the importance of following a low purine diet.  She was advised to notify us if she develops signs or symptoms of a flare.  Her uric acid was 4.1 on 06/19/19.  She will follow up in 6 months and we will recheck labs at that time.   Primary osteoarthritis of both hands - Severe osteoarthritis of bilateral hands: She has severe PIP and DIP synovial thickening consistent with osteoarthritis of both hands.  CMC joint synovial thickening bilaterally.  No synovitis or tenderness noted.  Joint protection and muscle strengthening were discussed.  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  No warmth or effusion of knee joints noted.   Primary osteoarthritis of both feet: She has no feet pain or joint swelling at this time.  DDD (degenerative disc disease), lumbar: She is having increased  lower back pain exacerbated by raking and performing yard work the past several days.   Other medical conditions are listed as follows:   Postmenopausal - DXA was ordered at the last visit.  Essential hypertension  Type 2 diabetes mellitus without complication, without long-term current use of insulin ([facility])  History of hyperlipidemia  History of hypothyroidism  Orders: No orders of the defined types were placed in this encounter.  No orders of the defined types were placed in this encounter.   Follow-Up Instructions: Return in about 6 months (around 01/01/2020) for Gout, Osteoarthritis, DDD.   Gearldine Bienenstockaylor M Nyriah Coote, PA-C   I examined and evaluated  the patient with Sherron Ales PA.  Patient is doing well on probenecid.  She had no synovitis on examination today.  Her uric acid is in desirable range.  She has some underlying osteoarthritis which causes discomfort.  She will use colchicine only on the as needed basis.  The plan of care was discussed as noted above.  Pollyann Savoy, MD  Note - This record has been created using Animal nutritionist.  Chart creation errors have been sought, but may not always  have been located. Such creation errors do not reflect on  the standard of medical care.

## 2019-06-22 ENCOUNTER — Telehealth: Payer: Self-pay

## 2019-06-22 ENCOUNTER — Telehealth: Payer: Self-pay | Admitting: Rheumatology

## 2019-06-22 MED ORDER — MITIGARE 0.6 MG PO CAPS: 2.0000 | ORAL_CAPSULE | Freq: Every day | ORAL | 2 refills | Status: DC | PRN

## 2019-06-22 NOTE — Telephone Encounter (Signed)
Dr. Arlean Hopping office called requesting Dr. Mitchel Honour order a Bone Density scan for the pt. As Dr. Estanislado Pandy is unable to order the test due to conflicts of interests

## 2019-06-22 NOTE — Telephone Encounter (Signed)
Patient called stating Dr. Estanislado Pandy sent prescription of Colchicine to [facility].  Patient states her insurance won't pay for the generic brand.  Patient states [facility] is requesting a new prescription for name brand.  Patient requested a return call.

## 2019-06-22 NOTE — Telephone Encounter (Signed)
Resent in prescription for brand name. Patient advised.

## 2019-07-03 ENCOUNTER — Telehealth: Payer: Self-pay | Admitting: Emergency Medicine

## 2019-07-03 NOTE — Telephone Encounter (Signed)
Copied from [place] [phone]. Topic: Referral - Request for Referral >> Jul 02, 2019  3:33 PM Erick Blinks wrote: Has patient seen PCP for this complaint? YEs *If NO, is insurance requiring patient see PCP for this issue before PCP can refer them? Referral for which specialty: Bone Density  [facility]  Reason for referral: Dr. Patrecia Pour  [place] called)

## 2019-07-04 ENCOUNTER — Other Ambulatory Visit: Payer: Self-pay

## 2019-07-04 ENCOUNTER — Other Ambulatory Visit: Payer: Self-pay | Admitting: *Deleted

## 2019-07-04 ENCOUNTER — Ambulatory Visit (INDEPENDENT_AMBULATORY_CARE_PROVIDER_SITE_OTHER): Payer: Medicare Other | Admitting: Rheumatology

## 2019-07-04 ENCOUNTER — Encounter: Payer: Self-pay | Admitting: Rheumatology

## 2019-07-04 VITALS — BP 134/74 | HR 70 | Resp 16 | Ht 62.5 in | Wt 186.4 lb

## 2019-07-04 DIAGNOSIS — M5136 Other intervertebral disc degeneration, lumbar region: Secondary | ICD-10-CM

## 2019-07-04 DIAGNOSIS — Z8639 Personal history of other endocrine, nutritional and metabolic disease: Secondary | ICD-10-CM

## 2019-07-04 DIAGNOSIS — M19041 Primary osteoarthritis, right hand: Secondary | ICD-10-CM | POA: Diagnosis not present

## 2019-07-04 DIAGNOSIS — Z8739 Personal history of other diseases of the musculoskeletal system and connective tissue: Secondary | ICD-10-CM | POA: Diagnosis not present

## 2019-07-04 DIAGNOSIS — I10 Essential (primary) hypertension: Secondary | ICD-10-CM

## 2019-07-04 DIAGNOSIS — M159 Polyosteoarthritis, unspecified: Secondary | ICD-10-CM

## 2019-07-04 DIAGNOSIS — M170 Bilateral primary osteoarthritis of knee: Secondary | ICD-10-CM

## 2019-07-04 DIAGNOSIS — M19071 Primary osteoarthritis, right ankle and foot: Secondary | ICD-10-CM

## 2019-07-04 DIAGNOSIS — E119 Type 2 diabetes mellitus without complications: Secondary | ICD-10-CM

## 2019-07-04 DIAGNOSIS — M19042 Primary osteoarthritis, left hand: Secondary | ICD-10-CM

## 2019-07-04 DIAGNOSIS — M19072 Primary osteoarthritis, left ankle and foot: Secondary | ICD-10-CM

## 2019-07-04 DIAGNOSIS — Z780 Asymptomatic menopausal state: Secondary | ICD-10-CM

## 2019-07-05 ENCOUNTER — Other Ambulatory Visit: Payer: Self-pay | Admitting: Emergency Medicine

## 2019-07-05 DIAGNOSIS — E2839 Other primary ovarian failure: Secondary | ICD-10-CM

## 2019-07-05 DIAGNOSIS — Z1231 Encounter for screening mammogram for malignant neoplasm of breast: Secondary | ICD-10-CM

## 2019-08-24 ENCOUNTER — Other Ambulatory Visit: Payer: Self-pay | Admitting: Emergency Medicine

## 2019-08-24 DIAGNOSIS — Z8739 Personal history of other diseases of the musculoskeletal system and connective tissue: Secondary | ICD-10-CM

## 2019-09-28 ENCOUNTER — Ambulatory Visit
Admission: RE | Admit: 2019-09-28 | Discharge: 2019-09-28 | Disposition: A | Discharge status: Home / Self Care | Payer: Medicare Other | Source: Ambulatory Visit | Attending: Emergency Medicine | Admitting: Emergency Medicine

## 2019-09-28 ENCOUNTER — Other Ambulatory Visit: Payer: Self-pay

## 2019-09-28 DIAGNOSIS — E2839 Other primary ovarian failure: Secondary | ICD-10-CM

## 2019-09-28 DIAGNOSIS — Z1231 Encounter for screening mammogram for malignant neoplasm of breast: Secondary | ICD-10-CM

## 2019-10-31 ENCOUNTER — Encounter: Payer: Self-pay | Admitting: Emergency Medicine

## 2019-10-31 ENCOUNTER — Ambulatory Visit (INDEPENDENT_AMBULATORY_CARE_PROVIDER_SITE_OTHER): Payer: Medicare Other | Admitting: Emergency Medicine

## 2019-10-31 ENCOUNTER — Other Ambulatory Visit: Payer: Self-pay

## 2019-10-31 VITALS — BP 110/76 | HR 63 | Temp 97.9°F | Ht 62.5 in | Wt 179.0 lb

## 2019-10-31 DIAGNOSIS — Z8669 Personal history of other diseases of the nervous system and sense organs: Secondary | ICD-10-CM

## 2019-10-31 DIAGNOSIS — I10 Essential (primary) hypertension: Secondary | ICD-10-CM

## 2019-10-31 DIAGNOSIS — E038 Other specified hypothyroidism: Secondary | ICD-10-CM

## 2019-10-31 DIAGNOSIS — E1159 Type 2 diabetes mellitus with other circulatory complications: Secondary | ICD-10-CM | POA: Diagnosis not present

## 2019-10-31 DIAGNOSIS — Z8739 Personal history of other diseases of the musculoskeletal system and connective tissue: Secondary | ICD-10-CM | POA: Diagnosis not present

## 2019-10-31 DIAGNOSIS — E119 Type 2 diabetes mellitus without complications: Secondary | ICD-10-CM

## 2019-10-31 DIAGNOSIS — E1169 Type 2 diabetes mellitus with other specified complication: Secondary | ICD-10-CM | POA: Diagnosis not present

## 2019-10-31 DIAGNOSIS — E785 Hyperlipidemia, unspecified: Secondary | ICD-10-CM

## 2019-10-31 DIAGNOSIS — M159 Polyosteoarthritis, unspecified: Secondary | ICD-10-CM

## 2019-10-31 DIAGNOSIS — I152 Hypertension secondary to endocrine disorders: Secondary | ICD-10-CM

## 2019-10-31 MED ORDER — METOPROLOL TARTRATE 25 MG PO TABS: 25.0000 mg | ORAL_TABLET | Freq: Two times a day (BID) | ORAL | 3 refills | Status: DC

## 2019-10-31 MED ORDER — ROSUVASTATIN CALCIUM 10 MG PO TABS: ORAL_TABLET | ORAL | 3 refills | Status: DC

## 2019-10-31 MED ORDER — LOSARTAN POTASSIUM 100 MG PO TABS: ORAL_TABLET | ORAL | 3 refills | Status: DC

## 2019-10-31 MED ORDER — LEVOTHYROXINE SODIUM 75 MCG PO TABS: 75.0000 ug | ORAL_TABLET | Freq: Every day | ORAL | 3 refills | Status: DC

## 2019-10-31 MED ORDER — GABAPENTIN 100 MG PO CAPS: 200.0000 mg | ORAL_CAPSULE | Freq: Every day | ORAL | 3 refills | Status: DC

## 2019-10-31 MED ORDER — METFORMIN HCL 500 MG PO TABS: 500.0000 mg | ORAL_TABLET | Freq: Two times a day (BID) | ORAL | 3 refills | Status: DC

## 2019-10-31 NOTE — Progress Notes (Signed)
Assessment / Plan:     Visit Diagnoses: Gouty arthritis -patient has longstanding history of gout.  She states she was initially treated with allopurinol and colchicine.  She states allopurinol was discontinued due to severe diarrhea.  She states she could tolerate colchicine but has taken it only on as needed basis in the past.  Her gout has been very well controlled on probenecid by Dr. Kellie Dixon.  Her uric acid was always between 4 and 5.  She states she had no flares until her probenecid was reduced to twice daily dosing.  She wants to stay on probenecid 3 times daily dosing.  I detailed discussion with the patient.  She is not interested in changing her medication.  I discussed with her if her GFR is normal then she can continue to take probenecid 3 times daily.  She should use colchicine on as needed basis for gout flares.  I will call in the prescriptions once we have lab results available.- Plan: Uric acid  Medication management - Plan: CBC with Differential/Platelet, COMPLETE METABOLIC PANEL WITH GFR  Pain in both hands -she has DIP and PIP thickening with no synovitis.  Plan: XR Hand 2 View Right, XR Hand 2 View Left  Chronic pain of both knees -she has chronic pain and discomfort in her knee joints but no warmth swelling or effusion was noted.  Plan: XR KNEE 3 VIEW RIGHT, XR KNEE 3 VIEW LEFT.  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  Pain in both feet -she has chronic discomfort in her feet but no active synovitis today.  Plan: XR Foot 2 Views Right, XR Foot 2 Views Left.  X-rays were consistent with osteoarthritis of bilateral feet.  DDD (degenerative disc disease), lumbar-patient has history of degenerative disc disease with intermittent sciatica.  Currently she is asymptomatic.  Postmenopausal-patient states she has not had bone density in many years.  She would like for me to schedule a DEXA scan.  Type 2 diabetes mellitus without  complication, without long-term current use of insulin (HCC)  History of hypothyroidism  Essential hypertension-her blood pressures are still elevated.  Have advised her to follow blood pressure closely and discussed with her PCP.  History of hyperlipidemia  Isabella Dixon 74 y.o.   Chief Complaint  Patient presents with  . Hypertension    medication refills / new pharm ins  . Hyperlipidemia    HISTORY OF PRESENT ILLNESS: This is a 74 y.o. female with multiple medical problems here for follow-up: 1.  Hypertension: On losartan and metoprolol BP Readings from Last 3 Encounters:  07/04/19 134/74  06/19/19 (!) 177/85  05/02/19 (!) 150/83  2.  History of gout and osteoarthritis: On probenecid 500 mg 3 times a day. 3.  Dyslipidemia: On Crestor 10 mg daily. 4.  Hypothyroidism: On Synthroid 75 mcg daily. 5.  Diabetes type 2: On Metformin 500 mg twice a day Lab Results  Component Value Date   HGBA1C 6.0 (H) 11/01/2018   Saw rheumatologist Dr. Corliss Dixon last October.  Office visit notes reviewed. Also spoke to pharmacist who advised her about her medications and touched on the subject of rhabdomyolysis and kidney disease.   HPI   Prior to Admission medications   Medication Sig Start Date End Date Taking? Authorizing Provider  chlorpheniramine (CHLOR-TRIMETON) 4 MG tablet Take 4 mg by mouth 2 (two) times daily as needed for allergies.   Isabella Provider, Historical, Dixon  Cholecalciferol (VITAMIN D-3 PO) Take 2,000 Units by mouth daily.  Isabella Provider, Historical, Dixon  gabapentin (NEURONTIN) 100 MG capsule Take 2 capsules (200 mg total) by mouth at bedtime. 05/02/19 10/31/19 Isabella Isabella Dixon  IBUPROFEN PO Take by mouth as needed.   Isabella Provider, Historical, Dixon  losartan (COZAAR) 100 MG tablet TAKE 1 TABLET DAILY 11/01/18  Isabella Dixon  Magnesium 250 MG TABS Take by mouth daily.   Isabella Provider, Historical, Dixon  Multiple Vitamin (MULTIVITAMIN) tablet  Take 1 tablet by mouth daily.   Isabella Provider, Historical, Dixon  Naproxen Sodium (ALEVE PO) Take by mouth.   Isabella Provider, Historical, Dixon  OVER THE COUNTER MEDICATION OTC Vit D 1400 mg daily   Isabella Provider, Historical, Dixon  probenecid (BENEMID) 500 MG tablet TAKE 1 TABLET BY MOUTH TWICE A DAY 08/24/19  Isabella Isabella Dixon, Isabella Rouse, Dixon  rosuvastatin (CRESTOR) 10 MG tablet TAKE 1 TABLET DAILY 11/01/18  Isabella Dixon  traMADol (ULTRAM) 50 MG tablet Take 1 tablet (50 mg total) by mouth every 8 (eight) hours as needed. 06/15/18  Isabella Dixon  vitamin C (ASCORBIC ACID) 250 MG tablet Take 250 mg by mouth daily.   Isabella Provider, Historical, Dixon  Colchicine 0.6 MG CAPS Take 1.8 mg by mouth daily as needed (for gout flares). Patient not taking: Reported on 10/31/2019 06/19/19   Isabella Dixon  levothyroxine (SYNTHROID, LEVOTHROID) 75 MCG tablet Take 1 tablet (75 mcg total) by mouth daily. 11/01/18 06/19/19  Isabella Dixon  metFORMIN (GLUCOPHAGE) 500 MG tablet Take 1 tablet (500 mg total) by mouth 2 (two) times daily with a meal. 11/01/18 06/19/19  Isabella Dixon, Isabella Kempf, Dixon  metoprolol tartrate (LOPRESSOR) 25 MG tablet Take 1 tablet (25 mg total) by mouth 2 (two) times daily. 11/01/18 06/19/19  Isabella Dixon  MITIGARE 0.6 MG CAPS Take 2 capsules by mouth daily as needed. Patient not taking: Reported on 10/31/2019 06/22/19   Isabella Dixon  aspirin 81 MG tablet   10/31/19  Provider, Historical, Dixon    No Known Allergies  Patient Active Problem List   Diagnosis Date Noted  . Primary osteoarthritis of both hands 06/21/2019  . Primary osteoarthritis of both feet 06/21/2019  . Primary osteoarthritis of both knees 06/21/2019  . History of sciatica 09/06/2017  . Type 2 diabetes mellitus without complication, without long-term current use of insulin (HCC) 09/06/2017  . History of gout 09/06/2017  . Diabetes mellitus type 2 in obese (HCC) 05/13/2015  .  Hypothyroid 07/25/2012  . Gouty arthritis 05/02/2012  . Osteoarthritis 05/02/2012  . Hypertension 05/02/2012  . Hyperlipidemia 05/02/2012    Past Medical History:  Diagnosis Date  . Anemia   . Arthritis   . Diabetes mellitus without complication (HCC)   . Gout   . High cholesterol   . Hypertension   . Osteoporosis     Past Surgical History:  Procedure Laterality Date  . ABDOMINAL HYSTERECTOMY    . TUBAL LIGATION      Social History   Socioeconomic History  . Marital status: Married    Spouse name: Not on file  . Number of children: Not on file  . Years of education: Not on file  . Highest education level: Not on file  Occupational History  . Not on file  Tobacco Use  . Smoking status: Never Smoker  . Smokeless tobacco: Never Used  Substance and Sexual Activity  . Alcohol use: No  . Drug use: No  . Sexual activity:  Isabella  Other Topics Concern  . Not on file  Social History Narrative  . Not on file   Social Determinants of Health   Financial Resource Strain:   . Difficulty of Paying Living Expenses: Not on file  Food Insecurity:   . Worried About Programme researcher, broadcasting/film/videounning Out of Food in the Last Year: Not on file  . Ran Out of Food in the Last Year: Not on file  Transportation Needs:   . Lack of Transportation (Medical): Not on file  . Lack of Transportation (Non-Medical): Not on file  Physical Activity:   . Days of Exercise per Week: Not on file  . Minutes of Exercise per Session: Not on file  Stress:   . Feeling of Stress : Not on file  Social Connections:   . Frequency of Communication with Friends and Family: Not on file  . Frequency of Social Gatherings with Friends and Family: Not on file  . Attends Religious Services: Not on file  . Active Member of Clubs or Organizations: Not on file  . Attends BankerClub or Organization Meetings: Not on file  . Marital Status: Not on file  Intimate Partner Violence:   . Fear of Current or Ex-Partner: Not on file  . Emotionally  Abused: Not on file  . Physically Abused: Not on file  . Sexually Abused: Not on file    Family History  Problem Relation Age of Onset  . Heart disease Mother   . Stroke Mother   . Hypertension Mother   . Stroke Father   . Colon cancer Sister   . SIDS Son   . Stroke Paternal Grandmother   . Cancer Paternal Grandmother   . ALS Brother   . Stroke Maternal Grandmother   . Cancer Paternal Grandfather   . Testicular cancer Son   . Down syndrome Son   . Heart disease Brother      Review of Systems  Constitutional: Negative.  Negative for chills and fever.  HENT: Negative.  Negative for congestion and sore throat.   Respiratory: Negative.  Negative for cough and shortness of breath.   Cardiovascular: Negative.  Negative for chest pain and palpitations.  Gastrointestinal: Negative.  Negative for abdominal pain, blood in stool, melena, nausea and vomiting.  Genitourinary: Negative.  Negative for dysuria and hematuria.  Musculoskeletal: Positive for joint pain. Negative for back pain, myalgias and neck pain.  Skin: Negative.  Negative for rash.  Neurological: Negative.  Negative for dizziness and headaches.  All other systems reviewed and are negative.  Today's Vitals   10/31/19 0838 10/31/19 0847  BP: (!) 153/74 110/76  Pulse: 63   Temp: 97.9 F (36.6 C)   TempSrc: Temporal   SpO2: 98%   Weight: 179 lb (81.2 kg)   Height: 5' 2.5" (1.588 m)    Body mass index is 32.22 kg/m.   Physical Exam Vitals reviewed.  Constitutional:      Appearance: Normal appearance.  HENT:     Head: Normocephalic.  Eyes:     Extraocular Movements: Extraocular movements intact.     Conjunctiva/sclera: Conjunctivae normal.     Pupils: Pupils are equal, round, and reactive to light.  Cardiovascular:     Rate and Rhythm: Normal rate and regular rhythm.     Pulses: Normal pulses.     Heart sounds: Normal heart sounds.  Pulmonary:     Effort: Pulmonary effort is normal.     Breath sounds:  Normal breath sounds.  Musculoskeletal:  General: Normal range of motion.     Cervical back: Normal range of motion and neck supple.  Skin:    General: Skin is warm and dry.     Capillary Refill: Capillary refill takes less than 2 seconds.  Neurological:     General: No focal deficit present.     Mental Status: She is alert and oriented to person, place, and time.  Psychiatric:        Mood and Affect: Mood normal.        Behavior: Behavior normal.    A total of 30 minutes was spent with the patient, greater than 50% of which was in counseling/coordination of care regarding chronic medical problems, review of most recent office visit notes, review of most recent blood work results, review of all medications, diet and nutrition, prognosis and need for follow-up.   ASSESSMENT & PLAN: Raveen was seen today for hypertension and hyperlipidemia.  Diagnoses and all orders for this visit:  Hypertension associated with diabetes (HCC) -     Hemoglobin A1c -     Comprehensive metabolic panel  Dyslipidemia associated with type 2 diabetes mellitus (HCC) -     Lipid panel -     HM Diabetes Foot Exam  History of gout  Other specified hypothyroidism -     levothyroxine (SYNTHROID) 75 MCG tablet; Take 1 tablet (75 mcg total) by mouth daily.  Osteoarthritis of multiple joints, unspecified osteoarthritis type  Hyperlipidemia, unspecified hyperlipidemia type -     rosuvastatin (CRESTOR) 10 MG tablet; TAKE 1 TABLET DAILY  Type 2 diabetes mellitus without complication, without long-term current use of insulin (HCC) -     metFORMIN (GLUCOPHAGE) 500 MG tablet; Take 1 tablet (500 mg total) by mouth 2 (two) times daily with a meal.  Essential hypertension -     metoprolol tartrate (LOPRESSOR) 25 MG tablet; Take 1 tablet (25 mg total) by mouth 2 (two) times daily. -     losartan (COZAAR) 100 MG tablet; TAKE 1 TABLET DAILY  History of sciatica -     gabapentin (NEURONTIN) 100 MG capsule;  Take 2 capsules (200 mg total) by mouth at bedtime.  Other orders -     Cancel: POCT glucose (manual entry)    Patient Instructions       If you have lab work done today you will be contacted with your lab results within the next 2 weeks.  If you have not heard from [place] then please contact us. The fastest way to get your results is to register for My Chart.   IF you received an x-ray today, you will receive an invoice from [facility]. Please contact [facility] at [phone] with questions or concerns regarding your invoice.   IF you received labwork today, you will receive an invoice from [facility]. Please contact [facility] at [phone] with questions or concerns regarding your invoice.   Our billing staff will not be able to assist you with questions regarding bills from these companies.  You will be contacted with the lab results as soon as they are available. The fastest way to get your results is to activate your My Chart account. Instructions are located on the last page of this paperwork. If you have not heard from [place] regarding the results in 2 weeks, please contact this office.       Health Maintenance After Age 82 After age 47, you are at a higher risk for certain long-term diseases and infections as well as injuries  from falls. Falls are a major cause of broken bones and head injuries in people who are older than age 77. Getting regular preventive care can help to keep you healthy and well. Preventive care includes getting regular testing and making lifestyle changes as recommended by your health care provider. Talk with your health care provider about:  Which screenings and tests you should have. A screening is a test that checks for a disease when you have no symptoms.  A diet and exercise plan that is right for you. What should I know about screenings and tests to prevent falls? Screening and testing are the best ways to find a health problem  early. Early diagnosis and treatment give you the best chance of managing medical conditions that are common after age 52. Certain conditions and lifestyle choices may make you more likely to have a fall. Your health care provider may recommend:  Regular vision checks. Poor vision and conditions such as cataracts can make you more likely to have a fall. If you wear glasses, make sure to get your prescription updated if your vision changes.  Medicine review. Work with your health care provider to regularly review all of the medicines you are taking, including over-the-counter medicines. Ask your health care provider about any side effects that may make you more likely to have a fall. Tell your health care provider if any medicines that you take make you feel dizzy or sleepy.  Osteoporosis screening. Osteoporosis is a condition that causes the bones to get weaker. This can make the bones weak and cause them to break more easily.  Blood pressure screening. Blood pressure changes and medicines to control blood pressure can make you feel dizzy.  Strength and balance checks. Your health care provider may recommend certain tests to check your strength and balance while standing, walking, or changing positions.  Foot health exam. Foot pain and numbness, as well as not wearing proper footwear, can make you more likely to have a fall.  Depression screening. You may be more likely to have a fall if you have a fear of falling, feel emotionally low, or feel unable to do activities that you used to do.  Alcohol use screening. Using too much alcohol can affect your balance and may make you more likely to have a fall. What actions can I take to lower my risk of falls? General instructions  Talk with your health care provider about your risks for falling. Tell your health care provider if: ? You fall. Be sure to tell your health care provider about all falls, even ones that seem minor. ? You feel dizzy, sleepy,  or off-balance.  Take over-the-counter and prescription medicines only as told by your health care provider. These include any supplements.  Eat a healthy diet and maintain a healthy weight. A healthy diet includes low-fat dairy products, low-fat (lean) meats, and fiber from whole grains, beans, and lots of fruits and vegetables. Home safety  Remove any tripping hazards, such as rugs, cords, and clutter.  Install safety equipment such as grab bars in bathrooms and safety rails on stairs.  Keep rooms and walkways well-lit. Activity   Follow a regular exercise program to stay fit. This will help you maintain your balance. Ask your health care provider what types of exercise are appropriate for you.  If you need a cane or walker, use it as recommended by your health care provider.  Wear supportive shoes that have nonskid soles. Lifestyle  Do not drink  alcohol if your health care provider tells you not to drink.  If you drink alcohol, limit how much you have: ? 0-1 drink a day for women. ? 0-2 drinks a day for men.  Be aware of how much alcohol is in your drink. In the [place] one drink equals one typical bottle of beer (12 oz), one-half glass of wine (5 oz), or one shot of hard liquor (1 oz).  Do not use any products that contain nicotine or tobacco, such as cigarettes and e-cigarettes. If you need help quitting, ask your health care provider. Summary  Having a healthy lifestyle and getting preventive care can help to protect your health and wellness after age 33.  Screening and testing are the best way to find a health problem early and help you avoid having a fall. Early diagnosis and treatment give you the best chance for managing medical conditions that are more common for people who are older than age 38.  Falls are a major cause of broken bones and head injuries in people who are older than age 15. Take precautions to prevent a fall at home.  Work with your health care  provider to learn what changes you can make to improve your health and wellness and to prevent falls. This information is not intended to replace advice given to you by your health care provider. Make sure you discuss any questions you have with your health care provider. Document Revised: 11/30/2018 Document Reviewed: 06/22/2017 Elsevier Patient Education  2020 Elsevier Inc.      Edwina Barth, [facility]

## 2019-10-31 NOTE — Patient Instructions (Addendum)
   If you have lab work done today you will be contacted with your lab results within the next 2 weeks.  If you have not heard from us then please contact us. The fastest way to get your results is to register for My Chart.   IF you received an x-ray today, you will receive an invoice from [facility]. Please contact [facility] at [phone] with questions or concerns regarding your invoice.   IF you received labwork today, you will receive an invoice from [facility]. Please contact [facility] at [phone] with questions or concerns regarding your invoice.   Our billing staff will not be able to assist you with questions regarding bills from these companies.  You will be contacted with the lab results as soon as they are available. The fastest way to get your results is to activate your My Chart account. Instructions are located on the last page of this paperwork. If you have not heard from us regarding the results in 2 weeks, please contact this office.     Health Maintenance After Age 65 After age 65, you are at a higher risk for certain long-term diseases and infections as well as injuries from falls. Falls are a major cause of broken bones and head injuries in people who are older than age 65. Getting regular preventive care can help to keep you healthy and well. Preventive care includes getting regular testing and making lifestyle changes as recommended by your health care provider. Talk with your health care provider about:  Which screenings and tests you should have. A screening is a test that checks for a disease when you have no symptoms.  A diet and exercise plan that is right for you. What should I know about screenings and tests to prevent falls? Screening and testing are the best ways to find a health problem early. Early diagnosis and treatment give you the best chance of managing medical conditions that are common after age 65. Certain conditions and  lifestyle choices may make you more likely to have a fall. Your health care provider may recommend:  Regular vision checks. Poor vision and conditions such as cataracts can make you more likely to have a fall. If you wear glasses, make sure to get your prescription updated if your vision changes.  Medicine review. Work with your health care provider to regularly review all of the medicines you are taking, including over-the-counter medicines. Ask your health care provider about any side effects that may make you more likely to have a fall. Tell your health care provider if any medicines that you take make you feel dizzy or sleepy.  Osteoporosis screening. Osteoporosis is a condition that causes the bones to get weaker. This can make the bones weak and cause them to break more easily.  Blood pressure screening. Blood pressure changes and medicines to control blood pressure can make you feel dizzy.  Strength and balance checks. Your health care provider may recommend certain tests to check your strength and balance while standing, walking, or changing positions.  Foot health exam. Foot pain and numbness, as well as not wearing proper footwear, can make you more likely to have a fall.  Depression screening. You may be more likely to have a fall if you have a fear of falling, feel emotionally low, or feel unable to do activities that you used to do.  Alcohol use screening. Using too much alcohol can affect your balance and may make you more likely to   have a fall. What actions can I take to lower my risk of falls? General instructions  Talk with your health care provider about your risks for falling. Tell your health care provider if: ? You fall. Be sure to tell your health care provider about all falls, even ones that seem minor. ? You feel dizzy, sleepy, or off-balance.  Take over-the-counter and prescription medicines only as told by your health care provider. These include any  supplements.  Eat a healthy diet and maintain a healthy weight. A healthy diet includes low-fat dairy products, low-fat (lean) meats, and fiber from whole grains, beans, and lots of fruits and vegetables. Home safety  Remove any tripping hazards, such as rugs, cords, and clutter.  Install safety equipment such as grab bars in bathrooms and safety rails on stairs.  Keep rooms and walkways well-lit. Activity   Follow a regular exercise program to stay fit. This will help you maintain your balance. Ask your health care provider what types of exercise are appropriate for you.  If you need a cane or walker, use it as recommended by your health care provider.  Wear supportive shoes that have nonskid soles. Lifestyle  Do not drink alcohol if your health care provider tells you not to drink.  If you drink alcohol, limit how much you have: ? 0-1 drink a day for women. ? 0-2 drinks a day for men.  Be aware of how much alcohol is in your drink. In the [place] one drink equals one typical bottle of beer (12 oz), one-half glass of wine (5 oz), or one shot of hard liquor (1 oz).  Do not use any products that contain nicotine or tobacco, such as cigarettes and e-cigarettes. If you need help quitting, ask your health care provider. Summary  Having a healthy lifestyle and getting preventive care can help to protect your health and wellness after age 65.  Screening and testing are the best way to find a health problem early and help you avoid having a fall. Early diagnosis and treatment give you the best chance for managing medical conditions that are more common for people who are older than age 65.  Falls are a major cause of broken bones and head injuries in people who are older than age 65. Take precautions to prevent a fall at home.  Work with your health care provider to learn what changes you can make to improve your health and wellness and to prevent falls. This information is not intended  to replace advice given to you by your health care provider. Make sure you discuss any questions you have with your health care provider. Document Revised: 11/30/2018 Document Reviewed: 06/22/2017 Elsevier Patient Education  2020 Elsevier Inc.  

## 2019-11-01 LAB — COMPREHENSIVE METABOLIC PANEL
ALT: 16 IU/L (ref 0–32)
AST: 19 IU/L (ref 0–40)
Albumin/Globulin Ratio: 1.3 (ref 1.2–2.2)
Albumin: 4.4 g/dL (ref 3.7–4.7)
Alkaline Phosphatase: 96 IU/L (ref 39–117)
BUN/Creatinine Ratio: 16 (ref 12–28)
BUN: 13 mg/dL (ref 8–27)
Bilirubin Total: 0.4 mg/dL (ref 0.0–1.2)
CO2: 23 mmol/L (ref 20–29)
Calcium: 9.9 mg/dL (ref 8.7–10.3)
Chloride: 99 mmol/L (ref 96–106)
Creatinine, Ser: 0.8 mg/dL (ref 0.57–1.00)
GFR calc Af Amer: 85 mL/min/{1.73_m2} (ref >59)
GFR calc non Af Amer: 74 mL/min/{1.73_m2} (ref >59)
Globulin, Total: 3.4 g/dL (ref 1.5–4.5)
Glucose: 106 mg/dL — ABNORMAL HIGH (ref 65–99)
Potassium: 4.6 mmol/L (ref 3.5–5.2)
Sodium: 140 mmol/L (ref 134–144)
Total Protein: 7.8 g/dL (ref 6.0–8.5)

## 2019-11-01 LAB — LIPID PANEL
Chol/HDL Ratio: 3.9 ratio (ref 0.0–4.4)
Cholesterol, Total: 140 mg/dL (ref 100–199)
HDL: 36 mg/dL — ABNORMAL LOW (ref >39)
LDL Chol Calc (NIH): 64 mg/dL (ref 0–99)
Triglycerides: 248 mg/dL — ABNORMAL HIGH (ref 0–149)
VLDL Cholesterol Cal: 40 mg/dL (ref 5–40)

## 2019-11-01 LAB — HEMOGLOBIN A1C
Est. average glucose Bld gHb Est-mCnc: 117 mg/dL
Hgb A1c MFr Bld: 5.7 % — ABNORMAL HIGH (ref 4.8–5.6)

## 2019-11-19 ENCOUNTER — Other Ambulatory Visit: Payer: Self-pay | Admitting: Rheumatology

## 2019-11-19 MED ORDER — PROBENECID 500 MG PO TABS: 500.0000 mg | ORAL_TABLET | Freq: Three times a day (TID) | ORAL | 0 refills | Status: DC

## 2019-11-19 NOTE — Telephone Encounter (Signed)
Patient called requesting prescription of Probenecid (1 tablet by mouth 3 times/day) to be sent to her new pharmacy [facility].  Patient states she needs the prescription to be no less than 3 pills per day.  Patient states her gout flair began about 2 weeks ago and is experiencing pain in her right foot and leg.  Patient is also requesting a prescription for the pain while she is experiencing the flair.  Patient states in the past she has taken Tramadol.    Patient states she had a mammogram which was normal and bone density which showed osteopenia.

## 2019-11-19 NOTE — Telephone Encounter (Signed)
Patient states she does not need a refill on Colchicine. Patient will take Colchicine for her gout flare.

## 2019-11-19 NOTE — Telephone Encounter (Signed)
Last Visit: 07/04/19  Next Visit: 01/02/20 Labs: 10/31/19 CMP- Glucose 106, CBC 06/19/19 WNL  Okay to refill Probenecid 500 mg three times daily?   Patient is also requesting a prescription for something for pain during flare.   Please advise.

## 2019-11-19 NOTE — Telephone Encounter (Signed)
Attempted to contact the patient and left message for patient to call the office.  

## 2019-11-19 NOTE — Telephone Encounter (Signed)
Ok to refill probenecid.   She was previously taking colchicine. Does she need a refill of colchicine to take  during gout flares?

## 2019-12-06 ENCOUNTER — Telehealth: Payer: Self-pay | Admitting: *Deleted

## 2019-12-06 NOTE — Telephone Encounter (Signed)
SCHEDULE awv 

## 2019-12-26 NOTE — Progress Notes (Signed)
Office Visit Note  Patient: Isabella Dixon             Date of Birth: 04-25-1946           MRN: [id number]             PCP: Horald Pollen, MD Referring: Horald Pollen, * Visit Date: 01/02/2020 Occupation: @GUAROCC @  Subjective:  Other (patient complains of sciatica pain when she has a gout flare)   History of Present Illness: Isabella Dixon is a 74 y.o. female with history of osteoarthritis and gout.  She states she had a gout flare in March with the pain and swelling in her right ankle joint.  She believes she had a gout flare because she reduced her probenecid from 3 to 2 tablets.  When she increased the dose and started taking Mitigare for 13 days the symptoms resolved.  She states she also had some lower back pain radiating to the right side which eventually resolved.  Activities of Daily Living:  Patient reports morning stiffness for 0 minutes.   Patient Denies nocturnal pain.  Difficulty dressing/grooming: Denies Difficulty climbing stairs: Denies Difficulty getting out of chair: Denies Difficulty using hands for taps, buttons, cutlery, and/or writing: Denies  Review of Systems  Constitutional: Negative for fatigue.  HENT: Negative for mouth sores, mouth dryness and nose dryness.   Eyes: Positive for dryness. Negative for itching.  Respiratory: Negative for shortness of breath and difficulty breathing.   Cardiovascular: Negative for chest pain and palpitations.  Gastrointestinal: Positive for blood in stool, constipation and diarrhea.       History of hemorrhoids  Endocrine: Negative for increased urination.  Genitourinary: Negative for difficulty urinating and painful urination.  Musculoskeletal: Positive for arthralgias, joint pain, joint swelling and muscle tenderness. Negative for muscle weakness and morning stiffness.  Skin: Negative for rash, hair loss and redness.  Allergic/Immunologic: Negative for susceptible to infections.    Neurological: Negative for dizziness, numbness, headaches, memory loss and weakness.  Hematological: Negative for bruising/bleeding tendency.  Psychiatric/Behavioral: Negative for confusion.    PMFS History:  Patient Active Problem List   Diagnosis Date Noted  . Primary osteoarthritis of both hands 06/21/2019  . Primary osteoarthritis of both feet 06/21/2019  . Primary osteoarthritis of both knees 06/21/2019  . History of sciatica 09/06/2017  . Dyslipidemia associated with type 2 diabetes mellitus ([place]) 09/06/2017  . History of gout 09/06/2017  . Diabetes mellitus type 2 in obese ([place]) 05/13/2015  . Hypothyroid 07/25/2012  . Gouty arthritis 05/02/2012  . Osteoarthritis 05/02/2012  . Hypertension associated with diabetes ([place]) 05/02/2012  . Hyperlipidemia 05/02/2012    Past Medical History:  Diagnosis Date  . Anemia   . Arthritis   . Diabetes mellitus without complication ([place])   . Gout   . High cholesterol   . Hypertension   . Osteoporosis     Family History  Problem Relation Age of Onset  . Heart disease Mother   . Stroke Mother   . Hypertension Mother   . Stroke Father   . Colon cancer Sister   . SIDS Son   . Stroke Paternal Grandmother   . Cancer Paternal Grandmother   . ALS Brother   . Stroke Maternal Grandmother   . Cancer Paternal Grandfather   . Testicular cancer Son   . Down syndrome Son   . Heart disease Brother    Past Surgical History:  Procedure Laterality Date  . ABDOMINAL HYSTERECTOMY    .  TUBAL LIGATION     Social History   Social History Narrative  . Not on file   Immunization History  Administered Date(s) Administered  . Fluad Quad(high Dose 65+) 05/02/2019  . Influenza Split 05/02/2012  . Influenza, High Dose Seasonal PF 06/15/2018  . Influenza,inj,Quad PF,6+ Mos 05/08/2013, 04/30/2014, 05/13/2015, 05/17/2016, 09/06/2017  . Pneumococcal Conjugate-13 05/13/2015  . Pneumococcal Polysaccharide-23 11/01/2018  . Tdap 03/14/2008      Objective: Vital Signs: BP (!) 142/79 (BP Location: Left Arm, Patient Position: Sitting, Cuff Size: Normal)   Pulse 73   Resp 16   Ht 5\' 3"  (1.6 m)   Wt 183 lb (83 kg)   BMI 32.42 kg/m    Physical Exam Vitals and nursing note reviewed.  Constitutional:      Appearance: She is well-developed.  HENT:     Head: Normocephalic and atraumatic.  Eyes:     Conjunctiva/sclera: Conjunctivae normal.  Cardiovascular:     Rate and Rhythm: Normal rate and regular rhythm.     Heart sounds: Normal heart sounds.  Pulmonary:     Effort: Pulmonary effort is normal.     Breath sounds: Normal breath sounds.  Abdominal:     General: Bowel sounds are normal.     Palpations: Abdomen is soft.  Musculoskeletal:     Cervical back: Normal range of motion.  Lymphadenopathy:     Cervical: No cervical adenopathy.  Skin:    General: Skin is warm and dry.     Capillary Refill: Capillary refill takes less than 2 seconds.  Neurological:     Mental Status: She is alert and oriented to person, place, and time.  Psychiatric:        Behavior: Behavior normal.      Musculoskeletal Exam: C-spine was in good range of motion.  She is in thoracic kyphosis.  She has good range of motion of her lumbar spine today with no point tenderness.  Shoulder joints, elbow joints, wrist joints with good range of motion.  She has good range of motion of bilateral wrist joints and MCPs with no synovitis.  She has DIP and PIP thickening consistent with osteoarthritis.  Hip joints, knee joints, ankle joints with good range of motion with no synovitis.  She had no tenderness over MTPs.  CDAI Exam: CDAI Score: -- Patient Global: --; Provider Global: -- Swollen: --; Tender: -- Joint Exam 01/02/2020   No joint exam has been documented for this visit   There is currently no information documented on the homunculus. Go to the Rheumatology activity and complete the homunculus joint exam.  Investigation: No additional  findings.  Imaging: No results found.  Recent Labs: Lab Results  Component Value Date   WBC 6.2 06/19/2019   HGB 13.2 06/19/2019   PLT 202 06/19/2019   NA 140 10/31/2019   K 4.6 10/31/2019   CL 99 10/31/2019   CO2 23 10/31/2019   GLUCOSE 106 (H) 10/31/2019   BUN 13 10/31/2019   CREATININE 0.80 10/31/2019   BILITOT 0.4 10/31/2019   ALKPHOS 96 10/31/2019   AST 19 10/31/2019   ALT 16 10/31/2019   PROT 7.8 10/31/2019   ALBUMIN 4.4 10/31/2019   CALCIUM 9.9 10/31/2019   GFRAA 85 10/31/2019    Speciality Comments: No specialty comments available.  Procedures:  No procedures performed Allergies: Patient has no known allergies.   Assessment / Plan:     Visit Diagnoses: Primary osteoarthritis of both hands-she has severe DIP and PIP thickening but not  much discomfort.  Joint protection was discussed.  Primary osteoarthritis of both knees-currently not having much discomfort.  Primary osteoarthritis of both feet-proper fitting shoes were discussed.  DDD (degenerative disc disease), lumbar-she had a flare of lower back pain in March which was radiating down her right lower extremity.  The symptoms have resolved now.  Gouty arthritis -she is on probenecid 500 mg 3 times daily, Mitigare as needed.  (Allopurinol causes severe diarrhea) patient reports having a flare of gout in her right ankle joint in March.  Patient states it happened when her probenecid dose was reduced to twice daily from 3 times daily.- Plan: Uric acid  Medication monitoring encounter - Plan: CBC with Differential/Platelet  Osteopenia of multiple sites -I reviewed her DXA done by PCP 09/28/19 T -2.3.  Use of calcium vitamin D and resistive exercises were discussed.  She will need repeat DEXA in February 2023.  Essential hypertension-her systolic blood pressure is elevated.  I have advised her to monitor.  Type 2 diabetes mellitus without complication, without long-term current use of insulin (HCC)  History  of hyperlipidemia  History of hypothyroidism  Orders: Orders Placed This Encounter  Procedures  . CBC with Differential/Platelet  . Uric acid   Meds ordered this encounter  Medications  . probenecid (BENEMID) 500 MG tablet    Sig: Take 1 tablet (500 mg total) by mouth in the morning, at noon, and at bedtime.    Dispense:  90 tablet    Refill:  5     Follow-Up Instructions: Return in about 6 months (around 07/04/2020) for Osteoarthritis, Gout.   Pollyann Savoy, MD  Note - This record has been created using Animal nutritionist.  Chart creation errors have been sought, but may not always  have been located. Such creation errors do not reflect on  the standard of medical care.

## 2020-01-02 ENCOUNTER — Other Ambulatory Visit: Payer: Self-pay

## 2020-01-02 ENCOUNTER — Encounter: Payer: Self-pay | Admitting: Rheumatology

## 2020-01-02 ENCOUNTER — Ambulatory Visit (INDEPENDENT_AMBULATORY_CARE_PROVIDER_SITE_OTHER): Payer: Medicare Other | Admitting: Rheumatology

## 2020-01-02 VITALS — BP 142/79 | HR 73 | Resp 16 | Ht 63.0 in | Wt 183.0 lb

## 2020-01-02 DIAGNOSIS — M109 Gout, unspecified: Secondary | ICD-10-CM

## 2020-01-02 DIAGNOSIS — M19041 Primary osteoarthritis, right hand: Secondary | ICD-10-CM | POA: Diagnosis not present

## 2020-01-02 DIAGNOSIS — E119 Type 2 diabetes mellitus without complications: Secondary | ICD-10-CM

## 2020-01-02 DIAGNOSIS — M5136 Other intervertebral disc degeneration, lumbar region: Secondary | ICD-10-CM

## 2020-01-02 DIAGNOSIS — M19072 Primary osteoarthritis, left ankle and foot: Secondary | ICD-10-CM

## 2020-01-02 DIAGNOSIS — M19071 Primary osteoarthritis, right ankle and foot: Secondary | ICD-10-CM

## 2020-01-02 DIAGNOSIS — M170 Bilateral primary osteoarthritis of knee: Secondary | ICD-10-CM | POA: Diagnosis not present

## 2020-01-02 DIAGNOSIS — Z780 Asymptomatic menopausal state: Secondary | ICD-10-CM

## 2020-01-02 DIAGNOSIS — Z5181 Encounter for therapeutic drug level monitoring: Secondary | ICD-10-CM

## 2020-01-02 DIAGNOSIS — Z8639 Personal history of other endocrine, nutritional and metabolic disease: Secondary | ICD-10-CM

## 2020-01-02 DIAGNOSIS — M19042 Primary osteoarthritis, left hand: Secondary | ICD-10-CM

## 2020-01-02 DIAGNOSIS — M8589 Other specified disorders of bone density and structure, multiple sites: Secondary | ICD-10-CM

## 2020-01-02 DIAGNOSIS — I10 Essential (primary) hypertension: Secondary | ICD-10-CM

## 2020-01-02 MED ORDER — PROBENECID 500 MG PO TABS: 500.0000 mg | ORAL_TABLET | Freq: Three times a day (TID) | ORAL | 5 refills | Status: DC

## 2020-01-03 LAB — CBC WITH DIFFERENTIAL/PLATELET
Absolute Monocytes: 676 cells/uL (ref 200–950)
Basophils Absolute: 39 cells/uL (ref 0–200)
Basophils Relative: 0.6 %
Eosinophils Absolute: 111 cells/uL (ref 15–500)
Eosinophils Relative: 1.7 %
HCT: 41 % (ref 35.0–45.0)
Hemoglobin: 13.3 g/dL (ref 11.7–15.5)
Lymphs Abs: 2191 cells/uL (ref 850–3900)
MCH: 30 pg (ref 27.0–33.0)
MCHC: 32.4 g/dL (ref 32.0–36.0)
MCV: 92.3 fL (ref 80.0–100.0)
MPV: 10.8 fL (ref 7.5–12.5)
Monocytes Relative: 10.4 %
Neutro Abs: 3484 cells/uL (ref 1500–7800)
Neutrophils Relative %: 53.6 %
Platelets: 188 10*3/uL (ref 140–400)
RBC: 4.44 10*6/uL (ref 3.80–5.10)
RDW: 13.4 % (ref 11.0–15.0)
Total Lymphocyte: 33.7 %
WBC: 6.5 10*3/uL (ref 3.8–10.8)

## 2020-01-03 LAB — URIC ACID: Uric Acid, Serum: 4.9 mg/dL (ref 2.5–7.0)

## 2020-01-03 NOTE — Progress Notes (Signed)
CBC and uric acid are normal.  Continue current treatment.

## 2020-04-30 ENCOUNTER — Ambulatory Visit (INDEPENDENT_AMBULATORY_CARE_PROVIDER_SITE_OTHER): Payer: Medicare Other | Admitting: Emergency Medicine

## 2020-04-30 ENCOUNTER — Other Ambulatory Visit: Payer: Self-pay

## 2020-04-30 ENCOUNTER — Encounter: Payer: Self-pay | Admitting: Emergency Medicine

## 2020-04-30 VITALS — BP 136/83 | HR 63 | Temp 97.6°F | Ht 63.0 in | Wt 181.0 lb

## 2020-04-30 DIAGNOSIS — E669 Obesity, unspecified: Secondary | ICD-10-CM

## 2020-04-30 DIAGNOSIS — E1169 Type 2 diabetes mellitus with other specified complication: Secondary | ICD-10-CM

## 2020-04-30 DIAGNOSIS — Z6832 Body mass index (BMI) 32.0-32.9, adult: Secondary | ICD-10-CM

## 2020-04-30 DIAGNOSIS — Z8739 Personal history of other diseases of the musculoskeletal system and connective tissue: Secondary | ICD-10-CM | POA: Diagnosis not present

## 2020-04-30 DIAGNOSIS — E039 Hypothyroidism, unspecified: Secondary | ICD-10-CM

## 2020-04-30 DIAGNOSIS — E785 Hyperlipidemia, unspecified: Secondary | ICD-10-CM

## 2020-04-30 DIAGNOSIS — E1159 Type 2 diabetes mellitus with other circulatory complications: Secondary | ICD-10-CM | POA: Diagnosis not present

## 2020-04-30 DIAGNOSIS — Z23 Encounter for immunization: Secondary | ICD-10-CM | POA: Diagnosis not present

## 2020-04-30 DIAGNOSIS — M159 Polyosteoarthritis, unspecified: Secondary | ICD-10-CM

## 2020-04-30 DIAGNOSIS — I10 Essential (primary) hypertension: Secondary | ICD-10-CM

## 2020-04-30 NOTE — Assessment & Plan Note (Signed)
Well-controlled hypertension.  Continue present medications.  No changes. Well-controlled diabetes.  Continue present medication.  No changes. Continue statin therapy. Diet and nutrition discussed. Follow-up in 6 months.

## 2020-04-30 NOTE — Patient Instructions (Addendum)
   If you have lab work done today you will be contacted with your lab results within the next 2 weeks.  If you have not heard from us then please contact us. The fastest way to get your results is to register for My Chart.   IF you received an x-ray today, you will receive an invoice from [facility]. Please contact [facility] at [phone] with questions or concerns regarding your invoice.   IF you received labwork today, you will receive an invoice from [facility]. Please contact [facility] at [phone] with questions or concerns regarding your invoice.   Our billing staff will not be able to assist you with questions regarding bills from these companies.  You will be contacted with the lab results as soon as they are available. The fastest way to get your results is to activate your My Chart account. Instructions are located on the last page of this paperwork. If you have not heard from us regarding the results in 2 weeks, please contact this office.     Health Maintenance After Age 65 After age 65, you are at a higher risk for certain long-term diseases and infections as well as injuries from falls. Falls are a major cause of broken bones and head injuries in people who are older than age 65. Getting regular preventive care can help to keep you healthy and well. Preventive care includes getting regular testing and making lifestyle changes as recommended by your health care provider. Talk with your health care provider about:  Which screenings and tests you should have. A screening is a test that checks for a disease when you have no symptoms.  A diet and exercise plan that is right for you. What should I know about screenings and tests to prevent falls? Screening and testing are the best ways to find a health problem early. Early diagnosis and treatment give you the best chance of managing medical conditions that are common after age 65. Certain conditions and  lifestyle choices may make you more likely to have a fall. Your health care provider may recommend:  Regular vision checks. Poor vision and conditions such as cataracts can make you more likely to have a fall. If you wear glasses, make sure to get your prescription updated if your vision changes.  Medicine review. Work with your health care provider to regularly review all of the medicines you are taking, including over-the-counter medicines. Ask your health care provider about any side effects that may make you more likely to have a fall. Tell your health care provider if any medicines that you take make you feel dizzy or sleepy.  Osteoporosis screening. Osteoporosis is a condition that causes the bones to get weaker. This can make the bones weak and cause them to break more easily.  Blood pressure screening. Blood pressure changes and medicines to control blood pressure can make you feel dizzy.  Strength and balance checks. Your health care provider may recommend certain tests to check your strength and balance while standing, walking, or changing positions.  Foot health exam. Foot pain and numbness, as well as not wearing proper footwear, can make you more likely to have a fall.  Depression screening. You may be more likely to have a fall if you have a fear of falling, feel emotionally low, or feel unable to do activities that you used to do.  Alcohol use screening. Using too much alcohol can affect your balance and may make you more likely to   have a fall. What actions can I take to lower my risk of falls? General instructions  Talk with your health care provider about your risks for falling. Tell your health care provider if: ? You fall. Be sure to tell your health care provider about all falls, even ones that seem minor. ? You feel dizzy, sleepy, or off-balance.  Take over-the-counter and prescription medicines only as told by your health care provider. These include any  supplements.  Eat a healthy diet and maintain a healthy weight. A healthy diet includes low-fat dairy products, low-fat (lean) meats, and fiber from whole grains, beans, and lots of fruits and vegetables. Home safety  Remove any tripping hazards, such as rugs, cords, and clutter.  Install safety equipment such as grab bars in bathrooms and safety rails on stairs.  Keep rooms and walkways well-lit. Activity   Follow a regular exercise program to stay fit. This will help you maintain your balance. Ask your health care provider what types of exercise are appropriate for you.  If you need a cane or walker, use it as recommended by your health care provider.  Wear supportive shoes that have nonskid soles. Lifestyle  Do not drink alcohol if your health care provider tells you not to drink.  If you drink alcohol, limit how much you have: ? 0-1 drink a day for women. ? 0-2 drinks a day for men.  Be aware of how much alcohol is in your drink. In the [place] one drink equals one typical bottle of beer (12 oz), one-half glass of wine (5 oz), or one shot of hard liquor (1 oz).  Do not use any products that contain nicotine or tobacco, such as cigarettes and e-cigarettes. If you need help quitting, ask your health care provider. Summary  Having a healthy lifestyle and getting preventive care can help to protect your health and wellness after age 65.  Screening and testing are the best way to find a health problem early and help you avoid having a fall. Early diagnosis and treatment give you the best chance for managing medical conditions that are more common for people who are older than age 65.  Falls are a major cause of broken bones and head injuries in people who are older than age 65. Take precautions to prevent a fall at home.  Work with your health care provider to learn what changes you can make to improve your health and wellness and to prevent falls. This information is not intended  to replace advice given to you by your health care provider. Make sure you discuss any questions you have with your health care provider. Document Revised: 11/30/2018 Document Reviewed: 06/22/2017 Elsevier Patient Education  2020 [employer]  

## 2020-04-30 NOTE — Progress Notes (Signed)
Isabella Dixon 74 y.o.   Chief Complaint  Patient presents with  . Diabetes    FBS readings at home 100 to 110   . Hypertension    HISTORY OF PRESENT ILLNESS: This is a 74 y.o. female with history of multiple chronic medical problems, here for follow-up. 1.  Hypertension: On Cozaar 100 mg daily and Lopressor 25 mg twice daily.  Doing well.  Normal blood pressure readings at home. 2.  Diabetes: On Metformin 500 mg twice a day.  Doing well Lab Results  Component Value Date   HGBA1C 5.7 (H) 10/31/2019   3.  Osteoarthritis of multiple joints and also history of gout.  Sees rheumatologist on a regular basis.  Takes gabapentin at bedtime.  Probenecid, uricosuric agent, recently increased to 3 times a day. 4.  Dyslipidemia: On rosuvastatin 10 mg daily. 5.  History of hypothyroidism, on Synthroid 75 mcg daily. Doing well.  Has no complaints or medical concerns today. Fully vaccinated against Covid. BP Readings from Last 3 Encounters:  04/30/20 136/83  01/02/20 (!) 142/79  10/31/19 110/76    HPI   Prior to Admission medications   Medication Sig Start Date End Date Taking? Authorizing Provider  chlorpheniramine (CHLOR-TRIMETON) 4 MG tablet Take 4 mg by mouth 2 (two) times daily as needed for allergies.   Yes [provider]  Cholecalciferol (VITAMIN D-3 PO) Take 2,000 Units by mouth daily.    Yes [provider]  gabapentin (NEURONTIN) 100 MG capsule Take 2 capsules (200 mg total) by mouth at bedtime. 10/31/19 04/30/20 Yes Todrick Siedschlag, Eilleen Kempf, MD  IBUPROFEN PO Take by mouth as needed.   Yes [provider]  levothyroxine (SYNTHROID) 75 MCG tablet Take 1 tablet (75 mcg total) by mouth daily. 10/31/19 04/30/20 Yes Nayana Lenig, Eilleen Kempf, MD  losartan (COZAAR) 100 MG tablet TAKE 1 TABLET DAILY 10/31/19  Yes Georgina Quint, MD  metFORMIN (GLUCOPHAGE) 500 MG tablet Take 1 tablet (500 mg total) by mouth 2 (two) times daily with a meal. 10/31/19 04/30/20 Yes  Fernando Stoiber, Eilleen Kempf, MD  metoprolol tartrate (LOPRESSOR) 25 MG tablet Take 1 tablet (25 mg total) by mouth 2 (two) times daily. 10/31/19 04/30/20 Yes Jerrid Forgette, Eilleen Kempf, MD  MITIGARE 0.6 MG CAPS Take 2 capsules by mouth daily as needed. 06/22/19  Yes Deveshwar, Janalyn Rouse, MD  Multiple Vitamin (MULTIVITAMIN) tablet Take 1 tablet by mouth daily.   Yes [provider]  OVER THE COUNTER MEDICATION OTC Vit D 1400 mg daily   Yes [provider]  probenecid (BENEMID) 500 MG tablet Take 1 tablet (500 mg total) by mouth in the morning, at noon, and at bedtime. 01/02/20  Yes Deveshwar, Janalyn Rouse, MD  rosuvastatin (CRESTOR) 10 MG tablet TAKE 1 TABLET DAILY 10/31/19  Yes Masa Lubin, Eilleen Kempf, MD  vitamin C (ASCORBIC ACID) 250 MG tablet Take 250 mg by mouth daily.   Yes [provider]    No Known Allergies  Patient Active Problem List   Diagnosis Date Noted  . Primary osteoarthritis of both hands 06/21/2019  . Primary osteoarthritis of both feet 06/21/2019  . Primary osteoarthritis of both knees 06/21/2019  . History of sciatica 09/06/2017  . Dyslipidemia associated with type 2 diabetes mellitus (HCC) 09/06/2017  . History of gout 09/06/2017  . Diabetes mellitus type 2 in obese (HCC) 05/13/2015  . Hypothyroid 07/25/2012  . Gouty arthritis 05/02/2012  . Osteoarthritis 05/02/2012  . Hypertension associated with diabetes (HCC) 05/02/2012  . Hyperlipidemia 05/02/2012  Past Medical History:  Diagnosis Date  . Anemia   . Arthritis   . Diabetes mellitus without complication (HCC)   . Gout   . High cholesterol   . Hypertension   . Osteoporosis     Past Surgical History:  Procedure Laterality Date  . ABDOMINAL HYSTERECTOMY    . TUBAL LIGATION      Social History   Socioeconomic History  . Marital status: Married    Spouse name: Not on file  . Number of children: Not on file  . Years of education: Not on file  . Highest education level: Not on file  Occupational  History  . Not on file  Tobacco Use  . Smoking status: Never Smoker  . Smokeless tobacco: Never Used  Vaping Use  . Vaping Use: Never used  Substance and Sexual Activity  . Alcohol use: No  . Drug use: No  . Sexual activity: Yes  Other Topics Concern  . Not on file  Social History Narrative  . Not on file   Social Determinants of Health   Financial Resource Strain:   . Difficulty of Paying Living Expenses: Not on file  Food Insecurity:   . Worried About Programme researcher, broadcasting/film/video in the Last Year: Not on file  . Ran Out of Food in the Last Year: Not on file  Transportation Needs:   . Lack of Transportation (Medical): Not on file  . Lack of Transportation (Non-Medical): Not on file  Physical Activity:   . Days of Exercise per Week: Not on file  . Minutes of Exercise per Session: Not on file  Stress:   . Feeling of Stress : Not on file  Social Connections:   . Frequency of Communication with Friends and Family: Not on file  . Frequency of Social Gatherings with Friends and Family: Not on file  . Attends Religious Services: Not on file  . Active Member of Clubs or Organizations: Not on file  . Attends Banker Meetings: Not on file  . Marital Status: Not on file  Intimate Partner Violence:   . Fear of Current or Ex-Partner: Not on file  . Emotionally Abused: Not on file  . Physically Abused: Not on file  . Sexually Abused: Not on file    Family History  Problem Relation Age of Onset  . Heart disease Mother   . Stroke Mother   . Hypertension Mother   . Stroke Father   . Colon cancer Sister   . SIDS Son   . Stroke Paternal Grandmother   . Cancer Paternal Grandmother   . ALS Brother   . Stroke Maternal Grandmother   . Cancer Paternal Grandfather   . Testicular cancer Son   . Down syndrome Son   . Heart disease Brother      Review of Systems  Constitutional: Negative.  Negative for chills and fever.  HENT: Negative.  Negative for congestion and sore  throat.   Respiratory: Negative.  Negative for cough and shortness of breath.   Cardiovascular: Negative.  Negative for chest pain and palpitations.  Gastrointestinal: Negative.  Negative for abdominal pain, blood in stool, melena, nausea and vomiting.  Genitourinary: Negative.  Negative for dysuria and hematuria.  Musculoskeletal: Negative.  Negative for back pain, myalgias and neck pain.  Skin: Negative.  Negative for rash.  Neurological: Negative for dizziness and headaches.  All other systems reviewed and are negative.   Today's Vitals   04/30/20 0840 04/30/20 3818  BP: (!) 143/80 136/83  Pulse: 63   Temp: 97.6 F (36.4 C)   SpO2: 98%   Weight: 181 lb (82.1 kg)   Height: 5\' 3"  (1.6 m)    Body mass index is 32.06 kg/m. Wt Readings from Last 3 Encounters:  04/30/20 181 lb (82.1 kg)  01/02/20 183 lb (83 kg)  10/31/19 179 lb (81.2 kg)    Physical Exam Vitals reviewed.  Constitutional:      Appearance: Normal appearance.  HENT:     Head: Normocephalic.  Eyes:     Extraocular Movements: Extraocular movements intact.     Conjunctiva/sclera: Conjunctivae normal.     Pupils: Pupils are equal, round, and reactive to light.  Cardiovascular:     Rate and Rhythm: Normal rate and regular rhythm.     Pulses: Normal pulses.     Heart sounds: Normal heart sounds.  Pulmonary:     Effort: Pulmonary effort is normal.     Breath sounds: Normal breath sounds.  Musculoskeletal:        General: Normal range of motion.     Cervical back: Normal range of motion and neck supple.  Skin:    General: Skin is warm and dry.     Capillary Refill: Capillary refill takes less than 2 seconds.  Neurological:     General: No focal deficit present.     Mental Status: She is alert and oriented to person, place, and time.  Psychiatric:        Mood and Affect: Mood normal.        Behavior: Behavior normal.    A total of 30 minutes was spent with the patient, greater than 50% of which was in  counseling/coordination of care regarding multiple chronic medical conditions under treatment, cardiovascular risks associated with these conditions, review of all medications, review of most recent office visit notes, review of most recent blood work results, diet and nutrition, health maintenance items, prognosis and need for follow-up.   ASSESSMENT & PLAN: Hypertension associated with diabetes (HCC) Well-controlled hypertension.  Continue present medications.  No changes. Well-controlled diabetes.  Continue present medication.  No changes. Continue statin therapy. Diet and nutrition discussed. Follow-up in 6 months.  Anslie was seen today for diabetes and hypertension.  Diagnoses and all orders for this visit:  Obesity, diabetes, and hypertension syndrome (HCC)  Body mass index (BMI) of 32.0-32.9 in adult  Hypertension associated with diabetes (HCC)  Dyslipidemia associated with type 2 diabetes mellitus (HCC)  History of gout  Osteoarthritis of multiple joints, unspecified osteoarthritis type  Encounter for immunization -     Flu Vaccine QUAD 6+ mos PF IM (Fluarix Quad PF)  Hypothyroidism, unspecified type    Patient Instructions       If you have lab work done today you will be contacted with your lab results within the next 2 weeks.  If you have not heard from 01-[birth year]-1995 then please contact us. The fastest way to get your results is to register for My Chart.   IF you received an x-ray today, you will receive an invoice from [facility]. Please contact [facility] at [phone] with questions or concerns regarding your invoice.   IF you received labwork today, you will receive an invoice from [facility]. Please contact [facility] at [phone] with questions or concerns regarding your invoice.   Our billing staff will not be able to assist you with questions regarding bills from these companies.  You will be contacted with the lab results  as soon as  they are available. The fastest way to get your results is to activate your My Chart account. Instructions are located on the last page of this paperwork. If you have not heard from us regarding the results in 2 weeks, please contact this office.      Health Maintenance After Age 73 After age 73, you are at a higher risk for certain long-term diseases and infections as well as injuries from falls. Falls are a major cause of broken bones and head injuries in people who are older than age 73. Getting regular preventive care can help to keep you healthy and well. Preventive care includes getting regular testing and making lifestyle changes as recommended by your health care provider. Talk with your health care provider about:  Which screenings and tests you should have. A screening is a test that checks for a disease when you have no symptoms.  A diet and exercise plan that is right for you. What should I know about screenings and tests to prevent falls? Screening and testing are the best ways to find a health problem early. Early diagnosis and treatment give you the best chance of managing medical conditions that are common after age 73. Certain conditions and lifestyle choices may make you more likely to have a fall. Your health care provider may recommend:  Regular vision checks. Poor vision and conditions such as cataracts can make you more likely to have a fall. If you wear glasses, make sure to get your prescription updated if your vision changes.  Medicine review. Work with your health care provider to regularly review all of the medicines you are taking, including over-the-counter medicines. Ask your health care provider about any side effects that may make you more likely to have a fall. Tell your health care provider if any medicines that you take make you feel dizzy or sleepy.  Osteoporosis screening. Osteoporosis is a condition that causes the bones to get weaker. This can make the bones  weak and cause them to break more easily.  Blood pressure screening. Blood pressure changes and medicines to control blood pressure can make you feel dizzy.  Strength and balance checks. Your health care provider may recommend certain tests to check your strength and balance while standing, walking, or changing positions.  Foot health exam. Foot pain and numbness, as well as not wearing proper footwear, can make you more likely to have a fall.  Depression screening. You may be more likely to have a fall if you have a fear of falling, feel emotionally low, or feel unable to do activities that you used to do.  Alcohol use screening. Using too much alcohol can affect your balance and may make you more likely to have a fall. What actions can I take to lower my risk of falls? General instructions  Talk with your health care provider about your risks for falling. Tell your health care provider if: ? You fall. Be sure to tell your health care provider about all falls, even ones that seem minor. ? You feel dizzy, sleepy, or off-balance.  Take over-the-counter and prescription medicines only as told by your health care provider. These include any supplements.  Eat a healthy diet and maintain a healthy weight. A healthy diet includes low-fat dairy products, low-fat (lean) meats, and fiber from whole grains, beans, and lots of fruits and vegetables. Home safety  Remove any tripping hazards, such as rugs, cords, and clutter.  Install safety equipment such as  grab bars in bathrooms and safety rails on stairs.  Keep rooms and walkways well-lit. Activity   Follow a regular exercise program to stay fit. This will help you maintain your balance. Ask your health care provider what types of exercise are appropriate for you.  If you need a cane or walker, use it as recommended by your health care provider.  Wear supportive shoes that have nonskid soles. Lifestyle  Do not drink alcohol if your  health care provider tells you not to drink.  If you drink alcohol, limit how much you have: ? 0-1 drink a day for women. ? 0-2 drinks a day for men.  Be aware of how much alcohol is in your drink. In the [place] one drink equals one typical bottle of beer (12 oz), one-half glass of wine (5 oz), or one shot of hard liquor (1 oz).  Do not use any products that contain nicotine or tobacco, such as cigarettes and e-cigarettes. If you need help quitting, ask your health care provider. Summary  Having a healthy lifestyle and getting preventive care can help to protect your health and wellness after age 32.  Screening and testing are the best way to find a health problem early and help you avoid having a fall. Early diagnosis and treatment give you the best chance for managing medical conditions that are more common for people who are older than age 23.  Falls are a major cause of broken bones and head injuries in people who are older than age 20. Take precautions to prevent a fall at home.  Work with your health care provider to learn what changes you can make to improve your health and wellness and to prevent falls. This information is not intended to replace advice given to you by your health care provider. Make sure you discuss any questions you have with your health care provider. Document Revised: 11/30/2018 Document Reviewed: 06/22/2017 Elsevier Patient Education  2020 [employer]      Edwina Barth, MD [facility]

## 2020-05-27 ENCOUNTER — Telehealth: Payer: Self-pay | Admitting: *Deleted

## 2020-05-27 NOTE — Telephone Encounter (Signed)
Schedule AWV.  

## 2020-06-17 LAB — HM DIABETES EYE EXAM

## 2020-06-18 NOTE — Progress Notes (Signed)
Office Visit Note  Patient: Isabella Dixon             Date of Birth: June 21, 1946           MRN: [id number]             PCP: Georgina Quint, MD Referring: Georgina Quint, * Visit Date: 07/02/2020 Occupation: @GUAROCC @  Subjective:  Medication monitoring  History of Present Illness: Isabella Dixon is a 74 y.o. female with history of osteoarthritis, gout, and DDD. She takes probenecid 500 mg TID for management of gout and mitigare 1 tablet BID PRN during flares.  She is tolerating probenecid without any side effects and has not needed to take Medicare recently.  She has not had any recent gout flares.  She is not experiencing any joint pain or joint swelling at this time.  She has morning stiffness lasting several minutes in the morning.  She states that the other day she experienced a locking sensation in her right hand which was self resolving.  She denies any numbness or tingling or pain in her right hand at this time.  Activities of Daily Living:  Patient reports morning stiffness for a few  minutes.   Patient Denies nocturnal pain.  Difficulty dressing/grooming: Denies Difficulty climbing stairs: Denies Difficulty getting out of chair: Denies Difficulty using hands for taps, buttons, cutlery, and/or writing: Denies  Review of Systems  Constitutional: Negative for fatigue.  HENT: Negative for mouth sores, mouth dryness and nose dryness.   Eyes: Positive for dryness. Negative for pain and visual disturbance.  Respiratory: Negative for cough, hemoptysis, shortness of breath and difficulty breathing.   Cardiovascular: Negative for chest pain, palpitations, hypertension and swelling in legs/feet.  Gastrointestinal: Negative for blood in stool, constipation and diarrhea.  Endocrine: Negative for increased urination.  Genitourinary: Negative for painful urination.  Musculoskeletal: Positive for morning stiffness. Negative for arthralgias, joint pain, joint  swelling, myalgias, muscle weakness, muscle tenderness and myalgias.  Skin: Negative for color change, pallor, rash, hair loss, nodules/bumps, skin tightness, ulcers and sensitivity to sunlight.  Allergic/Immunologic: Negative for susceptible to infections.  Neurological: Negative for dizziness, numbness, headaches and weakness.  Hematological: Negative for swollen glands.  Psychiatric/Behavioral: Negative for depressed mood and sleep disturbance. The patient is not nervous/anxious.     PMFS History:  Patient Active Problem List   Diagnosis Date Noted  . Primary osteoarthritis of both hands 06/21/2019  . Primary osteoarthritis of both feet 06/21/2019  . Primary osteoarthritis of both knees 06/21/2019  . History of sciatica 09/06/2017  . Dyslipidemia associated with type 2 diabetes mellitus (HCC) 09/06/2017  . History of gout 09/06/2017  . Diabetes mellitus type 2 in obese (HCC) 05/13/2015  . Hypothyroid 07/25/2012  . Gouty arthritis 05/02/2012  . Osteoarthritis 05/02/2012  . Hypertension associated with diabetes (HCC) 05/02/2012  . Hyperlipidemia 05/02/2012    Past Medical History:  Diagnosis Date  . Anemia   . Arthritis   . Diabetes mellitus without complication (HCC)   . Gout   . High cholesterol   . Hypertension   . Osteoporosis     Family History  Problem Relation Age of Onset  . Heart disease Mother   . Stroke Mother   . Hypertension Mother   . Stroke Father   . Colon cancer Sister   . SIDS Son   . Stroke Paternal Grandmother   . Cancer Paternal Grandmother   . ALS Brother   . Stroke Maternal Grandmother   .  Cancer Paternal Grandfather   . Testicular cancer Son   . Down syndrome Son   . Heart disease Brother    Past Surgical History:  Procedure Laterality Date  . ABDOMINAL HYSTERECTOMY    . TUBAL LIGATION     Social History   Social History Narrative  . Not on file   Immunization History  Administered Date(s) Administered  . Fluad Quad(high Dose  65+) 05/02/2019, 04/30/2020  . Influenza Split 05/02/2012  . Influenza, High Dose Seasonal PF 06/15/2018  . Influenza,inj,Quad PF,6+ Mos 05/08/2013, 04/30/2014, 05/13/2015, 05/17/2016, 09/06/2017  . PFIZER SARS-COV-2 Vaccination 09/17/2019, 10/08/2019, 06/05/2020  . Pneumococcal Conjugate-13 05/13/2015  . Pneumococcal Polysaccharide-23 11/01/2018  . Tdap 03/14/2008     Objective: Vital Signs: BP 139/82 (BP Location: Left Arm, Patient Position: Sitting, Cuff Size: Normal)   Pulse 61   Resp 16   Ht 5\' 3"  (1.6 m)   Wt 175 lb (79.4 kg)   BMI 31.00 kg/m    Physical Exam Vitals and nursing note reviewed.  Constitutional:      Appearance: She is well-developed.  HENT:     Head: Normocephalic and atraumatic.  Eyes:     Conjunctiva/sclera: Conjunctivae normal.  Pulmonary:     Effort: Pulmonary effort is normal.  Abdominal:     Palpations: Abdomen is soft.  Musculoskeletal:     Cervical back: Normal range of motion.  Skin:    General: Skin is warm and dry.     Capillary Refill: Capillary refill takes less than 2 seconds.  Neurological:     Mental Status: She is alert and oriented to person, place, and time.  Psychiatric:        Behavior: Behavior normal.      Musculoskeletal Exam: C-spine, thoracic spine, and lumbar spine good ROM.  Shoulder joints, elbow joints, wrist joints, MCPs, PIPs, and DIPs good ROM with no synovitis.  Complete fist formation bilaterally.  PIP and DIP thickening consistent with osteoarthritis of both hands.  Hip joints, knee joints, ankle joints good ROM with no discomfort.  No warmth or effusion of knee joints.  No tenderness or swelling of ankle joints.    CDAI Exam: CDAI Score: -- Patient Global: --; Provider Global: -- Swollen: --; Tender: -- Joint Exam 07/02/2020   No joint exam has been documented for this visit   There is currently no information documented on the homunculus. Go to the Rheumatology activity and complete the homunculus joint  exam.  Investigation: No additional findings.  Imaging: No results found.  Recent Labs: Lab Results  Component Value Date   WBC 6.5 01/02/2020   HGB 13.3 01/02/2020   PLT 188 01/02/2020   NA 140 10/31/2019   K 4.6 10/31/2019   CL 99 10/31/2019   CO2 23 10/31/2019   GLUCOSE 106 (H) 10/31/2019   BUN 13 10/31/2019   CREATININE 0.80 10/31/2019   BILITOT 0.4 10/31/2019   ALKPHOS 96 10/31/2019   AST 19 10/31/2019   ALT 16 10/31/2019   PROT 7.8 10/31/2019   ALBUMIN 4.4 10/31/2019   CALCIUM 9.9 10/31/2019   GFRAA 85 10/31/2019    Speciality Comments: No specialty comments available.  Procedures:  No procedures performed Allergies: Patient has no known allergies.   Assessment / Plan:     Visit Diagnoses: Primary osteoarthritis of both hands: She has PIP and DIP thickening consistent with osteoarthritis of both hands.  CMC joint prominence noted bilaterally.  No tenderness or synovitis was noted on exam.  She is able  to make a complete fist bilaterally.  We discussed the importance of joint protection and muscle strengthening.  She was encouraged to perform hand exercises on a daily basis.  She was advised to notify us if she develops increased joint pain or joint swelling.  She will follow-up in the office in 6 months.  Primary osteoarthritis of both knees: She has good range of motion of both knee joints on exam.  No warmth or effusion was noted.  No knee crepitus noted.  She has no difficulty climbing steps or rising from a seated position.  Primary osteoarthritis of both feet: She is not experiencing any discomfort in her feet at this time.  She has good range of motion of both ankle joints with no tenderness or inflammation.  DDD (degenerative disc disease), lumbar: She is not having any lower back pain or stiffness at this time.  No symptoms of radiculopathy.   Gouty arthritis - She has not had any recent gout flares.  She is clinicaly doing well on probenecid 500 mg 3 times  daily.  She is tolerating probenecid without any side effects. She takes Mitigare BID as needed during gout flares.  She has not needed to take Medicare recently.  She is not experiencing any joint pain or swelling at this time.  Uric acid level was 4.9 on 01/02/2020.  We will recheck uric acid level today.  CBC and CMP will also be drawn to monitor for drug toxicity.  She was advised to notify us if she develops signs or symptoms of a gout flare.   (Allopurinol causes severe diarrhea) - Plan: CBC with Differential/Platelet, COMPLETE METABOLIC PANEL WITH GFR, Uric acid  Medication monitoring encounter -Patient is due to update CBC and CMP today.  We will also check her uric acid level today plan: CBC with Differential/Platelet, COMPLETE METABOLIC PANEL WITH GFR, Uric acid  Osteopenia of multiple sites - DEXA done by PCP 09/28/19 T -2.3.  She is taking vitamin D 2,000 units daily.   Other medical conditions are listed as follows:   Type 2 diabetes mellitus without complication, without long-term current use of insulin (HCC)  Essential hypertension  History of hypothyroidism  History of hyperlipidemia   Orders: Orders Placed This Encounter  Procedures  . CBC with Differential/Platelet  . COMPLETE METABOLIC PANEL WITH GFR  . Uric acid   No orders of the defined types were placed in this encounter.    Follow-Up Instructions: Return in about 6 months (around 12/30/2020) for Gout, Osteoarthritis, DDD.   Gearldine Bienenstock, PA-C  Note - This record has been created using Dragon software.  Chart creation errors have been sought, but may not always  have been located. Such creation errors do not reflect on  the standard of medical care.

## 2020-06-27 ENCOUNTER — Other Ambulatory Visit: Payer: Self-pay

## 2020-06-27 MED ORDER — PROBENECID 500 MG PO TABS: 500.0000 mg | ORAL_TABLET | Freq: Three times a day (TID) | ORAL | 0 refills | Status: DC

## 2020-06-27 NOTE — Telephone Encounter (Signed)
Last Visit: 01/02/2020 Next Visit: 07/02/2020 Labs: 01/02/2020 CBC and uric acid are normal.   Current Dose per office note on 01/02/2020: probenecid 500 mg 3 times daily Dx: Gouty arthritis   Okay to refill Probenecid?

## 2020-06-27 NOTE — Telephone Encounter (Signed)
Patient called stating she will run out of Probenecid next Wednesday, 07/02/20.  Patient states the last prescription she picked up was in May for a 90 day supply.  Patient is requesting a new prescription be sent to her local pharmacy [facility] for a 90 day supply (3 tablets per day).

## 2020-07-02 ENCOUNTER — Ambulatory Visit (INDEPENDENT_AMBULATORY_CARE_PROVIDER_SITE_OTHER): Payer: Medicare Other | Admitting: Physician Assistant

## 2020-07-02 ENCOUNTER — Encounter: Payer: Self-pay | Admitting: Physician Assistant

## 2020-07-02 ENCOUNTER — Other Ambulatory Visit: Payer: Self-pay

## 2020-07-02 VITALS — BP 139/82 | HR 61 | Resp 16 | Ht 63.0 in | Wt 175.0 lb

## 2020-07-02 DIAGNOSIS — M5136 Other intervertebral disc degeneration, lumbar region: Secondary | ICD-10-CM

## 2020-07-02 DIAGNOSIS — M19071 Primary osteoarthritis, right ankle and foot: Secondary | ICD-10-CM | POA: Diagnosis not present

## 2020-07-02 DIAGNOSIS — I10 Essential (primary) hypertension: Secondary | ICD-10-CM

## 2020-07-02 DIAGNOSIS — M109 Gout, unspecified: Secondary | ICD-10-CM

## 2020-07-02 DIAGNOSIS — Z8639 Personal history of other endocrine, nutritional and metabolic disease: Secondary | ICD-10-CM

## 2020-07-02 DIAGNOSIS — M170 Bilateral primary osteoarthritis of knee: Secondary | ICD-10-CM | POA: Diagnosis not present

## 2020-07-02 DIAGNOSIS — M8589 Other specified disorders of bone density and structure, multiple sites: Secondary | ICD-10-CM

## 2020-07-02 DIAGNOSIS — E119 Type 2 diabetes mellitus without complications: Secondary | ICD-10-CM

## 2020-07-02 DIAGNOSIS — Z5181 Encounter for therapeutic drug level monitoring: Secondary | ICD-10-CM

## 2020-07-02 DIAGNOSIS — M19041 Primary osteoarthritis, right hand: Secondary | ICD-10-CM

## 2020-07-02 DIAGNOSIS — M19042 Primary osteoarthritis, left hand: Secondary | ICD-10-CM

## 2020-07-02 DIAGNOSIS — M19072 Primary osteoarthritis, left ankle and foot: Secondary | ICD-10-CM

## 2020-07-03 LAB — CBC WITH DIFFERENTIAL/PLATELET
Absolute Monocytes: 690 cells/uL (ref 200–950)
Basophils Absolute: 47 cells/uL (ref 0–200)
Basophils Relative: 0.7 %
Eosinophils Absolute: 127 cells/uL (ref 15–500)
Eosinophils Relative: 1.9 %
HCT: 41 % (ref 35.0–45.0)
Hemoglobin: 13.8 g/dL (ref 11.7–15.5)
Lymphs Abs: 2774 cells/uL (ref 850–3900)
MCH: 30.5 pg (ref 27.0–33.0)
MCHC: 33.7 g/dL (ref 32.0–36.0)
MCV: 90.5 fL (ref 80.0–100.0)
MPV: 11.6 fL (ref 7.5–12.5)
Monocytes Relative: 10.3 %
Neutro Abs: 3062 cells/uL (ref 1500–7800)
Neutrophils Relative %: 45.7 %
Platelets: 214 10*3/uL (ref 140–400)
RBC: 4.53 10*6/uL (ref 3.80–5.10)
RDW: 13 % (ref 11.0–15.0)
Total Lymphocyte: 41.4 %
WBC: 6.7 10*3/uL (ref 3.8–10.8)

## 2020-07-03 LAB — COMPLETE METABOLIC PANEL WITH GFR
AG Ratio: 1.1 (calc) (ref 1.0–2.5)
ALT: 13 U/L (ref 6–29)
AST: 17 U/L (ref 10–35)
Albumin: 4.4 g/dL (ref 3.6–5.1)
Alkaline phosphatase (APISO): 80 U/L (ref 37–153)
BUN: 15 mg/dL (ref 7–25)
CO2: 29 mmol/L (ref 20–32)
Calcium: 10.4 mg/dL (ref 8.6–10.4)
Chloride: 102 mmol/L (ref 98–110)
Creat: 0.79 mg/dL (ref 0.60–0.93)
GFR, Est African American: 86 mL/min/{1.73_m2} (ref >=60)
GFR, Est Non African American: 74 mL/min/{1.73_m2} (ref >=60)
Globulin: 3.9 g/dL (calc) — ABNORMAL HIGH (ref 1.9–3.7)
Glucose, Bld: 81 mg/dL (ref 65–139)
Potassium: 4.5 mmol/L (ref 3.5–5.3)
Sodium: 141 mmol/L (ref 135–146)
Total Bilirubin: 0.5 mg/dL (ref 0.2–1.2)
Total Protein: 8.3 g/dL — ABNORMAL HIGH (ref 6.1–8.1)

## 2020-07-03 LAB — URIC ACID: Uric Acid, Serum: 4.6 mg/dL (ref 2.5–7.0)

## 2020-07-03 NOTE — Progress Notes (Signed)
CBC WNL.  Total protein is borderline elevated.  Rest of CMP. Uric acid is within the desirable range.

## 2020-07-31 ENCOUNTER — Other Ambulatory Visit: Payer: Self-pay | Admitting: *Deleted

## 2020-07-31 DIAGNOSIS — Z8669 Personal history of other diseases of the nervous system and sense organs: Secondary | ICD-10-CM

## 2020-07-31 MED ORDER — GABAPENTIN 100 MG PO CAPS: 200.0000 mg | ORAL_CAPSULE | Freq: Every day | ORAL | 3 refills | Status: DC

## 2020-09-18 ENCOUNTER — Other Ambulatory Visit: Payer: Self-pay | Admitting: *Deleted

## 2020-09-18 MED ORDER — PROBENECID 500 MG PO TABS: 500.0000 mg | ORAL_TABLET | Freq: Three times a day (TID) | ORAL | 0 refills | Status: DC

## 2020-09-18 NOTE — Telephone Encounter (Signed)
Faxed RX from [facility]  Last Visit: 07/02/2020 Next Visit: 12/31/2020 Labs: 07/02/2020, CBC WNL. Total protein is borderline elevated. Rest of CMP. Uric acid is within the desirable range.   Current Dose per office note 07/02/2020, probenecid 500 mg 3 times daily DX: Gouty arthritis   Okay to refill Probenecid?

## 2020-09-22 ENCOUNTER — Other Ambulatory Visit: Payer: Self-pay | Admitting: Rheumatology

## 2020-09-22 NOTE — Telephone Encounter (Signed)
Refill was sent 09/18/20.  Did the pharmacy receive the refill?

## 2020-09-22 NOTE — Telephone Encounter (Signed)
Last Visit: 07/02/2020 Next Visit: 12/31/2020 Labs: 07/02/2020, CBC WNL. Total protein is borderline elevated. Rest of CMP. Uric acid is within the desirable range.   Current Dose per office note 07/02/2020, probenecid 500 mg 3 times daily FX:OVANVBT osteoarthritis of both hands  Okay to refill probenecid (BENEMID)?

## 2020-10-12 ENCOUNTER — Other Ambulatory Visit: Payer: Self-pay | Admitting: Emergency Medicine

## 2020-10-12 DIAGNOSIS — E785 Hyperlipidemia, unspecified: Secondary | ICD-10-CM

## 2020-10-12 DIAGNOSIS — E119 Type 2 diabetes mellitus without complications: Secondary | ICD-10-CM

## 2020-10-12 DIAGNOSIS — I10 Essential (primary) hypertension: Secondary | ICD-10-CM

## 2020-10-28 ENCOUNTER — Encounter: Payer: Self-pay | Admitting: Emergency Medicine

## 2020-10-28 ENCOUNTER — Ambulatory Visit (INDEPENDENT_AMBULATORY_CARE_PROVIDER_SITE_OTHER): Payer: Medicare Other | Admitting: Emergency Medicine

## 2020-10-28 ENCOUNTER — Other Ambulatory Visit: Payer: Self-pay

## 2020-10-28 VITALS — BP 130/77 | HR 68 | Temp 97.7°F | Resp 16 | Ht 63.0 in | Wt 177.0 lb

## 2020-10-28 DIAGNOSIS — E1159 Type 2 diabetes mellitus with other circulatory complications: Secondary | ICD-10-CM | POA: Diagnosis not present

## 2020-10-28 DIAGNOSIS — Z13228 Encounter for screening for other metabolic disorders: Secondary | ICD-10-CM

## 2020-10-28 DIAGNOSIS — M159 Polyosteoarthritis, unspecified: Secondary | ICD-10-CM

## 2020-10-28 DIAGNOSIS — E1169 Type 2 diabetes mellitus with other specified complication: Secondary | ICD-10-CM

## 2020-10-28 DIAGNOSIS — E785 Hyperlipidemia, unspecified: Secondary | ICD-10-CM

## 2020-10-28 DIAGNOSIS — M8589 Other specified disorders of bone density and structure, multiple sites: Secondary | ICD-10-CM

## 2020-10-28 DIAGNOSIS — Z0001 Encounter for general adult medical examination with abnormal findings: Secondary | ICD-10-CM

## 2020-10-28 DIAGNOSIS — Z0000 Encounter for general adult medical examination without abnormal findings: Secondary | ICD-10-CM

## 2020-10-28 DIAGNOSIS — Z1329 Encounter for screening for other suspected endocrine disorder: Secondary | ICD-10-CM

## 2020-10-28 DIAGNOSIS — E669 Obesity, unspecified: Secondary | ICD-10-CM

## 2020-10-28 DIAGNOSIS — Z8739 Personal history of other diseases of the musculoskeletal system and connective tissue: Secondary | ICD-10-CM

## 2020-10-28 DIAGNOSIS — I152 Hypertension secondary to endocrine disorders: Secondary | ICD-10-CM

## 2020-10-28 DIAGNOSIS — Z13220 Encounter for screening for lipoid disorders: Secondary | ICD-10-CM

## 2020-10-28 DIAGNOSIS — Z130 Encounter for screening for diseases of the blood and blood-forming organs and certain disorders involving the immune mechanism: Secondary | ICD-10-CM

## 2020-10-28 DIAGNOSIS — Z1211 Encounter for screening for malignant neoplasm of colon: Secondary | ICD-10-CM

## 2020-10-28 NOTE — Patient Instructions (Addendum)
   If you have lab work done today you will be contacted with your lab results within the next 2 weeks.  If you have not heard from us then please contact us. The fastest way to get your results is to register for My Chart.   IF you received an x-ray today, you will receive an invoice from [facility]. Please contact [facility] at [phone] with questions or concerns regarding your invoice.   IF you received labwork today, you will receive an invoice from [facility]. Please contact [facility] at [phone] with questions or concerns regarding your invoice.   Our billing staff will not be able to assist you with questions regarding bills from these companies.  You will be contacted with the lab results as soon as they are available. The fastest way to get your results is to activate your My Chart account. Instructions are located on the last page of this paperwork. If you have not heard from us regarding the results in 2 weeks, please contact this office.     Health Maintenance, Female Adopting a healthy lifestyle and getting preventive care are important in promoting health and wellness. Ask your health care provider about:  The right schedule for you to have regular tests and exams.  Things you can do on your own to prevent diseases and keep yourself healthy. What should I know about diet, weight, and exercise? Eat a healthy diet  Eat a diet that includes plenty of vegetables, fruits, low-fat dairy products, and lean protein.  Do not eat a lot of foods that are high in solid fats, added sugars, or sodium.   Maintain a healthy weight Body mass index (BMI) is used to identify weight problems. It estimates body fat based on height and weight. Your health care provider can help determine your BMI and help you achieve or maintain a healthy weight. Get regular exercise Get regular exercise. This is one of the most important things you can do for your health. Most  adults should:  Exercise for at least 150 minutes each week. The exercise should increase your heart rate and make you sweat (moderate-intensity exercise).  Do strengthening exercises at least twice a week. This is in addition to the moderate-intensity exercise.  Spend less time sitting. Even light physical activity can be beneficial. Watch cholesterol and blood lipids Have your blood tested for lipids and cholesterol at 74 years of age, then have this test every 5 years. Have your cholesterol levels checked more often if:  Your lipid or cholesterol levels are high.  You are older than 74 years of age.  You are at high risk for heart disease. What should I know about cancer screening? Depending on your health history and family history, you may need to have cancer screening at various ages. This may include screening for:  Breast cancer.  Cervical cancer.  Colorectal cancer.  Skin cancer.  Lung cancer. What should I know about heart disease, diabetes, and high blood pressure? Blood pressure and heart disease  High blood pressure causes heart disease and increases the risk of stroke. This is more likely to develop in people who have high blood pressure readings, are of African descent, or are overweight.  Have your blood pressure checked: ? Every 3-5 years if you are 18-39 years of age. ? Every year if you are 40 years old or older. Diabetes Have regular diabetes screenings. This checks your fasting blood sugar level. Have the screening done:  Once every   three years after age 40 if you are at a normal weight and have a low risk for diabetes.  More often and at a younger age if you are overweight or have a high risk for diabetes. What should I know about preventing infection? Hepatitis B If you have a higher risk for hepatitis B, you should be screened for this virus. Talk with your health care provider to find out if you are at risk for hepatitis B infection. Hepatitis  C Testing is recommended for:  Everyone born from 1945 through 1965.  Anyone with known risk factors for hepatitis C. Sexually transmitted infections (STIs)  Get screened for STIs, including gonorrhea and chlamydia, if: ? You are sexually active and are younger than 74 years of age. ? You are older than 74 years of age and your health care provider tells you that you are at risk for this type of infection. ? Your sexual activity has changed since you were last screened, and you are at increased risk for chlamydia or gonorrhea. Ask your health care provider if you are at risk.  Ask your health care provider about whether you are at high risk for HIV. Your health care provider may recommend a prescription medicine to help prevent HIV infection. If you choose to take medicine to prevent HIV, you should first get tested for HIV. You should then be tested every 3 months for as long as you are taking the medicine. Pregnancy  If you are about to stop having your period (premenopausal) and you may become pregnant, seek counseling before you get pregnant.  Take 400 to 800 micrograms (mcg) of folic acid every day if you become pregnant.  Ask for birth control (contraception) if you want to prevent pregnancy. Osteoporosis and menopause Osteoporosis is a disease in which the bones lose minerals and strength with aging. This can result in bone fractures. If you are 65 years old or older, or if you are at risk for osteoporosis and fractures, ask your health care provider if you should:  Be screened for bone loss.  Take a calcium or vitamin D supplement to lower your risk of fractures.  Be given hormone replacement therapy (HRT) to treat symptoms of menopause. Follow these instructions at home: Lifestyle  Do not use any products that contain nicotine or tobacco, such as cigarettes, e-cigarettes, and chewing tobacco. If you need help quitting, ask your health care provider.  Do not use street  drugs.  Do not share needles.  Ask your health care provider for help if you need support or information about quitting drugs. Alcohol use  Do not drink alcohol if: ? Your health care provider tells you not to drink. ? You are pregnant, may be pregnant, or are planning to become pregnant.  If you drink alcohol: ? Limit how much you use to 0-1 drink a day. ? Limit intake if you are breastfeeding.  Be aware of how much alcohol is in your drink. In the [place] one drink equals one 12 oz bottle of beer (355 mL), one 5 oz glass of wine (148 mL), or one 1 oz glass of hard liquor (44 mL). General instructions  Schedule regular health, dental, and eye exams.  Stay current with your vaccines.  Tell your health care provider if: ? You often feel depressed. ? You have ever been abused or do not feel safe at home. Summary  Adopting a healthy lifestyle and getting preventive care are important in promoting health and wellness.    Follow your health care provider's instructions about healthy diet, exercising, and getting tested or screened for diseases.  Follow your health care provider's instructions on monitoring your cholesterol and blood pressure. This information is not intended to replace advice given to you by your health care provider. Make sure you discuss any questions you have with your health care provider. Document Revised: 08/02/2018 Document Reviewed: 08/02/2018 Elsevier Patient Education  2021 [employer]  

## 2020-10-28 NOTE — Progress Notes (Signed)
Isabella Dixon 75 y.o.   Chief Complaint  Patient presents with  . Annual Exam    HISTORY OF PRESENT ILLNESS: This is a 75 y.o. female here for annual exam. Has history of diabetes, hypertension, and dyslipidemia. Has history of osteoarthritis of multiple sites. History of osteopenia. History of hypothyroidism. History of gout.  On probenecid 3 times a day.  Last uric acid level within normal limits. Has intermittent rectal bleeding.  Due for colonoscopy this year. No other complaints or medical concerns today.  HPI   Prior to Admission medications   Medication Sig Start Date End Date Taking? Authorizing Provider  chlorpheniramine (CHLOR-TRIMETON) 4 MG tablet Take 4 mg by mouth 2 (two) times daily as needed for allergies.   Yes [provider]  Cholecalciferol (VITAMIN D-3 PO) Take 2,000 Units by mouth daily.   Yes [provider]  gabapentin (NEURONTIN) 100 MG capsule Take 2 capsules (200 mg total) by mouth at bedtime. 07/31/20 10/29/20 Yes Chrishun Scheer, Ines Bloomer, MD  IBUPROFEN PO Take by mouth as needed.   Yes [provider]  losartan (COZAAR) 100 MG tablet TAKE 1 TABLET DAILY 10/12/20  Yes Horald Pollen, MD  metFORMIN (GLUCOPHAGE) 500 MG tablet TAKE 1 TABLET TWICE DAILY  WITH MEALS 10/12/20  Yes Uriah Trueba, Ines Bloomer, MD  metoprolol tartrate (LOPRESSOR) 25 MG tablet TAKE 1 TABLET TWICE A DAY 10/12/20  Yes Shree Espey, Ines Bloomer, MD  MITIGARE 0.6 MG CAPS Take 2 capsules by mouth daily as needed. 06/22/19  Yes Deveshwar, Abel Presto, MD  Multiple Vitamin (MULTIVITAMIN) tablet Take 1 tablet by mouth daily.   Yes [provider]  probenecid (BENEMID) 500 MG tablet Take 1 tablet (500 mg total) by mouth in the morning, at noon, and at bedtime. 09/18/20  Yes Ofilia Neas, PA-C  rosuvastatin (CRESTOR) 10 MG tablet TAKE 1 TABLET DAILY 10/12/20  Yes Rosemae Mcquown, Ines Bloomer, MD  vitamin C (ASCORBIC ACID) 250 MG tablet Take 250 mg by mouth daily.   Yes  [provider]  levothyroxine (SYNTHROID) 75 MCG tablet Take 1 tablet (75 mcg total) by mouth daily. 10/31/19 07/02/20  Horald Pollen, MD    No Known Allergies  Patient Active Problem List   Diagnosis Date Noted  . Primary osteoarthritis of both hands 06/21/2019  . Primary osteoarthritis of both feet 06/21/2019  . Primary osteoarthritis of both knees 06/21/2019  . History of sciatica 09/06/2017  . Dyslipidemia associated with type 2 diabetes mellitus ([place]) 09/06/2017  . History of gout 09/06/2017  . Diabetes mellitus type 2 in obese ([place]) 05/13/2015  . Hypothyroid 07/25/2012  . Gouty arthritis 05/02/2012  . Osteoarthritis 05/02/2012  . Hypertension associated with diabetes ([place]) 05/02/2012  . Hyperlipidemia 05/02/2012    Past Medical History:  Diagnosis Date  . Anemia   . Arthritis   . Diabetes mellitus without complication ([place])   . Gout   . High cholesterol   . Hypertension   . Osteoporosis     Past Surgical History:  Procedure Laterality Date  . ABDOMINAL HYSTERECTOMY    . TUBAL LIGATION      Social History   Socioeconomic History  . Marital status: Married    Spouse name: Not on file  . Number of children: Not on file  . Years of education: Not on file  . Highest education level: Not on file  Occupational History  . Not on file  Tobacco Use  . Smoking status: Never Smoker  . Smokeless tobacco: Never  Used  Vaping Use  . Vaping Use: Never used  Substance and Sexual Activity  . Alcohol use: No  . Drug use: No  . Sexual activity: Yes  Other Topics Concern  . Not on file  Social History Narrative  . Not on file   Social Determinants of Health   Financial Resource Strain: Not on file  Food Insecurity: Not on file  Transportation Needs: Not on file  Physical Activity: Not on file  Stress: Not on file  Social Connections: Not on file  Intimate Partner Violence: Not on file    Family History  Problem Relation Age of Onset  .  Heart disease Mother   . Stroke Mother   . Hypertension Mother   . Stroke Father   . Colon cancer Sister   . SIDS Son   . Stroke Paternal Grandmother   . Cancer Paternal Grandmother   . ALS Brother   . Stroke Maternal Grandmother   . Cancer Paternal Grandfather   . Testicular cancer Son   . Down syndrome Son   . Heart disease Brother      Review of Systems  Constitutional: Negative.  Negative for chills and fever.  HENT: Negative.  Negative for congestion and sore throat.   Respiratory: Negative.  Negative for cough and shortness of breath.   Cardiovascular: Negative.  Negative for chest pain and palpitations.  Gastrointestinal: Negative.  Negative for abdominal pain, blood in stool, diarrhea, melena, nausea and vomiting.  Genitourinary: Negative.  Negative for dysuria and hematuria.  Musculoskeletal: Positive for joint pain.  Skin: Negative.  Negative for rash.  Neurological: Negative.  Negative for dizziness and headaches.  All other systems reviewed and are negative.   Today's Vitals   10/28/20 0845  BP: 130/77  Pulse: 68  Resp: 16  Temp: 97.7 F (36.5 C)  TempSrc: Temporal  SpO2: 98%  Weight: 177 lb (80.3 kg)  Height: '5\' 3"'  (1.6 m)   Body mass index is 31.35 kg/m. Wt Readings from Last 3 Encounters:  10/28/20 177 lb (80.3 kg)  07/02/20 175 lb (79.4 kg)  04/30/20 181 lb (82.1 kg)    Physical Exam Vitals reviewed.  Constitutional:      Appearance: Normal appearance.  HENT:     Head: Normocephalic.     Mouth/Throat:     Mouth: Mucous membranes are moist.     Pharynx: Oropharynx is clear.  Eyes:     Extraocular Movements: Extraocular movements intact.     Conjunctiva/sclera: Conjunctivae normal.     Pupils: Pupils are equal, round, and reactive to light.  Cardiovascular:     Rate and Rhythm: Normal rate and regular rhythm.     Pulses: Normal pulses.     Heart sounds: Normal heart sounds.  Pulmonary:     Effort: Pulmonary effort is normal.      Breath sounds: Normal breath sounds.  Abdominal:     General: Bowel sounds are normal. There is no distension.     Palpations: Abdomen is soft.     Tenderness: There is no abdominal tenderness.  Musculoskeletal:        General: Normal range of motion.     Right lower leg: No edema.     Left lower leg: No edema.     Comments: Osteoarthritis deformities of both hands PIP and DIP joints  Skin:    General: Skin is warm and dry.     Capillary Refill: Capillary refill takes less than 2 seconds.  Neurological:  General: No focal deficit present.     Mental Status: She is alert and oriented to person, place, and time.  Psychiatric:        Mood and Affect: Mood normal.        Behavior: Behavior normal.      ASSESSMENT & PLAN: Raysa was seen today for annual exam.  Diagnoses and all orders for this visit:  Routine general medical examination at a health care facility  Obesity, diabetes, and hypertension syndrome ([place]) -     CMP14+EGFR -     Hemoglobin A1c -     Lipid panel  Hypertension associated with diabetes ([place])  Dyslipidemia associated with type 2 diabetes mellitus ([place]) -     CBC with Differential/Platelet  History of gout  Osteoarthritis of multiple joints, unspecified osteoarthritis type  Osteopenia of multiple sites  Colon cancer screening -     Ambulatory referral to Gastroenterology  Screening for deficiency anemia  Screening for lipoid disorders  Screening for endocrine, metabolic and immunity disorder    Patient Instructions       If you have lab work done today you will be contacted with your lab results within the next 2 weeks.  If you have not heard from [place] then please contact us. The fastest way to get your results is to register for My Chart.   IF you received an x-ray today, you will receive an invoice from [facility]. Please contact [facility] at [phone] with questions or concerns regarding your invoice.   IF  you received labwork today, you will receive an invoice from [facility]. Please contact [facility] at [phone] with questions or concerns regarding your invoice.   Our billing staff will not be able to assist you with questions regarding bills from these companies.  You will be contacted with the lab results as soon as they are available. The fastest way to get your results is to activate your My Chart account. Instructions are located on the last page of this paperwork. If you have not heard from [place] regarding the results in 2 weeks, please contact this office.      Health Maintenance, Female Adopting a healthy lifestyle and getting preventive care are important in promoting health and wellness. Ask your health care provider about:  The right schedule for you to have regular tests and exams.  Things you can do on your own to prevent diseases and keep yourself healthy. What should I know about diet, weight, and exercise? Eat a healthy diet  Eat a diet that includes plenty of vegetables, fruits, low-fat dairy products, and lean protein.  Do not eat a lot of foods that are high in solid fats, added sugars, or sodium.   Maintain a healthy weight Body mass index (BMI) is used to identify weight problems. It estimates body fat based on height and weight. Your health care provider can help determine your BMI and help you achieve or maintain a healthy weight. Get regular exercise Get regular exercise. This is one of the most important things you can do for your health. Most adults should:  Exercise for at least 150 minutes each week. The exercise should increase your heart rate and make you sweat (moderate-intensity exercise).  Do strengthening exercises at least twice a week. This is in addition to the moderate-intensity exercise.  Spend less time sitting. Even light physical activity can be beneficial. Watch cholesterol and blood lipids Have your blood tested for lipids and cholesterol at  74 years  of age, then have this test every 5 years. Have your cholesterol levels checked more often if:  Your lipid or cholesterol levels are high.  You are older than 74 years of age.  You are at high risk for heart disease. What should I know about cancer screening? Depending on your health history and family history, you may need to have cancer screening at various ages. This may include screening for:  Breast cancer.  Cervical cancer.  Colorectal cancer.  Skin cancer.  Lung cancer. What should I know about heart disease, diabetes, and high blood pressure? Blood pressure and heart disease  High blood pressure causes heart disease and increases the risk of stroke. This is more likely to develop in people who have high blood pressure readings, are of African descent, or are overweight.  Have your blood pressure checked: ? Every 3-5 years if you are 72-52 years of age. ? Every year if you are 89 years old or older. Diabetes Have regular diabetes screenings. This checks your fasting blood sugar level. Have the screening done:  Once every three years after age 58 if you are at a normal weight and have a low risk for diabetes.  More often and at a younger age if you are overweight or have a high risk for diabetes. What should I know about preventing infection? Hepatitis B If you have a higher risk for hepatitis B, you should be screened for this virus. Talk with your health care provider to find out if you are at risk for hepatitis B infection. Hepatitis C Testing is recommended for:  Everyone born from [birth year] through 1964.  Anyone with known risk factors for hepatitis C. Sexually transmitted infections (STIs)  Get screened for STIs, including gonorrhea and chlamydia, if: ? You are sexually active and are younger than 74 years of age. ? You are older than 74 years of age and your health care provider tells you that you are at risk for this type of infection. ? Your sexual  activity has changed since you were last screened, and you are at increased risk for chlamydia or gonorrhea. Ask your health care provider if you are at risk.  Ask your health care provider about whether you are at high risk for HIV. Your health care provider may recommend a prescription medicine to help prevent HIV infection. If you choose to take medicine to prevent HIV, you should first get tested for HIV. You should then be tested every 3 months for as long as you are taking the medicine. Pregnancy  If you are about to stop having your period (premenopausal) and you may become pregnant, seek counseling before you get pregnant.  Take 400 to 800 micrograms (mcg) of folic acid every day if you become pregnant.  Ask for birth control (contraception) if you want to prevent pregnancy. Osteoporosis and menopause Osteoporosis is a disease in which the bones lose minerals and strength with aging. This can result in bone fractures. If you are 96 years old or older, or if you are at risk for osteoporosis and fractures, ask your health care provider if you should:  Be screened for bone loss.  Take a calcium or vitamin D supplement to lower your risk of fractures.  Be given hormone replacement therapy (HRT) to treat symptoms of menopause. Follow these instructions at home: Lifestyle  Do not use any products that contain nicotine or tobacco, such as cigarettes, e-cigarettes, and chewing tobacco. If you need help quitting, ask your health  care provider.  Do not use street drugs.  Do not share needles.  Ask your health care provider for help if you need support or information about quitting drugs. Alcohol use  Do not drink alcohol if: ? Your health care provider tells you not to drink. ? You are pregnant, may be pregnant, or are planning to become pregnant.  If you drink alcohol: ? Limit how much you use to 0-1 drink a day. ? Limit intake if you are breastfeeding.  Be aware of how much  alcohol is in your drink. In the [place] one drink equals one 12 oz bottle of beer (355 mL), one 5 oz glass of wine (148 mL), or one 1 oz glass of hard liquor (44 mL). General instructions  Schedule regular health, dental, and eye exams.  Stay current with your vaccines.  Tell your health care provider if: ? You often feel depressed. ? You have ever been abused or do not feel safe at home. Summary  Adopting a healthy lifestyle and getting preventive care are important in promoting health and wellness.  Follow your health care provider's instructions about healthy diet, exercising, and getting tested or screened for diseases.  Follow your health care provider's instructions on monitoring your cholesterol and blood pressure. This information is not intended to replace advice given to you by your health care provider. Make sure you discuss any questions you have with your health care provider. Document Revised: 08/02/2018 Document Reviewed: 08/02/2018 Elsevier Patient Education  2021 Elsevier Inc.      Agustina Caroli, MD [facility]

## 2020-10-29 LAB — LIPID PANEL
Chol/HDL Ratio: 3.6 ratio (ref 0.0–4.4)
Cholesterol, Total: 133 mg/dL (ref 100–199)
HDL: 37 mg/dL — ABNORMAL LOW (ref >39)
LDL Chol Calc (NIH): 58 mg/dL (ref 0–99)
Triglycerides: 239 mg/dL — ABNORMAL HIGH (ref 0–149)
VLDL Cholesterol Cal: 38 mg/dL (ref 5–40)

## 2020-10-29 LAB — CMP14+EGFR
ALT: 17 IU/L (ref 0–32)
AST: 18 IU/L (ref 0–40)
Albumin/Globulin Ratio: 1.3 (ref 1.2–2.2)
Albumin: 4.5 g/dL (ref 3.7–4.7)
Alkaline Phosphatase: 102 IU/L (ref 44–121)
BUN/Creatinine Ratio: 15 (ref 12–28)
BUN: 12 mg/dL (ref 8–27)
Bilirubin Total: 0.4 mg/dL (ref 0.0–1.2)
CO2: 23 mmol/L (ref 20–29)
Calcium: 9.5 mg/dL (ref 8.7–10.3)
Chloride: 99 mmol/L (ref 96–106)
Creatinine, Ser: 0.82 mg/dL (ref 0.57–1.00)
Globulin, Total: 3.6 g/dL (ref 1.5–4.5)
Glucose: 104 mg/dL — ABNORMAL HIGH (ref 65–99)
Potassium: 4.3 mmol/L (ref 3.5–5.2)
Sodium: 141 mmol/L (ref 134–144)
Total Protein: 8.1 g/dL (ref 6.0–8.5)
eGFR: 75 mL/min/{1.73_m2} (ref >59)

## 2020-10-29 LAB — CBC WITH DIFFERENTIAL/PLATELET
Basophils Absolute: 0.1 10*3/uL (ref 0.0–0.2)
Basos: 1 %
EOS (ABSOLUTE): 0.1 10*3/uL (ref 0.0–0.4)
Eos: 2 %
Hematocrit: 42.2 % (ref 34.0–46.6)
Hemoglobin: 13.6 g/dL (ref 11.1–15.9)
Immature Grans (Abs): 0 10*3/uL (ref 0.0–0.1)
Immature Granulocytes: 0 %
Lymphocytes Absolute: 2.5 10*3/uL (ref 0.7–3.1)
Lymphs: 41 %
MCH: 29.5 pg (ref 26.6–33.0)
MCHC: 32.2 g/dL (ref 31.5–35.7)
MCV: 92 fL (ref 79–97)
Monocytes Absolute: 0.5 10*3/uL (ref 0.1–0.9)
Monocytes: 9 %
Neutrophils Absolute: 2.9 10*3/uL (ref 1.4–7.0)
Neutrophils: 47 %
Platelets: 212 10*3/uL (ref 150–450)
RBC: 4.61 x10E6/uL (ref 3.77–5.28)
RDW: 13.2 % (ref 11.7–15.4)
WBC: 6 10*3/uL (ref 3.4–10.8)

## 2020-10-29 LAB — HEMOGLOBIN A1C
Est. average glucose Bld gHb Est-mCnc: 117 mg/dL
Hgb A1c MFr Bld: 5.7 % — ABNORMAL HIGH (ref 4.8–5.6)

## 2020-10-30 ENCOUNTER — Telehealth: Payer: Self-pay

## 2020-10-30 NOTE — Telephone Encounter (Signed)
-----   Message from [facility], MD sent at 10/29/2020 10:32 AM EST ----- Call patient please.  Unremarkable labs with acceptable values.  Thanks.

## 2020-10-31 ENCOUNTER — Telehealth: Payer: Self-pay | Admitting: Emergency Medicine

## 2020-10-31 DIAGNOSIS — E038 Other specified hypothyroidism: Secondary | ICD-10-CM

## 2020-11-04 ENCOUNTER — Other Ambulatory Visit: Payer: Self-pay

## 2020-11-04 ENCOUNTER — Telehealth: Payer: Self-pay | Admitting: Emergency Medicine

## 2020-11-04 DIAGNOSIS — E038 Other specified hypothyroidism: Secondary | ICD-10-CM

## 2020-11-04 MED ORDER — LEVOTHYROXINE SODIUM 75 MCG PO TABS: 75.0000 ug | ORAL_TABLET | Freq: Every day | ORAL | 1 refills | Status: DC

## 2020-11-04 NOTE — Telephone Encounter (Signed)
Received call from patient to follow up on Rx request for  SYNTHROID 75 MCG tablet [[id number]]  Pharmacy:  [facility] - [place], [place] - [address] AT [facility]  [address] [place], [place] [address]  Phone:  [phone] Fax:  [phone]   Patient stated pharmacy needs provider's approval to give refill.   Please advise at [phone]

## 2020-11-04 NOTE — Telephone Encounter (Signed)
Pt called and reported that [facility] and was told that they do not have this Rx on file please advise   [phone]

## 2020-11-04 NOTE — Telephone Encounter (Signed)
Resent a few min ago.

## 2020-11-19 ENCOUNTER — Other Ambulatory Visit: Payer: Self-pay | Admitting: Rheumatology

## 2020-11-20 NOTE — Telephone Encounter (Signed)
Next Visit: 01/14/2021  Last Visit: 07/02/2020  Last Fill: 06/22/2019  DX: Gouty arthritis  Current Dose per office note 07/02/2020, Mitigare BID as needed during gout flares  Labs: 10/28/2020, Glucose 104, A1C 5.7   Okay to refill Mitigare?

## 2020-12-24 ENCOUNTER — Other Ambulatory Visit: Payer: Self-pay

## 2020-12-24 MED ORDER — PROBENECID 500 MG PO TABS: 500.0000 mg | ORAL_TABLET | Freq: Three times a day (TID) | ORAL | 0 refills | Status: DC

## 2020-12-24 NOTE — Telephone Encounter (Signed)
Patient requested the Probenecid prescription be sent to [facility].   She also requested a 90 day supply.

## 2020-12-24 NOTE — Telephone Encounter (Signed)
Next Visit: 01/14/2021  Last Visit: 07/02/2020  Last Fill: 09/18/2020  DX: Gouty arthritis  Current Dose per office note 07/02/2020,  probenecid 500 mg 3 times daily Labs: 10/28/2020, Glucose 104, A1C 5.7, Triglycerides 239, HDL 37,   Okay to refill Probenecid?

## 2020-12-24 NOTE — Telephone Encounter (Signed)
Patient left a voicemail requesting prescription refill of Probenecid.

## 2020-12-31 ENCOUNTER — Ambulatory Visit: Payer: Medicare Other | Admitting: Rheumatology

## 2020-12-31 NOTE — Progress Notes (Addendum)
Office Visit Note  Patient: Isabella Dixon             Date of Birth: 10-30-1945           MRN: [id number]             PCP: Georgina Quint, MD Referring: Georgina Quint, * Visit Date: 01/14/2021 Occupation: @GUAROCC @  Subjective:  Other (Patient reports several gout flares recently. )   History of Present Illness: Isabella Dixon is a 75 y.o. female with history of gout and osteoarthritis.  She returns today after her last visit.  She states she has had 2 episodes of gout since her last visit.  She describes that on April 21 her right clavicle was swollen which responded to Mitigare and then she had another episode in her left wrist on March 9.  She states her wrist was swollen and it responded to Mitigare.  Complains of increased discomfort in her feet.  She has been watching her diet.  Activities of Daily Living:  Patient reports morning stiffness for 5 minutes.   Patient Denies nocturnal pain.  Difficulty dressing/grooming: Denies Difficulty climbing stairs: Denies Difficulty getting out of chair: Denies Difficulty using hands for taps, buttons, cutlery, and/or writing: Denies  Review of Systems  Constitutional: Negative for fatigue.  HENT: Negative for mouth sores, mouth dryness and nose dryness.   Eyes: Positive for dryness. Negative for pain and itching.  Respiratory: Negative for shortness of breath and difficulty breathing.   Cardiovascular: Negative for chest pain and palpitations.  Gastrointestinal: Negative for blood in stool, constipation and diarrhea.  Endocrine: Negative for increased urination.  Genitourinary: Negative for difficulty urinating.  Musculoskeletal: Positive for joint swelling and morning stiffness. Negative for arthralgias, joint pain, myalgias, muscle tenderness and myalgias.  Skin: Negative for color change, rash and redness.  Allergic/Immunologic: Negative for susceptible to infections.  Neurological: Negative for  dizziness, numbness, headaches, memory loss and weakness.  Hematological: Negative for bruising/bleeding tendency.  Psychiatric/Behavioral: Negative for confusion.    PMFS History:  Patient Active Problem List   Diagnosis Date Noted  . Primary osteoarthritis of both hands 06/21/2019  . Primary osteoarthritis of both feet 06/21/2019  . Primary osteoarthritis of both knees 06/21/2019  . History of sciatica 09/06/2017  . Dyslipidemia associated with type 2 diabetes mellitus (HCC) 09/06/2017  . History of gout 09/06/2017  . Diabetes mellitus type 2 in obese (HCC) 05/13/2015  . Hypothyroid 07/25/2012  . Gouty arthritis 05/02/2012  . Osteoarthritis 05/02/2012  . Hypertension associated with diabetes (HCC) 05/02/2012  . Hyperlipidemia 05/02/2012    Past Medical History:  Diagnosis Date  . Anemia   . Arthritis   . Diabetes mellitus without complication (HCC)   . Gout   . High cholesterol   . Hypertension   . Osteoporosis     Family History  Problem Relation Age of Onset  . Heart disease Mother   . Stroke Mother   . Hypertension Mother   . Stroke Father   . Colon cancer Sister   . SIDS Son   . Stroke Paternal Grandmother   . Cancer Paternal Grandmother   . ALS Brother   . Stroke Maternal Grandmother   . Cancer Paternal Grandfather   . Testicular cancer Son   . Down syndrome Son   . Heart disease Brother    Past Surgical History:  Procedure Laterality Date  . ABDOMINAL HYSTERECTOMY    . TUBAL LIGATION     Social History  Social History Narrative  . Not on file   Immunization History  Administered Date(s) Administered  . Fluad Quad(high Dose 65+) 05/02/2019, 04/30/2020  . Influenza Split 05/02/2012  . Influenza, High Dose Seasonal PF 06/15/2018  . Influenza,inj,Quad PF,6+ Mos 05/08/2013, 04/30/2014, 05/13/2015, 05/17/2016, 09/06/2017  . PFIZER(Purple Top)SARS-COV-2 Vaccination 09/17/2019, 10/08/2019, 06/05/2020  . Pneumococcal Conjugate-13 05/13/2015  .  Pneumococcal Polysaccharide-23 11/01/2018  . Tdap 03/14/2008     Objective: Vital Signs: BP (!) 159/86 (BP Location: Left Arm, Patient Position: Sitting, Cuff Size: Normal)   Pulse 64   Resp 16   Ht 5\' 3"  (1.6 m)   Wt 179 lb (81.2 kg)   BMI 31.71 kg/m    Physical Exam Vitals and nursing note reviewed.  Constitutional:      Appearance: She is well-developed.  HENT:     Head: Normocephalic and atraumatic.  Eyes:     Conjunctiva/sclera: Conjunctivae normal.  Cardiovascular:     Rate and Rhythm: Normal rate and regular rhythm.     Heart sounds: Normal heart sounds.  Pulmonary:     Effort: Pulmonary effort is normal.     Breath sounds: Normal breath sounds.  Abdominal:     General: Bowel sounds are normal.     Palpations: Abdomen is soft.  Musculoskeletal:     Cervical back: Normal range of motion.  Lymphadenopathy:     Cervical: No cervical adenopathy.  Skin:    General: Skin is warm and dry.     Capillary Refill: Capillary refill takes less than 2 seconds.  Neurological:     Mental Status: She is alert and oriented to person, place, and time.  Psychiatric:        Behavior: Behavior normal.      Musculoskeletal Exam: C-spine was in good range of motion.  She had no tenderness or swelling over the sternoclavicular joint.  Shoulder joints with good range of motion.  Elbow joints with good range of motion.  She had no tenderness over right wrist joint.  Left wrist joint was swollen and had tenderness on palpation.  She has bilateral PIP and DIP thickening but no synovitis.  Hip joints with good range of motion.  There was no warmth swelling effusion over knee joints.  She had no swelling over ankles or MTPs.  CDAI Exam: CDAI Score: -- Patient Global: --; Provider Global: -- Swollen: --; Tender: -- Joint Exam 01/14/2021   No joint exam has been documented for this visit   There is currently no information documented on the homunculus. Go to the Rheumatology activity and  complete the homunculus joint exam.  Investigation: No additional findings.  Imaging: No results found.  Recent Labs: Lab Results  Component Value Date   WBC 6.0 10/28/2020   HGB 13.6 10/28/2020   PLT 212 10/28/2020   NA 141 10/28/2020   K 4.3 10/28/2020   CL 99 10/28/2020   CO2 23 10/28/2020   GLUCOSE 104 (H) 10/28/2020   BUN 12 10/28/2020   CREATININE 0.82 10/28/2020   BILITOT 0.4 10/28/2020   ALKPHOS 102 10/28/2020   AST 18 10/28/2020   ALT 17 10/28/2020   PROT 8.1 10/28/2020   ALBUMIN 4.5 10/28/2020   CALCIUM 9.5 10/28/2020   GFRAA 86 07/02/2020    Speciality Comments: Allopurinol- diarrhea  Procedures:  No procedures performed Allergies: Patient has no known allergies.   Assessment / Plan:     Visit Diagnoses: Pain in both hands -patient had a recent episode with increased pain and  swelling in her left wrist joint.  She states the symptoms started after mowing the lawn.  She still have some discomfort in her left wrist joint.  Some synovitis was noted in her left wrist.  Plan: Sedimentation rate, Rheumatoid factor, Cyclic citrul peptide antibody, IgG,XR Hand 2 View Right, XR Hand 2 View Left.  X-rays were consistent with osteoarthritis.  Primary osteoarthritis of both hands-she has bilateral PIP and DIP thickening.  Primary osteoarthritis of both knees-she has off-and-on discomfort but no swelling.  Pain in both feet -she complains of increased discomfort in her knee joints.  Plan:  XR Foot 2 Views Right, XR Foot 2 Views Left.  X-rays were consistent with osteoarthritis.  Primary osteoarthritis of both feet  DDD (degenerative disc disease), lumbar-she denies any discomfort currently.  Gouty arthritis -she has been on probenecid 500 mg 3 times daily, Mitigare BID as needed during gout flares.  She could not take allopurinol due to diarrhea in the past.  Uric acid had been in desirable range.- Plan: Uric acid  Patient reported cost as issue with Mitigare.  Pharmacist ran test claim and tablets are $14 per 30 day supply.  Osteopenia of multiple sites - DEXA done by PCP 09/28/19 T -2.3.   Type 2 diabetes mellitus without complication, without long-term current use of insulin (HCC)  History of hypothyroidism  Essential hypertension-her blood pressure is elevated  History of hyperlipidemia  Orders: Orders Placed This Encounter  Procedures  . XR Hand 2 View Right  . XR Hand 2 View Left  . XR Foot 2 Views Right  . XR Foot 2 Views Left  . Sedimentation rate  . Uric acid  . Rheumatoid factor  . Cyclic citrul peptide antibody, IgG   No orders of the defined types were placed in this encounter.     Follow-Up Instructions: Return in about 6 months (around 07/17/2021) for Gout, Osteoarthritis.   Pollyann Savoy, MD  Note - This record has been created using Animal nutritionist.  Chart creation errors have been sought, but may not always  have been located. Such creation errors do not reflect on  the standard of medical care.

## 2021-01-10 ENCOUNTER — Other Ambulatory Visit: Payer: Self-pay | Admitting: Emergency Medicine

## 2021-01-10 DIAGNOSIS — I10 Essential (primary) hypertension: Secondary | ICD-10-CM

## 2021-01-10 DIAGNOSIS — E785 Hyperlipidemia, unspecified: Secondary | ICD-10-CM

## 2021-01-10 DIAGNOSIS — E119 Type 2 diabetes mellitus without complications: Secondary | ICD-10-CM

## 2021-01-14 ENCOUNTER — Other Ambulatory Visit: Payer: Self-pay

## 2021-01-14 ENCOUNTER — Ambulatory Visit (INDEPENDENT_AMBULATORY_CARE_PROVIDER_SITE_OTHER): Payer: Medicare Other | Admitting: Rheumatology

## 2021-01-14 ENCOUNTER — Ambulatory Visit: Payer: Self-pay

## 2021-01-14 ENCOUNTER — Encounter: Payer: Self-pay | Admitting: Rheumatology

## 2021-01-14 ENCOUNTER — Other Ambulatory Visit (HOSPITAL_COMMUNITY): Payer: Self-pay

## 2021-01-14 VITALS — BP 159/86 | HR 64 | Resp 16 | Ht 63.0 in | Wt 179.0 lb

## 2021-01-14 DIAGNOSIS — M19041 Primary osteoarthritis, right hand: Secondary | ICD-10-CM

## 2021-01-14 DIAGNOSIS — M79671 Pain in right foot: Secondary | ICD-10-CM | POA: Diagnosis not present

## 2021-01-14 DIAGNOSIS — M8589 Other specified disorders of bone density and structure, multiple sites: Secondary | ICD-10-CM

## 2021-01-14 DIAGNOSIS — M79672 Pain in left foot: Secondary | ICD-10-CM | POA: Diagnosis not present

## 2021-01-14 DIAGNOSIS — M79641 Pain in right hand: Secondary | ICD-10-CM | POA: Diagnosis not present

## 2021-01-14 DIAGNOSIS — M19071 Primary osteoarthritis, right ankle and foot: Secondary | ICD-10-CM | POA: Diagnosis not present

## 2021-01-14 DIAGNOSIS — M19072 Primary osteoarthritis, left ankle and foot: Secondary | ICD-10-CM | POA: Diagnosis not present

## 2021-01-14 DIAGNOSIS — M109 Gout, unspecified: Secondary | ICD-10-CM

## 2021-01-14 DIAGNOSIS — M19042 Primary osteoarthritis, left hand: Secondary | ICD-10-CM | POA: Diagnosis not present

## 2021-01-14 DIAGNOSIS — M79642 Pain in left hand: Secondary | ICD-10-CM

## 2021-01-14 DIAGNOSIS — I10 Essential (primary) hypertension: Secondary | ICD-10-CM

## 2021-01-14 DIAGNOSIS — M5136 Other intervertebral disc degeneration, lumbar region: Secondary | ICD-10-CM

## 2021-01-14 DIAGNOSIS — M170 Bilateral primary osteoarthritis of knee: Secondary | ICD-10-CM

## 2021-01-14 DIAGNOSIS — Z8639 Personal history of other endocrine, nutritional and metabolic disease: Secondary | ICD-10-CM

## 2021-01-14 DIAGNOSIS — E119 Type 2 diabetes mellitus without complications: Secondary | ICD-10-CM

## 2021-01-14 MED ORDER — COLCHICINE 0.6 MG PO TABS: 1.2000 mg | ORAL_TABLET | Freq: Every day | ORAL | 0 refills | Status: DC | PRN

## 2021-01-14 NOTE — Addendum Note (Signed)
Addended by: Murrell Redden on: 01/14/2021 10:39 AM   Modules accepted: Orders

## 2021-01-15 LAB — CYCLIC CITRUL PEPTIDE ANTIBODY, IGG: Cyclic Citrullin Peptide Ab: <16 UNITS

## 2021-01-15 LAB — URIC ACID: Uric Acid, Serum: 4 mg/dL (ref 2.5–7.0)

## 2021-01-15 LAB — SEDIMENTATION RATE: Sed Rate: 19 mm/h (ref 0–30)

## 2021-01-15 LAB — RHEUMATOID FACTOR: Rheumatoid fact SerPl-aCnc: 16 IU/mL — ABNORMAL HIGH (ref <14)

## 2021-01-15 NOTE — Progress Notes (Signed)
Sed rate is normal, uric acid is normal, anti-CCP is negative, rheumatoid factor titer is mildly elevated.  If the swelling in her wrist persists she should call for an earlier appointment.

## 2021-03-04 NOTE — Progress Notes (Deleted)
Office Visit Note  Patient: Isabella Dixon             Date of Birth: 11-10-1945           MRN: [id number]             PCP: Georgina Quint, MD Referring: Georgina Quint, * Visit Date: 03/18/2021 Occupation: @GUAROCC @  Subjective:    History of Present Illness: Isabella Dixon is a 75 y.o. female with history of osteoarthritis, DDD, and gout. She takes probenecid 500 mg 3 times daily and Mitigare BID as needed during gout flares. She could not take allopurinol due to diarrhea in the past.  CBC and CMP updated on 10/28/20.   Activities of Daily Living:  Patient reports morning stiffness for *** {minute/hour:19697}.   Patient {ACTIONS;DENIES/REPORTS:21021675::"Denies"} nocturnal pain.  Difficulty dressing/grooming: {ACTIONS;DENIES/REPORTS:21021675::"Denies"} Difficulty climbing stairs: {ACTIONS;DENIES/REPORTS:21021675::"Denies"} Difficulty getting out of chair: {ACTIONS;DENIES/REPORTS:21021675::"Denies"} Difficulty using hands for taps, buttons, cutlery, and/or writing: {ACTIONS;DENIES/REPORTS:21021675::"Denies"}  No Rheumatology ROS completed.   PMFS History:  Patient Active Problem List   Diagnosis Date Noted   Primary osteoarthritis of both hands 06/21/2019   Primary osteoarthritis of both feet 06/21/2019   Primary osteoarthritis of both knees 06/21/2019   History of sciatica 09/06/2017   Dyslipidemia associated with type 2 diabetes mellitus (HCC) 09/06/2017   History of gout 09/06/2017   Diabetes mellitus type 2 in obese (HCC) 05/13/2015   Hypothyroid 07/25/2012   Gouty arthritis 05/02/2012   Osteoarthritis 05/02/2012   Hypertension associated with diabetes (HCC) 05/02/2012   Hyperlipidemia 05/02/2012    Past Medical History:  Diagnosis Date   Anemia    Arthritis    Diabetes mellitus without complication (HCC)    Gout    High cholesterol    Hypertension    Osteoporosis     Family History  Problem Relation Age of Onset   Heart disease  Mother    Stroke Mother    Hypertension Mother    Stroke Father    Colon cancer Sister    SIDS Son    Stroke Paternal Grandmother    Cancer Paternal Grandmother    ALS Brother    Stroke Maternal Grandmother    Cancer Paternal Grandfather    Testicular cancer Son    Down syndrome Son    Heart disease Brother    Past Surgical History:  Procedure Laterality Date   ABDOMINAL HYSTERECTOMY     TUBAL LIGATION     Social History   Social History Narrative   Not on file   Immunization History  Administered Date(s) Administered   Fluad Quad(high Dose 65+) 05/02/2019, 04/30/2020   Influenza Split 05/02/2012   Influenza, High Dose Seasonal PF 06/15/2018   Influenza,inj,Quad PF,6+ Mos 05/08/2013, 04/30/2014, 05/13/2015, 05/17/2016, 09/06/2017   PFIZER(Purple Top)SARS-COV-2 Vaccination 09/17/2019, 10/08/2019, 06/05/2020   Pneumococcal Conjugate-13 05/13/2015   Pneumococcal Polysaccharide-23 11/01/2018   Tdap 03/14/2008     Objective: Vital Signs: There were no vitals taken for this visit.   Physical Exam Vitals and nursing note reviewed.  Constitutional:      Appearance: She is well-developed.  HENT:     Head: Normocephalic and atraumatic.  Eyes:     Conjunctiva/sclera: Conjunctivae normal.  Pulmonary:     Effort: Pulmonary effort is normal.  Abdominal:     Palpations: Abdomen is soft.  Musculoskeletal:     Cervical back: Normal range of motion.  Skin:    General: Skin is warm and dry.     Capillary Refill:  Capillary refill takes less than 2 seconds.  Neurological:     Mental Status: She is alert and oriented to person, place, and time.  Psychiatric:        Behavior: Behavior normal.     Musculoskeletal Exam: ***  CDAI Exam: CDAI Score: -- Patient Global: --; Provider Global: -- Swollen: --; Tender: -- Joint Exam 03/18/2021   No joint exam has been documented for this visit   There is currently no information documented on the homunculus. Go to the  Rheumatology activity and complete the homunculus joint exam.  Investigation: No additional findings.  Imaging: No results found.  Recent Labs: Lab Results  Component Value Date   WBC 6.0 10/28/2020   HGB 13.6 10/28/2020   PLT 212 10/28/2020   NA 141 10/28/2020   K 4.3 10/28/2020   CL 99 10/28/2020   CO2 23 10/28/2020   GLUCOSE 104 (H) 10/28/2020   BUN 12 10/28/2020   CREATININE 0.82 10/28/2020   BILITOT 0.4 10/28/2020   ALKPHOS 102 10/28/2020   AST 18 10/28/2020   ALT 17 10/28/2020   PROT 8.1 10/28/2020   ALBUMIN 4.5 10/28/2020   CALCIUM 9.5 10/28/2020   GFRAA 86 07/02/2020    Speciality Comments: Allopurinol- diarrhea  Procedures:  No procedures performed Allergies: Patient has no known allergies.   Assessment / Plan:     Visit Diagnoses: No diagnosis found.  Orders: No orders of the defined types were placed in this encounter.  No orders of the defined types were placed in this encounter.   Face-to-face time spent with patient was *** minutes. Greater than 50% of time was spent in counseling and coordination of care.  Follow-Up Instructions: No follow-ups on file.   Ellen Henri, CMA  Note - This record has been created using Animal nutritionist.  Chart creation errors have been sought, but may not always  have been located. Such creation errors do not reflect on  the standard of medical care.

## 2021-03-18 ENCOUNTER — Ambulatory Visit: Payer: Medicare Other | Admitting: Physician Assistant

## 2021-03-18 DIAGNOSIS — M79641 Pain in right hand: Secondary | ICD-10-CM

## 2021-03-18 DIAGNOSIS — I10 Essential (primary) hypertension: Secondary | ICD-10-CM

## 2021-03-18 DIAGNOSIS — Z8639 Personal history of other endocrine, nutritional and metabolic disease: Secondary | ICD-10-CM

## 2021-03-18 DIAGNOSIS — M79671 Pain in right foot: Secondary | ICD-10-CM

## 2021-03-18 DIAGNOSIS — M19072 Primary osteoarthritis, left ankle and foot: Secondary | ICD-10-CM

## 2021-03-18 DIAGNOSIS — M109 Gout, unspecified: Secondary | ICD-10-CM

## 2021-03-18 DIAGNOSIS — M5136 Other intervertebral disc degeneration, lumbar region: Secondary | ICD-10-CM

## 2021-03-18 DIAGNOSIS — M8589 Other specified disorders of bone density and structure, multiple sites: Secondary | ICD-10-CM

## 2021-03-18 DIAGNOSIS — M170 Bilateral primary osteoarthritis of knee: Secondary | ICD-10-CM

## 2021-03-18 DIAGNOSIS — M19041 Primary osteoarthritis, right hand: Secondary | ICD-10-CM

## 2021-03-18 DIAGNOSIS — E119 Type 2 diabetes mellitus without complications: Secondary | ICD-10-CM

## 2021-03-24 ENCOUNTER — Other Ambulatory Visit: Payer: Self-pay | Admitting: Emergency Medicine

## 2021-03-24 ENCOUNTER — Other Ambulatory Visit: Payer: Self-pay | Admitting: Physician Assistant

## 2021-03-24 DIAGNOSIS — E038 Other specified hypothyroidism: Secondary | ICD-10-CM

## 2021-03-25 ENCOUNTER — Telehealth: Payer: Self-pay

## 2021-03-25 DIAGNOSIS — M79641 Pain in right hand: Secondary | ICD-10-CM

## 2021-03-25 DIAGNOSIS — M109 Gout, unspecified: Secondary | ICD-10-CM

## 2021-03-25 NOTE — Telephone Encounter (Signed)
Please schedule patient for a follow up visit. Patient was due July 2022. Thanks!  

## 2021-03-25 NOTE — Telephone Encounter (Signed)
Patient states she does not need a prescription for Colchicine. Patient states it is the Probenecid. Patient advised the prescription was sent into the pharmacy today for a 90 day supply.

## 2021-03-25 NOTE — Telephone Encounter (Signed)
Patient called requesting prescription refill of Colchicine (90 day supply).  Patient states her last prescription was only filled for 30 days and the price is the same for a 30 day and 90 day supply.

## 2021-03-25 NOTE — Telephone Encounter (Signed)
I LMOM for patient to call to schedule a follow up appointment with Ladona Ridgel.

## 2021-03-25 NOTE — Telephone Encounter (Signed)
Next Visit: due July 2022. Message sent to the front to schedule patient.   Last Visit: 10/17/2020  Last Fill: 12/24/2020   DX: Gouty arthritis  Current Dose per office note 01/14/2021: probenecid 500 mg 3 times daily,  Labs: 10/28/2020 Glucose 104, rest of CMP WNL, CBC WNL  Okay to refill Probenecid?

## 2021-04-03 ENCOUNTER — Other Ambulatory Visit: Payer: Self-pay | Admitting: Emergency Medicine

## 2021-04-03 DIAGNOSIS — E119 Type 2 diabetes mellitus without complications: Secondary | ICD-10-CM

## 2021-04-03 DIAGNOSIS — E785 Hyperlipidemia, unspecified: Secondary | ICD-10-CM

## 2021-04-03 DIAGNOSIS — I10 Essential (primary) hypertension: Secondary | ICD-10-CM

## 2021-05-05 ENCOUNTER — Ambulatory Visit (INDEPENDENT_AMBULATORY_CARE_PROVIDER_SITE_OTHER): Payer: Medicare Other | Admitting: Emergency Medicine

## 2021-05-05 ENCOUNTER — Other Ambulatory Visit: Payer: Self-pay

## 2021-05-05 ENCOUNTER — Encounter: Payer: Self-pay | Admitting: Emergency Medicine

## 2021-05-05 VITALS — BP 130/70 | HR 89 | Temp 98.2°F | Ht 63.0 in | Wt 174.0 lb

## 2021-05-05 DIAGNOSIS — E039 Hypothyroidism, unspecified: Secondary | ICD-10-CM

## 2021-05-05 DIAGNOSIS — Z8739 Personal history of other diseases of the musculoskeletal system and connective tissue: Secondary | ICD-10-CM

## 2021-05-05 DIAGNOSIS — Z23 Encounter for immunization: Secondary | ICD-10-CM

## 2021-05-05 DIAGNOSIS — E038 Other specified hypothyroidism: Secondary | ICD-10-CM

## 2021-05-05 DIAGNOSIS — E1159 Type 2 diabetes mellitus with other circulatory complications: Secondary | ICD-10-CM | POA: Diagnosis not present

## 2021-05-05 DIAGNOSIS — E785 Hyperlipidemia, unspecified: Secondary | ICD-10-CM

## 2021-05-05 DIAGNOSIS — I10 Essential (primary) hypertension: Secondary | ICD-10-CM

## 2021-05-05 DIAGNOSIS — M810 Age-related osteoporosis without current pathological fracture: Secondary | ICD-10-CM | POA: Insufficient documentation

## 2021-05-05 DIAGNOSIS — I152 Hypertension secondary to endocrine disorders: Secondary | ICD-10-CM

## 2021-05-05 DIAGNOSIS — E669 Obesity, unspecified: Secondary | ICD-10-CM | POA: Diagnosis not present

## 2021-05-05 DIAGNOSIS — O009 Unspecified ectopic pregnancy without intrauterine pregnancy: Secondary | ICD-10-CM | POA: Insufficient documentation

## 2021-05-05 DIAGNOSIS — M159 Polyosteoarthritis, unspecified: Secondary | ICD-10-CM

## 2021-05-05 DIAGNOSIS — E1169 Type 2 diabetes mellitus with other specified complication: Secondary | ICD-10-CM

## 2021-05-05 DIAGNOSIS — K659 Peritonitis, unspecified: Secondary | ICD-10-CM | POA: Insufficient documentation

## 2021-05-05 DIAGNOSIS — Z8669 Personal history of other diseases of the nervous system and sense organs: Secondary | ICD-10-CM

## 2021-05-05 DIAGNOSIS — E119 Type 2 diabetes mellitus without complications: Secondary | ICD-10-CM

## 2021-05-05 LAB — CBC WITH DIFFERENTIAL/PLATELET
Basophils Absolute: 0 10*3/uL (ref 0.0–0.1)
Basophils Relative: 0.7 % (ref 0.0–3.0)
Eosinophils Absolute: 0.2 10*3/uL (ref 0.0–0.7)
Eosinophils Relative: 3.1 % (ref 0.0–5.0)
HCT: 38.9 % (ref 36.0–46.0)
Hemoglobin: 13.1 g/dL (ref 12.0–15.0)
Lymphocytes Relative: 33.3 % (ref 12.0–46.0)
Lymphs Abs: 2.1 10*3/uL (ref 0.7–4.0)
MCHC: 33.7 g/dL (ref 30.0–36.0)
MCV: 90.7 fl (ref 78.0–100.0)
Monocytes Absolute: 0.7 10*3/uL (ref 0.1–1.0)
Monocytes Relative: 10.5 % (ref 3.0–12.0)
Neutro Abs: 3.3 10*3/uL (ref 1.4–7.7)
Neutrophils Relative %: 52.4 % (ref 43.0–77.0)
Platelets: 240 10*3/uL (ref 150.0–400.0)
RBC: 4.29 Mil/uL (ref 3.87–5.11)
RDW: 14 % (ref 11.5–15.5)
WBC: 6.3 10*3/uL (ref 4.0–10.5)

## 2021-05-05 LAB — COMPREHENSIVE METABOLIC PANEL
ALT: 15 U/L (ref 0–35)
AST: 18 U/L (ref 0–37)
Albumin: 4.1 g/dL (ref 3.5–5.2)
Alkaline Phosphatase: 75 U/L (ref 39–117)
BUN: 17 mg/dL (ref 6–23)
CO2: 29 mEq/L (ref 19–32)
Calcium: 9.6 mg/dL (ref 8.4–10.5)
Chloride: 101 mEq/L (ref 96–112)
Creatinine, Ser: 0.78 mg/dL (ref 0.40–1.20)
GFR: 74.92 mL/min (ref >60.00)
Glucose, Bld: 95 mg/dL (ref 70–99)
Potassium: 4 mEq/L (ref 3.5–5.1)
Sodium: 139 mEq/L (ref 135–145)
Total Bilirubin: 0.4 mg/dL (ref 0.2–1.2)
Total Protein: 8.3 g/dL (ref 6.0–8.3)

## 2021-05-05 LAB — HEMOGLOBIN A1C: Hgb A1c MFr Bld: 6.1 % (ref 4.6–6.5)

## 2021-05-05 LAB — LIPID PANEL
Cholesterol: 142 mg/dL (ref 0–200)
HDL: 39.7 mg/dL (ref >39.00)
NonHDL: 101.85
Total CHOL/HDL Ratio: 4
Triglycerides: 257 mg/dL — ABNORMAL HIGH (ref 0.0–149.0)
VLDL: 51.4 mg/dL — ABNORMAL HIGH (ref 0.0–40.0)

## 2021-05-05 LAB — LDL CHOLESTEROL, DIRECT: Direct LDL: 68 mg/dL

## 2021-05-05 LAB — TSH: TSH: 1.61 u[IU]/mL (ref 0.35–5.50)

## 2021-05-05 MED ORDER — METFORMIN HCL 500 MG PO TABS: 500.0000 mg | ORAL_TABLET | Freq: Two times a day (BID) | ORAL | 3 refills | Status: DC

## 2021-05-05 MED ORDER — GABAPENTIN 100 MG PO CAPS: 200.0000 mg | ORAL_CAPSULE | Freq: Every day | ORAL | 3 refills | Status: DC

## 2021-05-05 MED ORDER — PROBENECID 500 MG PO TABS: 500.0000 mg | ORAL_TABLET | Freq: Three times a day (TID) | ORAL | 3 refills | Status: DC

## 2021-05-05 MED ORDER — METOPROLOL TARTRATE 25 MG PO TABS: 25.0000 mg | ORAL_TABLET | Freq: Two times a day (BID) | ORAL | 3 refills | Status: DC

## 2021-05-05 MED ORDER — LOSARTAN POTASSIUM 100 MG PO TABS: 100.0000 mg | ORAL_TABLET | Freq: Every day | ORAL | 3 refills | Status: DC

## 2021-05-05 MED ORDER — LEVOTHYROXINE SODIUM 75 MCG PO TABS: 75.0000 ug | ORAL_TABLET | Freq: Every day | ORAL | 3 refills | Status: DC

## 2021-05-05 MED ORDER — ROSUVASTATIN CALCIUM 10 MG PO TABS: 10.0000 mg | ORAL_TABLET | Freq: Every day | ORAL | 3 refills | Status: DC

## 2021-05-05 NOTE — Assessment & Plan Note (Signed)
No recent flareups.  Takes probenecid 3 times a day.  Intolerant to allopurinol.  Colchicine when needed.  Stable.

## 2021-05-05 NOTE — Progress Notes (Signed)
Isabella Dixon 75 y.o.   Chief Complaint  Patient presents with   Hypertension    Follow up, medication refill, all on list    HISTORY OF PRESENT ILLNESS: This is a 75 y.o. female with history of hypertension here for follow-up and medication refill. Also has history of osteoarthritis of multiple joints and osteopenia and hypothyroidism. Sees rheumatologist on a regular basis. Aware she needs to schedule colonoscopy.  Was referred to [facility] in the past but prefers to see GI doctor who did her last colonoscopies. Last office visit assessment and plan as follows: Assessment / Plan:     Visit Diagnoses: Pain in both hands -patient had a recent episode with increased pain and swelling in her left wrist joint.  She states the symptoms started after mowing the lawn.  She still have some discomfort in her left wrist joint.  Some synovitis was noted in her left wrist.  Plan: Sedimentation rate, Rheumatoid factor, Cyclic citrul peptide antibody, IgG,XR Hand 2 View Right, XR Hand 2 View Left.  X-rays were consistent with osteoarthritis.   Primary osteoarthritis of both hands-she has bilateral PIP and DIP thickening.   Primary osteoarthritis of both knees-she has off-and-on discomfort but no swelling.   Pain in both feet -she complains of increased discomfort in her knee joints.  Plan:  XR Foot 2 Views Right, XR Foot 2 Views Left.  X-rays were consistent with osteoarthritis.   Primary osteoarthritis of both feet   DDD (degenerative disc disease), lumbar-she denies any discomfort currently.   Gouty arthritis -she has been on probenecid 500 mg 3 times daily, Mitigare BID as needed during gout flares.  She could not take allopurinol due to diarrhea in the past.  Uric acid had been in desirable range.- Plan: Uric acid   Patient reported cost as issue with Mitigare. Pharmacist ran test claim and tablets are $14 per 30 day supply.   Osteopenia of multiple sites - DEXA done by PCP 09/28/19 T  -2.3.    Type 2 diabetes mellitus without complication, without long-term current use of insulin (HCC)   History of hypothyroidism   Essential hypertension-her blood pressure is elevated   History of hyperlipidemia  Doing well.  Has no complaints or medical concerns today.  HPI   Prior to Admission medications   Medication Sig Start Date End Date Taking? Authorizing Provider  chlorpheniramine (CHLOR-TRIMETON) 4 MG tablet Take 4 mg by mouth 2 (two) times daily as needed for allergies.   Yes [provider]  Cholecalciferol (VITAMIN D-3 PO) Take 2,000 Units by mouth daily.   Yes [provider]  colchicine 0.6 MG tablet Take 2 tablets (1.2 mg total) by mouth daily as needed. 01/14/21  Yes Deveshwar, Janalyn Rouse, MD  gabapentin (NEURONTIN) 100 MG capsule Take 2 capsules (200 mg total) by mouth at bedtime. 07/31/20 05/05/21 Yes Milka Windholz, Eilleen Kempf, MD  IBUPROFEN PO Take by mouth as needed.   Yes [provider]  losartan (COZAAR) 100 MG tablet TAKE 1 TABLET DAILY 04/03/21  Yes Maekayla Giorgio, Eilleen Kempf, MD  metFORMIN (GLUCOPHAGE) 500 MG tablet TAKE 1 TABLET TWICE DAILY  WITH MEALS 04/03/21  Yes Preciliano Castell, Eilleen Kempf, MD  metoprolol tartrate (LOPRESSOR) 25 MG tablet TAKE 1 TABLET TWICE A DAY 04/03/21  Yes Yasha Tibbett, Eilleen Kempf, MD  Multiple Vitamin (MULTIVITAMIN) tablet Take 1 tablet by mouth daily.   Yes [provider]  probenecid (BENEMID) 500 MG tablet TAKE 1 TABLET EVERY  MORNING, AT NOON, AND AT   BEDTIME 03/25/21  Yes Gearldine Bienenstock, PA-C  rosuvastatin (CRESTOR) 10 MG tablet TAKE 1 TABLET DAILY 04/03/21  Yes Georgina Quint, MD  SYNTHROID 75 MCG tablet TAKE 1 TABLET DAILY 03/25/21  Yes Georgina Quint, MD  vitamin C (ASCORBIC ACID) 250 MG tablet Take 250 mg by mouth daily.   Yes [provider]    No Known Allergies  Patient Active Problem List   Diagnosis Date Noted   Peritonitis (HCC) 05/05/2021   Osteoporosis 05/05/2021   Ectopic  pregnancy 05/05/2021   Primary osteoarthritis of both hands 06/21/2019   Primary osteoarthritis of both feet 06/21/2019   Primary osteoarthritis of both knees 06/21/2019   History of sciatica 09/06/2017   Obesity, diabetes, and hypertension syndrome (HCC) 09/06/2017   History of gout 09/06/2017   Diabetes mellitus type 2 in obese (HCC) 05/13/2015   Hypothyroid 07/25/2012   Gouty arthritis 05/02/2012   Osteoarthritis 05/02/2012   Hypertension associated with diabetes (HCC) 05/02/2012   Hyperlipidemia 05/02/2012    Past Medical History:  Diagnosis Date   Anemia    Arthritis    Diabetes mellitus without complication (HCC)    Gout    High cholesterol    Hypertension    Osteoporosis     Past Surgical History:  Procedure Laterality Date   ABDOMINAL HYSTERECTOMY     TUBAL LIGATION      Social History   Socioeconomic History   Marital status: Married    Spouse name: Not on file   Number of children: Not on file   Years of education: Not on file   Highest education level: Not on file  Occupational History   Not on file  Tobacco Use   Smoking status: Never   Smokeless tobacco: Never  Vaping Use   Vaping Use: Never used  Substance and Sexual Activity   Alcohol use: No   Drug use: No   Sexual activity: Yes  Other Topics Concern   Not on file  Social History Narrative   Not on file   Social Determinants of Health   Financial Resource Strain: Not on file  Food Insecurity: Not on file  Transportation Needs: Not on file  Physical Activity: Not on file  Stress: Not on file  Social Connections: Not on file  Intimate Partner Violence: Not on file    Family History  Problem Relation Age of Onset   Heart disease Mother    Stroke Mother    Hypertension Mother    Stroke Father    Colon cancer Sister    SIDS Son    Stroke Paternal Grandmother    Cancer Paternal Grandmother    ALS Brother    Stroke Maternal Grandmother    Cancer Paternal Grandfather     Testicular cancer Son    Down syndrome Son    Heart disease Brother      Review of Systems  Constitutional: Negative.  Negative for chills and fever.  HENT: Negative.  Negative for congestion and sore throat.   Eyes: Negative.   Respiratory: Negative.  Negative for cough and shortness of breath.   Cardiovascular:  Negative for chest pain and palpitations.  Gastrointestinal:  Negative for abdominal pain, diarrhea, nausea and vomiting.  Genitourinary: Negative.  Negative for dysuria.  Musculoskeletal:  Positive for joint pain.  Skin: Negative.  Negative for rash.  Neurological: Negative.  Negative for dizziness and headaches.  All other systems reviewed and are negative.  Today's Vitals   05/05/21 0812  BP: 140/82  Pulse: 89  Temp: 98.2 F (36.8 C)  TempSrc: Oral  SpO2: 96%  Weight: 174 lb (78.9 kg)  Height: 5\' 3"  (1.6 m)   Body mass index is 30.82 kg/m. Wt Readings from Last 3 Encounters:  05/05/21 174 lb (78.9 kg)  01/14/21 179 lb (81.2 kg)  10/28/20 177 lb (80.3 kg)    Physical Exam Vitals reviewed.  Constitutional:      Appearance: Normal appearance.  HENT:     Head: Normocephalic.  Eyes:     Extraocular Movements: Extraocular movements intact.     Conjunctiva/sclera: Conjunctivae normal.     Pupils: Pupils are equal, round, and reactive to light.  Cardiovascular:     Rate and Rhythm: Normal rate and regular rhythm.     Pulses: Normal pulses.     Heart sounds: Normal heart sounds.  Pulmonary:     Effort: Pulmonary effort is normal.     Breath sounds: Normal breath sounds.  Musculoskeletal:     Cervical back: Normal range of motion and neck supple. No tenderness.     Comments: No active sites of synovitis or arthritis. Bouchard and Heberden's nodes noted  Lymphadenopathy:     Cervical: No cervical adenopathy.  Skin:    General: Skin is warm and dry.  Neurological:     General: No focal deficit present.     Mental Status: She is alert and oriented to  person, place, and time.  Psychiatric:        Mood and Affect: Mood normal.        Behavior: Behavior normal.     ASSESSMENT & PLAN: Hypertension associated with diabetes (HCC) Well-controlled hypertension with normal blood pressure readings at home. Continue losartan 100 mg daily and metoprolol tartrate 25 mg twice a day. Well-controlled diabetes.  Continue metformin 500 mg twice a day. Diet and nutrition discussed. Follow-up in 6 months.  Hypothyroid Clinically euthyroid.  Continue Synthroid 75 mcg daily.  Osteoarthritis Stable and well-controlled.  History of gout No recent flareups.  Takes probenecid 3 times a day.  Intolerant to allopurinol.  Colchicine when needed.  Stable.  Hyperlipidemia Diet and nutrition discussed.  Continue rosuvastatin 10 mg daily. The 10-year ASCVD risk score (Arnett DK, et al., 2019) is: 33.2%   Values used to calculate the score:     Age: 77 years     Sex: Female     Is Non-Hispanic African American: No     Diabetic: Yes     Tobacco smoker: No     Systolic Blood Pressure: 130 mmHg     Is BP treated: Yes     HDL Cholesterol: 37 mg/dL     Total Cholesterol: 133 mg/dL Isabella Dixon was seen today for hypertension.  Diagnoses and all orders for this visit:  Obesity, diabetes, and hypertension syndrome (HCC) -     CBC with Differential/Platelet -     Comprehensive metabolic panel -     Hemoglobin A1c -     Lipid panel  Need for influenza vaccination -     Flu Vaccine QUAD High Dose(Fluad)  Hypertension associated with diabetes (HCC)  Dyslipidemia associated with type 2 diabetes mellitus (HCC) -     Lipid panel  Hypothyroidism, unspecified type -     TSH  Essential hypertension -     losartan (COZAAR) 100 MG tablet; Take 1 tablet (100 mg total) by mouth daily. -     metoprolol tartrate (LOPRESSOR)  25 MG tablet; Take 1 tablet (25 mg total) by mouth 2 (two) times daily.  Type 2 diabetes mellitus without complication, without long-term  current use of insulin (HCC) -     metFORMIN (GLUCOPHAGE) 500 MG tablet; Take 1 tablet (500 mg total) by mouth 2 (two) times daily with a meal.  Other specified hypothyroidism -     levothyroxine (SYNTHROID) 75 MCG tablet; Take 1 tablet (75 mcg total) by mouth daily.  Osteoarthritis of multiple joints, unspecified osteoarthritis type  History of gout  Hyperlipidemia, unspecified hyperlipidemia type -     rosuvastatin (CRESTOR) 10 MG tablet; Take 1 tablet (10 mg total) by mouth daily.  History of sciatica -     gabapentin (NEURONTIN) 100 MG capsule; Take 2 capsules (200 mg total) by mouth at bedtime.  Other orders -     probenecid (BENEMID) 500 MG tablet; Take 1 tablet (500 mg total) by mouth 3 (three) times daily.  Patient Instructions  Health Maintenance After Age 7 After age 71, you are at a higher risk for certain long-term diseases and infections as well as injuries from falls. Falls are a major cause of broken bones and head injuries in people who are older than age 43. Getting regular preventive care can help to keep you healthy and well. Preventive care includes getting regular testing and making lifestyle changes as recommended by your health care provider. Talk with your health care provider about: Which screenings and tests you should have. A screening is a test that checks for a disease when you have no symptoms. A diet and exercise plan that is right for you. What should I know about screenings and tests to prevent falls? Screening and testing are the best ways to find a health problem early. Early diagnosis and treatment give you the best chance of managing medical conditions that are common after age 38. Certain conditions and lifestyle choices may make you more likely to have a fall. Your health care provider may recommend: Regular vision checks. Poor vision and conditions such as cataracts can make you more likely to have a fall. If you wear glasses, make sure to get your  prescription updated if your vision changes. Medicine review. Work with your health care provider to regularly review all of the medicines you are taking, including over-the-counter medicines. Ask your health care provider about any side effects that may make you more likely to have a fall. Tell your health care provider if any medicines that you take make you feel dizzy or sleepy. Osteoporosis screening. Osteoporosis is a condition that causes the bones to get weaker. This can make the bones weak and cause them to break more easily. Blood pressure screening. Blood pressure changes and medicines to control blood pressure can make you feel dizzy. Strength and balance checks. Your health care provider may recommend certain tests to check your strength and balance while standing, walking, or changing positions. Foot health exam. Foot pain and numbness, as well as not wearing proper footwear, can make you more likely to have a fall. Depression screening. You may be more likely to have a fall if you have a fear of falling, feel emotionally low, or feel unable to do activities that you used to do. Alcohol use screening. Using too much alcohol can affect your balance and may make you more likely to have a fall. What actions can I take to lower my risk of falls? General instructions Talk with your health care provider about your risks  for falling. Tell your health care provider if: You fall. Be sure to tell your health care provider about all falls, even ones that seem minor. You feel dizzy, sleepy, or off-balance. Take over-the-counter and prescription medicines only as told by your health care provider. These include any supplements. Eat a healthy diet and maintain a healthy weight. A healthy diet includes low-fat dairy products, low-fat (lean) meats, and fiber from whole grains, beans, and lots of fruits and vegetables. Home safety Remove any tripping hazards, such as rugs, cords, and clutter. Install  safety equipment such as grab bars in bathrooms and safety rails on stairs. Keep rooms and walkways well-lit. Activity  Follow a regular exercise program to stay fit. This will help you maintain your balance. Ask your health care provider what types of exercise are appropriate for you. If you need a cane or walker, use it as recommended by your health care provider. Wear supportive shoes that have nonskid soles. Lifestyle Do not drink alcohol if your health care provider tells you not to drink. If you drink alcohol, limit how much you have: 0-1 drink a day for women. 0-2 drinks a day for men. Be aware of how much alcohol is in your drink. In the [place] one drink equals one typical bottle of beer (12 oz), one-half glass of wine (5 oz), or one shot of hard liquor (1 oz). Do not use any products that contain nicotine or tobacco, such as cigarettes and e-cigarettes. If you need help quitting, ask your health care provider. Summary Having a healthy lifestyle and getting preventive care can help to protect your health and wellness after age 14. Screening and testing are the best way to find a health problem early and help you avoid having a fall. Early diagnosis and treatment give you the best chance for managing medical conditions that are more common for people who are older than age 22. Falls are a major cause of broken bones and head injuries in people who are older than age 24. Take precautions to prevent a fall at home. Work with your health care provider to learn what changes you can make to improve your health and wellness and to prevent falls. This information is not intended to replace advice given to you by your health care provider. Make sure you discuss any questions you have with your health care provider. Document Revised: 10/17/2020 Document Reviewed: 07/25/2020 Elsevier Patient Education  2022 Elsevier Inc.    Edwina Barth, MD [facility]

## 2021-05-05 NOTE — Assessment & Plan Note (Signed)
Stable and well controlled. 

## 2021-05-05 NOTE — Assessment & Plan Note (Signed)
Diet and nutrition discussed.  Continue rosuvastatin 10 mg daily. The 10-year ASCVD risk score (Arnett DK, et al., 2019) is: 33.2%   Values used to calculate the score:     Age: 75 years     Sex: Female     Is Non-Hispanic African American: No     Diabetic: Yes     Tobacco smoker: No     Systolic Blood Pressure: 130 mmHg     Is BP treated: Yes     HDL Cholesterol: 37 mg/dL     Total Cholesterol: 133 mg/dL

## 2021-05-05 NOTE — Patient Instructions (Signed)
Health Maintenance After Age 74 After age 74, you are at a higher risk for certain long-term diseases and infections as well as injuries from falls. Falls are a major cause of broken bones and head injuries in people who are older than age 74. Getting regular preventive care can help to keep you healthy and well. Preventive care includes getting regular testing and making lifestyle changes as recommended by your health care provider. Talk with your health care provider about: Which screenings and tests you should have. A screening is a test that checks for a disease when you have no symptoms. A diet and exercise plan that is right for you. What should I know about screenings and tests to prevent falls? Screening and testing are the best ways to find a health problem early. Early diagnosis and treatment give you the best chance of managing medical conditions that are common after age 74. Certain conditions and lifestyle choices may make you more likely to have a fall. Your health care provider may recommend: Regular vision checks. Poor vision and conditions such as cataracts can make you more likely to have a fall. If you wear glasses, make sure to get your prescription updated if your vision changes. Medicine review. Work with your health care provider to regularly review all of the medicines you are taking, including over-the-counter medicines. Ask your health care provider about any side effects that may make you more likely to have a fall. Tell your health care provider if any medicines that you take make you feel dizzy or sleepy. Osteoporosis screening. Osteoporosis is a condition that causes the bones to get weaker. This can make the bones weak and cause them to break more easily. Blood pressure screening. Blood pressure changes and medicines to control blood pressure can make you feel dizzy. Strength and balance checks. Your health care provider may recommend certain tests to check your strength and  balance while standing, walking, or changing positions. Foot health exam. Foot pain and numbness, as well as not wearing proper footwear, can make you more likely to have a fall. Depression screening. You may be more likely to have a fall if you have a fear of falling, feel emotionally low, or feel unable to do activities that you used to do. Alcohol use screening. Using too much alcohol can affect your balance and may make you more likely to have a fall. What actions can I take to lower my risk of falls? General instructions Talk with your health care provider about your risks for falling. Tell your health care provider if: You fall. Be sure to tell your health care provider about all falls, even ones that seem minor. You feel dizzy, sleepy, or off-balance. Take over-the-counter and prescription medicines only as told by your health care provider. These include any supplements. Eat a healthy diet and maintain a healthy weight. A healthy diet includes low-fat dairy products, low-fat (lean) meats, and fiber from whole grains, beans, and lots of fruits and vegetables. Home safety Remove any tripping hazards, such as rugs, cords, and clutter. Install safety equipment such as grab bars in bathrooms and safety rails on stairs. Keep rooms and walkways well-lit. Activity  Follow a regular exercise program to stay fit. This will help you maintain your balance. Ask your health care provider what types of exercise are appropriate for you. If you need a cane or walker, use it as recommended by your health care provider. Wear supportive shoes that have nonskid soles. Lifestyle Do not   drink alcohol if your health care provider tells you not to drink. If you drink alcohol, limit how much you have: 0-1 drink a day for women. 0-2 drinks a day for men. Be aware of how much alcohol is in your drink. In the [place] one drink equals one typical bottle of beer (12 oz), one-half glass of wine (5 oz), or one shot of  hard liquor (1 oz). Do not use any products that contain nicotine or tobacco, such as cigarettes and e-cigarettes. If you need help quitting, ask your health care provider. Summary Having a healthy lifestyle and getting preventive care can help to protect your health and wellness after age 74. Screening and testing are the best way to find a health problem early and help you avoid having a fall. Early diagnosis and treatment give you the best chance for managing medical conditions that are more common for people who are older than age 74. Falls are a major cause of broken bones and head injuries in people who are older than age 74. Take precautions to prevent a fall at home. Work with your health care provider to learn what changes you can make to improve your health and wellness and to prevent falls. This information is not intended to replace advice given to you by your health care provider. Make sure you discuss any questions you have with your health care provider. Document Revised: 10/17/2020 Document Reviewed: 07/25/2020 Elsevier Patient Education  2022 Elsevier Inc.  

## 2021-05-05 NOTE — Assessment & Plan Note (Addendum)
Clinically euthyroid.  Continue Synthroid 75 mcg daily. 

## 2021-05-05 NOTE — Assessment & Plan Note (Signed)
Well-controlled hypertension with normal blood pressure readings at home. Continue losartan 100 mg daily and metoprolol tartrate 25 mg twice a day. Well-controlled diabetes.  Continue metformin 500 mg twice a day. Diet and nutrition discussed. Follow-up in 6 months.

## 2021-06-22 ENCOUNTER — Telehealth: Payer: Self-pay | Admitting: Emergency Medicine

## 2021-06-22 NOTE — Telephone Encounter (Signed)
Patient is requesting lab results from [facility] 09/13 be mailed to her to mailing address on file  Please let patient know when it has been mailed [phone]

## 2021-06-22 NOTE — Telephone Encounter (Signed)
Mailed pt lab results 06/22/21, called pt to make her aware.

## 2021-06-23 LAB — HM DIABETES EYE EXAM

## 2021-07-08 ENCOUNTER — Other Ambulatory Visit: Payer: Self-pay

## 2021-07-08 ENCOUNTER — Inpatient Hospital Stay (HOSPITAL_COMMUNITY)
Admission: EM | Admit: 2021-07-08 | Discharge: 2021-07-20 | DRG: 558 | Disposition: A | Discharge status: Skilled Nursing Facility | Payer: Medicare Other | Attending: Internal Medicine | Admitting: Internal Medicine

## 2021-07-08 ENCOUNTER — Encounter (HOSPITAL_COMMUNITY): Payer: Self-pay | Admitting: Internal Medicine

## 2021-07-08 ENCOUNTER — Emergency Department (HOSPITAL_COMMUNITY): Payer: Medicare Other

## 2021-07-08 DIAGNOSIS — I152 Hypertension secondary to endocrine disorders: Secondary | ICD-10-CM | POA: Diagnosis present

## 2021-07-08 DIAGNOSIS — E7800 Pure hypercholesterolemia, unspecified: Secondary | ICD-10-CM | POA: Diagnosis present

## 2021-07-08 DIAGNOSIS — Z751 Person awaiting admission to adequate facility elsewhere: Secondary | ICD-10-CM | POA: Diagnosis not present

## 2021-07-08 DIAGNOSIS — M19041 Primary osteoarthritis, right hand: Secondary | ICD-10-CM | POA: Diagnosis present

## 2021-07-08 DIAGNOSIS — R230 Cyanosis: Secondary | ICD-10-CM | POA: Diagnosis not present

## 2021-07-08 DIAGNOSIS — L0390 Cellulitis, unspecified: Secondary | ICD-10-CM | POA: Diagnosis not present

## 2021-07-08 DIAGNOSIS — Z823 Family history of stroke: Secondary | ICD-10-CM | POA: Diagnosis not present

## 2021-07-08 DIAGNOSIS — M79604 Pain in right leg: Secondary | ICD-10-CM | POA: Diagnosis not present

## 2021-07-08 DIAGNOSIS — M79641 Pain in right hand: Secondary | ICD-10-CM

## 2021-07-08 DIAGNOSIS — M109 Gout, unspecified: Secondary | ICD-10-CM | POA: Diagnosis present

## 2021-07-08 DIAGNOSIS — E1140 Type 2 diabetes mellitus with diabetic neuropathy, unspecified: Secondary | ICD-10-CM | POA: Diagnosis present

## 2021-07-08 DIAGNOSIS — Z7984 Long term (current) use of oral hypoglycemic drugs: Secondary | ICD-10-CM

## 2021-07-08 DIAGNOSIS — E876 Hypokalemia: Secondary | ICD-10-CM | POA: Diagnosis present

## 2021-07-08 DIAGNOSIS — E669 Obesity, unspecified: Secondary | ICD-10-CM | POA: Diagnosis present

## 2021-07-08 DIAGNOSIS — Z79899 Other long term (current) drug therapy: Secondary | ICD-10-CM | POA: Diagnosis not present

## 2021-07-08 DIAGNOSIS — M810 Age-related osteoporosis without current pathological fracture: Secondary | ICD-10-CM | POA: Diagnosis present

## 2021-07-08 DIAGNOSIS — M2550 Pain in unspecified joint: Secondary | ICD-10-CM

## 2021-07-08 DIAGNOSIS — E1159 Type 2 diabetes mellitus with other circulatory complications: Secondary | ICD-10-CM | POA: Diagnosis present

## 2021-07-08 DIAGNOSIS — Z79890 Hormone replacement therapy (postmenopausal): Secondary | ICD-10-CM

## 2021-07-08 DIAGNOSIS — E039 Hypothyroidism, unspecified: Secondary | ICD-10-CM | POA: Diagnosis present

## 2021-07-08 DIAGNOSIS — L03113 Cellulitis of right upper limb: Secondary | ICD-10-CM | POA: Diagnosis not present

## 2021-07-08 DIAGNOSIS — M65841 Other synovitis and tenosynovitis, right hand: Secondary | ICD-10-CM | POA: Diagnosis not present

## 2021-07-08 DIAGNOSIS — L03116 Cellulitis of left lower limb: Secondary | ICD-10-CM

## 2021-07-08 DIAGNOSIS — E118 Type 2 diabetes mellitus with unspecified complications: Secondary | ICD-10-CM | POA: Diagnosis not present

## 2021-07-08 DIAGNOSIS — R609 Edema, unspecified: Secondary | ICD-10-CM | POA: Diagnosis not present

## 2021-07-08 DIAGNOSIS — M65872 Other synovitis and tenosynovitis, left ankle and foot: Secondary | ICD-10-CM | POA: Diagnosis present

## 2021-07-08 DIAGNOSIS — Z8249 Family history of ischemic heart disease and other diseases of the circulatory system: Secondary | ICD-10-CM

## 2021-07-08 DIAGNOSIS — Z800 Family history of malignant neoplasm of digestive organs: Secondary | ICD-10-CM

## 2021-07-08 DIAGNOSIS — L538 Other specified erythematous conditions: Secondary | ICD-10-CM | POA: Diagnosis not present

## 2021-07-08 DIAGNOSIS — E1169 Type 2 diabetes mellitus with other specified complication: Secondary | ICD-10-CM | POA: Diagnosis not present

## 2021-07-08 DIAGNOSIS — Z8669 Personal history of other diseases of the nervous system and sense organs: Secondary | ICD-10-CM

## 2021-07-08 DIAGNOSIS — Z20822 Contact with and (suspected) exposure to covid-19: Secondary | ICD-10-CM | POA: Diagnosis present

## 2021-07-08 DIAGNOSIS — E785 Hyperlipidemia, unspecified: Secondary | ICD-10-CM | POA: Diagnosis present

## 2021-07-08 DIAGNOSIS — Z6829 Body mass index (BMI) 29.0-29.9, adult: Secondary | ICD-10-CM | POA: Diagnosis not present

## 2021-07-08 DIAGNOSIS — M79642 Pain in left hand: Secondary | ICD-10-CM | POA: Diagnosis not present

## 2021-07-08 DIAGNOSIS — E871 Hypo-osmolality and hyponatremia: Secondary | ICD-10-CM | POA: Diagnosis present

## 2021-07-08 DIAGNOSIS — I10 Essential (primary) hypertension: Secondary | ICD-10-CM | POA: Diagnosis not present

## 2021-07-08 DIAGNOSIS — Z8279 Family history of other congenital malformations, deformations and chromosomal abnormalities: Secondary | ICD-10-CM | POA: Diagnosis not present

## 2021-07-08 DIAGNOSIS — M19072 Primary osteoarthritis, left ankle and foot: Secondary | ICD-10-CM | POA: Diagnosis present

## 2021-07-08 DIAGNOSIS — M79605 Pain in left leg: Secondary | ICD-10-CM | POA: Diagnosis not present

## 2021-07-08 DIAGNOSIS — F0390 Unspecified dementia without behavioral disturbance: Secondary | ICD-10-CM | POA: Diagnosis present

## 2021-07-08 LAB — CBC WITH DIFFERENTIAL/PLATELET
Abs Immature Granulocytes: 0.21 10*3/uL — ABNORMAL HIGH (ref 0.00–0.07)
Basophils Absolute: 0.1 10*3/uL (ref 0.0–0.1)
Basophils Relative: 0 %
Eosinophils Absolute: 0.1 10*3/uL (ref 0.0–0.5)
Eosinophils Relative: 1 %
HCT: 35.2 % — ABNORMAL LOW (ref 36.0–46.0)
Hemoglobin: 11.8 g/dL — ABNORMAL LOW (ref 12.0–15.0)
Immature Granulocytes: 2 %
Lymphocytes Relative: 18 %
Lymphs Abs: 2.3 10*3/uL (ref 0.7–4.0)
MCH: 30.3 pg (ref 26.0–34.0)
MCHC: 33.5 g/dL (ref 30.0–36.0)
MCV: 90.5 fL (ref 80.0–100.0)
Monocytes Absolute: 0.8 10*3/uL (ref 0.1–1.0)
Monocytes Relative: 7 %
Neutro Abs: 9.1 10*3/uL — ABNORMAL HIGH (ref 1.7–7.7)
Neutrophils Relative %: 72 %
Platelets: 405 10*3/uL — ABNORMAL HIGH (ref 150–400)
RBC: 3.89 MIL/uL (ref 3.87–5.11)
RDW: 13.7 % (ref 11.5–15.5)
WBC: 12.6 10*3/uL — ABNORMAL HIGH (ref 4.0–10.5)
nRBC: 0 % (ref 0.0–0.2)

## 2021-07-08 LAB — BASIC METABOLIC PANEL
Anion gap: 15 (ref 5–15)
BUN: 46 mg/dL — ABNORMAL HIGH (ref 8–23)
CO2: 26 mmol/L (ref 22–32)
Calcium: 9.2 mg/dL (ref 8.9–10.3)
Chloride: 88 mmol/L — ABNORMAL LOW (ref 98–111)
Creatinine, Ser: 0.99 mg/dL (ref 0.44–1.00)
GFR, Estimated: 60 mL/min — ABNORMAL LOW (ref >60)
Glucose, Bld: 119 mg/dL — ABNORMAL HIGH (ref 70–99)
Potassium: 3.4 mmol/L — ABNORMAL LOW (ref 3.5–5.1)
Sodium: 129 mmol/L — ABNORMAL LOW (ref 135–145)

## 2021-07-08 LAB — RESP PANEL BY RT-PCR (FLU A&B, COVID) ARPGX2
Influenza A by PCR: NEGATIVE
Influenza B by PCR: NEGATIVE
SARS Coronavirus 2 by RT PCR: NEGATIVE

## 2021-07-08 LAB — LACTIC ACID, PLASMA
Lactic Acid, Venous: 1.3 mmol/L (ref 0.5–1.9)
Lactic Acid, Venous: 1.8 mmol/L (ref 0.5–1.9)

## 2021-07-08 LAB — SEDIMENTATION RATE: Sed Rate: >140 mm/hr — ABNORMAL HIGH (ref 0–22)

## 2021-07-08 LAB — C-REACTIVE PROTEIN: CRP: 19 mg/dL — ABNORMAL HIGH (ref <1.0)

## 2021-07-08 LAB — HEMOGLOBIN A1C
Hgb A1c MFr Bld: 5.6 % (ref 4.8–5.6)
Mean Plasma Glucose: 114.02 mg/dL

## 2021-07-08 MED ORDER — INSULIN ASPART 100 UNIT/ML IJ SOLN: 0.0000 [IU] | Freq: Three times a day (TID) | INTRAMUSCULAR | Status: DC

## 2021-07-08 MED ORDER — IBUPROFEN 600 MG PO TABS
600.0000 mg | ORAL_TABLET | Freq: Four times a day (QID) | ORAL | Status: DC | PRN
  Administered 2021-07-08: 600 mg via ORAL
  Filled 2021-07-08: qty 1

## 2021-07-08 MED ORDER — METOPROLOL TARTRATE 25 MG PO TABS
25.0000 mg | ORAL_TABLET | Freq: Two times a day (BID) | ORAL | Status: DC
  Administered 2021-07-08 – 2021-07-20 (×24): 25 mg via ORAL
  Filled 2021-07-08 (×7): qty 1
  Filled 2021-07-08: qty 2
  Filled 2021-07-08 (×21): qty 1

## 2021-07-08 MED ORDER — GABAPENTIN 100 MG PO CAPS
200.0000 mg | ORAL_CAPSULE | Freq: Every day | ORAL | Status: DC
  Administered 2021-07-08: 200 mg via ORAL
  Filled 2021-07-08 (×8): qty 2

## 2021-07-08 MED ORDER — ACETAMINOPHEN 325 MG PO TABS
650.0000 mg | ORAL_TABLET | Freq: Four times a day (QID) | ORAL | Status: DC | PRN
  Administered 2021-07-09 – 2021-07-11 (×2): 650 mg via ORAL
  Filled 2021-07-08 (×2): qty 2

## 2021-07-08 MED ORDER — METFORMIN HCL 500 MG PO TABS
500.0000 mg | ORAL_TABLET | Freq: Two times a day (BID) | ORAL | Status: DC
  Administered 2021-07-09 – 2021-07-20 (×22): 500 mg via ORAL
  Filled 2021-07-08 (×24): qty 1

## 2021-07-08 MED ORDER — SODIUM CHLORIDE 0.9 % IV SOLN
1.0000 g | Freq: Once | INTRAVENOUS | Status: AC
  Administered 2021-07-08: 1 g via INTRAVENOUS
  Filled 2021-07-08: qty 10

## 2021-07-08 MED ORDER — ONDANSETRON HCL 4 MG/2ML IJ SOLN
4.0000 mg | Freq: Once | INTRAMUSCULAR | Status: AC
  Administered 2021-07-08: 4 mg via INTRAVENOUS
  Filled 2021-07-08: qty 2

## 2021-07-08 MED ORDER — IBUPROFEN 400 MG PO TABS
600.0000 mg | ORAL_TABLET | Freq: Once | ORAL | Status: AC
  Administered 2021-07-08: 600 mg via ORAL
  Filled 2021-07-08: qty 1

## 2021-07-08 MED ORDER — MORPHINE SULFATE (PF) 4 MG/ML IV SOLN
4.0000 mg | Freq: Once | INTRAVENOUS | Status: AC
  Administered 2021-07-08: 4 mg via INTRAVENOUS
  Filled 2021-07-08: qty 1

## 2021-07-08 MED ORDER — POTASSIUM CHLORIDE CRYS ER 20 MEQ PO TBCR
40.0000 meq | EXTENDED_RELEASE_TABLET | ORAL | Status: AC
  Administered 2021-07-08 (×2): 40 meq via ORAL
  Filled 2021-07-08 (×2): qty 2

## 2021-07-08 MED ORDER — COLCHICINE 0.6 MG PO TABS
0.6000 mg | ORAL_TABLET | Freq: Every day | ORAL | Status: DC
  Administered 2021-07-11 – 2021-07-19 (×9): 0.6 mg via ORAL
  Filled 2021-07-08 (×12): qty 1

## 2021-07-08 MED ORDER — DIPHENHYDRAMINE HCL 25 MG PO CAPS
25.0000 mg | ORAL_CAPSULE | Freq: Four times a day (QID) | ORAL | Status: DC
  Administered 2021-07-08 – 2021-07-20 (×46): 25 mg via ORAL
  Filled 2021-07-08 (×52): qty 1

## 2021-07-08 MED ORDER — ROSUVASTATIN CALCIUM 5 MG PO TABS
10.0000 mg | ORAL_TABLET | Freq: Every day | ORAL | Status: DC
  Administered 2021-07-08 – 2021-07-20 (×13): 10 mg via ORAL
  Filled 2021-07-08 (×13): qty 2

## 2021-07-08 MED ORDER — LOSARTAN POTASSIUM 50 MG PO TABS
100.0000 mg | ORAL_TABLET | Freq: Every day | ORAL | Status: DC
  Administered 2021-07-08 – 2021-07-20 (×13): 100 mg via ORAL
  Filled 2021-07-08 (×13): qty 2

## 2021-07-08 MED ORDER — LORAZEPAM 0.5 MG PO TABS: 0.5000 mg | ORAL_TABLET | Freq: Four times a day (QID) | ORAL | Status: DC | PRN

## 2021-07-08 MED ORDER — ENOXAPARIN SODIUM 40 MG/0.4ML IJ SOSY
40.0000 mg | PREFILLED_SYRINGE | INTRAMUSCULAR | Status: DC
  Administered 2021-07-08 – 2021-07-19 (×12): 40 mg via SUBCUTANEOUS
  Filled 2021-07-08 (×12): qty 0.4

## 2021-07-08 MED ORDER — SODIUM CHLORIDE 0.9 % IV SOLN: 2.0000 g | INTRAVENOUS | Status: DC

## 2021-07-08 MED ORDER — ACETAMINOPHEN 650 MG RE SUPP
650.0000 mg | Freq: Four times a day (QID) | RECTAL | Status: DC | PRN
  Filled 2021-07-08: qty 1

## 2021-07-08 MED ORDER — LEVOTHYROXINE SODIUM 75 MCG PO TABS
75.0000 ug | ORAL_TABLET | Freq: Every day | ORAL | Status: DC
  Administered 2021-07-09 – 2021-07-20 (×12): 75 ug via ORAL
  Filled 2021-07-08 (×12): qty 1

## 2021-07-08 NOTE — H&P (Addendum)
History and Physical    Isabella Dixon PZW:[id number] DOB: 1946-03-27 DOA: 07/08/2021  PCP: Georgina Quint, MD (Confirm with patient/family/NH records and if not entered, this has to be entered at [facility] point of entry) Patient coming from: Home  I have personally briefly reviewed patient's old medical records in [facility]  Chief Complaint: Right hand pain and left foot rash and pain  HPI: Isabella Dixon is a 75 y.o. female with medical history significant of gout, HTN, IIDM, HLD, DM neuropathy presented with new onset of right wrist and hand.  Symptoms started on Sunday, at the same time she developed rash and swelling and pain on left foot/ankle and right hand. "  So painful, can barely move right hand". She described the pain on the left foot as burning. No fever or chills.  She had COVID booster shot about 2 weeks ago.  Patient has been following with rheumatology for her gout, she has been on probenecid, she has no flareup this year.  And in the past flareups, she almost always got big toe involved, but not this time. The pain on the left foot has significantly affected her ambulation and she could not use her right hand for last 3 days for the pain.  ED Course: PERRL, WBC 12.6, x-ray of right hand showed dorsal soft tissue swelling, x-ray of the left foot showed dorsal soft tissue swelling.  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  Review of Systems: As per HPI otherwise 14 point review of systems negative.    Past Medical History:  Diagnosis Date   Anemia    Arthritis    Diabetes mellitus without complication (HCC)    Gout    High cholesterol    Hypertension    Osteoporosis     Past Surgical History:  Procedure Laterality Date   ABDOMINAL HYSTERECTOMY     TUBAL LIGATION       reports that she has never smoked. She has never used smokeless tobacco. She reports that she does not drink alcohol and does not use drugs.  No Known  Allergies  Family History  Problem Relation Age of Onset   Heart disease Mother    Stroke Mother    Hypertension Mother    Stroke Father    Colon cancer Sister    SIDS Son    Stroke Paternal Grandmother    Cancer Paternal Grandmother    ALS Brother    Stroke Maternal Grandmother    Cancer Paternal Grandfather    Testicular cancer Son    Down syndrome Son    Heart disease Brother      Prior to Admission medications   Medication Sig Start Date End Date Taking? Authorizing Provider  chlorpheniramine (CHLOR-TRIMETON) 4 MG tablet Take 4 mg by mouth 2 (two) times daily as needed for allergies.    [provider]  Cholecalciferol (VITAMIN D-3 PO) Take 2,000 Units by mouth daily.    [provider]  colchicine 0.6 MG tablet Take 2 tablets (1.2 mg total) by mouth daily as needed. 01/14/21   Pollyann Savoy, MD  gabapentin (NEURONTIN) 100 MG capsule Take 2 capsules (200 mg total) by mouth at bedtime. 05/05/21 08/03/21  Georgina Quint, MD  IBUPROFEN PO Take by mouth as needed.    [provider]  levothyroxine (SYNTHROID) 75 MCG tablet Take 1 tablet (75 mcg total) by mouth daily. 05/05/21 08/03/21  Georgina Quint, MD  losartan (COZAAR) 100 MG tablet  Take 1 tablet (100 mg total) by mouth daily. 05/05/21   Georgina Quint, MD  metFORMIN (GLUCOPHAGE) 500 MG tablet Take 1 tablet (500 mg total) by mouth 2 (two) times daily with a meal. 05/05/21 08/03/21  Sagardia, Eilleen Kempf, MD  metoprolol tartrate (LOPRESSOR) 25 MG tablet Take 1 tablet (25 mg total) by mouth 2 (two) times daily. 05/05/21 08/03/21  Georgina Quint, MD  Multiple Vitamin (MULTIVITAMIN) tablet Take 1 tablet by mouth daily.    [provider]  probenecid (BENEMID) 500 MG tablet Take 1 tablet (500 mg total) by mouth 3 (three) times daily. 05/05/21 08/03/21  Georgina Quint, MD  rosuvastatin (CRESTOR) 10 MG tablet Take 1 tablet (10 mg total) by mouth daily. 05/05/21    Georgina Quint, MD  vitamin C (ASCORBIC ACID) 250 MG tablet Take 250 mg by mouth daily.    [provider]    Physical Exam: Vitals:   07/08/21 1431  BP: 135/70  Pulse: (!) 111  Resp: 14  Temp: 98.2 F (36.8 C)  TempSrc: Oral  SpO2: 99%    Constitutional: NAD, calm, comfortable Vitals:   07/08/21 1431  BP: 135/70  Pulse: (!) 111  Resp: 14  Temp: 98.2 F (36.8 C)  TempSrc: Oral  SpO2: 99%   Eyes: PERRL, lids and conjunctivae normal ENMT: Mucous membranes are moist. Posterior pharynx clear of any exudate or lesions.Normal dentition.  Neck: normal, supple, no masses, no thyromegaly Respiratory: clear to auscultation bilaterally, no wheezing, no crackles. Normal respiratory effort. No accessory muscle use.  Cardiovascular: Regular rate and rhythm, no murmurs / rubs / gallops. No extremity edema. 2+ pedal pulses. No carotid bruits.  Abdomen: no tenderness, no masses palpated. No hepatosplenomegaly. Bowel sounds positive.  Musculoskeletal: Swelling of right hand and wrist, unable to make fist, capillary refill brisk on all fingernails.  Swelling and rash of left foot as shown in the picture. Skin: no rashes, lesions, ulcers. No induration Neurologic: CN 2-12 grossly intact. Sensation intact, DTR normal. Strength 5/5 in all 4.  Psychiatric: Normal judgment and insight. Alert and oriented x 3. Normal mood.           Labs on Admission: I have personally reviewed following labs and imaging studies  CBC: Recent Labs  Lab 07/08/21 1439  WBC 12.6*  NEUTROABS 9.1*  HGB 11.8*  HCT 35.2*  MCV 90.5  PLT 405*   Basic Metabolic Panel: Recent Labs  Lab 07/08/21 1439  NA 129*  K 3.4*  CL 88*  CO2 26  GLUCOSE 119*  BUN 46*  CREATININE 0.99  CALCIUM 9.2   GFR: CrCl cannot be calculated (Unknown ideal weight.). Liver Function Tests: No results for input(s): AST, ALT, ALKPHOS, BILITOT, PROT, ALBUMIN in the last 168 hours. No results for input(s):  LIPASE, AMYLASE in the last 168 hours. No results for input(s): AMMONIA in the last 168 hours. Coagulation Profile: No results for input(s): INR, PROTIME in the last 168 hours. Cardiac Enzymes: No results for input(s): CKTOTAL, CKMB, CKMBINDEX, TROPONINI in the last 168 hours. BNP (last 3 results) No results for input(s): PROBNP in the last 8760 hours. HbA1C: No results for input(s): HGBA1C in the last 72 hours. CBG: No results for input(s): GLUCAP in the last 168 hours. Lipid Profile: No results for input(s): CHOL, HDL, LDLCALC, TRIG, CHOLHDL, LDLDIRECT in the last 72 hours. Thyroid Function Tests: No results for input(s): TSH, T4TOTAL, FREET4, T3FREE, THYROIDAB in the last 72 hours. Anemia Panel: No results for  input(s): VITAMINB12, FOLATE, FERRITIN, TIBC, IRON, RETICCTPCT in the last 72 hours. Urine analysis:    Component Value Date/Time   BILIRUBINUR neg 05/08/2013 1634   PROTEINUR neg 05/08/2013 1634   UROBILINOGEN 1.0 05/08/2013 1634   NITRITE neg 05/08/2013 1634   LEUKOCYTESUR Negative 05/08/2013 1634    Radiological Exams on Admission: DG Wrist Complete Right  Result Date: 07/08/2021 CLINICAL DATA:  Pain EXAM: RIGHT WRIST - COMPLETE 3+ VIEW COMPARISON:  None. FINDINGS: No recent fracture or dislocation is seen. Degenerative changes are noted with bony spurs in the first carpometacarpal joint. Osteopenia is seen in bony structures. There is soft tissue swelling over the dorsum. IMPRESSION: No recent fracture or dislocation is seen. Degenerative changes are noted in first carpometacarpal joint. Electronically Signed   By: Ernie Avena M.D.   On: 07/08/2021 16:27   DG Hand Complete Right  Result Date: 07/08/2021 CLINICAL DATA:  Right wrist inflammation. EXAM: RIGHT HAND - COMPLETE 3+ VIEW COMPARISON:  Jan 14, 2021 FINDINGS: The second through fifth right fingers are limited in evaluation secondary to their flexed position. There is no evidence of fracture or  dislocation. Marked severity degenerative changes are seen involving the interphalangeal joint of the right thumb and visualized interphalangeal joints of the second through fifth right fingers. Moderate severity dorsal soft tissue swelling is seen. IMPRESSION: Marked severity degenerative changes and dorsal soft tissue swelling, without evidence of acute osseous abnormality. Electronically Signed   By: Aram Candela M.D.   On: 07/08/2021 16:24   DG Foot Complete Left  Result Date: 07/08/2021 CLINICAL DATA:  Pain and swelling EXAM: LEFT FOOT - COMPLETE 3+ VIEW COMPARISON:  01/14/2021 FINDINGS: No fracture or dislocation is seen. Osteopenia is seen in bony structures. No focal lytic lesions are seen. Degenerative changes are noted with bony spurs in the interphalangeal joints. There is soft tissue swelling over the dorsum. IMPRESSION: No recent fracture or dislocation is seen. Osteopenia. Degenerative changes are noted in the interphalangeal joints. Electronically Signed   By: Ernie Avena M.D.   On: 07/08/2021 16:24    EKG: Independently reviewed.  Sinus tachycardia.  Assessment/Plan Active Problems:   * No active hospital problems. *  (please populate well all problems here in Problem List. (For example, if patient is on BP meds at home and you resume or decide to hold them, it is a problem that needs to be her. Same for CAD, COPD, HLD and so on)  Left foot cellulitis -More than one third of surface area of left foot and affecting her ambulation. -Continue Ceftriaxone  -Send ASO  Right hand cellulitis, rule out deep tissue (tendonitis, synovitis, arthritis) -Rash and pain, appears to have underlying deep tissue infection given the severe pain and impairment of the mobility, will order MRI to further investigate. May need to consult hand surgery vs IR after the MRI resulted. -D/W on call ID physician Dr. Thedore Mins, will plans to see the patient in AM.  Hypokalemia -PO  replacement -Check Mg level.  HTN -Stable, continue home BP meds.  Gout -Will hold off probenecid until MRI can rule out any arthritis including acute gouty arthritis.  -PRN ibuprofen.  IIDM -Continue Metformin -Add sliding scale  HLD -Statin  DVT prophylaxis: Lovenox Code Status: Full code Family Communication: Husband at bedside Disposition Plan: Expect more than 2 midnight hospitals stay for IV antibiotics Consults called: ID Admission status: MedSurg admit   Emeline General MD [facility] Pager [phone]  07/08/2021, 5:18 PM

## 2021-07-08 NOTE — ED Triage Notes (Signed)
Pt reports near syncope six days ago and then went to bed like usual that day. Pt woke up the next morning with swelling, redness, and pain to BLE and RUE. Pain so bad she is non-ambulatory.

## 2021-07-08 NOTE — Progress Notes (Deleted)
Office Visit Note  Patient: Isabella Dixon             Date of Birth: 1946/02/12           MRN: [id number]             PCP: Horald Pollen, MD Referring: Horald Pollen, * Visit Date: 07/22/2021 Occupation: @GUAROCC @  Subjective:  No chief complaint on file.   History of Present Illness: Isabella Dixon is a 75 y.o. female ***   Activities of Daily Living:  Patient reports morning stiffness for *** {minute/hour:19697}.   Patient {ACTIONS;DENIES/REPORTS:21021675::"Denies"} nocturnal pain.  Difficulty dressing/grooming: {ACTIONS;DENIES/REPORTS:21021675::"Denies"} Difficulty climbing stairs: {ACTIONS;DENIES/REPORTS:21021675::"Denies"} Difficulty getting out of chair: {ACTIONS;DENIES/REPORTS:21021675::"Denies"} Difficulty using hands for taps, buttons, cutlery, and/or writing: {ACTIONS;DENIES/REPORTS:21021675::"Denies"}  No Rheumatology ROS completed.   PMFS History:  Patient Active Problem List   Diagnosis Date Noted   Osteoporosis 05/05/2021   Ectopic pregnancy 05/05/2021   Primary osteoarthritis of both hands 06/21/2019   Primary osteoarthritis of both feet 06/21/2019   Primary osteoarthritis of both knees 06/21/2019   History of sciatica 09/06/2017   Obesity, diabetes, and hypertension syndrome ([place]) 09/06/2017   History of gout 09/06/2017   Diabetes mellitus type 2 in obese ([place]) 05/13/2015   Hypothyroid 07/25/2012   Gouty arthritis 05/02/2012   Osteoarthritis 05/02/2012   Hypertension associated with diabetes ([place]) 05/02/2012   Hyperlipidemia 05/02/2012    Past Medical History:  Diagnosis Date   Anemia    Arthritis    Diabetes mellitus without complication ([place])    Gout    High cholesterol    Hypertension    Osteoporosis     Family History  Problem Relation Age of Onset   Heart disease Mother    Stroke Mother    Hypertension Mother    Stroke Father    Colon cancer Sister    SIDS Son    Stroke Paternal Grandmother    Cancer  Paternal Grandmother    ALS Brother    Stroke Maternal Grandmother    Cancer Paternal Grandfather    Testicular cancer Son    Down syndrome Son    Heart disease Brother    Past Surgical History:  Procedure Laterality Date   ABDOMINAL HYSTERECTOMY     TUBAL LIGATION     Social History   Social History Narrative   Not on file   Immunization History  Administered Date(s) Administered   Fluad Quad(high Dose 65+) 05/02/2019, 04/30/2020, 05/05/2021   Influenza Split 05/02/2012   Influenza, High Dose Seasonal PF 06/15/2018   Influenza,inj,Quad PF,6+ Mos 05/08/2013, 04/30/2014, 05/13/2015, 05/17/2016, 09/06/2017   PFIZER(Purple Top)SARS-COV-2 Vaccination 09/17/2019, 10/08/2019, 06/05/2020   Pneumococcal Conjugate-13 05/13/2015   Pneumococcal Polysaccharide-23 11/01/2018   Tdap 03/14/2008     Objective: Vital Signs: There were no vitals taken for this visit.   Physical Exam   Musculoskeletal Exam: ***  CDAI Exam: CDAI Score: -- Patient Global: --; Provider Global: -- Swollen: --; Tender: -- Joint Exam 07/22/2021   No joint exam has been documented for this visit   There is currently no information documented on the homunculus. Go to the Rheumatology activity and complete the homunculus joint exam.  Investigation: No additional findings.  Imaging: No results found.  Recent Labs: Lab Results  Component Value Date   WBC 6.3 05/05/2021   HGB 13.1 05/05/2021   PLT 240.0 05/05/2021   NA 139 05/05/2021   K 4.0 05/05/2021   CL 101 05/05/2021   CO2 29 05/05/2021  GLUCOSE 95 05/05/2021   BUN 17 05/05/2021   CREATININE 0.78 05/05/2021   BILITOT 0.4 05/05/2021   ALKPHOS 75 05/05/2021   AST 18 05/05/2021   ALT 15 05/05/2021   PROT 8.3 05/05/2021   ALBUMIN 4.1 05/05/2021   CALCIUM 9.6 05/05/2021   GFRAA 86 07/02/2020    Speciality Comments: Allopurinol- diarrhea  Procedures:  No procedures performed Allergies: Patient has no known allergies.   Assessment /  Plan:     Visit Diagnoses: No diagnosis found.  Orders: No orders of the defined types were placed in this encounter.  No orders of the defined types were placed in this encounter.   Face-to-face time spent with patient was *** minutes. Greater than 50% of time was spent in counseling and coordination of care.  Follow-Up Instructions: No follow-ups on file.   Ellen Henri, CMA  Note - This record has been created using Animal nutritionist.  Chart creation errors have been sought, but may not always  have been located. Such creation errors do not reflect on  the standard of medical care.

## 2021-07-08 NOTE — ED Notes (Signed)
Per Dayna Ramus, 6N send pt up at [facility]

## 2021-07-08 NOTE — ED Provider Notes (Addendum)
Emergency Medicine Provider Triage Evaluation Note  Isabella Dixon , a 75 y.o. female  was evaluated in triage.  6 days ago the patient was doing yard work and felt as though she may faint.  Went to sit in her car and never lost consciousness.  The next day she had inflammation and erythema to her left foot and right wrist.  Also reported she is unable to move her right upper extremity.  No known allergies and does not believe she was bitten by anything during her yard work.  Review of Systems  Positive:  Negative: Fevers, chills, numbness or tingling.  Physical Exam  BP 135/70   Pulse (!) 111   Temp 98.2 F (36.8 C) (Oral)   Resp 14   SpO2 99%  Gen:   Awake, no distress   Resp:  Normal effort  MSK:   Moves extremities without difficulty  Other:  Erythema and inflammation to left foot and right hand.  Strong radial pulses.  Patient able to shrug her shoulders however is unable to elevate her arm without assistance from her left.  DP pulses in left foot with Doppler  Medical Decision Making  Medically screening exam initiated at 2:36 PM.  Appropriate orders placed.  Isabella Dixon was informed that the remainder of the evaluation will be completed by another provider, this initial triage assessment does not replace that evaluation, and the importance of remaining in the ED until their evaluation is complete.   Saddie Benders, PA-C 07/08/21 1452    Rozelle Logan, DO 07/08/21 [id number]

## 2021-07-08 NOTE — ED Provider Notes (Signed)
[facility] EMERGENCY DEPARTMENT Provider Note   CSN: [id number] Arrival date & time: 07/08/21  1422     History No chief complaint on file.   Isabella Dixon is a 75 y.o. female.  75 yo F with a chief complaints of right wrist pain and swelling of the left ankle pain and swelling.  This is been going on for a few days now.  Having some chills but no fevers.  Not really wanting to eat and drink as much.  She is confused for me but at her baseline per her family member.  Level 5 caveat dementia.       Past Medical History:  Diagnosis Date   Anemia    Arthritis    Diabetes mellitus without complication ([place])    Gout    High cholesterol    Hypertension    Osteoporosis     Patient Active Problem List   Diagnosis Date Noted   Cellulitis 07/08/2021   Osteoporosis 05/05/2021   Ectopic pregnancy 05/05/2021   Primary osteoarthritis of both hands 06/21/2019   Primary osteoarthritis of both feet 06/21/2019   Primary osteoarthritis of both knees 06/21/2019   History of sciatica 09/06/2017   Obesity, diabetes, and hypertension syndrome ([place]) 09/06/2017   History of gout 09/06/2017   Diabetes mellitus type 2 in obese ([place]) 05/13/2015   Hypothyroid 07/25/2012   Gouty arthritis 05/02/2012   Osteoarthritis 05/02/2012   Hypertension associated with diabetes ([place]) 05/02/2012   Hyperlipidemia 05/02/2012    Past Surgical History:  Procedure Laterality Date   ABDOMINAL HYSTERECTOMY     TUBAL LIGATION       OB History   No obstetric history on file.     Family History  Problem Relation Age of Onset   Heart disease Mother    Stroke Mother    Hypertension Mother    Stroke Father    Colon cancer Sister    SIDS Son    Stroke Paternal Grandmother    Cancer Paternal Grandmother    ALS Brother    Stroke Maternal Grandmother    Cancer Paternal Grandfather    Testicular cancer Son    Down syndrome Son    Heart disease Brother     Social History    Tobacco Use   Smoking status: Never   Smokeless tobacco: Never  Vaping Use   Vaping Use: Never used  Substance Use Topics   Alcohol use: No   Drug use: No    Home Medications Prior to Admission medications   Medication Sig Start Date End Date Taking? Authorizing Provider  chlorpheniramine (CHLOR-TRIMETON) 4 MG tablet Take 4 mg by mouth 2 (two) times daily as needed for allergies.   Yes [provider]  Cholecalciferol (VITAMIN D-3 PO) Take 2,000 Units by mouth daily.   Yes [provider]  colchicine 0.6 MG tablet Take 2 tablets (1.2 mg total) by mouth daily as needed. 01/14/21  Yes Deveshwar, Abel Presto, MD  gabapentin (NEURONTIN) 100 MG capsule Take 2 capsules (200 mg total) by mouth at bedtime. 05/05/21 08/03/21 Yes Sagardia, Ines [place], MD  levothyroxine (SYNTHROID) 75 MCG tablet Take 1 tablet (75 mcg total) by mouth daily. 05/05/21 08/03/21 Yes Sagardia, Ines [place], MD  losartan (COZAAR) 100 MG tablet Take 1 tablet (100 mg total) by mouth daily. 05/05/21  Yes Horald Pollen, MD  metFORMIN (GLUCOPHAGE) 500 MG tablet Take 1 tablet (500 mg total) by mouth 2 (two) times daily with a meal. 05/05/21 08/03/21  Yes Horald Pollen, MD  metoprolol tartrate (LOPRESSOR) 25 MG tablet Take 1 tablet (25 mg total) by mouth 2 (two) times daily. 05/05/21 08/03/21 Yes Sagardia, Ines [place], MD  Multiple Vitamin (MULTIVITAMIN) tablet Take 1 tablet by mouth daily.   Yes [provider]  probenecid (BENEMID) 500 MG tablet Take 1 tablet (500 mg total) by mouth 3 (three) times daily. 05/05/21 08/03/21 Yes Sagardia, Ines [place], MD  rosuvastatin (CRESTOR) 10 MG tablet Take 1 tablet (10 mg total) by mouth daily. 05/05/21  Yes Sagardia, Ines [place], MD  vitamin C (ASCORBIC ACID) 250 MG tablet Take 250 mg by mouth daily.   Yes [provider]    Allergies    Patient has no known allergies.  Review of Systems   Review of Systems  Unable to perform ROS: Dementia    Physical Exam Updated Vital Signs BP 135/70   Pulse (!) 111   Temp 98.2 F (36.8 C) (Oral)   Resp 14   SpO2 99%   Physical Exam Vitals and nursing note reviewed.  Constitutional:      General: She is not in acute distress.    Appearance: She is well-developed. She is not diaphoretic.  HENT:     Head: Normocephalic and atraumatic.  Eyes:     Pupils: Pupils are equal, round, and reactive to light.  Cardiovascular:     Rate and Rhythm: Normal rate and regular rhythm.     Heart sounds: No murmur heard.   No friction rub. No gallop.  Pulmonary:     Effort: Pulmonary effort is normal.     Breath sounds: No wheezing or rales.  Abdominal:     General: There is no distension.     Palpations: Abdomen is soft.     Tenderness: There is no abdominal tenderness.  Musculoskeletal:        General: Swelling and tenderness present.     Cervical back: Normal range of motion and neck supple.     Comments: Erythema and swelling to the left lower extremity from the mid shin down to the foot.  Pulse motor and sensation intact.  Some pain with range of motion of the ankle but worse pain with palpation of the soft tissues.  No obvious fluctuance or induration.  Patient also with similar exam to the right wrist but with some bruising.  Able to range the wrist without significant discomfort more pain again with palpation of the soft tissues.  Pulse motor and sensation intact distally.  Skin:    General: Skin is warm and dry.  Neurological:     Mental Status: She is alert and oriented to person, place, and time.  Psychiatric:        Behavior: Behavior normal.    ED Results / Procedures / Treatments   Labs (all labs ordered are listed, but only abnormal results are displayed) Labs Reviewed  CBC WITH DIFFERENTIAL/PLATELET - Abnormal; Notable for the following components:      Result Value   WBC 12.6 (*)    Hemoglobin 11.8 (*)    HCT 35.2 (*)    Platelets 405 (*)    Neutro Abs 9.1 (*)     Abs Immature Granulocytes 0.21 (*)    All other components within normal limits  BASIC METABOLIC PANEL - Abnormal; Notable for the following components:   Sodium 129 (*)    Potassium 3.4 (*)    Chloride 88 (*)    Glucose, Bld 119 (*)  BUN 46 (*)    GFR, Estimated 60 (*)    All other components within normal limits  CULTURE, BLOOD (ROUTINE X 2)  CULTURE, BLOOD (ROUTINE X 2)  RESP PANEL BY RT-PCR (FLU A&B, COVID) ARPGX2  LACTIC ACID, PLASMA  LACTIC ACID, PLASMA  URINALYSIS, ROUTINE W REFLEX MICROSCOPIC  SEDIMENTATION RATE  C-REACTIVE PROTEIN  ANTISTREPTOLYSIN O TITER  BASIC METABOLIC PANEL  CBC  HEMOGLOBIN A1C  MAGNESIUM    EKG EKG Interpretation  Date/Time:  Wednesday July 08 2021 14:42:04 EST Ventricular Rate:  115 PR Interval:  122 QRS Duration: 78 QT Interval:  348 QTC Calculation: 481 R Axis:   69 Text Interpretation: Sinus tachycardia Otherwise normal ECG No old tracing to compare Confirmed by Deno Etienne [phone]) on 07/08/2021 4:24:11 PM  Radiology DG Wrist Complete Right  Result Date: 07/08/2021 CLINICAL DATA:  Pain EXAM: RIGHT WRIST - COMPLETE 3+ VIEW COMPARISON:  None. FINDINGS: No recent fracture or dislocation is seen. Degenerative changes are noted with bony spurs in the first carpometacarpal joint. Osteopenia is seen in bony structures. There is soft tissue swelling over the dorsum. IMPRESSION: No recent fracture or dislocation is seen. Degenerative changes are noted in first carpometacarpal joint. Electronically Signed   By: Elmer Picker M.D.   On: 07/08/2021 16:27   DG Hand Complete Right  Result Date: 07/08/2021 CLINICAL DATA:  Right wrist inflammation. EXAM: RIGHT HAND - COMPLETE 3+ VIEW COMPARISON:  Jan 14, 2021 FINDINGS: The second through fifth right fingers are limited in evaluation secondary to their flexed position. There is no evidence of fracture or dislocation. Marked severity degenerative changes are seen involving the interphalangeal  joint of the right thumb and visualized interphalangeal joints of the second through fifth right fingers. Moderate severity dorsal soft tissue swelling is seen. IMPRESSION: Marked severity degenerative changes and dorsal soft tissue swelling, without evidence of acute osseous abnormality. Electronically Signed   By: Virgina Norfolk M.D.   On: 07/08/2021 16:24   DG Foot Complete Left  Result Date: 07/08/2021 CLINICAL DATA:  Pain and swelling EXAM: LEFT FOOT - COMPLETE 3+ VIEW COMPARISON:  01/14/2021 FINDINGS: No fracture or dislocation is seen. Osteopenia is seen in bony structures. No focal lytic lesions are seen. Degenerative changes are noted with bony spurs in the interphalangeal joints. There is soft tissue swelling over the dorsum. IMPRESSION: No recent fracture or dislocation is seen. Osteopenia. Degenerative changes are noted in the interphalangeal joints. Electronically Signed   By: Elmer Picker M.D.   On: 07/08/2021 16:24    Procedures Procedures   Medications Ordered in ED Medications  cefTRIAXone (ROCEPHIN) 1 g in sodium chloride 0.9 % 100 mL IVPB (1 g Intravenous New Bag/Given 07/08/21 1911)  colchicine tablet 0.6 mg (has no administration in time range)  losartan (COZAAR) tablet 100 mg (has no administration in time range)  metoprolol tartrate (LOPRESSOR) tablet 25 mg (has no administration in time range)  rosuvastatin (CRESTOR) tablet 10 mg (has no administration in time range)  levothyroxine (SYNTHROID) tablet 75 mcg (has no administration in time range)  metFORMIN (GLUCOPHAGE) tablet 500 mg (has no administration in time range)  gabapentin (NEURONTIN) capsule 200 mg (has no administration in time range)  diphenhydrAMINE (BENADRYL) tablet 25 mg (25 mg Oral Given 07/08/21 1905)  enoxaparin (LOVENOX) injection 40 mg (has no administration in time range)  acetaminophen (TYLENOL) tablet 650 mg (has no administration in time range)    Or  acetaminophen (TYLENOL) suppository  650 mg (has no administration in  time range)  ibuprofen (ADVIL) tablet 600 mg (has no administration in time range)  LORazepam (ATIVAN) tablet 0.5 mg (has no administration in time range)  insulin aspart (novoLOG) injection 0-15 Units (has no administration in time range)  cefTRIAXone (ROCEPHIN) 2 g in sodium chloride 0.9 % 100 mL IVPB (has no administration in time range)  potassium chloride SA (KLOR-CON) CR tablet 40 mEq (40 mEq Oral Given 07/08/21 1904)  ibuprofen (ADVIL) tablet 600 mg (600 mg Oral Given 07/08/21 1917)  morphine 4 MG/ML injection 4 mg (4 mg Intravenous Given 07/08/21 1907)  ondansetron (ZOFRAN) injection 4 mg (4 mg Intravenous Given 07/08/21 1905)    ED Course  I have reviewed the triage vital signs and the nursing notes.  Pertinent labs & imaging results that were available during my care of the patient were reviewed by me and considered in my medical decision making (see chart for details).    MDM Rules/Calculators/A&P                           75 yo F with a chief complaint of pain and swelling to multiple joints.  Patient's left lower extremity exam is consistent with cellulitis.  Possibly also concurrent cellulitis to the right wrist but less consistent.  Most likely I think to be septic arthritis based on my exam.  She is persistently tachycardic here has a white count of 12.6 thousand.  Will start on IV antibiotics.  Will discuss with medicine for admission.  She is not clinically septic.  The patients results and plan were reviewed and discussed.   Any x-rays performed were independently reviewed by myself.   Differential diagnosis were considered with the presenting HPI.  Medications  cefTRIAXone (ROCEPHIN) 1 g in sodium chloride 0.9 % 100 mL IVPB (1 g Intravenous New Bag/Given 07/08/21 1911)  colchicine tablet 0.6 mg (has no administration in time range)  losartan (COZAAR) tablet 100 mg (has no administration in time range)  metoprolol tartrate  (LOPRESSOR) tablet 25 mg (has no administration in time range)  rosuvastatin (CRESTOR) tablet 10 mg (has no administration in time range)  levothyroxine (SYNTHROID) tablet 75 mcg (has no administration in time range)  metFORMIN (GLUCOPHAGE) tablet 500 mg (has no administration in time range)  gabapentin (NEURONTIN) capsule 200 mg (has no administration in time range)  diphenhydrAMINE (BENADRYL) tablet 25 mg (25 mg Oral Given 07/08/21 1905)  enoxaparin (LOVENOX) injection 40 mg (has no administration in time range)  acetaminophen (TYLENOL) tablet 650 mg (has no administration in time range)    Or  acetaminophen (TYLENOL) suppository 650 mg (has no administration in time range)  ibuprofen (ADVIL) tablet 600 mg (has no administration in time range)  LORazepam (ATIVAN) tablet 0.5 mg (has no administration in time range)  insulin aspart (novoLOG) injection 0-15 Units (has no administration in time range)  cefTRIAXone (ROCEPHIN) 2 g in sodium chloride 0.9 % 100 mL IVPB (has no administration in time range)  potassium chloride SA (KLOR-CON) CR tablet 40 mEq (40 mEq Oral Given 07/08/21 1904)  ibuprofen (ADVIL) tablet 600 mg (600 mg Oral Given 07/08/21 1917)  morphine 4 MG/ML injection 4 mg (4 mg Intravenous Given 07/08/21 1907)  ondansetron (ZOFRAN) injection 4 mg (4 mg Intravenous Given 07/08/21 1905)    Vitals:   07/08/21 1431  BP: 135/70  Pulse: (!) 111  Resp: 14  Temp: 98.2 F (36.8 C)  TempSrc: Oral  SpO2: 99%    Final  diagnoses:  Cellulitis of left lower extremity    Admission/ observation were discussed with the admitting physician, patient and/or family and they are comfortable with the plan.   Final Clinical Impression(s) / ED Diagnoses Final diagnoses:  Cellulitis of left lower extremity    Rx / DC Orders ED Discharge Orders     None        Melene Plan, DO 07/08/21 1922

## 2021-07-09 ENCOUNTER — Inpatient Hospital Stay (HOSPITAL_COMMUNITY): Payer: Medicare Other

## 2021-07-09 DIAGNOSIS — I10 Essential (primary) hypertension: Secondary | ICD-10-CM

## 2021-07-09 DIAGNOSIS — R609 Edema, unspecified: Secondary | ICD-10-CM

## 2021-07-09 DIAGNOSIS — E876 Hypokalemia: Secondary | ICD-10-CM

## 2021-07-09 DIAGNOSIS — E871 Hypo-osmolality and hyponatremia: Secondary | ICD-10-CM

## 2021-07-09 DIAGNOSIS — M109 Gout, unspecified: Secondary | ICD-10-CM

## 2021-07-09 DIAGNOSIS — E118 Type 2 diabetes mellitus with unspecified complications: Secondary | ICD-10-CM

## 2021-07-09 DIAGNOSIS — L03113 Cellulitis of right upper limb: Secondary | ICD-10-CM | POA: Diagnosis not present

## 2021-07-09 DIAGNOSIS — E039 Hypothyroidism, unspecified: Secondary | ICD-10-CM

## 2021-07-09 DIAGNOSIS — L538 Other specified erythematous conditions: Secondary | ICD-10-CM

## 2021-07-09 DIAGNOSIS — M79642 Pain in left hand: Secondary | ICD-10-CM

## 2021-07-09 DIAGNOSIS — M79641 Pain in right hand: Secondary | ICD-10-CM

## 2021-07-09 DIAGNOSIS — L03116 Cellulitis of left lower limb: Secondary | ICD-10-CM | POA: Diagnosis not present

## 2021-07-09 LAB — CBC
HCT: 32.3 % — ABNORMAL LOW (ref 36.0–46.0)
Hemoglobin: 10.9 g/dL — ABNORMAL LOW (ref 12.0–15.0)
MCH: 30.4 pg (ref 26.0–34.0)
MCHC: 33.7 g/dL (ref 30.0–36.0)
MCV: 90 fL (ref 80.0–100.0)
Platelets: 376 10*3/uL (ref 150–400)
RBC: 3.59 MIL/uL — ABNORMAL LOW (ref 3.87–5.11)
RDW: 13.8 % (ref 11.5–15.5)
WBC: 13.6 10*3/uL — ABNORMAL HIGH (ref 4.0–10.5)
nRBC: 0 % (ref 0.0–0.2)

## 2021-07-09 LAB — MAGNESIUM: Magnesium: 1.9 mg/dL (ref 1.7–2.4)

## 2021-07-09 LAB — BASIC METABOLIC PANEL
Anion gap: 12 (ref 5–15)
BUN: 48 mg/dL — ABNORMAL HIGH (ref 8–23)
CO2: 25 mmol/L (ref 22–32)
Calcium: 8.8 mg/dL — ABNORMAL LOW (ref 8.9–10.3)
Chloride: 91 mmol/L — ABNORMAL LOW (ref 98–111)
Creatinine, Ser: 1 mg/dL (ref 0.44–1.00)
GFR, Estimated: 59 mL/min — ABNORMAL LOW (ref >60)
Glucose, Bld: 117 mg/dL — ABNORMAL HIGH (ref 70–99)
Potassium: 4.6 mmol/L (ref 3.5–5.1)
Sodium: 128 mmol/L — ABNORMAL LOW (ref 135–145)

## 2021-07-09 LAB — URIC ACID: Uric Acid, Serum: 6.2 mg/dL (ref 2.5–7.1)

## 2021-07-09 LAB — GLUCOSE, CAPILLARY
Glucose-Capillary: 77 mg/dL (ref 70–99)
Glucose-Capillary: 73 mg/dL (ref 70–99)
Glucose-Capillary: 110 mg/dL — ABNORMAL HIGH (ref 70–99)
Glucose-Capillary: 94 mg/dL (ref 70–99)

## 2021-07-09 LAB — HEPATITIS B SURFACE ANTIGEN: Hepatitis B Surface Ag: NONREACTIVE

## 2021-07-09 LAB — HIV ANTIBODY (ROUTINE TESTING W REFLEX): HIV Screen 4th Generation wRfx: NONREACTIVE

## 2021-07-09 LAB — HEPATITIS C ANTIBODY: HCV Ab: NONREACTIVE

## 2021-07-09 MED ORDER — PREDNISONE 20 MG PO TABS
30.0000 mg | ORAL_TABLET | Freq: Every day | ORAL | Status: AC
  Administered 2021-07-09 – 2021-07-13 (×5): 30 mg via ORAL
  Filled 2021-07-09 (×7): qty 1

## 2021-07-09 MED ORDER — SODIUM CHLORIDE 0.9 % IV SOLN
1.0000 g | INTRAVENOUS | Status: DC
  Administered 2021-07-09 – 2021-07-12 (×4): 1 g via INTRAVENOUS
  Filled 2021-07-09 (×4): qty 10

## 2021-07-09 MED ORDER — DOXYCYCLINE HYCLATE 100 MG PO TABS
100.0000 mg | ORAL_TABLET | Freq: Two times a day (BID) | ORAL | Status: AC
  Administered 2021-07-09 – 2021-07-14 (×12): 100 mg via ORAL
  Filled 2021-07-09 (×12): qty 1

## 2021-07-09 NOTE — Consult Note (Signed)
[facility]    Date of Admission:  07/08/2021   Total days of inpatient antibiotics 1        Reason for Consult: cellulitis    Active Problems:   Cellulitis   Assessment: 75 year old female with diabetes, gout on probenecid, osteoarthritis.  Admitted for left foot and right hand cellulitis.  There was concern for deep tissue infection of the right hand.  MRI on the right hand showed tenosynovitis.  Patient was started on ceftriaxone.  ID consulted for antibiotic recommendations.  #Left lower leg and right hand erythema/arthralgia #Right hand tenosynovitis #Hx of gout and osteoarthritis -As there are two concurrent sites of erythema, I don't think this is c/w cellulitis. Pt is followed by Rheumatology last visit was in May, 2022 for gout and osteoarthritis. Her RF was mildly elevated in the past. Will do a infectious work-up and add doxy for possible lyme(no tick exposure). Although pt will likely need steroids as she cannot move the ankle joint or hand.  Recommendations:  -Communicated with Pt's Rheumatologist(Dr Deveshwar) no contraindication to steroids.  -Considering starting steroids.  -MRI left foot -General Surgery  consult -Add doxycyline -Infectious work up: Lyme panel, HCV, HepB sAg, HIV, Parvo virus PCR -ANA and Uric Acid -Follow blood Cx Microbiology:   Antibiotics: 11/16 CTX  Cultures: 11/16 Blood Cx  HPI: UNIQUE SILLAS is a 75 y.o. female with gout on probenecid and arthritis, HTN, DMII, HLD presented for new onset right wrist and hand pain ans erythema.  She reports her left leg and right hand symptoms started about a week ago after she was raking leaves.  She has not been taking any antibiotics for this.  Her symptoms became so painful that and she could not move.She subsequenly presented to the ED.  On arrival to the ED,  WBC 12.6, x-ray of right hand revealed  soft tissue swelling with MRI showing flexor tendons  concerning for tenosynovitis.  X-ray left foot showed soft tissue swelling.  Patient was admitted for left foot cellulitis and concern of deep tissue infection of the right hand. She was started on ceftriaxone. ASO titer obtained.  Today, patient resting in bed.  She is unable to move her extremities as it leads to excruciating  pain she. Denies any prior episodes like this.  She denies fevers, chills. She reports no travel outside the [place]. Does not hike, has not noticed any ticks on her that she can recall. Denies any outdoor hobbies. Not high risk for STI exposure. Reports her gout episode involve her big toe and knees and are unlike what she is experiencing during current episode.    Review of Systems: Review of Systems  All other systems reviewed and are negative.  Past Medical History:  Diagnosis Date   Anemia    Arthritis    Diabetes mellitus without complication ([facility])    Gout    High cholesterol    Hypertension    Osteoporosis     Social History   Tobacco Use   Smoking status: Never   Smokeless tobacco: Never  Vaping Use   Vaping Use: Never used  Substance Use Topics   Alcohol use: No   Drug use: No    Family History  Problem Relation Age of Onset   Heart disease Mother    Stroke Mother    Hypertension Mother    Stroke Father    Colon cancer Sister    SIDS Son  Stroke Paternal Grandmother    Cancer Paternal Grandmother    ALS Brother    Stroke Maternal Grandmother    Cancer Paternal Grandfather    Testicular cancer Son    Down syndrome Son    Heart disease Brother    Scheduled Meds:  colchicine  0.6 mg Oral Daily   diphenhydrAMINE  25 mg Oral Q6H   enoxaparin (LOVENOX) injection  40 mg Subcutaneous Q24H   gabapentin  200 mg Oral QHS   insulin aspart  0-15 Units Subcutaneous TID WC   levothyroxine  75 mcg Oral Daily   losartan  100 mg Oral Daily   metFORMIN  500 mg Oral BID WC   metoprolol tartrate  25 mg Oral BID   rosuvastatin  10 mg Oral Daily    Continuous Infusions: PRN Meds:.acetaminophen **OR** acetaminophen, ibuprofen, LORazepam No Known Allergies  OBJECTIVE: Blood pressure (!) 143/68, pulse 70, temperature (!) 97.5 F (36.4 C), temperature source Oral, resp. rate 18, weight 75 kg, SpO2 100 %.  Physical Exam Musculoskeletal:     Comments: Right hand is tender to touch erythema present Left hand unable to mover, erythema to slightly below left knee.    Lab Results Lab Results  Component Value Date   WBC 13.6 (H) 07/09/2021   HGB 10.9 (L) 07/09/2021   HCT 32.3 (L) 07/09/2021   MCV 90.0 07/09/2021   PLT 376 07/09/2021    Lab Results  Component Value Date   CREATININE 1.00 07/09/2021   BUN 48 (H) 07/09/2021   NA 128 (L) 07/09/2021   K 4.6 07/09/2021   CL 91 (L) 07/09/2021   CO2 25 07/09/2021    Lab Results  Component Value Date   ALT 15 05/05/2021   AST 18 05/05/2021   ALKPHOS 75 05/05/2021   BILITOT 0.4 05/05/2021       Danelle Earthly, MD [facility] 07/09/2021, 2:23 PM
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
## 2021-07-09 NOTE — Progress Notes (Signed)
VASCULAR LAB    Left lower extremity venous duplex has been performed.  See CV proc for preliminary results.   Khamryn Calderone, RVT 07/09/2021, 12:15 PM

## 2021-07-10 ENCOUNTER — Inpatient Hospital Stay (HOSPITAL_COMMUNITY): Payer: Medicare Other

## 2021-07-10 DIAGNOSIS — E876 Hypokalemia: Secondary | ICD-10-CM | POA: Diagnosis not present

## 2021-07-10 DIAGNOSIS — E871 Hypo-osmolality and hyponatremia: Secondary | ICD-10-CM | POA: Diagnosis not present

## 2021-07-10 DIAGNOSIS — L03113 Cellulitis of right upper limb: Secondary | ICD-10-CM | POA: Diagnosis not present

## 2021-07-10 DIAGNOSIS — L03116 Cellulitis of left lower limb: Secondary | ICD-10-CM | POA: Diagnosis not present

## 2021-07-10 LAB — ENA+DNA/DS+ANTICH+CENTRO+JO...
Anti JO-1: <0.2 AI (ref 0.0–0.9)
Centromere Ab Screen: <0.2 AI (ref 0.0–0.9)
Chromatin Ab SerPl-aCnc: <0.2 AI (ref 0.0–0.9)
ENA SM Ab Ser-aCnc: <0.2 AI (ref 0.0–0.9)
Ribonucleic Protein: 1.7 AI — ABNORMAL HIGH (ref 0.0–0.9)
SSA (Ro) (ENA) Antibody, IgG: 5.5 AI — ABNORMAL HIGH (ref 0.0–0.9)
SSB (La) (ENA) Antibody, IgG: <0.2 AI (ref 0.0–0.9)
Scleroderma (Scl-70) (ENA) Antibody, IgG: 0.7 AI (ref 0.0–0.9)
ds DNA Ab: 1 IU/mL (ref 0–9)

## 2021-07-10 LAB — RENAL FUNCTION PANEL
Albumin: 2 g/dL — ABNORMAL LOW (ref 3.5–5.0)
Anion gap: 11 (ref 5–15)
BUN: 23 mg/dL (ref 8–23)
CO2: 23 mmol/L (ref 22–32)
Calcium: 9.2 mg/dL (ref 8.9–10.3)
Chloride: 100 mmol/L (ref 98–111)
Creatinine, Ser: 0.73 mg/dL (ref 0.44–1.00)
GFR, Estimated: >60 mL/min (ref >60)
Glucose, Bld: 131 mg/dL — ABNORMAL HIGH (ref 70–99)
Phosphorus: 2 mg/dL — ABNORMAL LOW (ref 2.5–4.6)
Potassium: 4.6 mmol/L (ref 3.5–5.1)
Sodium: 134 mmol/L — ABNORMAL LOW (ref 135–145)

## 2021-07-10 LAB — ANTISTREPTOLYSIN O TITER: ASO: 78 IU/mL (ref 0.0–200.0)

## 2021-07-10 LAB — CBC WITH DIFFERENTIAL/PLATELET
Abs Immature Granulocytes: 0.12 10*3/uL — ABNORMAL HIGH (ref 0.00–0.07)
Basophils Absolute: 0 10*3/uL (ref 0.0–0.1)
Basophils Relative: 0 %
Eosinophils Absolute: 0 10*3/uL (ref 0.0–0.5)
Eosinophils Relative: 0 %
HCT: 38.4 % (ref 36.0–46.0)
Hemoglobin: 12.3 g/dL (ref 12.0–15.0)
Immature Granulocytes: 1 %
Lymphocytes Relative: 11 %
Lymphs Abs: 1.4 10*3/uL (ref 0.7–4.0)
MCH: 29.8 pg (ref 26.0–34.0)
MCHC: 32 g/dL (ref 30.0–36.0)
MCV: 93 fL (ref 80.0–100.0)
Monocytes Absolute: 0.4 10*3/uL (ref 0.1–1.0)
Monocytes Relative: 3 %
Neutro Abs: 11 10*3/uL — ABNORMAL HIGH (ref 1.7–7.7)
Neutrophils Relative %: 85 %
Platelets: 413 10*3/uL — ABNORMAL HIGH (ref 150–400)
RBC: 4.13 MIL/uL (ref 3.87–5.11)
RDW: 13.6 % (ref 11.5–15.5)
WBC: 13 10*3/uL — ABNORMAL HIGH (ref 4.0–10.5)
nRBC: 0 % (ref 0.0–0.2)

## 2021-07-10 LAB — MAGNESIUM: Magnesium: 1.4 mg/dL — ABNORMAL LOW (ref 1.7–2.4)

## 2021-07-10 LAB — GLUCOSE, CAPILLARY
Glucose-Capillary: 163 mg/dL — ABNORMAL HIGH (ref 70–99)
Glucose-Capillary: 175 mg/dL — ABNORMAL HIGH (ref 70–99)
Glucose-Capillary: 136 mg/dL — ABNORMAL HIGH (ref 70–99)
Glucose-Capillary: 150 mg/dL — ABNORMAL HIGH (ref 70–99)

## 2021-07-10 LAB — ANA W/REFLEX IF POSITIVE: Anti Nuclear Antibody (ANA): POSITIVE — AB

## 2021-07-10 MED ORDER — INSULIN ASPART 100 UNIT/ML IJ SOLN: 0.0000 [IU] | Freq: Every day | INTRAMUSCULAR | Status: DC

## 2021-07-10 MED ORDER — INSULIN ASPART 100 UNIT/ML IJ SOLN
0.0000 [IU] | Freq: Three times a day (TID) | INTRAMUSCULAR | Status: DC
  Administered 2021-07-10 – 2021-07-11 (×3): 1 [IU] via SUBCUTANEOUS
  Administered 2021-07-12: 2 [IU] via SUBCUTANEOUS
  Administered 2021-07-13 – 2021-07-14 (×3): 1 [IU] via SUBCUTANEOUS
  Administered 2021-07-15: 3 [IU] via SUBCUTANEOUS
  Administered 2021-07-16: 1 [IU] via SUBCUTANEOUS
  Administered 2021-07-16 – 2021-07-18 (×6): 2 [IU] via SUBCUTANEOUS
  Administered 2021-07-18: 1 [IU] via SUBCUTANEOUS
  Administered 2021-07-20: 13:00:00 2 [IU] via SUBCUTANEOUS

## 2021-07-10 NOTE — Progress Notes (Signed)
PROGRESS NOTE  Is[place] PRF:[id number] DOB: [birth year]   PCP: Horald Pollen, MD  Patient is from: Home.  DOA: 07/08/2021 LOS: 2  Chief complaints:  No chief complaint on file.    Brief Narrative / Interim history: 75 year old F with PMH of gout, IDDM-2 with neuropathy, HTN and HLD presenting with pain, swelling and erythema in right forearm/hand and left lower leg and foot.  In ED, slightly tachycardic with mild leukocytosis.  X-ray of right hand and left lower extremity shows soft tissue swelling and severe degenerative arthritis.  Blood cultures obtained.  Started on IV ceftriaxone and admitted.  Infectious disease consulted.  MRI of the right hand ordered.  MRI hand concerning for tenosynovitis and inflammatory arthropathy.  Doxycycline added.  Also started on prednisone for possible gout. Other infectious work-up including tickborne disease and MRI of left foot pending.  Subjective: Seen and examined earlier this morning.  No major events overnight of this morning.  With significant pain in the right hand but able to extend the fingers better.  Significant improvement in LLE erythema and swelling.  Limited range of motion in right hand fingers, right wrist and right ankle due to pain.  Objective: Vitals:   07/09/21 2014 07/09/21 2357 07/10/21 0448 07/10/21 0909  BP: 118/67 140/75 (!) 144/69 130/67  Pulse: 88 83 77 80  Resp: _0 Temp: 99.5 F (37.5 C) 98.3 F (36.8 C) 98.2 F (36.8 C) 98.2 F (36.8 C)  TempSrc: Oral Oral Oral Oral  SpO2: 98% 97% 99% 99%  Weight:        Intake/Output Summary (Last 24 hours) at 07/10/2021 1538 Last data filed at 07/10/2021 1300 Gross per 24 hour  Intake 385.46 ml  Output 2800 ml  Net -2414.54 ml   Filed Weights   07/08/21 2004  Weight: 75 kg    Examination:  GENERAL: No apparent distress.  Nontoxic. HEENT: MMM.  Vision and hearing grossly intact.  NECK: Supple.  No apparent JVD.  RESP: On RA no  IWOB.  Fair aeration bilaterally. CVS:  RRR. Heart sounds normal.  ABD/GI/GU: BS+. Abd soft, NTND.  MSK/EXT:  Moves extremities.  Right hand and LLE edema. SKIN: Erythema, swelling and tenderness around right wrist, left ankle, left foot NEURO: Awake and alert. Oriented appropriately.  No apparent focal neuro deficit. PSYCH: Calm. Normal affect.    Procedures:  None  Microbiology summarized: DJTTS-17 and influenza PCR nonreactive. Blood cultures NGTD.  Assessment & Plan: Left lower extremity/right upper extremity cellulitis/arthralgia/right hand tenosynovitis: x-ray suggest cellulitis with arthritis.  MRI of right hand concerning for tenosynovitis and possible inflammatory arthropathy.  Patient has history of gout.  Uric acid 6.2.  Mild leukocytosis but no fever.  CRP and ESR markedly elevated concerning for inflammatory process.  Blood cultures NGTD.  Seems to be improving with antibiotic and prednisone.  Also on colchicine. -Appreciate help by ID  -MRI of left foot  -CTX and doxycycline  -General surgery or Ortho consult depending on MRI foot finding.   Controlled IDDM-2 with neuropathy and hyperlipidemia: A1c 5.6%.  On metformin 500 mg.  CBG within normal range. -Restart CBG monitoring and SSI while on steroid.  Essential hypertension: Normotensive. -Continue metoprolol and losartan.  Hypothyroidism -Continue home Synthroid.  Hyponatremia: SIADH?  Improved. -Continue monitoring   Hypokalemia: Resolved.  Gout -On colchicine and prednisone.    Body mass index is 29.29 kg/m.         DVT prophylaxis:  enoxaparin (LOVENOX) injection  40 mg Start: 07/08/21 2200  Code Status: Full code Family Communication: Patient and/or RN. Available if any question.  Level of care: Med-Surg Status is: Inpatient  Remains inpatient appropriate because: Need for antibiotics and further evaluation of possible right hand and left foot infection/inflammation        Consultants:   Infectious disease   Sch Meds:  Scheduled Meds:  colchicine  0.6 mg Oral Daily   diphenhydrAMINE  25 mg Oral Q6H   doxycycline  100 mg Oral Q12H   enoxaparin (LOVENOX) injection  40 mg Subcutaneous Q24H   gabapentin  200 mg Oral QHS   levothyroxine  75 mcg Oral Daily   losartan  100 mg Oral Daily   metFORMIN  500 mg Oral BID WC   metoprolol tartrate  25 mg Oral BID   predniSONE  30 mg Oral Q breakfast   rosuvastatin  10 mg Oral Daily   Continuous Infusions:  cefTRIAXone (ROCEPHIN)  IV 1 g (07/09/21 1736)   PRN Meds:.acetaminophen **OR** acetaminophen, LORazepam  Antimicrobials: Anti-infectives (From admission, onward)    Start     Dose/Rate Route Frequency Ordered Stop   07/09/21 1730  cefTRIAXone (ROCEPHIN) 1 g in sodium chloride 0.9 % 100 mL IVPB        1 g 200 mL/hr over 30 Minutes Intravenous Every 24 hours 07/09/21 1641 07/14/21 1729   07/09/21 1515  doxycycline (VIBRA-TABS) tablet 100 mg        100 mg Oral Every 12 hours 07/09/21 1426     07/09/21 1000  cefTRIAXone (ROCEPHIN) 2 g in sodium chloride 0.9 % 100 mL IVPB  Status:  Discontinued        2 g 200 mL/hr over 30 Minutes Intravenous Every 24 hours 07/08/21 1806 07/09/21 1015   07/08/21 1700  cefTRIAXone (ROCEPHIN) 1 g in sodium chloride 0.9 % 100 mL IVPB        1 g 200 mL/hr over 30 Minutes Intravenous  Once 07/08/21 1646 07/08/21 1941        I have personally reviewed the following labs and images: CBC: Recent Labs  Lab 07/08/21 1439 07/09/21 0101 07/10/21 1024  WBC 12.6* 13.6* 13.0*  NEUTROABS 9.1*  --  11.0*  HGB 11.8* 10.9* 12.3  HCT 35.2* 32.3* 38.4  MCV 90.5 90.0 93.0  PLT 405* 376 413*   BMP &GFR Recent Labs  Lab 07/08/21 1439 07/08/21 2107 07/09/21 0101 07/10/21 1024  NA 129*  --  128* 134*  K 3.4*  --  4.6 4.6  CL 88*  --  91* 100  CO2 26  --  25 23  GLUCOSE 119*  --  117* 131*  BUN 46*  --  48* 23  CREATININE 0.99  --  1.00 0.73  CALCIUM 9.2  --  8.8* 9.2  MG  --  1.9  --   1.4*  PHOS  --   --   --  2.0*   Estimated Creatinine Clearance: 59.8 mL/min (by C-G formula based on SCr of 0.73 mg/dL). Liver & Pancreas: Recent Labs  Lab 07/10/21 1024  ALBUMIN 2.0*   No results for input(s): LIPASE, AMYLASE in the last 168 hours. No results for input(s): AMMONIA in the last 168 hours. Diabetic: Recent Labs    07/08/21 2107  HGBA1C 5.6   Recent Labs  Lab 07/09/21 1221 07/09/21 1638 07/09/21 2054 07/10/21 0911 07/10/21 1204  GLUCAP 73 110* 94 163* 175*   Cardiac Enzymes: No results for input(s): CKTOTAL, CKMB,  CKMBINDEX, TROPONINI in the last 168 hours. No results for input(s): PROBNP in the last 8760 hours. Coagulation Profile: No results for input(s): INR, PROTIME in the last 168 hours. Thyroid Function Tests: No results for input(s): TSH, T4TOTAL, FREET4, T3FREE, THYROIDAB in the last 72 hours. Lipid Profile: No results for input(s): CHOL, HDL, LDLCALC, TRIG, CHOLHDL, LDLDIRECT in the last 72 hours. Anemia Panel: No results for input(s): VITAMINB12, FOLATE, FERRITIN, TIBC, IRON, RETICCTPCT in the last 72 hours. Urine analysis:    Component Value Date/Time   BILIRUBINUR neg 05/08/2013 1634   PROTEINUR neg 05/08/2013 1634   UROBILINOGEN 1.0 05/08/2013 1634   NITRITE neg 05/08/2013 1634   LEUKOCYTESUR Negative 05/08/2013 1634   Sepsis Labs: Invalid input(s): PROCALCITONIN, [place]  Microbiology: Recent Results (from the past 240 hour(s))  Resp Panel by RT-PCR (Flu A&B, Covid) Nasopharyngeal Swab     Status: None   Collection Time: 07/08/21  4:47 PM   Specimen: Nasopharyngeal Swab; Nasopharyngeal(NP) swabs in vial transport medium  Result Value Ref Range Status   SARS Coronavirus 2 by RT PCR NEGATIVE NEGATIVE Final    Comment: (NOTE) SARS-CoV-2 target nucleic acids are NOT DETECTED.  The SARS-CoV-2 RNA is generally detectable in upper respiratory specimens during the acute phase of infection. The lowest concentration of SARS-CoV-2  viral copies this assay can detect is 138 copies/mL. A negative result does not preclude SARS-Cov-2 infection and should not be used as the sole basis for treatment or other patient management decisions. A negative result may occur with  improper specimen collection/handling, submission of specimen other than nasopharyngeal swab, presence of viral mutation(s) within the areas targeted by this assay, and inadequate number of viral copies(<138 copies/mL). A negative result must be combined with clinical observations, patient history, and epidemiological information. The expected result is Negative.  Fact Sheet for Patients:  EntrepreneurPulse.com.au  Fact Sheet for Healthcare Providers:  IncredibleEmployment.be  This test is no t yet approved or cleared by the [place] FDA and  has been authorized for detection and/or diagnosis of SARS-CoV-2 by FDA under an Emergency Use Authorization (EUA). This EUA will remain  in effect (meaning this test can be used) for the duration of the COVID-19 declaration under Section 564(b)(1) of the Act, 21 U.S.C.section 360bbb-3(b)(1), unless the authorization is terminated  or revoked sooner.       Influenza A by PCR NEGATIVE NEGATIVE Final   Influenza B by PCR NEGATIVE NEGATIVE Final    Comment: (NOTE) The Xpert Xpress SARS-CoV-2/FLU/RSV plus assay is intended as an aid in the diagnosis of influenza from Nasopharyngeal swab specimens and should not be used as a sole basis for treatment. Nasal washings and aspirates are unacceptable for Xpert Xpress SARS-CoV-2/FLU/RSV testing.  Fact Sheet for Patients: EntrepreneurPulse.com.au  Fact Sheet for Healthcare Providers: IncredibleEmployment.be  This test is not yet approved or cleared by the [place] FDA and has been authorized for detection and/or diagnosis of SARS-CoV-2 by FDA under an Emergency Use Authorization  (EUA). This EUA will remain in effect (meaning this test can be used) for the duration of the COVID-19 declaration under Section 564(b)(1) of the Act, 21 U.S.C. section 360bbb-3(b)(1), unless the authorization is terminated or revoked.  Performed at [facility], [place] [address] [place], [place] [address]   Blood culture (routine x 2)     Status: None (Preliminary result)   Collection Time: 07/08/21  6:43 PM   Specimen: BLOOD LEFT ARM  Result Value Ref Range Status   Specimen  Description BLOOD LEFT ARM  Final   Special Requests   Final    BOTTLES DRAWN AEROBIC AND ANAEROBIC Blood Culture adequate volume   Culture   Final    NO GROWTH 2 DAYS Performed at [facility], [address] [place], [place] [address]    Report Status PENDING  Incomplete  Blood culture (routine x 2)     Status: None (Preliminary result)   Collection Time: 07/08/21  8:54 PM   Specimen: BLOOD RIGHT ARM  Result Value Ref Range Status   Specimen Description BLOOD RIGHT ARM  Final   Special Requests   Final    BOTTLES DRAWN AEROBIC AND ANAEROBIC Blood Culture results may not be optimal due to an inadequate volume of blood received in culture bottles   Culture   Final    NO GROWTH 2 DAYS Performed at [facility], [place] [address] [place],  [address]    Report Status PENDING  Incomplete    Radiology Studies: No results found.     Katianna Mcclenney T. [place]  If 7PM-7AM, please contact night-coverage [email or web address] 07/10/2021, 3:38 PM

## 2021-07-10 NOTE — Progress Notes (Signed)
[facility]  Date of Admission:  07/08/2021   Total days of inpatient antibiotics 2  Active Problems:   Cellulitis          Assessment: 75 year old female with diabetes, gout on probenecid, osteoarthritis.  Admitted for left foot and right hand cellulitis.  There was concern for deep tissue infection of the right hand.  MRI on the right hand showed tenosynovitis.  Patient was started on ceftriaxone.  ID consulted for antibiotic recommendations.   #Left lower leg and right hand erythema/arthralgia #Right hand tenosynovitis #Hx of gout and osteoarthritis -As there are two concurrent sites of erythema, I don't think this is c/w cellulitis. Pt is followed by Rheumatology last visit was in May, 2022 for gout and osteoarthritis. Her RF was mildly elevated in the past.  Infectious work-up pending and and  doxy added for possible lyme(no tick exposure).  -Pt started on prednisone on 11/17, symptoms seemed to have improved slightly  -Uric acid wnl on 11/14,  -HCV, HepB sAg, HIV negative Recommendations:  -Continue steroids -MRI left foot -General Surgery  consult -Continue doxycyline and ceftriaxone for now. Will address the need for ceftriaxone after MRI. As pt has improved on steroids suspect this is rheumatologic in etiology. -Follow-up infectious work up: Lyme panel, Parvo virus PCR -ANA  -Follow blood Cx from 11/16 Microbiology:   Antibiotics: 11/16 CTX -p 11/17 doxy-p Cultures: 11/16 Blood Cx  Subjective: Afebrile overnight. No new complaints. No significant overnight events.   Review of Systems: Review of Systems  All other systems reviewed and are negative.   Scheduled Meds: . colchicine  0.6 mg Oral Daily  . diphenhydrAMINE  25 mg Oral Q6H  . doxycycline  100 mg Oral Q12H  . enoxaparin (LOVENOX) injection  40 mg Subcutaneous Q24H  . gabapentin  200 mg Oral QHS  . levothyroxine  75 mcg Oral Daily  . losartan  100 mg Oral Daily  .  metFORMIN  500 mg Oral BID WC  . metoprolol tartrate  25 mg Oral BID  . predniSONE  30 mg Oral Q breakfast  . rosuvastatin  10 mg Oral Daily   Continuous Infusions: . cefTRIAXone (ROCEPHIN)  IV 1 g (07/09/21 1736)   PRN Meds:.acetaminophen **OR** acetaminophen, LORazepam No Known Allergies  OBJECTIVE: Vitals:   07/09/21 2014 07/09/21 2357 07/10/21 0448 07/10/21 0909  BP: 118/67 140/75 (!) 144/69 130/67  Pulse: 88 83 77 80  Resp: 19 20 18 19   Temp: 99.5 F (37.5 C) 98.3 F (36.8 C) 98.2 F (36.8 C) 98.2 F (36.8 C)  TempSrc: Oral Oral Oral Oral  SpO2: 98% 97% 99% 99%  Weight:       Body mass index is 29.29 kg/m.  Physical Exam Constitutional:      Appearance: Normal appearance.  HENT:     Head: Normocephalic and atraumatic.     Right Ear: Tympanic membrane normal.     Left Ear: Tympanic membrane normal.     Nose: Nose normal.     Mouth/Throat:     Mouth: Mucous membranes are moist.  Eyes:     Extraocular Movements: Extraocular movements intact.     Conjunctiva/sclera: Conjunctivae normal.     Pupils: Pupils are equal, round, and reactive to light.  Cardiovascular:     Rate and Rhythm: Normal rate and regular rhythm.     Heart sounds: No murmur heard.   No friction rub. No gallop.  Pulmonary:  Effort: Pulmonary effort is normal.     Breath sounds: Normal breath sounds.  Abdominal:     General: Abdomen is flat.     Palpations: Abdomen is soft.  Skin:    General: Skin is warm and dry.     Comments: Left foot edema has improved, erythema and tenderness slightly improved.  Pt rs able to open  up her hand slightly more, erythema slightly improved  Neurological:     General: No focal deficit present.     Mental Status: She is alert and oriented to person, place, and time.  Psychiatric:        Mood and Affect: Mood normal.      Lab Results Lab Results  Component Value Date   WBC 13.6 (H) 07/09/2021   HGB 10.9 (L) 07/09/2021   HCT 32.3 (L) 07/09/2021    MCV 90.0 07/09/2021   PLT 376 07/09/2021    Lab Results  Component Value Date   CREATININE 1.00 07/09/2021   BUN 48 (H) 07/09/2021   NA 128 (L) 07/09/2021   K 4.6 07/09/2021   CL 91 (L) 07/09/2021   CO2 25 07/09/2021    Lab Results  Component Value Date   ALT 15 05/05/2021   AST 18 05/05/2021   ALKPHOS 75 05/05/2021   BILITOT 0.4 05/05/2021        Danelle Earthly, MD [facility] 07/10/2021, 10:45 AM

## 2021-07-10 NOTE — Care Management Important Message (Signed)
Important Message  Patient Details  Name: Isabella [place] MRN: [id number] Date of Birth: May 29, 1946   Medicare Important Message Given:  Yes     Dorena Bodo 07/10/2021, 2:03 PM

## 2021-07-11 DIAGNOSIS — E871 Hypo-osmolality and hyponatremia: Secondary | ICD-10-CM | POA: Diagnosis not present

## 2021-07-11 DIAGNOSIS — L03113 Cellulitis of right upper limb: Secondary | ICD-10-CM | POA: Diagnosis not present

## 2021-07-11 DIAGNOSIS — L03116 Cellulitis of left lower limb: Secondary | ICD-10-CM | POA: Diagnosis not present

## 2021-07-11 DIAGNOSIS — E876 Hypokalemia: Secondary | ICD-10-CM | POA: Diagnosis not present

## 2021-07-11 LAB — CBC
HCT: 36.9 % (ref 36.0–46.0)
Hemoglobin: 11.7 g/dL — ABNORMAL LOW (ref 12.0–15.0)
MCH: 29.5 pg (ref 26.0–34.0)
MCHC: 31.7 g/dL (ref 30.0–36.0)
MCV: 92.9 fL (ref 80.0–100.0)
Platelets: 478 10*3/uL — ABNORMAL HIGH (ref 150–400)
RBC: 3.97 MIL/uL (ref 3.87–5.11)
RDW: 13.6 % (ref 11.5–15.5)
WBC: 12.7 10*3/uL — ABNORMAL HIGH (ref 4.0–10.5)
nRBC: 0 % (ref 0.0–0.2)

## 2021-07-11 LAB — RENAL FUNCTION PANEL
Albumin: 2 g/dL — ABNORMAL LOW (ref 3.5–5.0)
Anion gap: 10 (ref 5–15)
BUN: 20 mg/dL (ref 8–23)
CO2: 23 mmol/L (ref 22–32)
Calcium: 9.2 mg/dL (ref 8.9–10.3)
Chloride: 104 mmol/L (ref 98–111)
Creatinine, Ser: 0.71 mg/dL (ref 0.44–1.00)
GFR, Estimated: >60 mL/min (ref >60)
Glucose, Bld: 144 mg/dL — ABNORMAL HIGH (ref 70–99)
Phosphorus: 2.2 mg/dL — ABNORMAL LOW (ref 2.5–4.6)
Potassium: 4.8 mmol/L (ref 3.5–5.1)
Sodium: 137 mmol/L (ref 135–145)

## 2021-07-11 LAB — URINALYSIS, ROUTINE W REFLEX MICROSCOPIC
Bilirubin Urine: NEGATIVE
Glucose, UA: NEGATIVE mg/dL
Hgb urine dipstick: NEGATIVE
Ketones, ur: NEGATIVE mg/dL
Leukocytes,Ua: NEGATIVE
Nitrite: NEGATIVE
Protein, ur: NEGATIVE mg/dL
Specific Gravity, Urine: 1.011 (ref 1.005–1.030)
pH: 6 (ref 5.0–8.0)

## 2021-07-11 LAB — GLUCOSE, CAPILLARY
Glucose-Capillary: 92 mg/dL (ref 70–99)
Glucose-Capillary: 140 mg/dL — ABNORMAL HIGH (ref 70–99)
Glucose-Capillary: 158 mg/dL — ABNORMAL HIGH (ref 70–99)
Glucose-Capillary: 170 mg/dL — ABNORMAL HIGH (ref 70–99)

## 2021-07-11 LAB — LYME DISEASE SEROLOGY W/REFLEX: Lyme Total Antibody EIA: NEGATIVE

## 2021-07-11 LAB — MAGNESIUM: Magnesium: 1.2 mg/dL — ABNORMAL LOW (ref 1.7–2.4)

## 2021-07-11 MED ORDER — NAPROXEN 250 MG PO TABS
500.0000 mg | ORAL_TABLET | Freq: Two times a day (BID) | ORAL | Status: AC
  Administered 2021-07-11 – 2021-07-14 (×8): 500 mg via ORAL
  Filled 2021-07-11 (×8): qty 2

## 2021-07-11 MED ORDER — MAGNESIUM SULFATE 4 GM/100ML IV SOLN
4.0000 g | Freq: Once | INTRAVENOUS | Status: AC
  Administered 2021-07-11: 4 g via INTRAVENOUS
  Filled 2021-07-11: qty 100

## 2021-07-11 MED ORDER — OXYCODONE HCL 5 MG PO TABS
5.0000 mg | ORAL_TABLET | Freq: Three times a day (TID) | ORAL | Status: DC | PRN
  Administered 2021-07-18: 5 mg via ORAL
  Filled 2021-07-11: qty 1

## 2021-07-11 MED ORDER — DICLOFENAC SODIUM 1 % EX GEL
4.0000 g | Freq: Four times a day (QID) | CUTANEOUS | Status: DC
  Administered 2021-07-11 – 2021-07-20 (×37): 4 g via TOPICAL
  Filled 2021-07-11 (×2): qty 100

## 2021-07-11 NOTE — Progress Notes (Signed)
PROGRESS NOTE  Isabella Dixon:[id number] DOB: 08-06-1946   PCP: Horald Pollen, MD  Patient is from: Home.  DOA: 07/08/2021 LOS: 3  Chief complaints:  No chief complaint on file.    Brief Narrative / Interim history: 75 year old F with PMH of gout, IDDM-2 with neuropathy, HTN and HLD presenting with pain, swelling and erythema in right forearm/hand and left lower leg and foot.  In ED, slightly tachycardic with mild leukocytosis.  X-ray of right hand and left lower extremity shows soft tissue swelling and severe degenerative arthritis.  Blood cultures obtained.  Started on IV ceftriaxone and admitted.  Infectious disease consulted.  MRI of the right hand ordered.  MRI hand concerning for tenosynovitis and inflammatory arthropathy.  Doxycycline added.  Also started on prednisone for possible gout.  MRI left foot suggesting cellulitis/osteoarthritis.  [facility] consulted for right arm tenosynovitis.  ID following.  Subjective: Seen and examined earlier this morning.  She reports severe pain in right great toe last night that she attributes to gout.  Pain has improved this morning.  There is no notable swelling, erythema or tenderness.  LLE redness, swelling and pain improved.  Still with significant pain in right wrist, and very limited ROM in wrist and her fingers.  She also stated that she was refusing colchicine until this morning.  Objective: Vitals:   07/10/21 2042 07/11/21 0445 07/11/21 0813 07/11/21 1614  BP: 127/74 (!) 164/73 (!) 146/64 105/88  Pulse: 85 76 66 69  Resp: _0 Temp: 98.1 F (36.7 C) 98.4 F (36.9 C) 97.7 F (36.5 C) 97.8 F (36.6 C)  TempSrc: Oral Oral Oral Oral  SpO2: 96% 97% 96% 96%  Weight:      Height:    _1  (1.6 m)    Intake/Output Summary (Last 24 hours) at 07/11/2021 1820 Last data filed at 07/11/2021 1450 Gross per 24 hour  Intake --  Output 2100 ml  Net -2100 ml   Filed Weights   07/08/21 2004  Weight: 75 kg     Examination:  GENERAL: No apparent distress.  Nontoxic. HEENT: MMM.  Vision and hearing grossly intact.  NECK: Supple.  No apparent JVD.  RESP: On RA.  No IWOB.  Fair aeration bilaterally. CVS:  RRR. Heart sounds normal.  ABD/GI/GU: BS+. Abd soft, NTND.  MSK/EXT:  Moves extremities.  Improved erythema around right wrist, left ankle and left foot.  Persistent swelling around right wrist.  Limited ROM in right wrist and fingers.  See pictures under media for more. SKIN: As above. NEURO: Awake and alert. Oriented appropriately.  No apparent focal neuro deficit. PSYCH: Calm. Normal affect.    Procedures:  None  Microbiology summarized: [id number] and influenza PCR nonreactive. Blood cultures NGTD.  Assessment & Plan: Left lower extremity/right upper extremity cellulitis/arthralgia/right hand tenosynovitis: x-ray suggest cellulitis with arthritis.  MRI of right hand concerning for tenosynovitis and possible inflammatory arthropathy.  MRI of left foot suggestive of cellulitis and osteoarthritis.  Patient has history of gout.  Uric acid 6.2.  Mild leukocytosis but no fever.  CRP and ESR markedly elevated concerning for inflammatory process.  Blood cultures NGTD.  Lyme antibody negative.  AntidsDNA negative.  SSA (Ro) elevated.  Seems to be improving with antibiotic and prednisone.  She was refusing colchicine until this morning. -Appreciate help by ID -[facility] consulted for right hand tenosynovitis. -Continue CTX and doxycycline -Continue prednisone and colchicine. -Added naproxen, Voltaren gel and as needed oxycodone   Controlled IDDM-2  with neuropathy and hyperlipidemia: A1c 5.6%.  On metformin 500 mg.  CBG within normal range. Recent Labs  Lab 07/10/21 1605 07/10/21 2045 07/11/21 0814 07/11/21 1147 07/11/21 1612  GLUCAP 136* 150* 92 140* 158*  -Continue CBG monitoring and SSI while on steroid.   Essential hypertension: BP slightly elevated earlier in the morning likely  from pain. -Pain management as above -Continue metoprolol and losartan.  Hypothyroidism -Continue home Synthroid.  Hyponatremia: SIADH?  Improved. -Continue monitoring   Hypokalemia/hypomagnesemia: Hypokalemia resolved.  Mg 1.2. -IV magnesium sulfate 4 g x 1 -Recheck in the morning  Gout -On colchicine, prednisone and naproxen as above.    Body mass index is 29.29 kg/m.         DVT prophylaxis:  enoxaparin (LOVENOX) injection 40 mg Start: 07/08/21 2200  Code Status: Full code Family Communication: Patient and/or RN. Available if any question.  Level of care: Med-Surg Status is: Inpatient  Remains inpatient appropriate because: Need for antibiotics and further evaluation of possible right hand and left foot infection/inflammation        Consultants:  Infectious disease   Sch Meds:  Scheduled Meds:  colchicine  0.6 mg Oral Daily   diclofenac Sodium  4 g Topical QID   diphenhydrAMINE  25 mg Oral Q6H   doxycycline  100 mg Oral Q12H   enoxaparin (LOVENOX) injection  40 mg Subcutaneous Q24H   gabapentin  200 mg Oral QHS   insulin aspart  0-5 Units Subcutaneous QHS   insulin aspart  0-9 Units Subcutaneous TID WC   levothyroxine  75 mcg Oral Daily   losartan  100 mg Oral Daily   metFORMIN  500 mg Oral BID WC   metoprolol tartrate  25 mg Oral BID   naproxen  500 mg Oral BID WC   predniSONE  30 mg Oral Q breakfast   rosuvastatin  10 mg Oral Daily   Continuous Infusions:  cefTRIAXone (ROCEPHIN)  IV 1 g (07/11/21 1705)   PRN Meds:.acetaminophen **OR** acetaminophen, LORazepam, oxyCODONE  Antimicrobials: Anti-infectives (From admission, onward)    Start     Dose/Rate Route Frequency Ordered Stop   07/09/21 1730  cefTRIAXone (ROCEPHIN) 1 g in sodium chloride 0.9 % 100 mL IVPB        1 g 200 mL/hr over 30 Minutes Intravenous Every 24 hours 07/09/21 1641 07/14/21 1729   07/09/21 1515  doxycycline (VIBRA-TABS) tablet 100 mg        100 mg Oral Every 12 hours  07/09/21 1426     07/09/21 1000  cefTRIAXone (ROCEPHIN) 2 g in sodium chloride 0.9 % 100 mL IVPB  Status:  Discontinued        2 g 200 mL/hr over 30 Minutes Intravenous Every 24 hours 07/08/21 1806 07/09/21 1015   07/08/21 1700  cefTRIAXone (ROCEPHIN) 1 g in sodium chloride 0.9 % 100 mL IVPB        1 g 200 mL/hr over 30 Minutes Intravenous  Once 07/08/21 1646 07/08/21 1941        I have personally reviewed the following labs and images: CBC: Recent Labs  Lab 07/08/21 1439 07/09/21 0101 07/10/21 1024 07/11/21 1126  WBC 12.6* 13.6* 13.0* 12.7*  NEUTROABS 9.1*  --  11.0*  --   HGB 11.8* 10.9* 12.3 11.7*  HCT 35.2* 32.3* 38.4 36.9  MCV 90.5 90.0 93.0 92.9  PLT 405* 376 413* 478*   BMP &GFR Recent Labs  Lab 07/08/21 1439 07/08/21 2107 07/09/21 0101 07/10/21 1024 07/11/21 1126  NA 129*  --  128* 134* 137  K 3.4*  --  4.6 4.6 4.8  CL 88*  --  91* 100 104  CO2 26  --  _0 GLUCOSE 119*  --  117* 131* 144*  BUN 46*  --  48* 23 20  CREATININE 0.99  --  1.00 0.73 0.71  CALCIUM 9.2  --  8.8* 9.2 9.2  MG  --  1.9  --  1.4* 1.2*  PHOS  --   --   --  2.0* 2.2*   Estimated Creatinine Clearance: 59.8 mL/min (by C-G formula based on SCr of 0.71 mg/dL). Liver & Pancreas: Recent Labs  Lab 07/10/21 1024 07/11/21 1126  ALBUMIN 2.0* 2.0*   No results for input(s): LIPASE, AMYLASE in the last 168 hours. No results for input(s): AMMONIA in the last 168 hours. Diabetic: Recent Labs    07/08/21 2107  HGBA1C 5.6   Recent Labs  Lab 07/10/21 1605 07/10/21 2045 07/11/21 0814 07/11/21 1147 07/11/21 1612  GLUCAP 136* 150* 92 140* 158*   Cardiac Enzymes: No results for input(s): CKTOTAL, CKMB, CKMBINDEX, TROPONINI in the last 168 hours. No results for input(s): PROBNP in the last 8760 hours. Coagulation Profile: No results for input(s): INR, PROTIME in the last 168 hours. Thyroid Function Tests: No results for input(s): TSH, T4TOTAL, FREET4, T3FREE, THYROIDAB in the  last 72 hours. Lipid Profile: No results for input(s): CHOL, HDL, LDLCALC, TRIG, CHOLHDL, LDLDIRECT in the last 72 hours. Anemia Panel: No results for input(s): VITAMINB12, FOLATE, FERRITIN, TIBC, IRON, RETICCTPCT in the last 72 hours. Urine analysis:    Component Value Date/Time   BILIRUBINUR neg 05/08/2013 1634   PROTEINUR neg 05/08/2013 1634   UROBILINOGEN 1.0 05/08/2013 1634   NITRITE neg 05/08/2013 1634   LEUKOCYTESUR Negative 05/08/2013 1634   Sepsis Labs: Invalid input(s): PROCALCITONIN, [place]  Microbiology: Recent Results (from the past 240 hour(s))  Resp Panel by RT-PCR (Flu A&B, Covid) Nasopharyngeal Swab     Status: None   Collection Time: 07/08/21  4:47 PM   Specimen: Nasopharyngeal Swab; Nasopharyngeal(NP) swabs in vial transport medium  Result Value Ref Range Status   SARS Coronavirus 2 by RT PCR NEGATIVE NEGATIVE Final    Comment: (NOTE) SARS-CoV-2 target nucleic acids are NOT DETECTED.  The SARS-CoV-2 RNA is generally detectable in upper respiratory specimens during the acute phase of infection. The lowest concentration of SARS-CoV-2 viral copies this assay can detect is 138 copies/mL. A negative result does not preclude SARS-Cov-2 infection and should not be used as the sole basis for treatment or other patient management decisions. A negative result may occur with  improper specimen collection/handling, submission of specimen other than nasopharyngeal swab, presence of viral mutation(s) within the areas targeted by this assay, and inadequate number of viral copies(<138 copies/mL). A negative result must be combined with clinical observations, patient history, and epidemiological information. The expected result is Negative.  Fact Sheet for Patients:  EntrepreneurPulse.com.au  Fact Sheet for Healthcare Providers:  IncredibleEmployment.be  This test is no t yet approved or cleared by the [place] FDA and  has  been authorized for detection and/or diagnosis of SARS-CoV-2 by FDA under an Emergency Use Authorization (EUA). This EUA will remain  in effect (meaning this test can be used) for the duration of the COVID-19 declaration under Section 564(b)(1) of the Act, 21 U.S.C.section 360bbb-3(b)(1), unless the authorization is terminated  or revoked sooner.       Influenza A by  PCR NEGATIVE NEGATIVE Final   Influenza B by PCR NEGATIVE NEGATIVE Final    Comment: (NOTE) The Xpert Xpress SARS-CoV-2/FLU/RSV plus assay is intended as an aid in the diagnosis of influenza from Nasopharyngeal swab specimens and should not be used as a sole basis for treatment. Nasal washings and aspirates are unacceptable for Xpert Xpress SARS-CoV-2/FLU/RSV testing.  Fact Sheet for Patients: EntrepreneurPulse.com.au  Fact Sheet for Healthcare Providers: IncredibleEmployment.be  This test is not yet approved or cleared by the [place] FDA and has been authorized for detection and/or diagnosis of SARS-CoV-2 by FDA under an Emergency Use Authorization (EUA). This EUA will remain in effect (meaning this test can be used) for the duration of the COVID-19 declaration under Section 564(b)(1) of the Act, 21 U.S.C. section 360bbb-3(b)(1), unless the authorization is terminated or revoked.  Performed at [facility], [place] [address], [place], [place] [address]   Blood culture (routine x 2)     Status: None (Preliminary result)   Collection Time: 07/08/21  6:43 PM   Specimen: BLOOD LEFT ARM  Result Value Ref Range Status   Specimen Description BLOOD LEFT ARM  Final   Special Requests   Final    BOTTLES DRAWN AEROBIC AND ANAEROBIC Blood Culture adequate volume   Culture   Final    NO GROWTH 3 DAYS Performed at [facility], [place] [address] [place], [place] [address]    Report Status PENDING  Incomplete  Blood culture (routine x 2)     Status: None (Preliminary  result)   Collection Time: 07/08/21  8:54 PM   Specimen: BLOOD RIGHT ARM  Result Value Ref Range Status   Specimen Description BLOOD RIGHT ARM  Final   Special Requests   Final    BOTTLES DRAWN AEROBIC AND ANAEROBIC Blood Culture results may not be optimal due to an inadequate volume of blood received in culture bottles   Culture   Final    NO GROWTH 3 DAYS Performed at [facility], [place] [address] [place], [place] [address]    Report Status PENDING  Incomplete    Radiology Studies: No results found.     Trayson Stitely T. [place]  If 7PM-7AM, please contact night-coverage [email or web address] 07/11/2021, 6:20 PM

## 2021-07-12 DIAGNOSIS — L03113 Cellulitis of right upper limb: Secondary | ICD-10-CM | POA: Diagnosis not present

## 2021-07-12 LAB — CBC
HCT: 34.8 % — ABNORMAL LOW (ref 36.0–46.0)
Hemoglobin: 11.2 g/dL — ABNORMAL LOW (ref 12.0–15.0)
MCH: 30.1 pg (ref 26.0–34.0)
MCHC: 32.2 g/dL (ref 30.0–36.0)
MCV: 93.5 fL (ref 80.0–100.0)
Platelets: 476 10*3/uL — ABNORMAL HIGH (ref 150–400)
RBC: 3.72 MIL/uL — ABNORMAL LOW (ref 3.87–5.11)
RDW: 13.4 % (ref 11.5–15.5)
WBC: 13.3 10*3/uL — ABNORMAL HIGH (ref 4.0–10.5)
nRBC: 0 % (ref 0.0–0.2)

## 2021-07-12 LAB — RENAL FUNCTION PANEL
Albumin: 2 g/dL — ABNORMAL LOW (ref 3.5–5.0)
Anion gap: 7 (ref 5–15)
BUN: 22 mg/dL (ref 8–23)
CO2: 27 mmol/L (ref 22–32)
Calcium: 8.8 mg/dL — ABNORMAL LOW (ref 8.9–10.3)
Chloride: 102 mmol/L (ref 98–111)
Creatinine, Ser: 0.61 mg/dL (ref 0.44–1.00)
GFR, Estimated: >60 mL/min (ref >60)
Glucose, Bld: 98 mg/dL (ref 70–99)
Phosphorus: 3.4 mg/dL (ref 2.5–4.6)
Potassium: 5.1 mmol/L (ref 3.5–5.1)
Sodium: 136 mmol/L (ref 135–145)

## 2021-07-12 LAB — MAGNESIUM: Magnesium: 2.1 mg/dL (ref 1.7–2.4)

## 2021-07-12 LAB — GLUCOSE, CAPILLARY: Glucose-Capillary: 177 mg/dL — ABNORMAL HIGH (ref 70–99)

## 2021-07-12 LAB — HUMAN PARVOVIRUS DNA DETECTION BY PCR: Parvovirus B19, PCR: NEGATIVE

## 2021-07-12 NOTE — Progress Notes (Signed)
[facility]  Date of Admission:  07/08/2021   Total days of inpatient antibiotics 2  Active Problems:   Cellulitis          Assessment: 75 year old female with diabetes, gout on probenecid, osteoarthritis.  Admitted for left foot and right hand cellulitis.  There was concern for deep tissue infection of the right hand.  MRI on the right hand showed tenosynovitis.  Patient was started on ceftriaxone.  ID consulted for antibiotic recommendations.   #Left lower leg and right hand erythema/arthralgia #Right hand tenosynovitis #Leukocytosis #Hx of gout and osteoarthritis -Uric acid wnl on 11/14 -As there are two concurrent sites of erythema, I don't think this is c/w cellulitis. Pt is followed by Rheumatology last visit was in May, 2022 for gout and osteoarthritis. Her RF was mildly elevated in the past.  ANA was positive with reflex SSA and RNP positive. Pt started on prednisone on 11/17, now left foot and right hand erythema has almost resolved - Infectious work up: Lyme panel, Parvo virus PCR, HIV, HCV, Hep c ab and HepB sAg, ASO titer, blood Cx have been negative -MRI left  foot showed subcutaneous edema on dorsum of foot could be seen in the setting of cellulitis,  Recommendations:  -Continue steroids -Needs close Rheumatology follow-up on discharge. -Pt is not able to maneuver her right hand. Needs ortho consult.  -D/C ceftriaxone -Start cefadroxil 500mg  PO bid and Continue doxycyline to complete 7 days of antibiotics(end date 11/22) for possible(unlikely) cellulitis -Follow blood Cx from 11/16 Microbiology:   Antibiotics: 11/16 CTX -p 11/17 doxy-p Cultures: 11/16 Blood Cx  Subjective: Pt is resting in bed. She reports her foot heels much better. She is still unable to move her hand.   Review of Systems: Review of Systems  All other systems reviewed and are negative.   Scheduled Meds: . colchicine  0.6 mg Oral Daily  . diclofenac  Sodium  4 g Topical QID  . diphenhydrAMINE  25 mg Oral Q6H  . doxycycline  100 mg Oral Q12H  . enoxaparin (LOVENOX) injection  40 mg Subcutaneous Q24H  . gabapentin  200 mg Oral QHS  . insulin aspart  0-5 Units Subcutaneous QHS  . insulin aspart  0-9 Units Subcutaneous TID WC  . levothyroxine  75 mcg Oral Daily  . losartan  100 mg Oral Daily  . metFORMIN  500 mg Oral BID WC  . metoprolol tartrate  25 mg Oral BID  . naproxen  500 mg Oral BID WC  . predniSONE  30 mg Oral Q breakfast  . rosuvastatin  10 mg Oral Daily   Continuous Infusions: . cefTRIAXone (ROCEPHIN)  IV 1 g (07/12/21 1746)   PRN Meds:.acetaminophen **OR** acetaminophen, LORazepam, oxyCODONE No Known Allergies  OBJECTIVE: Vitals:   07/11/21 1959 07/12/21 0409 07/12/21 0410 07/12/21 0816  BP: 117/76 (!) 176/82 (!) 176/82 (!) 159/84  Pulse: 76 60 62 72  Resp: 18 18 18 17   Temp: (!) 97.3 F (36.3 C) (!) 97.3 F (36.3 C) (!) 97.3 F (36.3 C) 97.8 F (36.6 C)  TempSrc: Oral Oral Oral Oral  SpO2: 99% 99% 100% 100%  Weight:      Height:       Body mass index is 29.29 kg/m.  Physical Exam Constitutional:      Appearance: Normal appearance.  HENT:     Head: Normocephalic and atraumatic.     Right Ear: Tympanic membrane normal.  Left Ear: Tympanic membrane normal.     Nose: Nose normal.     Mouth/Throat:     Mouth: Mucous membranes are moist.  Eyes:     Extraocular Movements: Extraocular movements intact.     Conjunctiva/sclera: Conjunctivae normal.     Pupils: Pupils are equal, round, and reactive to light.  Cardiovascular:     Rate and Rhythm: Normal rate and regular rhythm.     Heart sounds: No murmur heard.   No friction rub. No gallop.  Pulmonary:     Effort: Pulmonary effort is normal.     Breath sounds: Normal breath sounds.  Abdominal:     General: Abdomen is flat.     Palpations: Abdomen is soft.  Skin:    General: Skin is warm and dry.     Comments: Left foot edema/erythema  significantly improved, Right hand erythema improved. She is unable to move her fingers.   Neurological:     General: No focal deficit present.     Mental Status: She is alert and oriented to person, place, and time.  Psychiatric:        Mood and Affect: Mood normal.      Lab Results Lab Results  Component Value Date   WBC 13.3 (H) 07/12/2021   HGB 11.2 (L) 07/12/2021   HCT 34.8 (L) 07/12/2021   MCV 93.5 07/12/2021   PLT 476 (H) 07/12/2021    Lab Results  Component Value Date   CREATININE 0.61 07/12/2021   BUN 22 07/12/2021   NA 136 07/12/2021   K 5.1 07/12/2021   CL 102 07/12/2021   CO2 27 07/12/2021    Lab Results  Component Value Date   ALT 15 05/05/2021   AST 18 05/05/2021   ALKPHOS 75 05/05/2021   BILITOT 0.4 05/05/2021        Danelle Earthly, MD [facility] 07/12/2021, 7:21 PM

## 2021-07-12 NOTE — Progress Notes (Signed)
PROGRESS NOTE    Isabella Dixon  KWI:[id number] DOB: [birth year] DOA: 07/08/2021 PCP: Horald Pollen, MD     Brief Narrative:  Isabella Dixon is a 75 year old F with PMH of gout, IDDM-2 with neuropathy, HTN and HLD presenting with pain, swelling and erythema in right forearm/hand and left lower leg and foot.  In ED, slightly tachycardic with mild leukocytosis.  X-ray of right hand and left lower extremity shows soft tissue swelling and severe degenerative arthritis.  Blood cultures obtained.  Started on IV ceftriaxone and admitted.  Infectious disease consulted.  MRI of the right hand ordered.   MRI hand concerning for tenosynovitis and inflammatory arthropathy.  Doxycycline added.  Also started on prednisone for possible gout.  MRI left foot suggesting cellulitis/osteoarthritis.  [facility] consulted for right arm tenosynovitis.  ID following.  New events last 24 hours / Subjective: Patient states that her left foot is much better with improved erythema and improved pain. Her right wrist and hand however continues to be painful with limited range of motion of her right wrist and fingers, swelling over dorsal surface of right wrist.   Assessment & Plan:   Active Problems:   Cellulitis   Left lower extremity/right upper extremity cellulitis/arthralgia/right hand tenosynovitis: -X-ray suggest cellulitis with arthritis.  MRI of right hand concerning for tenosynovitis and possible inflammatory arthropathy.  MRI of left foot suggestive of cellulitis and osteoarthritis.  Patient has history of gout.  Uric acid 6.2.  Mild leukocytosis but no fever.  CRP and ESR markedly elevated concerning for inflammatory process.  Blood cultures NGTD.  Lyme antibody negative.  AntidsDNA negative.  SSA (Ro) elevated.  Seems to be improving with antibiotic and prednisone.  -ID following  -[facility] consulted for right hand tenosynovitis 11/19 -Continue CTX and doxycycline -Continue prednisone  and colchicine -Added naproxen, Voltaren gel and as needed oxycodone   Controlled IDDM-2 with neuropathy and hyperlipidemia: A1c 5.6%.  On metformin 500 mg.  CBG within normal range. -Continue CBG monitoring and SSI while on steroid.    Essential hypertension -Continue metoprolol and losartan.   Hypothyroidism -Continue home Synthroid.  HLD -Continue crestor.    DVT prophylaxis:  enoxaparin (LOVENOX) injection 40 mg Start: 07/08/21 2200  Code Status: Full Family Communication: Son at bedside Disposition Plan:  Status is: Inpatient  Remains inpatient appropriate because: Continued work up and consult pending    Antimicrobials:  Anti-infectives (From admission, onward)    Start     Dose/Rate Route Frequency Ordered Stop   07/09/21 1730  cefTRIAXone (ROCEPHIN) 1 g in sodium chloride 0.9 % 100 mL IVPB        1 g 200 mL/hr over 30 Minutes Intravenous Every 24 hours 07/09/21 1641 07/14/21 1729   07/09/21 1515  doxycycline (VIBRA-TABS) tablet 100 mg        100 mg Oral Every 12 hours 07/09/21 1426     07/09/21 1000  cefTRIAXone (ROCEPHIN) 2 g in sodium chloride 0.9 % 100 mL IVPB  Status:  Discontinued        2 g 200 mL/hr over 30 Minutes Intravenous Every 24 hours 07/08/21 1806 07/09/21 1015   07/08/21 1700  cefTRIAXone (ROCEPHIN) 1 g in sodium chloride 0.9 % 100 mL IVPB        1 g 200 mL/hr over 30 Minutes Intravenous  Once 07/08/21 1646 07/08/21 1941        Objective: Vitals:   07/11/21 1959 07/12/21 0409 07/12/21 0410 07/12/21 0816  BP: 117/76 (!) 176/82 Marland Kitchen)  176/82 (!) 159/84  Pulse: 76 60 62 72  Resp: '18 18 18 17  ' Temp: (!) 97.3 F (36.3 C) (!) 97.3 F (36.3 C) (!) 97.3 F (36.3 C) 97.8 F (36.6 C)  TempSrc: Oral Oral Oral Oral  SpO2: 99% 99% 100% 100%  Weight:      Height:        Intake/Output Summary (Last 24 hours) at 07/12/2021 1436 Last data filed at 07/12/2021 1100 Gross per 24 hour  Intake 480 ml  Output 3100 ml  Net -2620 ml   Filed Weights    07/08/21 2004  Weight: 75 kg    Examination:  General exam: Appears anxious and tearful  Respiratory system: Clear to auscultation. Respiratory effort normal. No respiratory distress. No conversational dyspnea.  Cardiovascular system: S1 & S2 heard, RRR. No murmurs. No pedal edema. Gastrointestinal system: Abdomen is nondistended, soft and nontender. Normal bowel sounds heard. Central nervous system: Alert and oriented. No focal neurological deficits. Speech clear.  Extremities: minimal erythema of left foot and ankle, no swelling. +mild erythema of right wrist and swelling  Psychiatry: Judgement and insight appear normal. Mood & affect appropriate.   Data Reviewed: I have personally reviewed following labs and imaging studies  CBC: Recent Labs  Lab 07/08/21 1439 07/09/21 0101 07/10/21 1024 07/11/21 1126 07/12/21 0129  WBC 12.6* 13.6* 13.0* 12.7* 13.3*  NEUTROABS 9.1*  --  11.0*  --   --   HGB 11.8* 10.9* 12.3 11.7* 11.2*  HCT 35.2* 32.3* 38.4 36.9 34.8*  MCV 90.5 90.0 93.0 92.9 93.5  PLT 405* 376 413* 478* 751*   Basic Metabolic Panel: Recent Labs  Lab 07/08/21 1439 07/08/21 2107 07/09/21 0101 07/10/21 1024 07/11/21 1126 07/12/21 0129  NA 129*  --  128* 134* 137 136  K 3.4*  --  4.6 4.6 4.8 5.1  CL 88*  --  91* 100 104 102  CO2 26  --  '25 23 23 27  ' GLUCOSE 119*  --  117* 131* 144* 98  BUN 46*  --  48* '23 20 22  ' CREATININE 0.99  --  1.00 0.73 0.71 0.61  CALCIUM 9.2  --  8.8* 9.2 9.2 8.8*  MG  --  1.9  --  1.4* 1.2* 2.1  PHOS  --   --   --  2.0* 2.2* 3.4   GFR: Estimated Creatinine Clearance: 59.8 mL/min (by C-G formula based on SCr of 0.61 mg/dL). Liver Function Tests: Recent Labs  Lab 07/10/21 1024 07/11/21 1126 07/12/21 0129  ALBUMIN 2.0* 2.0* 2.0*   No results for input(s): LIPASE, AMYLASE in the last 168 hours. No results for input(s): AMMONIA in the last 168 hours. Coagulation Profile: No results for input(s): INR, PROTIME in the last 168  hours. Cardiac Enzymes: No results for input(s): CKTOTAL, CKMB, CKMBINDEX, TROPONINI in the last 168 hours. BNP (last 3 results) No results for input(s): PROBNP in the last 8760 hours. HbA1C: No results for input(s): HGBA1C in the last 72 hours. CBG: Recent Labs  Lab 07/10/21 2045 07/11/21 0814 07/11/21 1147 07/11/21 1612 07/11/21 1955  GLUCAP 150* 92 140* 158* 170*   Lipid Profile: No results for input(s): CHOL, HDL, LDLCALC, TRIG, CHOLHDL, LDLDIRECT in the last 72 hours. Thyroid Function Tests: No results for input(s): TSH, T4TOTAL, FREET4, T3FREE, THYROIDAB in the last 72 hours. Anemia Panel: No results for input(s): VITAMINB12, FOLATE, FERRITIN, TIBC, IRON, RETICCTPCT in the last 72 hours. Sepsis Labs: Recent Labs  Lab 07/08/21 1439 07/08/21 1843  LATICACIDVEN 1.3 1.8    Recent Results (from the past 240 hour(s))  Resp Panel by RT-PCR (Flu A&B, Covid) Nasopharyngeal Swab     Status: None   Collection Time: 07/08/21  4:47 PM   Specimen: Nasopharyngeal Swab; Nasopharyngeal(NP) swabs in vial transport medium  Result Value Ref Range Status   SARS Coronavirus 2 by RT PCR NEGATIVE NEGATIVE Final    Comment: (NOTE) SARS-CoV-2 target nucleic acids are NOT DETECTED.  The SARS-CoV-2 RNA is generally detectable in upper respiratory specimens during the acute phase of infection. The lowest concentration of SARS-CoV-2 viral copies this assay can detect is 138 copies/mL. A negative result does not preclude SARS-Cov-2 infection and should not be used as the sole basis for treatment or other patient management decisions. A negative result may occur with  improper specimen collection/handling, submission of specimen other than nasopharyngeal swab, presence of viral mutation(s) within the areas targeted by this assay, and inadequate number of viral copies(<138 copies/mL). A negative result must be combined with clinical observations, patient history, and  epidemiological information. The expected result is Negative.  Fact Sheet for Patients:  EntrepreneurPulse.com.au  Fact Sheet for Healthcare Providers:  IncredibleEmployment.be  This test is no t yet approved or cleared by the [place] FDA and  has been authorized for detection and/or diagnosis of SARS-CoV-2 by FDA under an Emergency Use Authorization (EUA). This EUA will remain  in effect (meaning this test can be used) for the duration of the COVID-19 declaration under Section 564(b)(1) of the Act, 21 U.S.C.section 360bbb-3(b)(1), unless the authorization is terminated  or revoked sooner.       Influenza A by PCR NEGATIVE NEGATIVE Final   Influenza B by PCR NEGATIVE NEGATIVE Final    Comment: (NOTE) The Xpert Xpress SARS-CoV-2/FLU/RSV plus assay is intended as an aid in the diagnosis of influenza from Nasopharyngeal swab specimens and should not be used as a sole basis for treatment. Nasal washings and aspirates are unacceptable for Xpert Xpress SARS-CoV-2/FLU/RSV testing.  Fact Sheet for Patients: EntrepreneurPulse.com.au  Fact Sheet for Healthcare Providers: IncredibleEmployment.be  This test is not yet approved or cleared by the [place] FDA and has been authorized for detection and/or diagnosis of SARS-CoV-2 by FDA under an Emergency Use Authorization (EUA). This EUA will remain in effect (meaning this test can be used) for the duration of the COVID-19 declaration under Section 564(b)(1) of the Act, 21 U.S.C. section 360bbb-3(b)(1), unless the authorization is terminated or revoked.  Performed at [facility], [place] [address] [place], [place] [address]   Blood culture (routine x 2)     Status: None (Preliminary result)   Collection Time: 07/08/21  6:43 PM   Specimen: BLOOD LEFT ARM  Result Value Ref Range Status   Specimen Description BLOOD LEFT ARM  Final   Special Requests    Final    BOTTLES DRAWN AEROBIC AND ANAEROBIC Blood Culture adequate volume   Culture   Final    NO GROWTH 3 DAYS Performed at [facility], [place] [address] [place], [place] [address]    Report Status PENDING  Incomplete  Blood culture (routine x 2)     Status: None (Preliminary result)   Collection Time: 07/08/21  8:54 PM   Specimen: BLOOD RIGHT ARM  Result Value Ref Range Status   Specimen Description BLOOD RIGHT ARM  Final   Special Requests   Final    BOTTLES DRAWN AEROBIC AND ANAEROBIC Blood Culture results may not be  optimal due to an inadequate volume of blood received in culture bottles   Culture   Final    NO GROWTH 3 DAYS Performed at [facility], [place] [address], [place], [place] [address]    Report Status PENDING  Incomplete      Radiology Studies: MR FOOT LEFT WO CONTRAST  Result Date: 07/11/2021 CLINICAL DATA:  Cellulitis. History of gout and diabetes with neuropathy EXAM: MRI OF THE LEFT FOOT WITHOUT CONTRAST TECHNIQUE: Multiplanar, multisequence MR imaging of the left forefoot was performed. No intravenous contrast was administered. COMPARISON:  X-ray 07/08/2021 FINDINGS: Bones/Joint/Cartilage No acute fracture or dislocation. No erosion or periostitis. No bone marrow edema or marrow replacement. Moderate degenerative changes of the forefoot most pronounced at the fourth and fifth TMT joint level and also at the interphalangeal joints of the second and third toes. Mild osteoarthritis of the first MTP joint. No significant joint effusions. Ligaments Intact Lisfranc ligament.  No collateral ligament disruption. Muscles and Tendons No acute musculotendinous abnormality.  No tenosynovitis. Soft tissues Subcutaneous edema over the dorsum of the foot. No organized fluid collection. No soft tissue ulceration is evident. IMPRESSION: 1. Subcutaneous edema over the dorsum of the foot, which could be seen in the setting of cellulitis. No organized fluid collection. 2. No  acute osseous abnormality. Specifically, no evidence of osteomyelitis of the left forefoot. 3. Degenerative changes of the forefoot most pronounced at the fourth and fifth TMT joints and IP joints of the second and third toes. Electronically Signed   By: Davina Poke D.O.   On: 07/11/2021 16:20      Scheduled Meds:  colchicine  0.6 mg Oral Daily   diclofenac Sodium  4 g Topical QID   diphenhydrAMINE  25 mg Oral Q6H   doxycycline  100 mg Oral Q12H   enoxaparin (LOVENOX) injection  40 mg Subcutaneous Q24H   gabapentin  200 mg Oral QHS   insulin aspart  0-5 Units Subcutaneous QHS   insulin aspart  0-9 Units Subcutaneous TID WC   levothyroxine  75 mcg Oral Daily   losartan  100 mg Oral Daily   metFORMIN  500 mg Oral BID WC   metoprolol tartrate  25 mg Oral BID   naproxen  500 mg Oral BID WC   predniSONE  30 mg Oral Q breakfast   rosuvastatin  10 mg Oral Daily   Continuous Infusions:  cefTRIAXone (ROCEPHIN)  IV 1 g (07/11/21 1705)     LOS: 4 days      Time spent: 30 minutes   Isabella Phi, DO [facility] 07/12/2021, 2:36 PM   Available via Epic secure chat 7am-7pm After these hours, please refer to coverage provider listed on [email or web address]

## 2021-07-13 DIAGNOSIS — L03113 Cellulitis of right upper limb: Secondary | ICD-10-CM | POA: Diagnosis not present

## 2021-07-13 LAB — GLUCOSE, CAPILLARY
Glucose-Capillary: 110 mg/dL — ABNORMAL HIGH (ref 70–99)
Glucose-Capillary: 115 mg/dL — ABNORMAL HIGH (ref 70–99)
Glucose-Capillary: 148 mg/dL — ABNORMAL HIGH (ref 70–99)
Glucose-Capillary: 89 mg/dL (ref 70–99)
Glucose-Capillary: 90 mg/dL (ref 70–99)

## 2021-07-13 LAB — CULTURE, BLOOD (ROUTINE X 2)
Culture: NO GROWTH
Culture: NO GROWTH
Special Requests: ADEQUATE

## 2021-07-13 MED ORDER — LORAZEPAM 2 MG/ML IJ SOLN: 1.0000 mg | Freq: Once | INTRAMUSCULAR | Status: DC

## 2021-07-13 MED ORDER — PREDNISONE 10 MG PO TABS
30.0000 mg | ORAL_TABLET | Freq: Every day | ORAL | Status: DC
  Administered 2021-07-14 – 2021-07-16 (×3): 30 mg via ORAL
  Filled 2021-07-13 (×3): qty 3

## 2021-07-13 MED ORDER — CEFADROXIL 500 MG PO CAPS
500.0000 mg | ORAL_CAPSULE | Freq: Two times a day (BID) | ORAL | Status: AC
  Administered 2021-07-13 – 2021-07-14 (×3): 500 mg via ORAL
  Filled 2021-07-13 (×4): qty 1

## 2021-07-13 NOTE — Progress Notes (Signed)
Orthopedic Tech Progress Note Patient Details:  Isabella Dixon 04-03-1946 [id number]  Applied loosely to patient and applied ice to help with discomfort. Is going to reach out to PA/MD for a arm sling for "comfort" patient was stating her shoulder is killing her and no-one is listening (family at bedside)  Ortho Devices Type of Ortho Device: Wrist splint Ortho Device/Splint Location: RUE Ortho Device/Splint Interventions: Ordered, Application   Post Interventions Patient Tolerated: Fair, Well Instructions Provided: Care of device  Donald Pore 07/13/2021, 11:25 AM

## 2021-07-13 NOTE — Progress Notes (Signed)
Radiology team received request to evaluate this patient for a right wrist aspiration. Dr. Ova Freshwater reviewed current imaging (MRI hand 07/09/21) and states there is scant fluid in the joint; unsure if any fluid could be aspirated. He recommends repeat imaging if the patient is still having significant pain. Dr. Alvino Chapel notified who states she will re-image because the patient is still having significant pain.   Radiology team will await those results.  Alwyn Ren, [place] [phone] 07/13/2021, 2:31 PM

## 2021-07-13 NOTE — Evaluation (Signed)
Physical Therapy Evaluation Patient Details Name: Isabella Dixon MRN: [id number] DOB: [birth year] Today's Date: 07/13/2021  History of Present Illness  Pt is a 75 y/o female admitted secondary to increased swelling and redness in R wrist and L ankle. Thought to have Left lower extremity/right upper extremity cellulitis, and right hand tenosynovitis. PMH includes gout, DM, HTN.  Clinical Impression  Pt admitted secondary to problem above with deficits below. Pt with increased pain and decreased ROM at R wrist and L ankle. Required mod A for transfers this session. Pt reporting increased dizziness and unable to take steps. Given current deficits, recommending SNF, however, pt may progress well once symptoms improve. Will continue to follow acutely and update recommendations based on pt progression.        Recommendations for follow up therapy are one component of a multi-disciplinary discharge planning process, led by the attending physician.  Recommendations may be updated based on patient status, additional functional criteria and insurance authorization.  Follow Up Recommendations Skilled nursing-short term rehab (<3 hours/day)    Assistance Recommended at Discharge Frequent or constant Supervision/Assistance  Functional Status Assessment Patient has had a recent decline in their functional status and demonstrates the ability to make significant improvements in function in a reasonable and predictable amount of time.  Equipment Recommendations  None recommended by PT    Recommendations for Other Services       Precautions / Restrictions Precautions Precautions: Fall Restrictions Weight Bearing Restrictions: No      Mobility  Bed Mobility Overal bed mobility: Needs Assistance Bed Mobility: Supine to Sit     Supine to sit: Min assist     General bed mobility comments: Min A for trunk elevation to come to sitting    Transfers Overall transfer level: Needs  assistance Equipment used: 1 person hand held assist Transfers: Sit to/from Stand Sit to Stand: Mod assist           General transfer comment: Mod A for lift assist and steadying. Pt reporting increased dizzines and increased pain in LLE so further mobility deferred. Was able to stand X2 with assist.    Ambulation/Gait                  Stairs            Wheelchair Mobility    Modified Rankin (Stroke Patients Only)       Balance Overall balance assessment: Needs assistance Sitting-balance support: No upper extremity supported;Feet supported Sitting balance-Leahy Scale: Fair     Standing balance support: Single extremity supported Standing balance-Leahy Scale: Poor Standing balance comment: Reliant on LUE support                             Pertinent Vitals/Pain Pain Assessment: Faces Faces Pain Scale: Hurts even more Pain Location: L ankle and R wrist Pain Descriptors / Indicators: Grimacing;Guarding Pain Intervention(s): Limited activity within patient's tolerance;Monitored during session;Repositioned    Home Living Family/patient expects to be discharged to:: Private residence Living Arrangements: Spouse/significant other;Children Available Help at Discharge: Family Type of Home: House Home Access: Stairs to enter Entrance Stairs-Rails: None Entrance Stairs-Number of Steps: 3   Home Layout: One level Home Equipment: Agricultural consultant (2 wheels);Rollator (4 wheels)      Prior Function Prior Level of Function : Independent/Modified Independent                     Hand Dominance  Extremity/Trunk Assessment   Upper Extremity Assessment Upper Extremity Assessment: Defer to OT evaluation (increased swelling and redness in RUE)    Lower Extremity Assessment Lower Extremity Assessment: Generalized weakness;LLE deficits/detail LLE Deficits / Details: Redness and swelling at ankle. Very limited ROM    Cervical / Trunk  Assessment Cervical / Trunk Assessment: Normal  Communication   Communication: No difficulties  Cognition Arousal/Alertness: Awake/alert Behavior During Therapy: WFL for tasks assessed/performed Overall Cognitive Status: Within Functional Limits for tasks assessed                                          General Comments General comments (skin integrity, edema, etc.): Pt's husband present during session    Exercises     Assessment/Plan    PT Assessment Patient needs continued PT services  PT Problem List Decreased strength;Decreased balance;Decreased mobility;Decreased range of motion;Decreased activity tolerance;Decreased knowledge of use of DME;Decreased knowledge of precautions;Pain       PT Treatment Interventions DME instruction;Gait training;Therapeutic activities;Functional mobility training;Balance training;Therapeutic exercise;Patient/family education;Stair training    PT Goals (Current goals can be found in the Care Plan section)  Acute Rehab PT Goals Patient Stated Goal: to go home PT Goal Formulation: With patient Time For Goal Achievement: 07/27/21 Potential to Achieve Goals: Good    Frequency Min 3X/week   Barriers to discharge        Co-evaluation               AM-PAC PT "6 Clicks" Mobility  Outcome Measure Help needed turning from your back to your side while in a flat bed without using bedrails?: A Little Help needed moving from lying on your back to sitting on the side of a flat bed without using bedrails?: A Little Help needed moving to and from a bed to a chair (including a wheelchair)?: A Lot Help needed standing up from a chair using your arms (e.g., wheelchair or bedside chair)?: A Lot Help needed to walk in hospital room?: Total Help needed climbing 3-5 steps with a railing? : Total 6 Click Score: 12    End of Session Equipment Utilized During Treatment: Gait belt Activity Tolerance: Patient tolerated treatment  well Patient left: in bed;with call bell/phone within reach;with family/visitor present (sitting EOB with MD present) Nurse Communication: Mobility status PT Visit Diagnosis: Unsteadiness on feet (R26.81);Muscle weakness (generalized) (M62.81);Difficulty in walking, not elsewhere classified (R26.2);Pain Pain - Right/Left: Right Pain - part of body: Arm (L ankle)    Time: 3009-2330 PT Time Calculation (min) (ACUTE ONLY): 21 min   Charges:   PT Evaluation $PT Eval Moderate Complexity: 1 Mod          Farley Ly, PT, DPT  Acute Rehabilitation Services  Pager: [phone] Office: [phone]   Lehman Prom 07/13/2021, 4:25 PM

## 2021-07-13 NOTE — Consult Note (Signed)
Reason for Consult:Right extensor tenosynovitis Referring Physician: Noralee Stain Time called: [id number] Time at bedside: 0924   Isabella Dixon is an 75 y.o. female.  HPI: Isabella Dixon tells of a 2w hx/o LLE and right wrist/hand pain. She notes she noticed some swelling in her left foot while raking leaves. She went to bed and when she woke up the next morning had significant leg and arm pain/swelling/redness. She was unable to get out of bed but did not seek care until she came to the ED and was admitted. She denies fevers, chills, sweats, N/V, or prior hx/o similar. She does have a hx/o gout but this is very different. She is RHD. She does note improvement since admission.  Past Medical History:  Diagnosis Date   Anemia    Arthritis    Diabetes mellitus without complication (HCC)    Gout    High cholesterol    Hypertension    Osteoporosis     Past Surgical History:  Procedure Laterality Date   ABDOMINAL HYSTERECTOMY     TUBAL LIGATION      Family History  Problem Relation Age of Onset   Heart disease Mother    Stroke Mother    Hypertension Mother    Stroke Father    Colon cancer Sister    SIDS Son    Stroke Paternal Grandmother    Cancer Paternal Grandmother    ALS Brother    Stroke Maternal Grandmother    Cancer Paternal Grandfather    Testicular cancer Son    Down syndrome Son    Heart disease Brother     Social History:  reports that she has never smoked. She has never used smokeless tobacco. She reports that she does not drink alcohol and does not use drugs.  Allergies: No Known Allergies  Medications: I have reviewed the patient's current medications.  Results for orders placed or performed during the hospital encounter of 07/08/21 (from the past 48 hour(s))  Renal function panel     Status: Abnormal   Collection Time: 07/11/21 11:26 AM  Result Value Ref Range   Sodium 137 135 - 145 mmol/L   Potassium 4.8 3.5 - 5.1 mmol/L   Chloride 104 98 - 111 mmol/L    CO2 23 22 - 32 mmol/L   Glucose, Bld 144 (H) 70 - 99 mg/dL    Comment: Glucose reference range applies only to samples taken after fasting for at least 8 hours.   BUN 20 8 - 23 mg/dL   Creatinine, Ser 3.50 0.44 - 1.00 mg/dL   Calcium 9.2 8.9 - 09.3 mg/dL   Phosphorus 2.2 (L) 2.5 - 4.6 mg/dL   Albumin 2.0 (L) 3.5 - 5.0 g/dL   GFR, Estimated >81 >82 mL/min    Comment: (NOTE) Calculated using the CKD-EPI Creatinine Equation (2021)    Anion gap 10 5 - 15    Comment: Performed at [facility], [address] [place], [place] [address]  Magnesium     Status: Abnormal   Collection Time: 07/11/21 11:26 AM  Result Value Ref Range   Magnesium 1.2 (L) 1.7 - 2.4 mg/dL    Comment: Performed at [facility], [address] [place], [place] [address]  CBC     Status: Abnormal   Collection Time: 07/11/21 11:26 AM  Result Value Ref Range   WBC 12.7 (H) 4.0 - 10.5 K/uL   RBC 3.97 3.87 - 5.11 MIL/uL   Hemoglobin 11.7 (L) 12.0 - 15.0  g/dL   HCT 49.2 01.0 - 07.1 %   MCV 92.9 80.0 - 100.0 fL   MCH 29.5 26.0 - 34.0 pg   MCHC 31.7 30.0 - 36.0 g/dL   RDW 21.9 75.8 - 83.2 %   Platelets 478 (H) 150 - 400 K/uL   nRBC 0.0 0.0 - 0.2 %    Comment: Performed at [facility], [address] [place], [place] [address]  Glucose, capillary     Status: Abnormal   Collection Time: 07/11/21 11:47 AM  Result Value Ref Range   Glucose-Capillary 140 (H) 70 - 99 mg/dL    Comment: Glucose reference range applies only to samples taken after fasting for at least 8 hours.  Glucose, capillary     Status: Abnormal   Collection Time: 07/11/21  4:12 PM  Result Value Ref Range   Glucose-Capillary 158 (H) 70 - 99 mg/dL    Comment: Glucose reference range applies only to samples taken after fasting for at least 8 hours.  Glucose, capillary     Status: Abnormal   Collection Time: 07/11/21  7:55 PM  Result Value Ref Range   Glucose-Capillary 170 (H) 70 - 99 mg/dL    Comment: Glucose reference range  applies only to samples taken after fasting for at least 8 hours.  Renal function panel     Status: Abnormal   Collection Time: 07/12/21  1:29 AM  Result Value Ref Range   Sodium 136 135 - 145 mmol/L   Potassium 5.1 3.5 - 5.1 mmol/L   Chloride 102 98 - 111 mmol/L   CO2 27 22 - 32 mmol/L   Glucose, Bld 98 70 - 99 mg/dL    Comment: Glucose reference range applies only to samples taken after fasting for at least 8 hours.   BUN 22 8 - 23 mg/dL   Creatinine, Ser 6.41 0.44 - 1.00 mg/dL   Calcium 8.8 (L) 8.9 - 10.3 mg/dL   Phosphorus 3.4 2.5 - 4.6 mg/dL   Albumin 2.0 (L) 3.5 - 5.0 g/dL   GFR, Estimated >58 >30 mL/min    Comment: (NOTE) Calculated using the CKD-EPI Creatinine Equation (2021)    Anion gap 7 5 - 15    Comment: Performed at [facility], [address] [place], [place] [address]  Magnesium     Status: None   Collection Time: 07/12/21  1:29 AM  Result Value Ref Range   Magnesium 2.1 1.7 - 2.4 mg/dL    Comment: Performed at [facility], [address] [place], [place] [address]  CBC     Status: Abnormal   Collection Time: 07/12/21  1:29 AM  Result Value Ref Range   WBC 13.3 (H) 4.0 - 10.5 K/uL   RBC 3.72 (L) 3.87 - 5.11 MIL/uL   Hemoglobin 11.2 (L) 12.0 - 15.0 g/dL   HCT 10.3 (L) 15.9 - 45.8 %   MCV 93.5 80.0 - 100.0 fL   MCH 30.1 26.0 - 34.0 pg   MCHC 32.2 30.0 - 36.0 g/dL   RDW 59.2 92.4 - 46.2 %   Platelets 476 (H) 150 - 400 K/uL   nRBC 0.0 0.0 - 0.2 %    Comment: Performed at [facility], [address], [place], [place] [address]  Glucose, capillary     Status: Abnormal   Collection Time: 07/12/21  8:49 PM  Result Value Ref Range   Glucose-Capillary 177 (H) 70 - 99 mg/dL    Comment:  Glucose reference range applies only to samples taken after fasting for at least 8 hours.  Glucose, capillary     Status: Abnormal   Collection Time: 07/13/21  7:58 AM  Result Value Ref Range   Glucose-Capillary 115 (H) 70 - 99 mg/dL    Comment: Glucose  reference range applies only to samples taken after fasting for at least 8 hours.    No results found.  Review of Systems  Constitutional:  Negative for chills, diaphoresis and fever.  HENT:  Negative for ear discharge, ear pain, hearing loss and tinnitus.   Eyes:  Negative for photophobia and pain.  Respiratory:  Negative for cough and shortness of breath.   Cardiovascular:  Negative for chest pain.  Gastrointestinal:  Negative for abdominal pain, nausea and vomiting.  Genitourinary:  Negative for dysuria, flank pain, frequency and urgency.  Musculoskeletal:  Positive for arthralgias (Right hand/wrist, left lower leg). Negative for back pain, myalgias and neck pain.  Neurological:  Negative for dizziness and headaches.  Hematological:  Does not bruise/bleed easily.  Psychiatric/Behavioral:  The patient is not nervous/anxious.   Blood pressure 113/69, pulse 64, temperature 98.2 F (36.8 C), temperature source Oral, resp. rate 18, height 5\' 3"  (1.6 m), weight 75 kg, SpO2 100 %. Physical Exam Constitutional:      General: She is not in acute distress.    Appearance: She is well-developed. She is not diaphoretic.  HENT:     Head: Normocephalic and atraumatic.  Eyes:     General: No scleral icterus.       Right eye: No discharge.        Left eye: No discharge.     Conjunctiva/sclera: Conjunctivae normal.  Cardiovascular:     Rate and Rhythm: Normal rate and regular rhythm.  Pulmonary:     Effort: Pulmonary effort is normal. No respiratory distress.  Musculoskeletal:     Cervical back: Normal range of motion.     Comments: Right shoulder, elbow, wrist, digits- no skin wounds, extensor aspect wrist/hand mod TTP, no erythema, mild edema, pain with passive extension fingers, no instability, no blocks to motion  Sens  Ax/R/M/U intact  Mot   Ax/ R/ PIN/ M/ AIN/ U grossly intact but minimal movement 2/2 pain  Rad 2+  Skin:    General: Skin is warm and dry.  Neurological:     Mental  Status: She is alert.  Psychiatric:        Mood and Affect: Mood normal.        Behavior: Behavior normal.    Assessment/Plan: Right hand extensor tenosynovitis -- Unclear etiology though infectious certainly high on the list. Given improvement over last 48h and extensor compartment involvement would not recommend surgical intervention. Will give removable volar splint for comfort, should be removed for OT. No WB or ROM restrictions. F/u with Dr. next week.    Yehuda Budd, PA-C Orthopedic Surgery [phone] 07/13/2021, 9:57 AM

## 2021-07-13 NOTE — Progress Notes (Signed)
PROGRESS NOTE    Isabella Dixon  ZSW:[id number] DOB: 12-01-1945 DOA: 07/08/2021 PCP: Horald Pollen, MD     Brief Narrative:  Isabella Dixon is a 75 year old F with PMH of gout, IDDM-2 with neuropathy, HTN and HLD presenting with pain, swelling and erythema in right forearm/hand and left lower leg and foot.  In ED, slightly tachycardic with mild leukocytosis.  X-ray of right hand and left lower extremity shows soft tissue swelling and severe degenerative arthritis.  Blood cultures obtained.  Started on IV ceftriaxone and admitted.  Infectious disease consulted.  MRI of the right hand ordered.   MRI hand concerning for tenosynovitis and inflammatory arthropathy.  Doxycycline added.  Also started on prednisone for possible gout.  MRI left foot suggesting cellulitis/osteoarthritis.  [facility] consulted for right arm tenosynovitis.  ID following.  New events last 24 hours / Subjective: Sitting in chair this morning, continues to have right hand/wrist pain and stiffness, unable to straighten right elbow, or make a fist. Erythema improving.   Assessment & Plan:   Active Problems:   Cellulitis   Left lower extremity/right upper extremity cellulitis/arthralgia/right hand tenosynovitis: -X-ray suggest cellulitis with arthritis.  MRI of right hand concerning for tenosynovitis and possible inflammatory arthropathy.  MRI of left foot suggestive of cellulitis and osteoarthritis.  Patient has history of gout.  Uric acid 6.2.  Mild leukocytosis but no fever.  CRP and ESR markedly elevated concerning for inflammatory process.  Blood cultures NGTD.  Lyme antibody negative.  AntidsDNA negative.  SSA (Ro) elevated.  Seems to be improving with antibiotic and prednisone.  -ID following  -Ortho consulted for right hand tenosynovitis, recommended splint for comfort, follow up with Dr. Greta Doom next week  -Continue cefadroxil 540m PO bid and Continue doxycyline to complete 7 days of antibiotics  (end date 11/22). Dr. SCandiss Norsealso recommended IR consultation for joint aspiration  -Continue prednisone and colchicine -Added naproxen, Voltaren gel and as needed oxycodone   Controlled IDDM-2 with neuropathy and hyperlipidemia: A1c 5.6%.  On metformin 500 mg.  CBG within normal range. -Continue CBG monitoring and SSI while on steroid.    Essential hypertension -Continue metoprolol and losartan.   Hypothyroidism -Continue home Synthroid.  HLD -Continue crestor.    DVT prophylaxis:  enoxaparin (LOVENOX) injection 40 mg Start: 07/08/21 2200  Code Status: Full Family Communication: Husband and son at bedside Disposition Plan:  Status is: Inpatient  Remains inpatient appropriate because: Continued work up and IR consult pending    Antimicrobials:  Anti-infectives (From admission, onward)    Start     Dose/Rate Route Frequency Ordered Stop   07/13/21 1730  cefadroxil (DURICEF) capsule 500 mg        500 mg Oral 2 times daily 07/13/21 0931 07/15/21 0529   07/09/21 1730  cefTRIAXone (ROCEPHIN) 1 g in sodium chloride 0.9 % 100 mL IVPB  Status:  Discontinued        1 g 200 mL/hr over 30 Minutes Intravenous Every 24 hours 07/09/21 1641 07/13/21 0931   07/09/21 1515  doxycycline (VIBRA-TABS) tablet 100 mg        100 mg Oral Every 12 hours 07/09/21 1426 07/14/21 2359   07/09/21 1000  cefTRIAXone (ROCEPHIN) 2 g in sodium chloride 0.9 % 100 mL IVPB  Status:  Discontinued        2 g 200 mL/hr over 30 Minutes Intravenous Every 24 hours 07/08/21 1806 07/09/21 1015   07/08/21 1700  cefTRIAXone (ROCEPHIN) 1 g in sodium chloride 0.9 %  100 mL IVPB        1 g 200 mL/hr over 30 Minutes Intravenous  Once 07/08/21 1646 07/08/21 1941        Objective: Vitals:   07/12/21 0816 07/12/21 2051 07/13/21 0516 07/13/21 0757  BP: (!) 159/84 132/61 137/76 113/69  Pulse: 72 72 (!) 58 64  Resp: _0 Temp: 97.8 F (36.6 C) 97.8 F (36.6 C) 98.1 F (36.7 C) 98.2 F (36.8 C)  TempSrc: Oral    Oral  SpO2: 100% 94% 100% 100%  Weight:      Height:        Intake/Output Summary (Last 24 hours) at 07/13/2021 1249 Last data filed at 07/13/2021 0910 Gross per 24 hour  Intake 720 ml  Output 1950 ml  Net -1230 ml    Filed Weights   07/08/21 2004  Weight: 75 kg    Examination: General exam: Appears calm and comfortable  Respiratory system: Clear to auscultation. Respiratory effort normal. Cardiovascular system: S1 & S2 heard, RRR. No pedal edema. Gastrointestinal system: Abdomen is nondistended, soft and nontender. Normal bowel sounds heard. Central nervous system: Alert and oriented. Non focal exam. Speech clear  Extremities: Right hand with limited ROM, mild erythema over extensor surface. Left foot with minimal erythema and no edema  Psychiatry: Judgement and insight appear stable. Mood & affect appropriate.    Data Reviewed: I have personally reviewed following labs and imaging studies  CBC: Recent Labs  Lab 07/08/21 1439 07/09/21 0101 07/10/21 1024 07/11/21 1126 07/12/21 0129  WBC 12.6* 13.6* 13.0* 12.7* 13.3*  NEUTROABS 9.1*  --  11.0*  --   --   HGB 11.8* 10.9* 12.3 11.7* 11.2*  HCT 35.2* 32.3* 38.4 36.9 34.8*  MCV 90.5 90.0 93.0 92.9 93.5  PLT 405* 376 413* 478* 476*    Basic Metabolic Panel: Recent Labs  Lab 07/08/21 1439 07/08/21 2107 07/09/21 0101 07/10/21 1024 07/11/21 1126 07/12/21 0129  NA 129*  --  128* 134* 137 136  K 3.4*  --  4.6 4.6 4.8 5.1  CL 88*  --  91* 100 104 102  CO2 26  --  _1 GLUCOSE 119*  --  117* 131* 144* 98  BUN 46*  --  48* _2 CREATININE 0.99  --  1.00 0.73 0.71 0.61  CALCIUM 9.2  --  8.8* 9.2 9.2 8.8*  MG  --  1.9  --  1.4* 1.2* 2.1  PHOS  --   --   --  2.0* 2.2* 3.4    GFR: Estimated Creatinine Clearance: 59.8 mL/min (by C-G formula based on SCr of 0.61 mg/dL). Liver Function Tests: Recent Labs  Lab 07/10/21 1024 07/11/21 1126 07/12/21 0129  ALBUMIN 2.0* 2.0* 2.0*    No results for  input(s): LIPASE, AMYLASE in the last 168 hours. No results for input(s): AMMONIA in the last 168 hours. Coagulation Profile: No results for input(s): INR, PROTIME in the last 168 hours. Cardiac Enzymes: No results for input(s): CKTOTAL, CKMB, CKMBINDEX, TROPONINI in the last 168 hours. BNP (last 3 results) No results for input(s): PROBNP in the last 8760 hours. HbA1C: No results for input(s): HGBA1C in the last 72 hours. CBG: Recent Labs  Lab 07/11/21 1612 07/11/21 1955 07/12/21 2049 07/13/21 0758 07/13/21 1150  GLUCAP 158* 170* 177* 115* 90    Lipid Profile: No results for input(s): CHOL, HDL, LDLCALC, TRIG, CHOLHDL, LDLDIRECT in the last 72 hours. Thyroid Function Tests:  No results for input(s): TSH, T4TOTAL, FREET4, T3FREE, THYROIDAB in the last 72 hours. Anemia Panel: No results for input(s): VITAMINB12, FOLATE, FERRITIN, TIBC, IRON, RETICCTPCT in the last 72 hours. Sepsis Labs: Recent Labs  Lab 07/08/21 1439 07/08/21 1843  LATICACIDVEN 1.3 1.8     Recent Results (from the past 240 hour(s))  Resp Panel by RT-PCR (Flu A&B, Covid) Nasopharyngeal Swab     Status: None   Collection Time: 07/08/21  4:47 PM   Specimen: Nasopharyngeal Swab; Nasopharyngeal(NP) swabs in vial transport medium  Result Value Ref Range Status   SARS Coronavirus 2 by RT PCR NEGATIVE NEGATIVE Final    Comment: (NOTE) SARS-CoV-2 target nucleic acids are NOT DETECTED.  The SARS-CoV-2 RNA is generally detectable in upper respiratory specimens during the acute phase of infection. The lowest concentration of SARS-CoV-2 viral copies this assay can detect is 138 copies/mL. A negative result does not preclude SARS-Cov-2 infection and should not be used as the sole basis for treatment or other patient management decisions. A negative result may occur with  improper specimen collection/handling, submission of specimen other than nasopharyngeal swab, presence of viral mutation(s) within the areas  targeted by this assay, and inadequate number of viral copies(<138 copies/mL). A negative result must be combined with clinical observations, patient history, and epidemiological information. The expected result is Negative.  Fact Sheet for Patients:  EntrepreneurPulse.com.au  Fact Sheet for Healthcare Providers:  IncredibleEmployment.be  This test is no t yet approved or cleared by the [place] FDA and  has been authorized for detection and/or diagnosis of SARS-CoV-2 by FDA under an Emergency Use Authorization (EUA). This EUA will remain  in effect (meaning this test can be used) for the duration of the COVID-19 declaration under Section 564(b)(1) of the Act, 21 U.S.C.section 360bbb-3(b)(1), unless the authorization is terminated  or revoked sooner.       Influenza A by PCR NEGATIVE NEGATIVE Final   Influenza B by PCR NEGATIVE NEGATIVE Final    Comment: (NOTE) The Xpert Xpress SARS-CoV-2/FLU/RSV plus assay is intended as an aid in the diagnosis of influenza from Nasopharyngeal swab specimens and should not be used as a sole basis for treatment. Nasal washings and aspirates are unacceptable for Xpert Xpress SARS-CoV-2/FLU/RSV testing.  Fact Sheet for Patients: EntrepreneurPulse.com.au  Fact Sheet for Healthcare Providers: IncredibleEmployment.be  This test is not yet approved or cleared by the [place] FDA and has been authorized for detection and/or diagnosis of SARS-CoV-2 by FDA under an Emergency Use Authorization (EUA). This EUA will remain in effect (meaning this test can be used) for the duration of the COVID-19 declaration under Section 564(b)(1) of the Act, 21 U.S.C. section 360bbb-3(b)(1), unless the authorization is terminated or revoked.  Performed at [facility], [place] [address] [place],  [place] [address]   Blood culture (routine x 2)     Status: None (Preliminary result)    Collection Time: 07/08/21  6:43 PM   Specimen: BLOOD LEFT ARM  Result Value Ref Range Status   Specimen Description BLOOD LEFT ARM  Final   Special Requests   Final    BOTTLES DRAWN AEROBIC AND ANAEROBIC Blood Culture adequate volume   Culture   Final    NO GROWTH 4 DAYS Performed at [facility], [place] [address] [place], [place] [address]    Report Status PENDING  Incomplete  Blood culture (routine x 2)     Status: None (Preliminary result)   Collection Time: 07/08/21  8:54  PM   Specimen: BLOOD RIGHT ARM  Result Value Ref Range Status   Specimen Description BLOOD RIGHT ARM  Final   Special Requests   Final    BOTTLES DRAWN AEROBIC AND ANAEROBIC Blood Culture results may not be optimal due to an inadequate volume of blood received in culture bottles   Culture   Final    NO GROWTH 4 DAYS Performed at [facility], [place] [address], [place], [place] [address]    Report Status PENDING  Incomplete       Radiology Studies: No results found.    Scheduled Meds:  cefadroxil  500 mg Oral BID   colchicine  0.6 mg Oral Daily   diclofenac Sodium  4 g Topical QID   diphenhydrAMINE  25 mg Oral Q6H   doxycycline  100 mg Oral Q12H   enoxaparin (LOVENOX) injection  40 mg Subcutaneous Q24H   gabapentin  200 mg Oral QHS   insulin aspart  0-5 Units Subcutaneous QHS   insulin aspart  0-9 Units Subcutaneous TID WC   levothyroxine  75 mcg Oral Daily   losartan  100 mg Oral Daily   metFORMIN  500 mg Oral BID WC   metoprolol tartrate  25 mg Oral BID   naproxen  500 mg Oral BID WC   rosuvastatin  10 mg Oral Daily   Continuous Infusions:     LOS: 5 days      Time spent: 30 minutes   Dessa Phi, DO [facility] 07/13/2021, 12:49 PM   Available via Epic secure chat 7am-7pm After these hours, please refer to coverage provider listed on [email or web address]

## 2021-07-14 DIAGNOSIS — L03113 Cellulitis of right upper limb: Secondary | ICD-10-CM | POA: Diagnosis not present

## 2021-07-14 LAB — GLUCOSE, CAPILLARY
Glucose-Capillary: 74 mg/dL (ref 70–99)
Glucose-Capillary: 115 mg/dL — ABNORMAL HIGH (ref 70–99)
Glucose-Capillary: 139 mg/dL — ABNORMAL HIGH (ref 70–99)
Glucose-Capillary: 124 mg/dL — ABNORMAL HIGH (ref 70–99)

## 2021-07-14 NOTE — TOC Initial Note (Signed)
Transition of Care [facility]) - Initial/Assessment Note    Patient Details  Name: Isabella Dixon MRN: [id number] Date of Birth: [birth year]  [facility] CM/SW Contact:    Emeterio Reeve, LCSW Phone Number: 07/14/2021, 4:03 PM  Clinical Narrative:                  CSW received SNF consult. CSW met with pt at bedside. CSW introduced self and explained role at the hospital. Pt reports that PTA she lived at home with her adult son and husband. Pt was independent with mobility and ADL's.   CSW reviewed PT/OT recommendations for SNF. Pt reports she will think about it but not sure yet. Pt gave CSW permission to fax out to facilities in the area. Pt has no preference of facility at this time. CSW gave pt medicare.gov rating list to review. CSW explained insurance auth process. Pt reports they are covid vaccinated.  CSW will continue to follow.   Expected Discharge Plan: Skilled Nursing Facility Barriers to Discharge: Insurance Authorization, Continued Medical Work up   Patient Goals and CMS Choice Patient states their goals for this hospitalization and ongoing recovery are:: to get better CMS Medicare.gov Compare Post Acute Care list provided to:: Patient Choice offered to / list presented to : Patient  Expected Discharge Plan and Services Expected Discharge Plan: [place]       Living arrangements for the past 2 months: Single Family Home                                      Prior Living Arrangements/Services Living arrangements for the past 2 months: Single Family Home Lives with:: Adult Children, Spouse Patient language and need for interpreter reviewed:: Yes Do you feel safe going back to the place where you live?: Yes      Need for Family Participation in Patient Care: Yes (Comment) Care giver support system in place?: Yes (comment)   Criminal Activity/Legal Involvement Pertinent to Current Situation/Hospitalization: No - Comment as  needed  Activities of Daily Living Home Assistive Devices/Equipment: CBG Meter, Eyeglasses ADL Screening (condition at time of admission) Patient's cognitive ability adequate to safely complete daily activities?: No Is the patient deaf or have difficulty hearing?: No Does the patient have difficulty seeing, even when wearing glasses/contacts?: No Does the patient have difficulty concentrating, remembering, or making decisions?: Yes Patient able to express need for assistance with ADLs?: Yes Does the patient have difficulty dressing or bathing?: No Independently performs ADLs?: Yes (appropriate for developmental age) Does the patient have difficulty walking or climbing stairs?: No Weakness of Legs: None Weakness of Arms/Hands: Right  Permission Sought/Granted Permission sought to share information with : Family Supports, Chartered certified accountant granted to share information with : Yes, Verbal Permission Granted     Permission granted to share info w AGENCY: SNF        Emotional Assessment Appearance:: Appears stated age Attitude/Demeanor/Rapport: Engaged Affect (typically observed): Appropriate Orientation: : Oriented to Self, Oriented to Place, Oriented to  Time, Oriented to Situation Alcohol / Substance Use: Not Applicable Psych Involvement: No (comment)  Admission diagnosis:  Cellulitis [L03.90] Cellulitis of left lower extremity [L03.116] Patient Active Problem List   Diagnosis Date Noted   Cellulitis 07/08/2021   Osteoporosis 05/05/2021   Ectopic pregnancy 05/05/2021   Primary osteoarthritis of both hands 06/21/2019   Primary osteoarthritis of both feet  06/21/2019   Primary osteoarthritis of both knees 06/21/2019   History of sciatica 09/06/2017   Obesity, diabetes, and hypertension syndrome ([place]) 09/06/2017   History of gout 09/06/2017   Diabetes mellitus type 2 in obese ([place]) 05/13/2015   Hypothyroid 07/25/2012   Gouty arthritis 05/02/2012    Osteoarthritis 05/02/2012   Hypertension associated with diabetes ([place]) 05/02/2012   Hyperlipidemia 05/02/2012   PCP:  Horald Pollen, MD Pharmacy:   [facility] - [address] [place] [address] Phone: [phone] Fax: [phone]  [id number] [id number]: [phone]: [phone] [facility] [place] [facility] [address] [id number]: [phone]: [phone]    Social Determinants of Health (SDOH) Interventions    Readmission Risk Interventions No flowsheet data found.  MEmeterio Reeve LCSW Clinical Social Worker

## 2021-07-14 NOTE — [place] FL2 (Signed)
[place] MEDICAID FL2 LEVEL OF CARE SCREENING TOOL     IDENTIFICATION  Patient Name: Isabella Dixon Birthdate: 1946/03/27 Sex: female Admission Date (Current Location): 07/08/2021  [facility] and [place] Number:  Producer, television/film/video and Address:  [facility] [place], [address] [place], [place] [address]      Provider Number: [id number]  Attending Physician Name and Address:  Noralee Stain, DO  Relative Name and Phone Number:       Current Level of Care: Hospital Recommended Level of Care: Skilled Nursing Facility Prior Approval Number:    Date Approved/Denied:   PASRR Number: [id number]  Discharge Plan: SNF    Current Diagnoses: Patient Active Problem List   Diagnosis Date Noted   Cellulitis 07/08/2021   Osteoporosis 05/05/2021   Ectopic pregnancy 05/05/2021   Primary osteoarthritis of both hands 06/21/2019   Primary osteoarthritis of both feet 06/21/2019   Primary osteoarthritis of both knees 06/21/2019   History of sciatica 09/06/2017   Obesity, diabetes, and hypertension syndrome ([facility]) 09/06/2017   History of gout 09/06/2017   Diabetes mellitus type 2 in obese ([facility]) 05/13/2015   Hypothyroid 07/25/2012   Gouty arthritis 05/02/2012   Osteoarthritis 05/02/2012   Hypertension associated with diabetes ([facility]) 05/02/2012   Hyperlipidemia 05/02/2012    Orientation RESPIRATION BLADDER Height & Weight     Self, Time, Situation, Place  Normal Incontinent Weight: 165 lb 5.5 oz (75 kg) Height:  5\' 3"  (160 cm)  BEHAVIORAL SYMPTOMS/MOOD NEUROLOGICAL BOWEL NUTRITION STATUS      Incontinent Diet  AMBULATORY STATUS COMMUNICATION OF NEEDS Skin   Limited Assist Verbally Normal                       Personal Care Assistance Level of Assistance  Bathing, Feeding, Dressing Bathing Assistance: Limited assistance Feeding assistance: Independent Dressing Assistance: Limited assistance     Functional Limitations Info  Hearing, Speech,  Sight Sight Info: Adequate Hearing Info: Adequate Speech Info: Adequate    SPECIAL CARE FACTORS FREQUENCY  PT (By licensed PT), OT (By licensed OT)     PT Frequency: 5x a week OT Frequency: 5x a week            Contractures Contractures Info: Not present    Additional Factors Info  Code Status, Allergies Code Status Info: Full Allergies Info: NKA           Current Medications (07/14/2021):  This is the current hospital active medication list Current Facility-Administered Medications  Medication Dose Route Frequency Provider Last Rate Last Admin   acetaminophen (TYLENOL) tablet 650 mg  650 mg Oral Q6H PRN 07/16/2021 T, MD   650 mg at 07/11/21 0507   Or   acetaminophen (TYLENOL) suppository 650 mg  650 mg Rectal Q6H PRN 07/13/21 T, MD       cefadroxil (DURICEF) capsule 500 mg  500 mg Oral BID Mikey College, MD   500 mg at 07/14/21 07/16/21   colchicine tablet 0.6 mg  0.6 mg Oral Daily 6720 T, MD   0.6 mg at 07/14/21 1136   diclofenac Sodium (VOLTAREN) 1 % topical gel 4 g  4 g Topical QID 07/16/21 T, MD   4 g at 07/14/21 1137   diphenhydrAMINE (BENADRYL) capsule 25 mg  25 mg Oral Q6H 07/16/21 T, MD   25 mg at 07/14/21 1134   doxycycline (VIBRA-TABS) tablet 100 mg  100 mg Oral Q12H 07/16/21, MD  100 mg at 07/14/21 1136   enoxaparin (LOVENOX) injection 40 mg  40 mg Subcutaneous Q24H Mikey College T, MD   40 mg at 07/13/21 2234   gabapentin (NEURONTIN) capsule 200 mg  200 mg Oral QHS Mikey College T, MD   200 mg at 07/08/21 2232   insulin aspart (novoLOG) injection 0-5 Units  0-5 Units Subcutaneous QHS Gonfa, Taye T, MD       insulin aspart (novoLOG) injection 0-9 Units  0-9 Units Subcutaneous TID WC Almon Hercules, MD   1 Units at 07/14/21 1249   levothyroxine (SYNTHROID) tablet 75 mcg  75 mcg Oral Daily Mikey College T, MD   75 mcg at 07/14/21 0522   losartan (COZAAR) tablet 100 mg  100 mg Oral Daily Mikey College T, MD   100 mg at 07/14/21 1136   metFORMIN  (GLUCOPHAGE) tablet 500 mg  500 mg Oral BID WC Mikey College T, MD   500 mg at 07/14/21 0748   metoprolol tartrate (LOPRESSOR) tablet 25 mg  25 mg Oral BID Mikey College T, MD   25 mg at 07/14/21 1135   naproxen (NAPROSYN) tablet 500 mg  500 mg Oral BID WC Candelaria Stagers T, MD   500 mg at 07/14/21 6962   oxyCODONE (Oxy IR/ROXICODONE) immediate release tablet 5 mg  5 mg Oral Q8H PRN Candelaria Stagers T, MD       predniSONE (DELTASONE) tablet 30 mg  30 mg Oral Q breakfast Noralee Stain, DO   30 mg at 07/14/21 0748   rosuvastatin (CRESTOR) tablet 10 mg  10 mg Oral Daily Mikey College T, MD   10 mg at 07/14/21 1135     Discharge Medications: Please see discharge summary for a list of discharge medications.  Relevant Imaging Results:  Relevant Lab Results:   Additional Information SSN [id number]  Jimmy Picket, LCSW

## 2021-07-14 NOTE — Progress Notes (Signed)
Physical Therapy Treatment Patient Details Name: Isabella Dixon MRN: [id number] DOB: 04/29/1946 Today's Date: 07/14/2021   History of Present Illness Pt is a 75 y/o female admitted secondary to increased swelling and redness in R wrist and L ankle. Thought to have Left lower extremity/right upper extremity cellulitis, and right hand tenosynovitis. PMH includes gout, DM, HTN.    PT Comments    Pt was seen with family in attendance to work first on standing control, then to work on balance with gait and finally sit to stand from bed with min guard assist.  Pt reports no increase on L foot pain with standing and walking surprisingly and is motivated to recover her independence given PLOF.  Follow for acute PT goals, focusing on increasing gait control.  Have messaged MD to ask for platform on RUE and concern about the coloration of R wrist.    Recommendations for follow up therapy are one component of a multi-disciplinary discharge planning process, led by the attending physician.  Recommendations may be updated based on patient status, additional functional criteria and insurance authorization.  Follow Up Recommendations  Skilled nursing-short term rehab (<3 hours/day)     Assistance Recommended at Discharge Frequent or constant Supervision/Assistance  Equipment Recommendations  None recommended by PT    Recommendations for Other Services       Precautions / Restrictions Precautions Precautions: Fall Restrictions Weight Bearing Restrictions: No     Mobility  Bed Mobility Overal bed mobility: Needs Assistance Bed Mobility: Supine to Sit;Sit to Supine     Supine to sit: Min assist Sit to supine: Min guard;Min assist        Transfers Overall transfer level: Needs assistance   Transfers: Sit to/from Stand Sit to Stand: Mod assist;Min assist           General transfer comment: Pt did standing with one person on each UE, PT supporting her elbow and husband holding  L hand    Ambulation/Gait Ambulation/Gait assistance: Min assist;+2 physical assistance;+2 safety/equipment Gait Distance (Feet): 10 Feet Assistive device: 2 person hand held assist Gait Pattern/deviations: Step-to pattern;Shuffle;Decreased stride length Gait velocity: reduced Gait velocity interpretation: <1.8 ft/sec, indicate of risk for recurrent falls Pre-gait activities: Practiced standing with help and with pt doing more of the work General Gait Details: encouraged upright standing, and pt was able to side step with bed to support.  Pt was oddly rotating in standing without two person support   Stairs             Wheelchair Mobility    Modified Rankin (Stroke Patients Only)       Balance Overall balance assessment: Needs assistance Sitting-balance support: Feet supported Sitting balance-Leahy Scale: Good     Standing balance support: Bilateral upper extremity supported Standing balance-Leahy Scale: Poor                              Cognition Arousal/Alertness: Awake/alert Behavior During Therapy: WFL for tasks assessed/performed Overall Cognitive Status: Within Functional Limits for tasks assessed                                          Exercises      General Comments General comments (skin integrity, edema, etc.): pt used R elbow for support to stand, and L hand with good control of balance, no increase  in L foot pain to walk short trip      Pertinent Vitals/Pain Pain Assessment: Faces Faces Pain Scale: Hurts little more Pain Location: L ankle and R wrist Pain Descriptors / Indicators: Grimacing;Guarding Pain Intervention(s): Limited activity within patient's tolerance;Monitored during session;Premedicated before session;Repositioned    Home Living                          Prior Function            PT Goals (current goals can now be found in the care plan section) Progress towards PT goals: Progressing  toward goals    Frequency    Min 3X/week      PT Plan Current plan remains appropriate    Co-evaluation              AM-PAC PT "6 Clicks" Mobility   Outcome Measure  Help needed turning from your back to your side while in a flat bed without using bedrails?: A Little Help needed moving from lying on your back to sitting on the side of a flat bed without using bedrails?: A Little Help needed moving to and from a bed to a chair (including a wheelchair)?: A Lot Help needed standing up from a chair using your arms (e.g., wheelchair or bedside chair)?: A Lot Help needed to walk in hospital room?: A Lot Help needed climbing 3-5 steps with a railing? : Total 6 Click Score: 13    End of Session Equipment Utilized During Treatment: Gait belt Activity Tolerance: Patient tolerated treatment well Patient left: in bed;with call bell/phone within reach;with family/visitor present Nurse Communication: Mobility status PT Visit Diagnosis: Unsteadiness on feet (R26.81);Muscle weakness (generalized) (M62.81);Pain Pain - Right/Left: Left Pain - part of body: Ankle and joints of foot     Time: 1535-1617 PT Time Calculation (min) (ACUTE ONLY): 42 min  Charges:  $Gait Training: 8-22 mins $Therapeutic Activity: 23-37 mins              Ivar Drape 07/14/2021, 8:10 PM  Samul Dada, PT PhD Acute Rehab Dept. Number: [facility] [id number] and [facility] [phone]

## 2021-07-14 NOTE — Progress Notes (Signed)
PROGRESS NOTE    Isabella Dixon  FWY:[id number] DOB: [birth year] DOA: 07/08/2021 PCP: Horald Pollen, MD     Brief Narrative:  Isabella Dixon is a 75 year old F with PMH of gout, IDDM-2 with neuropathy, HTN and HLD presenting with pain, swelling and erythema in right forearm/hand and left lower leg and foot.  In ED, slightly tachycardic with mild leukocytosis.  X-ray of right hand and left lower extremity shows soft tissue swelling and severe degenerative arthritis.  Blood cultures obtained.  Started on IV ceftriaxone and admitted.  Infectious disease consulted.  MRI of the right hand ordered.   MRI hand concerning for tenosynovitis and inflammatory arthropathy.  Doxycycline added.  Also started on prednisone for possible gout.  MRI left foot suggesting cellulitis/osteoarthritis.  [facility] consulted for right arm tenosynovitis.  ID consulted as well.  New events last 24 hours / Subjective: Patient work with OT this morning.  States that she is able to move her right hand a little bit better although it has been a very slow improvement.  Erythema has nearly resolved.  Assessment & Plan:   Active Problems:   Cellulitis   Left lower extremity/right upper extremity cellulitis/arthralgia/right hand tenosynovitis: -X-ray suggest cellulitis with arthritis.  MRI of right hand concerning for tenosynovitis and possible inflammatory arthropathy.  MRI of left foot suggestive of cellulitis and osteoarthritis.  Patient has history of gout.  Uric acid 6.2.  Mild leukocytosis but no fever.  CRP and ESR markedly elevated concerning for inflammatory process.  Blood cultures NGTD.  Lyme antibody negative.  AntidsDNA negative.  SSA (Ro) elevated.  Seems to be improving with antibiotic and prednisone.  -ID consulted, signed off 11/22.  Did not feel that this was infectious in nature.  Last day of antibiotic is 11/22 -Ortho consulted for right hand tenosynovitis, recommended splint for  comfort, follow up with Dr. Greta Doom next week.  Discussed with Dr. Greta Doom over the phone.  He did not feel repeat MRI would be beneficial.  He recommended continued OT and steroids -Continue prednisone and colchicine -Added naproxen, Voltaren gel and as needed oxycodone   Controlled IDDM-2 with neuropathy and hyperlipidemia: A1c 5.6%.  On metformin 500 mg.  CBG within normal range. -Continue CBG monitoring and SSI while on steroid.    Essential hypertension -Continue metoprolol and losartan.   Hypothyroidism -Continue home Synthroid.  HLD -Continue crestor.    DVT prophylaxis:  enoxaparin (LOVENOX) injection 40 mg Start: 07/08/21 2200  Code Status: Full Family Communication: Husband and son at bedside Disposition Plan:  Status is: Inpatient  Remains inpatient appropriate because: Continued work up and IR consult pending    Antimicrobials:  Anti-infectives (From admission, onward)    Start     Dose/Rate Route Frequency Ordered Stop   07/13/21 1730  cefadroxil (DURICEF) capsule 500 mg        500 mg Oral 2 times daily 07/13/21 0931 07/15/21 0529   07/09/21 1730  cefTRIAXone (ROCEPHIN) 1 g in sodium chloride 0.9 % 100 mL IVPB  Status:  Discontinued        1 g 200 mL/hr over 30 Minutes Intravenous Every 24 hours 07/09/21 1641 07/13/21 0931   07/09/21 1515  doxycycline (VIBRA-TABS) tablet 100 mg        100 mg Oral Every 12 hours 07/09/21 1426 07/14/21 2359   07/09/21 1000  cefTRIAXone (ROCEPHIN) 2 g in sodium chloride 0.9 % 100 mL IVPB  Status:  Discontinued        2  g 200 mL/hr over 30 Minutes Intravenous Every 24 hours 07/08/21 1806 07/09/21 1015   07/08/21 1700  cefTRIAXone (ROCEPHIN) 1 g in sodium chloride 0.9 % 100 mL IVPB        1 g 200 mL/hr over 30 Minutes Intravenous  Once 07/08/21 1646 07/08/21 1941        Objective: Vitals:   07/13/21 1617 07/13/21 2327 07/14/21 0409 07/14/21 0804  BP: 125/68 (!) 144/77 (!) 151/82 137/66  Pulse: 72 61 (!) 57 60  Resp:   18  18  Temp: 97.9 F (36.6 C) 97.8 F (36.6 C)  97.6 F (36.4 C)  TempSrc: Oral Oral    SpO2: 97% 98% 96% 97%  Weight:      Height:        Intake/Output Summary (Last 24 hours) at 07/14/2021 1316 Last data filed at 07/14/2021 1205 Gross per 24 hour  Intake 920 ml  Output 2350 ml  Net -1430 ml    Filed Weights   07/08/21 2004  Weight: 75 kg    Examination: General exam: Appears calm and comfortable  Respiratory system: Clear to auscultation. Respiratory effort normal. Cardiovascular system: S1 & S2 heard, RRR. No pedal edema. Gastrointestinal system: Abdomen is nondistended, soft and nontender. Normal bowel sounds heard. Central nervous system: Alert and oriented. Non focal exam. Speech clear  Extremities: Right hand/wrist erythema has resolved.  Range of motion is slowly improving.  Left foot has minimal residual erythema and no edema Psychiatry: Judgement and insight appear stable. Mood & affect appropriate.     Data Reviewed: I have personally reviewed following labs and imaging studies  CBC: Recent Labs  Lab 07/08/21 1439 07/09/21 0101 07/10/21 1024 07/11/21 1126 07/12/21 0129  WBC 12.6* 13.6* 13.0* 12.7* 13.3*  NEUTROABS 9.1*  --  11.0*  --   --   HGB 11.8* 10.9* 12.3 11.7* 11.2*  HCT 35.2* 32.3* 38.4 36.9 34.8*  MCV 90.5 90.0 93.0 92.9 93.5  PLT 405* 376 413* 478* 476*    Basic Metabolic Panel: Recent Labs  Lab 07/08/21 1439 07/08/21 2107 07/09/21 0101 07/10/21 1024 07/11/21 1126 07/12/21 0129  NA 129*  --  128* 134* 137 136  K 3.4*  --  4.6 4.6 4.8 5.1  CL 88*  --  91* 100 104 102  CO2 26  --  '25 23 23 27  ' GLUCOSE 119*  --  117* 131* 144* 98  BUN 46*  --  48* '23 20 22  ' CREATININE 0.99  --  1.00 0.73 0.71 0.61  CALCIUM 9.2  --  8.8* 9.2 9.2 8.8*  MG  --  1.9  --  1.4* 1.2* 2.1  PHOS  --   --   --  2.0* 2.2* 3.4    GFR: Estimated Creatinine Clearance: 59.8 mL/min (by C-G formula based on SCr of 0.61 mg/dL). Liver Function Tests: Recent  Labs  Lab 07/10/21 1024 07/11/21 1126 07/12/21 0129  ALBUMIN 2.0* 2.0* 2.0*    No results for input(s): LIPASE, AMYLASE in the last 168 hours. No results for input(s): AMMONIA in the last 168 hours. Coagulation Profile: No results for input(s): INR, PROTIME in the last 168 hours. Cardiac Enzymes: No results for input(s): CKTOTAL, CKMB, CKMBINDEX, TROPONINI in the last 168 hours. BNP (last 3 results) No results for input(s): PROBNP in the last 8760 hours. HbA1C: No results for input(s): HGBA1C in the last 72 hours. CBG: Recent Labs  Lab 07/13/21 2242 07/13/21 2340 07/14/21 0801 07/14/21 0834 07/14/21 1202  GLUCAP 110* 89 74 115* 139*    Lipid Profile: No results for input(s): CHOL, HDL, LDLCALC, TRIG, CHOLHDL, LDLDIRECT in the last 72 hours. Thyroid Function Tests: No results for input(s): TSH, T4TOTAL, FREET4, T3FREE, THYROIDAB in the last 72 hours. Anemia Panel: No results for input(s): VITAMINB12, FOLATE, FERRITIN, TIBC, IRON, RETICCTPCT in the last 72 hours. Sepsis Labs: Recent Labs  Lab 07/08/21 1439 07/08/21 1843  LATICACIDVEN 1.3 1.8     Recent Results (from the past 240 hour(s))  Resp Panel by RT-PCR (Flu A&B, Covid) Nasopharyngeal Swab     Status: None   Collection Time: 07/08/21  4:47 PM   Specimen: Nasopharyngeal Swab; Nasopharyngeal(NP) swabs in vial transport medium  Result Value Ref Range Status   SARS Coronavirus 2 by RT PCR NEGATIVE NEGATIVE Final    Comment: (NOTE) SARS-CoV-2 target nucleic acids are NOT DETECTED.  The SARS-CoV-2 RNA is generally detectable in upper respiratory specimens during the acute phase of infection. The lowest concentration of SARS-CoV-2 viral copies this assay can detect is 138 copies/mL. A negative result does not preclude SARS-Cov-2 infection and should not be used as the sole basis for treatment or other patient management decisions. A negative result may occur with  improper specimen collection/handling,  submission of specimen other than nasopharyngeal swab, presence of viral mutation(s) within the areas targeted by this assay, and inadequate number of viral copies(<138 copies/mL). A negative result must be combined with clinical observations, patient history, and epidemiological information. The expected result is Negative.  Fact Sheet for Patients:  EntrepreneurPulse.com.au  Fact Sheet for Healthcare Providers:  IncredibleEmployment.be  This test is no t yet approved or cleared by the [place] FDA and  has been authorized for detection and/or diagnosis of SARS-CoV-2 by FDA under an Emergency Use Authorization (EUA). This EUA will remain  in effect (meaning this test can be used) for the duration of the COVID-19 declaration under Section 564(b)(1) of the Act, 21 U.S.C.section 360bbb-3(b)(1), unless the authorization is terminated  or revoked sooner.       Influenza A by PCR NEGATIVE NEGATIVE Final   Influenza B by PCR NEGATIVE NEGATIVE Final    Comment: (NOTE) The Xpert Xpress SARS-CoV-2/FLU/RSV plus assay is intended as an aid in the diagnosis of influenza from Nasopharyngeal swab specimens and should not be used as a sole basis for treatment. Nasal washings and aspirates are unacceptable for Xpert Xpress SARS-CoV-2/FLU/RSV testing.  Fact Sheet for Patients: EntrepreneurPulse.com.au  Fact Sheet for Healthcare Providers: IncredibleEmployment.be  This test is not yet approved or cleared by the [place] FDA and has been authorized for detection and/or diagnosis of SARS-CoV-2 by FDA under an Emergency Use Authorization (EUA). This EUA will remain in effect (meaning this test can be used) for the duration of the COVID-19 declaration under Section 564(b)(1) of the Act, 21 U.S.C. section 360bbb-3(b)(1), unless the authorization is terminated or revoked.  Performed at [facility], [place] [address] [place], [place] [address]   Blood culture (routine x 2)     Status: None   Collection Time: 07/08/21  6:43 PM   Specimen: BLOOD LEFT ARM  Result Value Ref Range Status   Specimen Description BLOOD LEFT ARM  Final   Special Requests   Final    BOTTLES DRAWN AEROBIC AND ANAEROBIC Blood Culture adequate volume   Culture   Final    NO GROWTH 5 DAYS Performed at [facility], [place] [address], [place], [place] [address]  Report Status 07/13/2021 FINAL  Final  Blood culture (routine x 2)     Status: None   Collection Time: 07/08/21  8:54 PM   Specimen: BLOOD RIGHT ARM  Result Value Ref Range Status   Specimen Description BLOOD RIGHT ARM  Final   Special Requests   Final    BOTTLES DRAWN AEROBIC AND ANAEROBIC Blood Culture results may not be optimal due to an inadequate volume of blood received in culture bottles   Culture   Final    NO GROWTH 5 DAYS Performed at [facility], [place] [address] [place], [place] [address]    Report Status 07/13/2021 FINAL  Final       Radiology Studies: No results found.    Scheduled Meds:  cefadroxil  500 mg Oral BID   colchicine  0.6 mg Oral Daily   diclofenac Sodium  4 g Topical QID   diphenhydrAMINE  25 mg Oral Q6H   doxycycline  100 mg Oral Q12H   enoxaparin (LOVENOX) injection  40 mg Subcutaneous Q24H   gabapentin  200 mg Oral QHS   insulin aspart  0-5 Units Subcutaneous QHS   insulin aspart  0-9 Units Subcutaneous TID WC   levothyroxine  75 mcg Oral Daily   losartan  100 mg Oral Daily   metFORMIN  500 mg Oral BID WC   metoprolol tartrate  25 mg Oral BID   naproxen  500 mg Oral BID WC   predniSONE  30 mg Oral Q breakfast   rosuvastatin  10 mg Oral Daily   Continuous Infusions:     LOS: 6 days      Time spent: 25 minutes   Dessa Phi, DO [facility] 07/14/2021, 1:16 PM   Available via Epic secure chat 7am-7pm After these hours, please refer to coverage provider listed on [email or web address]

## 2021-07-14 NOTE — Evaluation (Signed)
Occupational Therapy Evaluation Patient Details Name: Isabella Dixon MRN: [id number] DOB: Jul 02, 1946 Today's Date: 07/14/2021   History of Present Illness Pt is a 75 y/o female admitted secondary to increased swelling and redness in R wrist and L ankle. Thought to have Left lower extremity/right upper extremity cellulitis, and right hand tenosynovitis. PMH includes gout, DM, HTN.   Clinical Impression   Pt at this time reported they are having less pain in R hand and L ankle. Pt was able to complete gentle AROM at shoulder and elbow but trace movements distally. Pt was able to complete supine to sitting with min assist, transfer to chair with mod to max assist for pivot transfer as very fearful about pain into LLE. Pt requires moderate for UE and max assist with LE ADLS.  Pt currently with functional limitations due to the deficits listed below (see OT Problem List).  Pt will benefit from skilled OT to increase their safety and independence with ADL and functional mobility for ADL to facilitate discharge to venue listed below.        Recommendations for follow up therapy are one component of a multi-disciplinary discharge planning process, led by the attending physician.  Recommendations may be updated based on patient status, additional functional criteria and insurance authorization.   Follow Up Recommendations  Skilled nursing-short term rehab (<3 hours/day)    Assistance Recommended at Discharge Frequent or constant Supervision/Assistance  Functional Status Assessment  Patient has had a recent decline in their functional status and demonstrates the ability to make significant improvements in function in a reasonable and predictable amount of time.  Equipment Recommendations   (Pt reported they are not sure of all the DME they have at home and if in walk in shower they would have enough space for shower chair)    Recommendations for Other Services       Precautions /  Restrictions Precautions Precautions: Fall Restrictions Weight Bearing Restrictions: No      Mobility Bed Mobility Overal bed mobility: Needs Assistance Bed Mobility: Supine to Sit     Supine to sit: Min assist;HOB elevated          Transfers Overall transfer level: Needs assistance   Transfers: Sit to/from Stand Sit to Stand: Mod assist;Max assist;From elevated surface           General transfer comment: Pt required moderat to then max assist as pt reports they get dizzy as they stand up and very nervous about any WB into LLE      Balance Overall balance assessment: Needs assistance Sitting-balance support: No upper extremity supported;Feet supported Sitting balance-Leahy Scale: Good                                     ADL either performed or assessed with clinical judgement   ADL Overall ADL's : Needs assistance/impaired Eating/Feeding: Set up;Sitting   Grooming: Wash/dry hands;Wash/dry face;Min guard;Cueing for safety;Cueing for sequencing;Sitting   Upper Body Bathing: Moderate assistance;Cueing for safety;Cueing for sequencing;Sitting   Lower Body Bathing: Maximal assistance;Cueing for safety;Cueing for sequencing;Sitting/lateral leans   Upper Body Dressing : Moderate assistance;Cueing for safety;Cueing for sequencing;Sitting   Lower Body Dressing: Maximal assistance;Cueing for safety;Cueing for sequencing;Sitting/lateral leans   Toilet Transfer: Maximal assistance;Cueing for safety;Cueing for sequencing;Stand-pivot;BSC/3in1   Toileting- Clothing Manipulation and Hygiene: Total assistance;Cueing for safety;Cueing for sequencing;Sit to/from stand  Vision         Perception     Praxis      Pertinent Vitals/Pain Pain Assessment: Faces Faces Pain Scale: Hurts little more Pain Location: L ankle and R wrist Pain Descriptors / Indicators: Grimacing;Guarding Pain Intervention(s): Limited activity within patient's  tolerance;Monitored during session;Repositioned     Hand Dominance     Extremity/Trunk Assessment Upper Extremity Assessment Upper Extremity Assessment: RUE deficits/detail RUE Deficits / Details: Limited AROM at shoulder and distally. AROM at shoulder flexion about 50 degrees with 20 degrees abduction. AROM in elbow extension about 20 degrees. trace movements at wrist and distally. RUE Sensation: WNL RUE Coordination: decreased fine motor;decreased gross motor   Lower Extremity Assessment Lower Extremity Assessment: Defer to PT evaluation LLE Deficits / Details: Redness and swelling at ankle. Very limited ROM   Cervical / Trunk Assessment Cervical / Trunk Assessment: Normal   Communication Communication Communication: No difficulties   Cognition Arousal/Alertness: Awake/alert Behavior During Therapy: WFL for tasks assessed/performed Overall Cognitive Status: Within Functional Limits for tasks assessed                                       General Comments       Exercises     Shoulder Instructions      Home Living Family/patient expects to be discharged to:: Private residence Living Arrangements: Spouse/significant other;Children Available Help at Discharge: Family Type of Home: House Home Access: Stairs to enter Secretary/administrator of Steps: 3 Entrance Stairs-Rails: None Home Layout: One level               Home Equipment: Rollator (4 wheels) (Pt reproted they may have other equipment but unsure what is in storage with husband present)          Prior Functioning/Environment Prior Level of Function : Independent/Modified Independent                        OT Problem List: Decreased strength;Decreased range of motion;Decreased activity tolerance;Impaired balance (sitting and/or standing);Decreased safety awareness;Decreased knowledge of use of DME or AE;Impaired UE functional use;Pain      OT Treatment/Interventions:  Self-care/ADL training;Therapeutic exercise;DME and/or AE instruction;Therapeutic activities;Patient/family education;Balance training    OT Goals(Current goals can be found in the care plan section) Acute Rehab OT Goals Patient Stated Goal: to be able to move my R hand more OT Goal Formulation: With patient Time For Goal Achievement: 08/01/21 Potential to Achieve Goals: Good ADL Goals Pt Will Perform Upper Body Bathing: with set-up;sitting Pt Will Perform Lower Body Bathing: with min assist;sit to/from stand Pt Will Transfer to Toilet: with min assist;ambulating;bedside commode  OT Frequency: Min 2X/week   Barriers to D/C:            Co-evaluation              AM-PAC OT "6 Clicks" Daily Activity     Outcome Measure Help from another person eating meals?: A Little Help from another person taking care of personal grooming?: A Little Help from another person toileting, which includes using toliet, bedpan, or urinal?: A Lot Help from another person bathing (including washing, rinsing, drying)?: A Lot Help from another person to put on and taking off regular upper body clothing?: A Lot Help from another person to put on and taking off regular lower body clothing?: A Lot 6 Click Score: 14  End of Session Equipment Utilized During Treatment: Gait belt Nurse Communication: Mobility status  Activity Tolerance: Patient tolerated treatment well Patient left: in chair;with chair alarm set;with call bell/phone within reach  OT Visit Diagnosis: Unsteadiness on feet (R26.81);Other abnormalities of gait and mobility (R26.89);Repeated falls (R29.6);Muscle weakness (generalized) (M62.81);Pain Pain - Right/Left: Right Pain - part of body: Hand (L ankle)                Time: 9179-1505 OT Time Calculation (min): 37 min Charges:  OT General Charges $OT Visit: 1 Visit OT Evaluation $OT Eval Low Complexity: 1 Low OT Treatments $Self Care/Home Management : 8-22 mins  Alphia Moh OTR/L   Acute Rehab Services  [phone] office number [phone] pager number   Alphia Moh 07/14/2021, 12:35 PM

## 2021-07-14 NOTE — Progress Notes (Signed)
[facility]  Date of Admission:  07/08/2021   Total days of inpatient antibiotics 2  Active Problems:   Cellulitis          Assessment: 75 year old female with diabetes, gout on probenecid, osteoarthritis.  Admitted for left foot and right hand cellulitis.  There was concern for deep tissue infection of the right hand.  MRI on the right hand showed tenosynovitis.  Patient was started on ceftriaxone.  ID consulted for antibiotic recommendations.   #Left lower leg and right hand erythema/arthralgia #Right hand tenosynovitis #Leukocytosis #Hx of gout and osteoarthritis -Uric acid wnl on 11/14 -As there are two concurrent sites of erythema, I don't think this is c/w cellulitis. Pt is followed by Rheumatology last visit was in May, 2022 for gout and osteoarthritis. Her RF was mildly elevated in the past.  ANA was positive with reflex SSA and RNP positive. Pt started on prednisone on 11/17, now left foot and right hand erythema has almost resolved - Infectious work up: Lyme panel, Parvo virus PCR, HIV, HCV, Hep c ab and HepB sAg, ASO titer, blood Cx have been negative -MRI left  foot showed subcutaneous edema on dorsum of foot could be seen in the setting of cellulitis, -Per right hand tenosynovitis Ortho consulted no plans for intervention. IR consulted and unsure if any fluid can be aspirated based on MRI on 09/08/20.  Pt's right hand movement has improved, don not think it is infectious in etiology.  Recommendations:  -Continue steroids -Needs close Rheumatology follow-up on discharge. -Complete cefadroxil 500mg  PO bid and Continue doxycyline to complete 7 days of antibiotics(end date 11/22) for possible(unlikely) cellulitis -ID will sign off Microbiology:   Antibiotics: 11/16 CTX -p 11/17 doxy-p Cultures: 11/16 Blood Cx  Subjective: Pt is sitting in chair with family at bedside. She reports her right hand has significantly improved, but not back to  baseline  Review of Systems: Review of Systems  All other systems reviewed and are negative.   Scheduled Meds: . cefadroxil  500 mg Oral BID  . colchicine  0.6 mg Oral Daily  . diclofenac Sodium  4 g Topical QID  . diphenhydrAMINE  25 mg Oral Q6H  . doxycycline  100 mg Oral Q12H  . enoxaparin (LOVENOX) injection  40 mg Subcutaneous Q24H  . gabapentin  200 mg Oral QHS  . insulin aspart  0-5 Units Subcutaneous QHS  . insulin aspart  0-9 Units Subcutaneous TID WC  . levothyroxine  75 mcg Oral Daily  . losartan  100 mg Oral Daily  . metFORMIN  500 mg Oral BID WC  . metoprolol tartrate  25 mg Oral BID  . naproxen  500 mg Oral BID WC  . predniSONE  30 mg Oral Q breakfast  . rosuvastatin  10 mg Oral Daily   Continuous Infusions:   PRN Meds:.acetaminophen **OR** acetaminophen, oxyCODONE No Known Allergies  OBJECTIVE: Vitals:   07/13/21 1617 07/13/21 2327 07/14/21 0409 07/14/21 0804  BP: 125/68 (!) 144/77 (!) 151/82 137/66  Pulse: 72 61 (!) 57 60  Resp:   18 18  Temp: 97.9 F (36.6 C) 97.8 F (36.6 C)  97.6 F (36.4 C)  TempSrc: Oral Oral    SpO2: 97% 98% 96% 97%  Weight:      Height:       Body mass index is 29.29 kg/m.  Physical Exam Constitutional:      Appearance: Normal appearance.  HENT:  Head: Normocephalic and atraumatic.     Right Ear: Tympanic membrane normal.     Left Ear: Tympanic membrane normal.     Nose: Nose normal.     Mouth/Throat:     Mouth: Mucous membranes are moist.  Eyes:     Extraocular Movements: Extraocular movements intact.     Conjunctiva/sclera: Conjunctivae normal.     Pupils: Pupils are equal, round, and reactive to light.  Cardiovascular:     Rate and Rhythm: Normal rate and regular rhythm.     Heart sounds: No murmur heard.   No friction rub. No gallop.  Pulmonary:     Effort: Pulmonary effort is normal.     Breath sounds: Normal breath sounds.  Abdominal:     General: Abdomen is flat.     Palpations: Abdomen is soft.   Skin:    General: Skin is warm and dry.     Comments: Left foot edema/erythema significantly improved, Right hand erythema almost resolved. Movment improved at wrist.   Neurological:     General: No focal deficit present.     Mental Status: She is alert and oriented to person, place, and time.  Psychiatric:        Mood and Affect: Mood normal.      Lab Results Lab Results  Component Value Date   WBC 13.3 (H) 07/12/2021   HGB 11.2 (L) 07/12/2021   HCT 34.8 (L) 07/12/2021   MCV 93.5 07/12/2021   PLT 476 (H) 07/12/2021    Lab Results  Component Value Date   CREATININE 0.61 07/12/2021   BUN 22 07/12/2021   NA 136 07/12/2021   K 5.1 07/12/2021   CL 102 07/12/2021   CO2 27 07/12/2021    Lab Results  Component Value Date   ALT 15 05/05/2021   AST 18 05/05/2021   ALKPHOS 75 05/05/2021   BILITOT 0.4 05/05/2021        Danelle Earthly, MD [facility] 07/14/2021, 9:46 AM

## 2021-07-15 DIAGNOSIS — L03113 Cellulitis of right upper limb: Secondary | ICD-10-CM | POA: Diagnosis not present

## 2021-07-15 LAB — GLUCOSE, CAPILLARY
Glucose-Capillary: 84 mg/dL (ref 70–99)
Glucose-Capillary: 106 mg/dL — ABNORMAL HIGH (ref 70–99)
Glucose-Capillary: 208 mg/dL — ABNORMAL HIGH (ref 70–99)

## 2021-07-15 NOTE — TOC Progression Note (Signed)
Transition of Care [facility]) - Progression Note    Patient Details  Name: ASMI FUGERE MRN: [id number] Date of Birth: 09/26/1945  [facility]) CM/SW Contact  Jimmy Picket, [place] Phone Number: 07/15/2021, 3:21 PM  Clinical Narrative:     Pt chose [facility] for [facility]. [facility] can accept pt at dischrage. [facility] is not accepting patients from Thursday-Sunday, but can accept on Monday. MD states pt should be ready for DC on Monday. Pt will need insurance auth and covid test on Sunday.   Expected Discharge Plan: Skilled Nursing Facility Barriers to Discharge: English as a second language teacher, Continued Medical Work up  Expected Discharge Plan and Services Expected Discharge Plan: Skilled Nursing Facility       Living arrangements for the past 2 months: Single Family Home                                       Social Determinants of Health (SDOH) Interventions    Readmission Risk Interventions No flowsheet data found.  Jimmy Picket, LCSW Clinical Social Worker

## 2021-07-15 NOTE — Progress Notes (Signed)
PROGRESS NOTE    Isabella Dixon  IBB:[id number] DOB: 11-Mar-1946 DOA: 07/08/2021 PCP: Horald Pollen, MD     Brief Narrative:  Isabella Dixon is a 75 year old F with PMH of gout, IDDM-2 with neuropathy, HTN and HLD presenting with pain, swelling and erythema in right forearm/hand and left lower leg and foot.  In ED, slightly tachycardic with mild leukocytosis.  X-ray of right hand and left lower extremity shows soft tissue swelling and severe degenerative arthritis.  Blood cultures obtained.  Started on IV ceftriaxone and admitted.  Infectious disease consulted.  MRI of the right hand ordered.   MRI hand concerning for tenosynovitis and inflammatory arthropathy.  Doxycycline added.  Also started on prednisone for possible gout.  MRI left foot suggesting cellulitis/osteoarthritis.  [facility] consulted for right arm tenosynovitis.  ID consulted as well.  New events last 24 hours / Subjective: Continues to work with PT and OT.  Right hand stiffness and pain continues to improve albeit very very slowly.PT has recommended SNF placement.  Discussed with patient and son at bedside, they continue to contemplate SNF versus home with home health.  Assessment & Plan:   Active Problems:   Cellulitis   Left lower extremity/right upper extremity cellulitis/arthralgia/right hand tenosynovitis: -X-ray suggest cellulitis with arthritis.  MRI of right hand concerning for tenosynovitis and possible inflammatory arthropathy.  MRI of left foot suggestive of cellulitis and osteoarthritis.  Patient has history of gout.  Uric acid 6.2.  Mild leukocytosis but no fever.  CRP and ESR markedly elevated concerning for inflammatory process.  Blood cultures NGTD.  Lyme antibody negative.  AntidsDNA negative.  SSA (Ro) elevated.  Seems to be improving with antibiotic and prednisone.  -ID consulted, signed off 11/22.  Did not feel that this was infectious in nature.  Last day of antibiotic 11/22 -Ortho  consulted for right hand tenosynovitis, recommended splint for comfort, follow up with Dr. Greta Doom next week.  Discussed with Dr. Greta Doom over the phone.  He did not feel repeat MRI would be beneficial.  He recommended continued OT and steroids -Continue prednisone and colchicine -Added naproxen, Voltaren gel and as needed oxycodone   Controlled IDDM-2 with neuropathy and hyperlipidemia: A1c 5.6%.  On metformin 500 mg.  CBG within normal range. -Continue CBG monitoring and SSI while on steroid.    Essential hypertension -Continue metoprolol and losartan.   Hypothyroidism -Continue home Synthroid.  HLD -Continue crestor.    DVT prophylaxis:  enoxaparin (LOVENOX) injection 40 mg Start: 07/08/21 2200  Code Status: Full Family Communication: Son at bedside Disposition Plan:  Status is: Inpatient  Remains inpatient appropriate because: Continued improvement, SNF placement  Antimicrobials:  Anti-infectives (From admission, onward)    Start     Dose/Rate Route Frequency Ordered Stop   07/13/21 1730  cefadroxil (DURICEF) capsule 500 mg        500 mg Oral 2 times daily 07/13/21 0931 07/14/21 1719   07/09/21 1730  cefTRIAXone (ROCEPHIN) 1 g in sodium chloride 0.9 % 100 mL IVPB  Status:  Discontinued        1 g 200 mL/hr over 30 Minutes Intravenous Every 24 hours 07/09/21 1641 07/13/21 0931   07/09/21 1515  doxycycline (VIBRA-TABS) tablet 100 mg        100 mg Oral Every 12 hours 07/09/21 1426 07/14/21 2145   07/09/21 1000  cefTRIAXone (ROCEPHIN) 2 g in sodium chloride 0.9 % 100 mL IVPB  Status:  Discontinued        2  g 200 mL/hr over 30 Minutes Intravenous Every 24 hours 07/08/21 1806 07/09/21 1015   07/08/21 1700  cefTRIAXone (ROCEPHIN) 1 g in sodium chloride 0.9 % 100 mL IVPB        1 g 200 mL/hr over 30 Minutes Intravenous  Once 07/08/21 1646 07/08/21 1941        Objective: Vitals:   07/14/21 0804 07/14/21 1623 07/14/21 2002 07/15/21 0859  BP: 137/66 109/68 122/61 110/63   Pulse: 60 70 68 72  Resp: '18 18  18  ' Temp: 97.6 F (36.4 C) 97.8 F (36.6 C) 97.9 F (36.6 C) 97.7 F (36.5 C)  TempSrc:  Oral Oral Oral  SpO2: 97% 99% 98% 99%  Weight:      Height:        Intake/Output Summary (Last 24 hours) at 07/15/2021 1100 Last data filed at 07/15/2021 0534 Gross per 24 hour  Intake 240 ml  Output 2300 ml  Net -2060 ml    Filed Weights   07/08/21 2004  Weight: 75 kg    Examination: General exam: Appears calm and comfortable  Respiratory system: Clear to auscultation. Respiratory effort normal. Cardiovascular system: S1 & S2 heard, RRR. No pedal edema. Gastrointestinal system: Abdomen is nondistended, soft and nontender. Normal bowel sounds heard. Central nervous system: Alert and oriented. Non focal exam. Speech clear  Extremities: Right hand stiffness with minimal range of motion of the fingers although this is improved from previous examinations.  No erythema noted.  Tender to palpation over her wrist joint.  Left foot with residual erythema but no edema. Psychiatry: Judgement and insight appear stable. Mood & affect appropriate.     Data Reviewed: I have personally reviewed following labs and imaging studies  CBC: Recent Labs  Lab 07/08/21 1439 07/09/21 0101 07/10/21 1024 07/11/21 1126 07/12/21 0129  WBC 12.6* 13.6* 13.0* 12.7* 13.3*  NEUTROABS 9.1*  --  11.0*  --   --   HGB 11.8* 10.9* 12.3 11.7* 11.2*  HCT 35.2* 32.3* 38.4 36.9 34.8*  MCV 90.5 90.0 93.0 92.9 93.5  PLT 405* 376 413* 478* 476*    Basic Metabolic Panel: Recent Labs  Lab 07/08/21 1439 07/08/21 2107 07/09/21 0101 07/10/21 1024 07/11/21 1126 07/12/21 0129  NA 129*  --  128* 134* 137 136  K 3.4*  --  4.6 4.6 4.8 5.1  CL 88*  --  91* 100 104 102  CO2 26  --  '25 23 23 27  ' GLUCOSE 119*  --  117* 131* 144* 98  BUN 46*  --  48* '23 20 22  ' CREATININE 0.99  --  1.00 0.73 0.71 0.61  CALCIUM 9.2  --  8.8* 9.2 9.2 8.8*  MG  --  1.9  --  1.4* 1.2* 2.1  PHOS  --   --    --  2.0* 2.2* 3.4    GFR: Estimated Creatinine Clearance: 59.8 mL/min (by C-G formula based on SCr of 0.61 mg/dL). Liver Function Tests: Recent Labs  Lab 07/10/21 1024 07/11/21 1126 07/12/21 0129  ALBUMIN 2.0* 2.0* 2.0*    No results for input(s): LIPASE, AMYLASE in the last 168 hours. No results for input(s): AMMONIA in the last 168 hours. Coagulation Profile: No results for input(s): INR, PROTIME in the last 168 hours. Cardiac Enzymes: No results for input(s): CKTOTAL, CKMB, CKMBINDEX, TROPONINI in the last 168 hours. BNP (last 3 results) No results for input(s): PROBNP in the last 8760 hours. HbA1C: No results for input(s): HGBA1C in the last  72 hours. CBG: Recent Labs  Lab 07/14/21 0801 07/14/21 0834 07/14/21 1202 07/14/21 1623 07/15/21 0805  GLUCAP 74 115* 139* 124* 84    Lipid Profile: No results for input(s): CHOL, HDL, LDLCALC, TRIG, CHOLHDL, LDLDIRECT in the last 72 hours. Thyroid Function Tests: No results for input(s): TSH, T4TOTAL, FREET4, T3FREE, THYROIDAB in the last 72 hours. Anemia Panel: No results for input(s): VITAMINB12, FOLATE, FERRITIN, TIBC, IRON, RETICCTPCT in the last 72 hours. Sepsis Labs: Recent Labs  Lab 07/08/21 1439 07/08/21 1843  LATICACIDVEN 1.3 1.8     Recent Results (from the past 240 hour(s))  Resp Panel by RT-PCR (Flu A&B, Covid) Nasopharyngeal Swab     Status: None   Collection Time: 07/08/21  4:47 PM   Specimen: Nasopharyngeal Swab; Nasopharyngeal(NP) swabs in vial transport medium  Result Value Ref Range Status   SARS Coronavirus 2 by RT PCR NEGATIVE NEGATIVE Final    Comment: (NOTE) SARS-CoV-2 target nucleic acids are NOT DETECTED.  The SARS-CoV-2 RNA is generally detectable in upper respiratory specimens during the acute phase of infection. The lowest concentration of SARS-CoV-2 viral copies this assay can detect is 138 copies/mL. A negative result does not preclude SARS-Cov-2 infection and should not be used as  the sole basis for treatment or other patient management decisions. A negative result may occur with  improper specimen collection/handling, submission of specimen other than nasopharyngeal swab, presence of viral mutation(s) within the areas targeted by this assay, and inadequate number of viral copies(<138 copies/mL). A negative result must be combined with clinical observations, patient history, and epidemiological information. The expected result is Negative.  Fact Sheet for Patients:  EntrepreneurPulse.com.au  Fact Sheet for Healthcare Providers:  IncredibleEmployment.be  This test is no t yet approved or cleared by the [place] FDA and  has been authorized for detection and/or diagnosis of SARS-CoV-2 by FDA under an Emergency Use Authorization (EUA). This EUA will remain  in effect (meaning this test can be used) for the duration of the COVID-19 declaration under Section 564(b)(1) of the Act, 21 U.S.C.section 360bbb-3(b)(1), unless the authorization is terminated  or revoked sooner.       Influenza A by PCR NEGATIVE NEGATIVE Final   Influenza B by PCR NEGATIVE NEGATIVE Final    Comment: (NOTE) The Xpert Xpress SARS-CoV-2/FLU/RSV plus assay is intended as an aid in the diagnosis of influenza from Nasopharyngeal swab specimens and should not be used as a sole basis for treatment. Nasal washings and aspirates are unacceptable for Xpert Xpress SARS-CoV-2/FLU/RSV testing.  Fact Sheet for Patients: EntrepreneurPulse.com.au  Fact Sheet for Healthcare Providers: IncredibleEmployment.be  This test is not yet approved or cleared by the [place] FDA and has been authorized for detection and/or diagnosis of SARS-CoV-2 by FDA under an Emergency Use Authorization (EUA). This EUA will remain in effect (meaning this test can be used) for the duration of the COVID-19 declaration under Section 564(b)(1) of  the Act, 21 U.S.C. section 360bbb-3(b)(1), unless the authorization is terminated or revoked.  Performed at [facility], [place] [address], [place],  [address]   Blood culture (routine x 2)     Status: None   Collection Time: 07/08/21  6:43 PM   Specimen: BLOOD LEFT ARM  Result Value Ref Range Status   Specimen Description BLOOD LEFT ARM  Final   Special Requests   Final    BOTTLES DRAWN AEROBIC AND ANAEROBIC Blood Culture adequate volume   Culture   Final    NO  GROWTH 5 DAYS Performed at [facility], [place] [address] [place], [place] [address]    Report Status 07/13/2021 FINAL  Final  Blood culture (routine x 2)     Status: None   Collection Time: 07/08/21  8:54 PM   Specimen: BLOOD RIGHT ARM  Result Value Ref Range Status   Specimen Description BLOOD RIGHT ARM  Final   Special Requests   Final    BOTTLES DRAWN AEROBIC AND ANAEROBIC Blood Culture results may not be optimal due to an inadequate volume of blood received in culture bottles   Culture   Final    NO GROWTH 5 DAYS Performed at [facility], [place] [address] [place], [place] [address]    Report Status 07/13/2021 FINAL  Final       Radiology Studies: No results found.    Scheduled Meds:  colchicine  0.6 mg Oral Daily   diclofenac Sodium  4 g Topical QID   diphenhydrAMINE  25 mg Oral Q6H   enoxaparin (LOVENOX) injection  40 mg Subcutaneous Q24H   gabapentin  200 mg Oral QHS   insulin aspart  0-5 Units Subcutaneous QHS   insulin aspart  0-9 Units Subcutaneous TID WC   levothyroxine  75 mcg Oral Daily   losartan  100 mg Oral Daily   metFORMIN  500 mg Oral BID WC   metoprolol tartrate  25 mg Oral BID   predniSONE  30 mg Oral Q breakfast   rosuvastatin  10 mg Oral Daily   Continuous Infusions:     LOS: 7 days      Time spent: 25 minutes   Dessa Phi, DO [facility] 07/15/2021, 11:00 AM   Available via Epic secure chat 7am-7pm After these hours, please refer to  coverage provider listed on [email or web address]

## 2021-07-15 NOTE — Progress Notes (Signed)
Mobility Specialist Progress Note:   07/15/21 1500  Mobility  Activity Ambulated in room;Transferred to/from BSC  Level of Assistance Moderate assist, patient does 50-74% (+2)  Assistive Device Other (Comment) (HHA)  Distance Ambulated (ft) 12 ft  Mobility Out of bed for toileting;Ambulated with assistance in room  Mobility Response Tolerated well  Mobility performed by Mobility specialist  $Mobility charge 1 Mobility   Pt required modA+2 to stand from EOB after being in bed all day. Ambulated 8 ft with 2 person HHA. Pt then requested to sit on BSC, BM successful. Pt back in bed with all needs met.   Nelta Numbers Mobility Specialist  Phone [phone]

## 2021-07-16 DIAGNOSIS — L03113 Cellulitis of right upper limb: Secondary | ICD-10-CM | POA: Diagnosis not present

## 2021-07-16 LAB — GLUCOSE, CAPILLARY
Glucose-Capillary: 124 mg/dL — ABNORMAL HIGH (ref 70–99)
Glucose-Capillary: 180 mg/dL — ABNORMAL HIGH (ref 70–99)
Glucose-Capillary: 137 mg/dL — ABNORMAL HIGH (ref 70–99)

## 2021-07-16 MED ORDER — PREDNISONE 20 MG PO TABS
20.0000 mg | ORAL_TABLET | Freq: Every day | ORAL | Status: AC
  Administered 2021-07-17 – 2021-07-18 (×2): 20 mg via ORAL
  Filled 2021-07-16 (×2): qty 1

## 2021-07-16 NOTE — Progress Notes (Signed)
PROGRESS NOTE    [name]  MHD:[id number] DOB: 10/05/1945 DOA: 07/08/2021 PCP: Horald Pollen, MD     Brief Narrative:  Isabella Dixon is a 75 year old F with PMH of gout, IDDM-2 with neuropathy, HTN and HLD presenting with pain, swelling and erythema in right forearm/hand and left lower leg and foot.  In ED, slightly tachycardic with mild leukocytosis.  X-ray of right hand and left lower extremity shows soft tissue swelling and severe degenerative arthritis.  Blood cultures obtained.  Started on IV ceftriaxone and admitted.  Infectious disease consulted.  MRI of the right hand ordered.   MRI hand concerning for tenosynovitis and inflammatory arthropathy.  Doxycycline added.  Also started on prednisone for possible gout.  MRI left foot suggesting cellulitis/osteoarthritis.  [facility] consulted for right arm tenosynovitis.  ID consulted as well.  New events last 24 hours / Subjective: Continues to have some stiffness in her right hand, but slowly improving.  States that she would like to pursue SNF placement on discharge  Assessment & Plan:   Active Problems:   Cellulitis   Left lower extremity/right upper extremity cellulitis/arthralgia/right hand tenosynovitis: -X-ray suggest cellulitis with arthritis.  MRI of right hand concerning for tenosynovitis and possible inflammatory arthropathy.  MRI of left foot suggestive of cellulitis and osteoarthritis.  Patient has history of gout.  Uric acid 6.2.  Mild leukocytosis but no fever.  CRP and ESR markedly elevated concerning for inflammatory process.  Blood cultures NGTD.  Lyme antibody negative.  AntidsDNA negative.  SSA (Ro) elevated.  Seems to be improving with antibiotic and prednisone.  -ID consulted, signed off 11/22.  Did not feel that this was infectious in nature.  Last day of antibiotic 11/22 -Ortho consulted for right hand tenosynovitis, recommended splint for comfort, follow up with Dr. Greta Doom next week.   Discussed with Dr. Greta Doom over the phone.  He did not feel repeat MRI would be beneficial.  He recommended continued OT and steroids -Continue prednisone and colchicine, wean prednisone   Controlled IDDM-2 with neuropathy and hyperlipidemia: A1c 5.6%.  On metformin 500 mg.  CBG within normal range. -Continue CBG monitoring and SSI while on steroid.    Essential hypertension -Continue metoprolol and losartan.   Hypothyroidism -Continue home Synthroid.  HLD -Continue crestor.    DVT prophylaxis:  enoxaparin (LOVENOX) injection 40 mg Start: 07/08/21 2200  Code Status: Full Family Communication: No family at bedside Disposition Plan:  Status is: Inpatient  Remains inpatient appropriate because: Continued improvement, SNF placement  Antimicrobials:  Anti-infectives (From admission, onward)    Start     Dose/Rate Route Frequency Ordered Stop   07/13/21 1730  cefadroxil (DURICEF) capsule 500 mg        500 mg Oral 2 times daily 07/13/21 0931 07/14/21 1719   07/09/21 1730  cefTRIAXone (ROCEPHIN) 1 g in sodium chloride 0.9 % 100 mL IVPB  Status:  Discontinued        1 g 200 mL/hr over 30 Minutes Intravenous Every 24 hours 07/09/21 1641 07/13/21 0931   07/09/21 1515  doxycycline (VIBRA-TABS) tablet 100 mg        100 mg Oral Every 12 hours 07/09/21 1426 07/14/21 2145   07/09/21 1000  cefTRIAXone (ROCEPHIN) 2 g in sodium chloride 0.9 % 100 mL IVPB  Status:  Discontinued        2 g 200 mL/hr over 30 Minutes Intravenous Every 24 hours 07/08/21 1806 07/09/21 1015   07/08/21 1700  cefTRIAXone (ROCEPHIN) 1 g  in sodium chloride 0.9 % 100 mL IVPB        1 g 200 mL/hr over 30 Minutes Intravenous  Once 07/08/21 1646 07/08/21 1941        Objective: Vitals:   07/15/21 1716 07/15/21 2050 07/16/21 0625 07/16/21 0802  BP: 111/64 111/60 (!) 128/99 109/61  Pulse: 66 72 61 73  Resp: _0 Temp: 98.4 F (36.9 C) 98.2 F (36.8 C) 97.7 F (36.5 C) 98.2 F (36.8 C)  TempSrc:  Oral  Oral Oral  SpO2: 100% 98% 98% 95%  Weight:      Height:        Intake/Output Summary (Last 24 hours) at 07/16/2021 1219 Last data filed at 07/16/2021 0330 Gross per 24 hour  Intake --  Output 1850 ml  Net -1850 ml    Filed Weights   07/08/21 2004  Weight: 75 kg    Examination: General exam: Appears calm and comfortable  Respiratory system: Clear to auscultation. Respiratory effort normal. Cardiovascular system: S1 & S2 heard, RRR. No pedal edema. Gastrointestinal system: Abdomen is nondistended, soft and nontender. Normal bowel sounds heard. Central nervous system: Alert and oriented. Non focal exam. Speech clear  Extremities: Right hand stiffness with reduced range of motion .  No erythema noted.  Tender to palpation over her wrist joint.  Left foot with residual erythema but no edema. Psychiatry: Judgement and insight appear stable. Mood & affect appropriate.     Data Reviewed: I have personally reviewed following labs and imaging studies  CBC: Recent Labs  Lab 07/10/21 1024 07/11/21 1126 07/12/21 0129  WBC 13.0* 12.7* 13.3*  NEUTROABS 11.0*  --   --   HGB 12.3 11.7* 11.2*  HCT 38.4 36.9 34.8*  MCV 93.0 92.9 93.5  PLT 413* 478* 476*    Basic Metabolic Panel: Recent Labs  Lab 07/10/21 1024 07/11/21 1126 07/12/21 0129  NA 134* 137 136  K 4.6 4.8 5.1  CL 100 104 102  CO2 _1 GLUCOSE 131* 144* 98  BUN _2 CREATININE 0.73 0.71 0.61  CALCIUM 9.2 9.2 8.8*  MG 1.4* 1.2* 2.1  PHOS 2.0* 2.2* 3.4    GFR: Estimated Creatinine Clearance: 59.8 mL/min (by C-G formula based on SCr of 0.61 mg/dL). Liver Function Tests: Recent Labs  Lab 07/10/21 1024 07/11/21 1126 07/12/21 0129  ALBUMIN 2.0* 2.0* 2.0*    No results for input(s): LIPASE, AMYLASE in the last 168 hours. No results for input(s): AMMONIA in the last 168 hours. Coagulation Profile: No results for input(s): INR, PROTIME in the last 168 hours. Cardiac Enzymes: No results for  input(s): CKTOTAL, CKMB, CKMBINDEX, TROPONINI in the last 168 hours. BNP (last 3 results) No results for input(s): PROBNP in the last 8760 hours. HbA1C: No results for input(s): HGBA1C in the last 72 hours. CBG: Recent Labs  Lab 07/14/21 1202 07/14/21 1623 07/15/21 0805 07/15/21 1142 07/15/21 1817  GLUCAP 139* 124* 84 106* 208*    Lipid Profile: No results for input(s): CHOL, HDL, LDLCALC, TRIG, CHOLHDL, LDLDIRECT in the last 72 hours. Thyroid Function Tests: No results for input(s): TSH, T4TOTAL, FREET4, T3FREE, THYROIDAB in the last 72 hours. Anemia Panel: No results for input(s): VITAMINB12, FOLATE, FERRITIN, TIBC, IRON, RETICCTPCT in the last 72 hours. Sepsis Labs: No results for input(s): PROCALCITON, LATICACIDVEN in the last 168 hours.   Recent Results (from the past 240 hour(s))  Resp Panel by RT-PCR (Flu A&B, Covid) Nasopharyngeal Swab  Status: None   Collection Time: 07/08/21  4:47 PM   Specimen: Nasopharyngeal Swab; Nasopharyngeal(NP) swabs in vial transport medium  Result Value Ref Range Status   SARS Coronavirus 2 by RT PCR NEGATIVE NEGATIVE Final    Comment: (NOTE) SARS-CoV-2 target nucleic acids are NOT DETECTED.  The SARS-CoV-2 RNA is generally detectable in upper respiratory specimens during the acute phase of infection. The lowest concentration of SARS-CoV-2 viral copies this assay can detect is 138 copies/mL. A negative result does not preclude SARS-Cov-2 infection and should not be used as the sole basis for treatment or other patient management decisions. A negative result may occur with  improper specimen collection/handling, submission of specimen other than nasopharyngeal swab, presence of viral mutation(s) within the areas targeted by this assay, and inadequate number of viral copies(<138 copies/mL). A negative result must be combined with clinical observations, patient history, and epidemiological information. The expected result is  Negative.  Fact Sheet for Patients:  EntrepreneurPulse.com.au  Fact Sheet for Healthcare Providers:  IncredibleEmployment.be  This test is no t yet approved or cleared by the [place] FDA and  has been authorized for detection and/or diagnosis of SARS-CoV-2 by FDA under an Emergency Use Authorization (EUA). This EUA will remain  in effect (meaning this test can be used) for the duration of the COVID-19 declaration under Section 564(b)(1) of the Act, 21 U.S.C.section 360bbb-3(b)(1), unless the authorization is terminated  or revoked sooner.       Influenza A by PCR NEGATIVE NEGATIVE Final   Influenza B by PCR NEGATIVE NEGATIVE Final    Comment: (NOTE) The Xpert Xpress SARS-CoV-2/FLU/RSV plus assay is intended as an aid in the diagnosis of influenza from Nasopharyngeal swab specimens and should not be used as a sole basis for treatment. Nasal washings and aspirates are unacceptable for Xpert Xpress SARS-CoV-2/FLU/RSV testing.  Fact Sheet for Patients: EntrepreneurPulse.com.au  Fact Sheet for Healthcare Providers: IncredibleEmployment.be  This test is not yet approved or cleared by the [place] FDA and has been authorized for detection and/or diagnosis of SARS-CoV-2 by FDA under an Emergency Use Authorization (EUA). This EUA will remain in effect (meaning this test can be used) for the duration of the COVID-19 declaration under Section 564(b)(1) of the Act, 21 U.S.C. section 360bbb-3(b)(1), unless the authorization is terminated or revoked.  Performed at [facility], [place] [address] [place], [place] [address]   Blood culture (routine x 2)     Status: None   Collection Time: 07/08/21  6:43 PM   Specimen: BLOOD LEFT ARM  Result Value Ref Range Status   Specimen Description BLOOD LEFT ARM  Final   Special Requests   Final    BOTTLES DRAWN AEROBIC AND ANAEROBIC Blood Culture adequate  volume   Culture   Final    NO GROWTH 5 DAYS Performed at [facility], [place] [address], [place], [place] [address]    Report Status 07/13/2021 FINAL  Final  Blood culture (routine x 2)     Status: None   Collection Time: 07/08/21  8:54 PM   Specimen: BLOOD RIGHT ARM  Result Value Ref Range Status   Specimen Description BLOOD RIGHT ARM  Final   Special Requests   Final    BOTTLES DRAWN AEROBIC AND ANAEROBIC Blood Culture results may not be optimal due to an inadequate volume of blood received in culture bottles   Culture   Final    NO GROWTH 5 DAYS Performed at [facility]  [facility], [address], [place], [place] [address]    Report Status 07/13/2021 FINAL  Final       Radiology Studies: No results found.    Scheduled Meds:  colchicine  0.6 mg Oral Daily   diclofenac Sodium  4 g Topical QID   diphenhydrAMINE  25 mg Oral Q6H   enoxaparin (LOVENOX) injection  40 mg Subcutaneous Q24H   gabapentin  200 mg Oral QHS   insulin aspart  0-5 Units Subcutaneous QHS   insulin aspart  0-9 Units Subcutaneous TID WC   levothyroxine  75 mcg Oral Daily   losartan  100 mg Oral Daily   metFORMIN  500 mg Oral BID WC   metoprolol tartrate  25 mg Oral BID   predniSONE  30 mg Oral Q breakfast   rosuvastatin  10 mg Oral Daily   Continuous Infusions:     LOS: 8 days      Time spent: 20 minutes   Dessa Phi, DO [facility] 07/16/2021, 12:19 PM   Available via Epic secure chat 7am-7pm After these hours, please refer to coverage provider listed on [email or web address]

## 2021-07-16 NOTE — Progress Notes (Signed)
Mobility Specialist Progress Note   07/16/21 1238  Mobility  Activity Sat and stood x 3;Ambulated in room  Range of Motion/Exercises  (+2)  Level of Assistance Moderate assist, patient does 50-74%  Assistive Device  (HHA)  Distance Ambulated (ft) 14 ft  Mobility Ambulated with assistance in room;Out of bed to chair with meals  Mobility Response Tolerated well  Mobility performed by Mobility specialist;Family member  Bed Position Chair  $Mobility charge 1 Mobility   Received pt in bed getting ready for lunch c/o RLE stiffness and pain but agreeable to mobility. Worked on RLE passive ROM's prior to ambulating and pt felt that helped a lot. ModA +2 for STS x3 but able to stand and support themselves w/o HHA for ~41mins, then ambulated to [facility] and chair w/ no complaints. Upon removing socks, pt's R foot was presented w/ discoloration and swelling which wasn't there prior to starting session. RN and NT were notified and present.   During Mobility: 72 HR, 121/92 BP  Frederico Hamman Mobility Specialist Phone Number [phone]

## 2021-07-17 DIAGNOSIS — M2550 Pain in unspecified joint: Secondary | ICD-10-CM

## 2021-07-17 LAB — GLUCOSE, CAPILLARY
Glucose-Capillary: 173 mg/dL — ABNORMAL HIGH (ref 70–99)
Glucose-Capillary: 178 mg/dL — ABNORMAL HIGH (ref 70–99)
Glucose-Capillary: 178 mg/dL — ABNORMAL HIGH (ref 70–99)

## 2021-07-17 NOTE — Progress Notes (Signed)
Mobility Specialist Progress Note:   07/17/21 1400  Mobility  Activity Ambulated in room  Level of Assistance Minimal assist, patient does 75% or more (+2)  Assistive Device Other (Comment) (HHA)  Distance Ambulated (ft) 15 ft  Mobility Ambulated with assistance in room  Mobility Response Tolerated well  Mobility performed by Mobility specialist  $Mobility charge 1 Mobility   Pt asx during ambulation. Required minA+2 to stand and during ambulation. Coloration of R foot is a more purple color today. RN in room.  Addison Lank Mobility Specialist  Phone [phone]

## 2021-07-17 NOTE — Progress Notes (Signed)
PROGRESS NOTE    Isabella Dixon  GYF:[id number] DOB: May 19, 1946 DOA: 07/08/2021 PCP: Horald Pollen, MD     Brief Narrative:  Isabella Dixon is a 75 year old F with PMH of gout, IDDM-2 with neuropathy, HTN and HLD presenting with pain, swelling and erythema in right forearm/hand and left lower leg and foot.  In ED, slightly tachycardic with mild leukocytosis.  X-ray of right hand and left lower extremity shows soft tissue swelling and severe degenerative arthritis.  Blood cultures obtained.  Started on IV ceftriaxone and admitted.  Infectious disease consulted.  MRI of the right hand ordered.   MRI hand concerning for tenosynovitis and inflammatory arthropathy.  Doxycycline added.  Also started on prednisone for possible gout.  MRI left foot suggesting cellulitis/osteoarthritis.  [facility] consulted for right arm tenosynovitis.  ID consulted as well.  New events last 24 hours / Subjective: No new issues.   Assessment & Plan:   Principal Problem:   Polyarthralgia Active Problems:   Hypertension associated with diabetes ([place])   Hyperlipidemia   Hypothyroid   Diabetes mellitus type 2 in obese ([place])   History of sciatica   Left lower extremity/right upper extremity cellulitis/arthralgia/right hand tenosynovitis: -X-ray suggest cellulitis with arthritis.  MRI of right hand concerning for tenosynovitis and possible inflammatory arthropathy.  MRI of left foot suggestive of cellulitis and osteoarthritis.  Patient has history of gout.  Uric acid 6.2.  Mild leukocytosis but no fever.  CRP and ESR markedly elevated concerning for inflammatory process.  Blood cultures NGTD.  Lyme antibody negative.  AntidsDNA negative.  SSA (Ro) elevated.  Seems to be improving with antibiotic and prednisone.  -ID consulted, signed off 11/22.  Did not feel that this was infectious in nature.  Last day of antibiotic 11/22 -Ortho consulted for right hand tenosynovitis, recommended splint for  comfort, follow up with Dr. Greta Doom next week.  Discussed with Dr. Greta Doom over the phone.  He did not feel repeat MRI would be beneficial.  He recommended continued OT and steroids -Continue prednisone and colchicine, wean prednisone   Controlled IDDM-2 with neuropathy and hyperlipidemia: A1c 5.6%.  On metformin 500 mg.  CBG within normal range. -Continue CBG monitoring and SSI while on steroid.    Essential hypertension -Continue metoprolol and losartan.   Hypothyroidism -Continue home Synthroid.  HLD -Continue crestor.    DVT prophylaxis:  enoxaparin (LOVENOX) injection 40 mg Start: 07/08/21 2200  Code Status: Full Family Communication: Son at bedside Disposition Plan:  Status is: Inpatient  Remains inpatient appropriate because: Continued improvement, SNF placement  Antimicrobials:  Anti-infectives (From admission, onward)    Start     Dose/Rate Route Frequency Ordered Stop   07/13/21 1730  cefadroxil (DURICEF) capsule 500 mg        500 mg Oral 2 times daily 07/13/21 0931 07/14/21 1719   07/09/21 1730  cefTRIAXone (ROCEPHIN) 1 g in sodium chloride 0.9 % 100 mL IVPB  Status:  Discontinued        1 g 200 mL/hr over 30 Minutes Intravenous Every 24 hours 07/09/21 1641 07/13/21 0931   07/09/21 1515  doxycycline (VIBRA-TABS) tablet 100 mg        100 mg Oral Every 12 hours 07/09/21 1426 07/14/21 2145   07/09/21 1000  cefTRIAXone (ROCEPHIN) 2 g in sodium chloride 0.9 % 100 mL IVPB  Status:  Discontinued        2 g 200 mL/hr over 30 Minutes Intravenous Every 24 hours 07/08/21 1806 07/09/21 1015  07/08/21 1700  cefTRIAXone (ROCEPHIN) 1 g in sodium chloride 0.9 % 100 mL IVPB        1 g 200 mL/hr over 30 Minutes Intravenous  Once 07/08/21 1646 07/08/21 1941        Objective: Vitals:   07/16/21 1639 07/16/21 1947 07/17/21 0457 07/17/21 0825  BP: (!) 100/54 103/61 (!) 146/84 126/64  Pulse: 72 78 63 67  Resp: '18 20 18 14  ' Temp: 98.3 F (36.8 C) 98.4 F (36.9 C) 98.3 F  (36.8 C) 97.6 F (36.4 C)  TempSrc: Oral     SpO2: 97% 96% 100% 96%  Weight:      Height:        Intake/Output Summary (Last 24 hours) at 07/17/2021 1229 Last data filed at 07/17/2021 0500 Gross per 24 hour  Intake 240 ml  Output 1800 ml  Net -1560 ml    Filed Weights   07/08/21 2004  Weight: 75 kg    Examination: General exam: Appears calm and comfortable  Respiratory system: Clear to auscultation. Respiratory effort normal. Cardiovascular system: S1 & S2 heard, RRR. No pedal edema. Gastrointestinal system: Abdomen is nondistended, soft and nontender. Normal bowel sounds heard. Central nervous system: Alert and oriented. Non focal exam. Speech clear  Extremities: Right hand stiffness with reduced range of motion .  No erythema noted.  Tender to palpation over her wrist joint.  Left foot with residual erythema but no edema. Psychiatry: Judgement and insight appear stable. Mood & affect appropriate.     Data Reviewed: I have personally reviewed following labs and imaging studies  CBC: Recent Labs  Lab 07/11/21 1126 07/12/21 0129  WBC 12.7* 13.3*  HGB 11.7* 11.2*  HCT 36.9 34.8*  MCV 92.9 93.5  PLT 478* 476*    Basic Metabolic Panel: Recent Labs  Lab 07/11/21 1126 07/12/21 0129  NA 137 136  K 4.8 5.1  CL 104 102  CO2 23 27  GLUCOSE 144* 98  BUN 20 22  CREATININE 0.71 0.61  CALCIUM 9.2 8.8*  MG 1.2* 2.1  PHOS 2.2* 3.4    GFR: Estimated Creatinine Clearance: 59.8 mL/min (by C-G formula based on SCr of 0.61 mg/dL). Liver Function Tests: Recent Labs  Lab 07/11/21 1126 07/12/21 0129  ALBUMIN 2.0* 2.0*    No results for input(s): LIPASE, AMYLASE in the last 168 hours. No results for input(s): AMMONIA in the last 168 hours. Coagulation Profile: No results for input(s): INR, PROTIME in the last 168 hours. Cardiac Enzymes: No results for input(s): CKTOTAL, CKMB, CKMBINDEX, TROPONINI in the last 168 hours. BNP (last 3 results) No results for  input(s): PROBNP in the last 8760 hours. HbA1C: No results for input(s): HGBA1C in the last 72 hours. CBG: Recent Labs  Lab 07/15/21 1817 07/16/21 1228 07/16/21 1633 07/16/21 2055 07/17/21 0821  GLUCAP 208* 124* 180* 137* 173*    Lipid Profile: No results for input(s): CHOL, HDL, LDLCALC, TRIG, CHOLHDL, LDLDIRECT in the last 72 hours. Thyroid Function Tests: No results for input(s): TSH, T4TOTAL, FREET4, T3FREE, THYROIDAB in the last 72 hours. Anemia Panel: No results for input(s): VITAMINB12, FOLATE, FERRITIN, TIBC, IRON, RETICCTPCT in the last 72 hours. Sepsis Labs: No results for input(s): PROCALCITON, LATICACIDVEN in the last 168 hours.   Recent Results (from the past 240 hour(s))  Resp Panel by RT-PCR (Flu A&B, Covid) Nasopharyngeal Swab     Status: None   Collection Time: 07/08/21  4:47 PM   Specimen: Nasopharyngeal Swab; Nasopharyngeal(NP) swabs in vial transport  medium  Result Value Ref Range Status   SARS Coronavirus 2 by RT PCR NEGATIVE NEGATIVE Final    Comment: (NOTE) SARS-CoV-2 target nucleic acids are NOT DETECTED.  The SARS-CoV-2 RNA is generally detectable in upper respiratory specimens during the acute phase of infection. The lowest concentration of SARS-CoV-2 viral copies this assay can detect is 138 copies/mL. A negative result does not preclude SARS-Cov-2 infection and should not be used as the sole basis for treatment or other patient management decisions. A negative result may occur with  improper specimen collection/handling, submission of specimen other than nasopharyngeal swab, presence of viral mutation(s) within the areas targeted by this assay, and inadequate number of viral copies(<138 copies/mL). A negative result must be combined with clinical observations, patient history, and epidemiological information. The expected result is Negative.  Fact Sheet for Patients:  EntrepreneurPulse.com.au  Fact Sheet for Healthcare  Providers:  IncredibleEmployment.be  This test is no t yet approved or cleared by the [place] FDA and  has been authorized for detection and/or diagnosis of SARS-CoV-2 by FDA under an Emergency Use Authorization (EUA). This EUA will remain  in effect (meaning this test can be used) for the duration of the COVID-19 declaration under Section 564(b)(1) of the Act, 21 U.S.C.section 360bbb-3(b)(1), unless the authorization is terminated  or revoked sooner.       Influenza A by PCR NEGATIVE NEGATIVE Final   Influenza B by PCR NEGATIVE NEGATIVE Final    Comment: (NOTE) The Xpert Xpress SARS-CoV-2/FLU/RSV plus assay is intended as an aid in the diagnosis of influenza from Nasopharyngeal swab specimens and should not be used as a sole basis for treatment. Nasal washings and aspirates are unacceptable for Xpert Xpress SARS-CoV-2/FLU/RSV testing.  Fact Sheet for Patients: EntrepreneurPulse.com.au  Fact Sheet for Healthcare Providers: IncredibleEmployment.be  This test is not yet approved or cleared by the [place] FDA and has been authorized for detection and/or diagnosis of SARS-CoV-2 by FDA under an Emergency Use Authorization (EUA). This EUA will remain in effect (meaning this test can be used) for the duration of the COVID-19 declaration under Section 564(b)(1) of the Act, 21 U.S.C. section 360bbb-3(b)(1), unless the authorization is terminated or revoked.  Performed at [facility], [place] [address], [place], [place] [address]   Blood culture (routine x 2)     Status: None   Collection Time: 07/08/21  6:43 PM   Specimen: BLOOD LEFT ARM  Result Value Ref Range Status   Specimen Description BLOOD LEFT ARM  Final   Special Requests   Final    BOTTLES DRAWN AEROBIC AND ANAEROBIC Blood Culture adequate volume   Culture   Final    NO GROWTH 5 DAYS Performed at [facility], [place] [address] [place],  [place] [address]    Report Status 07/13/2021 FINAL  Final  Blood culture (routine x 2)     Status: None   Collection Time: 07/08/21  8:54 PM   Specimen: BLOOD RIGHT ARM  Result Value Ref Range Status   Specimen Description BLOOD RIGHT ARM  Final   Special Requests   Final    BOTTLES DRAWN AEROBIC AND ANAEROBIC Blood Culture results may not be optimal due to an inadequate volume of blood received in culture bottles   Culture   Final    NO GROWTH 5 DAYS Performed at [facility], [place] [address] [place], [place] [address]    Report Status 07/13/2021 FINAL  Final  Radiology Studies: No results found.    Scheduled Meds:  colchicine  0.6 mg Oral Daily   diclofenac Sodium  4 g Topical QID   diphenhydrAMINE  25 mg Oral Q6H   enoxaparin (LOVENOX) injection  40 mg Subcutaneous Q24H   gabapentin  200 mg Oral QHS   insulin aspart  0-5 Units Subcutaneous QHS   insulin aspart  0-9 Units Subcutaneous TID WC   levothyroxine  75 mcg Oral Daily   losartan  100 mg Oral Daily   metFORMIN  500 mg Oral BID WC   metoprolol tartrate  25 mg Oral BID   predniSONE  20 mg Oral Q breakfast   rosuvastatin  10 mg Oral Daily   Continuous Infusions:     LOS: 9 days      Time spent: 20 minutes   Dessa Phi, DO [facility] 07/17/2021, 12:29 PM   Available via Epic secure chat 7am-7pm After these hours, please refer to coverage provider listed on [email or web address]

## 2021-07-18 ENCOUNTER — Inpatient Hospital Stay (HOSPITAL_COMMUNITY): Payer: Medicare Other

## 2021-07-18 DIAGNOSIS — M79605 Pain in left leg: Secondary | ICD-10-CM

## 2021-07-18 DIAGNOSIS — M79604 Pain in right leg: Secondary | ICD-10-CM

## 2021-07-18 DIAGNOSIS — M2550 Pain in unspecified joint: Secondary | ICD-10-CM | POA: Diagnosis not present

## 2021-07-18 DIAGNOSIS — R230 Cyanosis: Secondary | ICD-10-CM

## 2021-07-18 LAB — GLUCOSE, CAPILLARY
Glucose-Capillary: 170 mg/dL — ABNORMAL HIGH (ref 70–99)
Glucose-Capillary: 102 mg/dL — ABNORMAL HIGH (ref 70–99)
Glucose-Capillary: 144 mg/dL — ABNORMAL HIGH (ref 70–99)
Glucose-Capillary: 118 mg/dL — ABNORMAL HIGH (ref 70–99)

## 2021-07-18 MED ORDER — PREDNISONE 5 MG PO TABS
15.0000 mg | ORAL_TABLET | Freq: Every day | ORAL | Status: DC
  Administered 2021-07-19 – 2021-07-20 (×2): 15 mg via ORAL
  Filled 2021-07-18 (×2): qty 1

## 2021-07-18 NOTE — Progress Notes (Signed)
pt to vascular dept

## 2021-07-18 NOTE — Progress Notes (Signed)
PROGRESS NOTE    Isabella Dixon  JJO:[id number] DOB: Mar 10, 1946 DOA: 07/08/2021 PCP: Horald Pollen, MD     Brief Narrative:  Isabella Dixon is a 75 year old F with PMH of gout, IDDM-2 with neuropathy, HTN and HLD presenting with pain, swelling and erythema in right forearm/hand and left lower leg and foot.  In ED, slightly tachycardic with mild leukocytosis.  X-ray of right hand and left lower extremity shows soft tissue swelling and severe degenerative arthritis.  Blood cultures obtained.  Started on IV ceftriaxone and admitted.  Infectious disease consulted.  MRI of the right hand ordered.   MRI hand concerning for tenosynovitis and inflammatory arthropathy.  Doxycycline added.  Also started on prednisone for possible gout.  MRI left foot suggesting cellulitis/osteoarthritis.  [facility] consulted for right arm tenosynovitis.  ID consulted as well.  New events last 24 hours / Subjective: Very upset regarding staff taking too long to answer call bell. Husband and patient also state that when patient hangs her feet off the bed or she attempts ambulation, her LEFT foot turns purple. At rest, the coloring returns back to normal.   Assessment & Plan:   Principal Problem:   Polyarthralgia Active Problems:   Hypertension associated with diabetes ([place])   Hyperlipidemia   Hypothyroid   Diabetes mellitus type 2 in obese [facility])   History of sciatica   Left lower extremity/right upper extremity cellulitis/arthralgia/right hand tenosynovitis: -X-ray suggest cellulitis with arthritis.  MRI of right hand concerning for tenosynovitis and possible inflammatory arthropathy.  MRI of left foot suggestive of cellulitis and osteoarthritis.  Patient has history of gout.  Uric acid 6.2.  Mild leukocytosis but no fever.  CRP and ESR markedly elevated concerning for inflammatory process.  Blood cultures NGTD.  Lyme antibody negative.  AntidsDNA negative.  SSA ([place]) elevated.  Seems to be  improving with antibiotic and prednisone.  -ID consulted, signed off 11/22.  Did not feel that this was infectious in nature.  Last day of antibiotic 11/22 -Ortho consulted for right hand tenosynovitis, recommended splint for comfort, follow up with Dr. Greta Doom next week.  Discussed with Dr. Greta Doom over the phone.  He did not feel repeat MRI would be beneficial.  He recommended continued OT and steroids -ABI normal bilaterally  -Continue prednisone and colchicine, wean prednisone   Controlled IDDM-2 with neuropathy and hyperlipidemia: A1c 5.6%.  On metformin 500 mg.  CBG within normal range. -Continue CBG monitoring and SSI while on steroid.    Essential hypertension -Continue metoprolol and losartan.   Hypothyroidism -Continue home Synthroid.  HLD -Continue crestor.    DVT prophylaxis:  enoxaparin (LOVENOX) injection 40 mg Start: 07/08/21 2200  Code Status: Full Family Communication: Husband at bedside Disposition Plan:  Status is: Inpatient  Remains inpatient appropriate because: Continued improvement, SNF placement  Antimicrobials:  Anti-infectives (From admission, onward)    Start     Dose/Rate Route Frequency Ordered Stop   07/13/21 1730  cefadroxil (DURICEF) capsule 500 mg        500 mg Oral 2 times daily 07/13/21 0931 07/14/21 1719   07/09/21 1730  cefTRIAXone (ROCEPHIN) 1 g in sodium chloride 0.9 % 100 mL IVPB  Status:  Discontinued        1 g 200 mL/hr over 30 Minutes Intravenous Every 24 hours 07/09/21 1641 07/13/21 0931   07/09/21 1515  doxycycline (VIBRA-TABS) tablet 100 mg        100 mg Oral Every 12 hours 07/09/21 1426 07/14/21 2145  07/09/21 1000  cefTRIAXone (ROCEPHIN) 2 g in sodium chloride 0.9 % 100 mL IVPB  Status:  Discontinued        2 g 200 mL/hr over 30 Minutes Intravenous Every 24 hours 07/08/21 1806 07/09/21 1015   07/08/21 1700  cefTRIAXone (ROCEPHIN) 1 g in sodium chloride 0.9 % 100 mL IVPB        1 g 200 mL/hr over 30 Minutes Intravenous  Once  07/08/21 1646 07/08/21 1941        Objective: Vitals:   07/17/21 1500 07/17/21 2030 07/18/21 0623 07/18/21 0725  BP: 101/66 111/62 (!) 144/83 (!) 118/59  Pulse: 72 69 (!) 58 65  Resp: '16 18 18 16  ' Temp: 97.8 F (36.6 C) 98.8 F (37.1 C) 97.8 F (36.6 C) 97.6 F (36.4 C)  TempSrc: Oral Oral Oral Oral  SpO2: 97% 97% 98% 100%  Weight:      Height:        Intake/Output Summary (Last 24 hours) at 07/18/2021 1229 Last data filed at 07/18/2021 4142 Gross per 24 hour  Intake --  Output 1300 ml  Net -1300 ml    Filed Weights   07/08/21 2004  Weight: 75 kg    Examination: General exam: Appears calm and comfortable  Respiratory system: Clear to auscultation. Respiratory effort normal. Cardiovascular system: S1 & S2 heard, RRR. No pedal edema. Gastrointestinal system: Abdomen is nondistended, soft and nontender. Normal bowel sounds heard. Central nervous system: Alert and oriented. Non focal exam. Speech clear  Extremities: Right hand stiffness with reduced range of motion but improving .  No erythema noted.  Left foot with residual erythema but no edema.   Data Reviewed: I have personally reviewed following labs and imaging studies  CBC: Recent Labs  Lab 07/12/21 0129  WBC 13.3*  HGB 11.2*  HCT 34.8*  MCV 93.5  PLT 476*    Basic Metabolic Panel: Recent Labs  Lab 07/12/21 0129  NA 136  K 5.1  CL 102  CO2 27  GLUCOSE 98  BUN 22  CREATININE 0.61  CALCIUM 8.8*  MG 2.1  PHOS 3.4    GFR: Estimated Creatinine Clearance: 59.8 mL/min (by C-G formula based on SCr of 0.61 mg/dL). Liver Function Tests: Recent Labs  Lab 07/12/21 0129  ALBUMIN 2.0*    No results for input(s): LIPASE, AMYLASE in the last 168 hours. No results for input(s): AMMONIA in the last 168 hours. Coagulation Profile: No results for input(s): INR, PROTIME in the last 168 hours. Cardiac Enzymes: No results for input(s): CKTOTAL, CKMB, CKMBINDEX, TROPONINI in the last 168 hours. BNP  (last 3 results) No results for input(s): PROBNP in the last 8760 hours. HbA1C: No results for input(s): HGBA1C in the last 72 hours. CBG: Recent Labs  Lab 07/17/21 0821 07/17/21 1343 07/17/21 1641 07/18/21 0805 07/18/21 1114  GLUCAP 173* 178* 178* 170* 102*    Lipid Profile: No results for input(s): CHOL, HDL, LDLCALC, TRIG, CHOLHDL, LDLDIRECT in the last 72 hours. Thyroid Function Tests: No results for input(s): TSH, T4TOTAL, FREET4, T3FREE, THYROIDAB in the last 72 hours. Anemia Panel: No results for input(s): VITAMINB12, FOLATE, FERRITIN, TIBC, IRON, RETICCTPCT in the last 72 hours. Sepsis Labs: No results for input(s): PROCALCITON, LATICACIDVEN in the last 168 hours.   Recent Results (from the past 240 hour(s))  Resp Panel by RT-PCR (Flu A&B, Covid) Nasopharyngeal Swab     Status: None   Collection Time: 07/08/21  4:47 PM   Specimen: Nasopharyngeal Swab; Nasopharyngeal(NP) swabs  in vial transport medium  Result Value Ref Range Status   SARS Coronavirus 2 by RT PCR NEGATIVE NEGATIVE Final    Comment: (NOTE) SARS-CoV-2 target nucleic acids are NOT DETECTED.  The SARS-CoV-2 RNA is generally detectable in upper respiratory specimens during the acute phase of infection. The lowest concentration of SARS-CoV-2 viral copies this assay can detect is 138 copies/mL. A negative result does not preclude SARS-Cov-2 infection and should not be used as the sole basis for treatment or other patient management decisions. A negative result may occur with  improper specimen collection/handling, submission of specimen other than nasopharyngeal swab, presence of viral mutation(s) within the areas targeted by this assay, and inadequate number of viral copies(<138 copies/mL). A negative result must be combined with clinical observations, patient history, and epidemiological information. The expected result is Negative.  Fact Sheet for Patients:   EntrepreneurPulse.com.au  Fact Sheet for Healthcare Providers:  IncredibleEmployment.be  This test is no t yet approved or cleared by the [place] FDA and  has been authorized for detection and/or diagnosis of SARS-CoV-2 by FDA under an Emergency Use Authorization (EUA). This EUA will remain  in effect (meaning this test can be used) for the duration of the COVID-19 declaration under Section 564(b)(1) of the Act, 21 U.S.C.section 360bbb-3(b)(1), unless the authorization is terminated  or revoked sooner.       Influenza A by PCR NEGATIVE NEGATIVE Final   Influenza B by PCR NEGATIVE NEGATIVE Final    Comment: (NOTE) The Xpert Xpress SARS-CoV-2/FLU/RSV plus assay is intended as an aid in the diagnosis of influenza from Nasopharyngeal swab specimens and should not be used as a sole basis for treatment. Nasal washings and aspirates are unacceptable for Xpert Xpress SARS-CoV-2/FLU/RSV testing.  Fact Sheet for Patients: EntrepreneurPulse.com.au  Fact Sheet for Healthcare Providers: IncredibleEmployment.be  This test is not yet approved or cleared by the [place] FDA and has been authorized for detection and/or diagnosis of SARS-CoV-2 by FDA under an Emergency Use Authorization (EUA). This EUA will remain in effect (meaning this test can be used) for the duration of the COVID-19 declaration under Section 564(b)(1) of the Act, 21 U.S.C. section 360bbb-3(b)(1), unless the authorization is terminated or revoked.  Performed at [facility], [place] [address], [place], [place] [address]   Blood culture (routine x 2)     Status: None   Collection Time: 07/08/21  6:43 PM   Specimen: BLOOD LEFT ARM  Result Value Ref Range Status   Specimen Description BLOOD LEFT ARM  Final   Special Requests   Final    BOTTLES DRAWN AEROBIC AND ANAEROBIC Blood Culture adequate volume   Culture   Final    NO GROWTH 5  DAYS Performed at [facility], [place] [address], [place], [place] [address]    Report Status 07/13/2021 FINAL  Final  Blood culture (routine x 2)     Status: None   Collection Time: 07/08/21  8:54 PM   Specimen: BLOOD RIGHT ARM  Result Value Ref Range Status   Specimen Description BLOOD RIGHT ARM  Final   Special Requests   Final    BOTTLES DRAWN AEROBIC AND ANAEROBIC Blood Culture results may not be optimal due to an inadequate volume of blood received in culture bottles   Culture   Final    NO GROWTH 5 DAYS Performed at [facility], [place] [address], [place], [place] [address]    Report Status 07/13/2021 FINAL  Final  Radiology Studies: VAS [place] ABI WITH/WO TBI  Result Date: 07/18/2021  LOWER EXTREMITY DOPPLER STUDY Patient Name:  Isabella Dixon  Date of Exam:   07/18/2021 Medical Rec #: [id number]             Accession #:    [id number] Date of Birth: 18-Feb-1946             Patient Gender: F Patient Age:   26 years Exam Location:  [facility] Procedure:      VAS [place] ABI WITH/WO TBI Referring Phys: Marianna Cid --------------------------------------------------------------------------------  Indications: Pain, foot turns purple when standing. High Risk Factors: Hypertension, hyperlipidemia, Diabetes. Other Factors: Gout.  Comparison Study: No prior study Performing Technologist: Sharion Dove RVS  Examination Guidelines: A complete evaluation includes at minimum, Doppler waveform signals and systolic blood pressure reading at the level of bilateral brachial, anterior tibial, and posterior tibial arteries, when vessel segments are accessible. Bilateral testing is considered an integral part of a complete examination. Photoelectric Plethysmograph (PPG) waveforms and toe systolic pressure readings are included as required and additional duplex testing as needed. Limited examinations for reoccurring indications may be performed as noted.  ABI Findings:  +---------+------------------+-----+---------+---------------------------------+ Right    Rt Pressure (mmHg)IndexWaveform Comment                           +---------+------------------+-----+---------+---------------------------------+ Brachial                                 tenosynovitis and inflammatory                                             arthropathy                       +---------+------------------+-----+---------+---------------------------------+ PTA      167               1.19 triphasic                                  +---------+------------------+-----+---------+---------------------------------+ DP       161               1.15 triphasic                                  +---------+------------------+-----+---------+---------------------------------+ Great Toe113               0.81                                            +---------+------------------+-----+---------+---------------------------------+ +---------+------------------+-----+---------+-------+ Left     Lt Pressure (mmHg)IndexWaveform Comment +---------+------------------+-----+---------+-------+ Brachial 140                    triphasic        +---------+------------------+-----+---------+-------+ PTA      168               1.20 triphasic        +---------+------------------+-----+---------+-------+ DP       164  1.17 triphasic        +---------+------------------+-----+---------+-------+ Great Toe104               0.74                  +---------+------------------+-----+---------+-------+ +-------+-----------+-----------+------------+------------+ ABI/TBIToday's ABIToday's TBIPrevious ABIPrevious TBI +-------+-----------+-----------+------------+------------+ Right  1.19       0.81                                +-------+-----------+-----------+------------+------------+ Left   1.20       0.74                                 +-------+-----------+-----------+------------+------------+   Summary: Right: Resting right ankle-brachial index is within normal range. No evidence of significant right lower extremity arterial disease. The right toe-brachial index is normal. Left: Resting left ankle-brachial index is within normal range. No evidence of significant left lower extremity arterial disease. The left toe-brachial index is normal.  *See table(s) above for measurements and observations.     Preliminary       Scheduled Meds:  colchicine  0.6 mg Oral Daily   diclofenac Sodium  4 g Topical QID   diphenhydrAMINE  25 mg Oral Q6H   enoxaparin (LOVENOX) injection  40 mg Subcutaneous Q24H   gabapentin  200 mg Oral QHS   insulin aspart  0-5 Units Subcutaneous QHS   insulin aspart  0-9 Units Subcutaneous TID WC   levothyroxine  75 mcg Oral Daily   losartan  100 mg Oral Daily   metFORMIN  500 mg Oral BID WC   metoprolol tartrate  25 mg Oral BID   rosuvastatin  10 mg Oral Daily   Continuous Infusions:     LOS: 10 days      Time spent: 20 minutes   Dessa Phi, DO [facility] 07/18/2021, 12:29 PM   Available via Epic secure chat 7am-7pm After these hours, please refer to coverage provider listed on [email or web address]

## 2021-07-18 NOTE — Progress Notes (Signed)
Dr Alvino Chapel and pt husband at bedside when this rn arrived to bedside. Pt expressing irritation and frustration to Md commenting "why wouldn't I be upset with having to be here", pt expressing strong disappointment and dissatisfaction with  nursing and ancillary response time. This rn and Md acknowledge pt dissatisfaction and needs. This rn and Md provided encouragement and emotional support/reassurance to pt. Pt husband

## 2021-07-18 NOTE — Progress Notes (Signed)
VASCULAR LAB    ABIs have been performed.  See CV proc for preliminary results.   Bharat Antillon, RVT 07/18/2021, 10:53 AM

## 2021-07-19 DIAGNOSIS — E669 Obesity, unspecified: Secondary | ICD-10-CM

## 2021-07-19 DIAGNOSIS — E039 Hypothyroidism, unspecified: Secondary | ICD-10-CM | POA: Diagnosis not present

## 2021-07-19 DIAGNOSIS — I152 Hypertension secondary to endocrine disorders: Secondary | ICD-10-CM

## 2021-07-19 DIAGNOSIS — M2550 Pain in unspecified joint: Secondary | ICD-10-CM | POA: Diagnosis not present

## 2021-07-19 DIAGNOSIS — E1159 Type 2 diabetes mellitus with other circulatory complications: Secondary | ICD-10-CM

## 2021-07-19 DIAGNOSIS — E1169 Type 2 diabetes mellitus with other specified complication: Secondary | ICD-10-CM | POA: Diagnosis not present

## 2021-07-19 LAB — GLUCOSE, CAPILLARY
Glucose-Capillary: 100 mg/dL — ABNORMAL HIGH (ref 70–99)
Glucose-Capillary: 82 mg/dL (ref 70–99)
Glucose-Capillary: 116 mg/dL — ABNORMAL HIGH (ref 70–99)
Glucose-Capillary: 112 mg/dL — ABNORMAL HIGH (ref 70–99)

## 2021-07-19 MED ORDER — COLCHICINE 0.6 MG PO TABS
0.6000 mg | ORAL_TABLET | Freq: Two times a day (BID) | ORAL | Status: DC
  Administered 2021-07-19 – 2021-07-20 (×2): 0.6 mg via ORAL
  Filled 2021-07-19 (×3): qty 1

## 2021-07-19 NOTE — Progress Notes (Signed)
PROGRESS NOTE        PATIENT DETAILS Name: Isabella Dixon Age: 75 y.o. Sex: female Date of Birth: Aug 11, 1946 Admit Date: 07/08/2021 Admitting Physician Emeline General, MD LHT:[id number], Eilleen Kempf, MD  Brief Narrative: Patient is a 75 y.o. female with history of gout, DM-2, HTN, HLD-who presented with pain/swelling/erythema in the right forearm/hand/left leg/left foot-evaluated by ID/orthopedics-with suspicion for inflammatory arthropathy-empirically started on steroids-with gradual improvement.  See below for further details.  Subjective: Lying comfortably in bed-denies any chest pain or shortness of breath.  Continues to have improvement in pain/swelling in her right hand.  Objective: Vitals: Blood pressure 126/70, pulse 65, temperature 98.3 F (36.8 C), temperature source Oral, resp. rate 17, height 5\' 3"  (1.6 m), weight 75 kg, SpO2 100 %.   Exam: Gen Exam:Alert awake-not in any distress HEENT:atraumatic, normocephalic Chest: B/L clear to auscultation anteriorly CVS:S1S2 regular Abdomen:soft non tender, non distended Extremities:no edema Neurology: Non focal Skin: no rash  Pertinent Labs/Radiology: No results for input(s): WBC, HGB, PLT, NA, K, CREATININE, AST, ALT, ALKPHOS, BILITOT in the last 168 hours.    Assessment/Plan: Left lower extremity/right upper extremity tenosynovitis: Suspicion for inflammatory arthropathy (history of gout)-not felt to be infectious-improving with steroids and other supportive care.  Prior MD-discussed with Dr. were for continued OT and steroids.  Have asked patient to ensure that she follows with Dr. Jacalyn Lefevre and with her primary rheumatologist.  Gout: Continue colchicine-unclear if above polyarthritis was due to gout-but seems to be responding to steroids.  HTN: BP stable-continue losartan and metoprolol.  Hypothyroidism: Continue Synthroid  HLD: Continue Crestor  DM-2 (A1c 5.6 on  11/16): CBG stable-continue metformin and SSI.  Recent Labs    07/18/21 2313 07/19/21 0741 07/19/21 1156  GLUCAP 118* 100* 82     BMI Estimated body mass index is 29.29 kg/m as calculated from the following:   Height as of this encounter: 5\' 3"  (1.6 m).   Weight as of this encounter: 75 kg.    Procedures: None Consults: ID,Ortho DVT Prophylaxis: Lovenox Code Status:Full code  Family Communication: Spouse at bedside  Time spent: 37- minutes-Greater than 50% of this time was spent in counseling, explanation of diagnosis, planning of further management, and coordination of care.   Disposition Plan: Status is: Inpatient  Remains inpatient appropriate because: Awaiting SNF placement.   Diet: Diet Order             Diet Heart Room service appropriate? Yes; Fluid consistency: Thin  Diet effective now                     Antimicrobial agents: Anti-infectives (From admission, onward)    Start     Dose/Rate Route Frequency Ordered Stop   07/13/21 1730  cefadroxil (DURICEF) capsule 500 mg        500 mg Oral 2 times daily 07/13/21 0931 07/14/21 1719   07/09/21 1730  cefTRIAXone (ROCEPHIN) 1 g in sodium chloride 0.9 % 100 mL IVPB  Status:  Discontinued        1 g 200 mL/hr over 30 Minutes Intravenous Every 24 hours 07/09/21 1641 07/13/21 0931   07/09/21 1515  doxycycline (VIBRA-TABS) tablet 100 mg        100 mg Oral Every 12 hours 07/09/21 1426 07/14/21 2145   07/09/21 1000  cefTRIAXone (ROCEPHIN) 2 g in sodium  chloride 0.9 % 100 mL IVPB  Status:  Discontinued        2 g 200 mL/hr over 30 Minutes Intravenous Every 24 hours 07/08/21 1806 07/09/21 1015   07/08/21 1700  cefTRIAXone (ROCEPHIN) 1 g in sodium chloride 0.9 % 100 mL IVPB        1 g 200 mL/hr over 30 Minutes Intravenous  Once 07/08/21 1646 07/08/21 1941        MEDICATIONS: Scheduled Meds:  colchicine  0.6 mg Oral Daily   diclofenac Sodium  4 g Topical QID   diphenhydrAMINE  25 mg Oral Q6H    enoxaparin (LOVENOX) injection  40 mg Subcutaneous Q24H   gabapentin  200 mg Oral QHS   insulin aspart  0-5 Units Subcutaneous QHS   insulin aspart  0-9 Units Subcutaneous TID WC   levothyroxine  75 mcg Oral Daily   losartan  100 mg Oral Daily   metFORMIN  500 mg Oral BID WC   metoprolol tartrate  25 mg Oral BID   predniSONE  15 mg Oral Q breakfast   rosuvastatin  10 mg Oral Daily   Continuous Infusions: PRN Meds:.acetaminophen **OR** acetaminophen, oxyCODONE   I have personally reviewed following labs and imaging studies  LABORATORY DATA: CBC: No results for input(s): WBC, NEUTROABS, HGB, HCT, MCV, PLT in the last 168 hours.  Basic Metabolic Panel: No results for input(s): NA, K, CL, CO2, GLUCOSE, BUN, CREATININE, CALCIUM, MG, PHOS in the last 168 hours.  GFR: Estimated Creatinine Clearance: 59.8 mL/min (by C-G formula based on SCr of 0.61 mg/dL).  Liver Function Tests: No results for input(s): AST, ALT, ALKPHOS, BILITOT, PROT, ALBUMIN in the last 168 hours. No results for input(s): LIPASE, AMYLASE in the last 168 hours. No results for input(s): AMMONIA in the last 168 hours.  Coagulation Profile: No results for input(s): INR, PROTIME in the last 168 hours.  Cardiac Enzymes: No results for input(s): CKTOTAL, CKMB, CKMBINDEX, TROPONINI in the last 168 hours.  BNP (last 3 results) No results for input(s): PROBNP in the last 8760 hours.  Lipid Profile: No results for input(s): CHOL, HDL, LDLCALC, TRIG, CHOLHDL, LDLDIRECT in the last 72 hours.  Thyroid Function Tests: No results for input(s): TSH, T4TOTAL, FREET4, T3FREE, THYROIDAB in the last 72 hours.  Anemia Panel: No results for input(s): VITAMINB12, FOLATE, FERRITIN, TIBC, IRON, RETICCTPCT in the last 72 hours.  Urine analysis:    Component Value Date/Time   COLORURINE YELLOW 07/08/2021 2145   APPEARANCEUR CLEAR 07/08/2021 2145   LABSPEC 1.011 07/08/2021 2145   PHURINE 6.0 07/08/2021 2145   GLUCOSEU NEGATIVE  07/08/2021 2145   HGBUR NEGATIVE 07/08/2021 2145   BILIRUBINUR NEGATIVE 07/08/2021 2145   BILIRUBINUR neg 05/08/2013 1634   KETONESUR NEGATIVE 07/08/2021 2145   PROTEINUR NEGATIVE 07/08/2021 2145   UROBILINOGEN 1.0 05/08/2013 1634   NITRITE NEGATIVE 07/08/2021 2145   LEUKOCYTESUR NEGATIVE 07/08/2021 2145    Sepsis Labs: Lactic Acid, Venous    Component Value Date/Time   LATICACIDVEN 1.8 07/08/2021 1843    MICROBIOLOGY: No results found for this or any previous visit (from the past 240 hour(s)).  RADIOLOGY STUDIES/RESULTS: VAS [place] ABI WITH/WO TBI  Result Date: 07/18/2021  LOWER EXTREMITY DOPPLER STUDY Patient Name:  Isabella Dixon  Date of Exam:   07/18/2021 Medical Rec #: [id number]             Accession #:    [id number] Date of Birth: Feb 19, 1946  Patient Gender: F Patient Age:   69 years Exam Location:  [facility] Procedure:      VAS [place] ABI WITH/WO TBI Referring Phys: JENNIFER CHOI --------------------------------------------------------------------------------  Indications: Pain, foot turns purple when standing. High Risk Factors: Hypertension, hyperlipidemia, Diabetes. Other Factors: Gout.  Comparison Study: No prior study Performing Technologist: Sherren Kerns RVS  Examination Guidelines: A complete evaluation includes at minimum, Doppler waveform signals and systolic blood pressure reading at the level of bilateral brachial, anterior tibial, and posterior tibial arteries, when vessel segments are accessible. Bilateral testing is considered an integral part of a complete examination. Photoelectric Plethysmograph (PPG) waveforms and toe systolic pressure readings are included as required and additional duplex testing as needed. Limited examinations for reoccurring indications may be performed as noted.  ABI Findings: +---------+------------------+-----+---------+---------------------------------+ Right    Rt Pressure (mmHg)IndexWaveform Comment                            +---------+------------------+-----+---------+---------------------------------+ Brachial                                 tenosynovitis and inflammatory                                             arthropathy                       +---------+------------------+-----+---------+---------------------------------+ PTA      167               1.19 triphasic                                  +---------+------------------+-----+---------+---------------------------------+ DP       161               1.15 triphasic                                  +---------+------------------+-----+---------+---------------------------------+ Great Toe113               0.81                                            +---------+------------------+-----+---------+---------------------------------+ +---------+------------------+-----+---------+-------+ Left     Lt Pressure (mmHg)IndexWaveform Comment +---------+------------------+-----+---------+-------+ Brachial 140                    triphasic        +---------+------------------+-----+---------+-------+ PTA      168               1.20 triphasic        +---------+------------------+-----+---------+-------+ DP       164               1.17 triphasic        +---------+------------------+-----+---------+-------+ Great Toe104               0.74                  +---------+------------------+-----+---------+-------+ +-------+-----------+-----------+------------+------------+ ABI/TBIToday's ABIToday's TBIPrevious ABIPrevious TBI +-------+-----------+-----------+------------+------------+ Right  1.19       0.81                                +-------+-----------+-----------+------------+------------+ Left   1.20       0.74                                +-------+-----------+-----------+------------+------------+   Summary: Right: Resting right ankle-brachial index is within normal range. No evidence of  significant right lower extremity arterial disease. The right toe-brachial index is normal. Left: Resting left ankle-brachial index is within normal range. No evidence of significant left lower extremity arterial disease. The left toe-brachial index is normal.  *See table(s) above for measurements and observations.     Preliminary      LOS: 11 days   Jeoffrey Massed, MD  [facility]    To contact the attending provider between 7A-7P or the covering provider during after hours 7P-7A, please log into the web site [email or web address] and access using universal [place] password for that web site. If you do not have the password, please call the hospital operator.  07/19/2021, 11:54 AM

## 2021-07-19 NOTE — Progress Notes (Signed)
Occupational Therapy Treatment Patient Details Name: Isabella Dixon MRN: [id number] DOB: [birth year] Today's Date: 07/19/2021   History of present illness Pt is a 75 y/o female admitted secondary to increased swelling and redness in R wrist and L ankle. Thought to have Left lower extremity/right upper extremity cellulitis, and right hand tenosynovitis. PMH includes gout, DM, HTN.   OT comments  Pt making progress with OT goals. She is very motivated to work on functional mobility and ADL's. Pt is very independent, however limited by pain, weakness, and balance concerns. This session pt working on exercises listed below, requiring assist with active ROM for RUE due to pain and stiffness. Additionally, pt working on basic ADL's with handheld assist, requiring mod-max A. Husband and pt educated on safe techniques to assist mobility and provide support to pt physically. Continuing to recommend SNF level therapies to maximize pt's return to independence.    Recommendations for follow up therapy are one component of a multi-disciplinary discharge planning process, led by the attending physician.  Recommendations may be updated based on patient status, additional functional criteria and insurance authorization.    Follow Up Recommendations  Skilled nursing-short term rehab (<3 hours/day)    Assistance Recommended at Discharge Frequent or constant Supervision/Assistance  Equipment Recommendations  None recommended by OT    Recommendations for Other Services      Precautions / Restrictions Precautions Precautions: Fall Restrictions Weight Bearing Restrictions: Yes RUE Weight Bearing: Weight bearing as tolerated LLE Weight Bearing: Weight bearing as tolerated       Mobility Bed Mobility Overal bed mobility: Needs Assistance Bed Mobility: Supine to Sit;Sit to Supine     Supine to sit: Min assist Sit to supine: Min assist   General bed mobility comments: Min A for trunk elevation to  come to sitting    Transfers Overall transfer level: Needs assistance Equipment used: 2 person hand held assist Transfers: Sit to/from Stand Sit to Stand: Mod assist;+2 safety/equipment           General transfer comment: Pt stood x3 this session with increased assist for powering up and steadying.     Balance Overall balance assessment: Needs assistance Sitting-balance support: Feet supported Sitting balance-Leahy Scale: Good     Standing balance support: Bilateral upper extremity supported Standing balance-Leahy Scale: Poor Standing balance comment: Reliant on external support                           ADL either performed or assessed with clinical judgement   ADL Overall ADL's : Needs assistance/impaired     Grooming: Wash/dry hands;Wash/dry face;Oral care;Moderate assistance;Standing Grooming Details (indicate cue type and reason): Needing breaks in between due to pain and discomfort.                 Toilet Transfer: Moderate assistance;+2 for safety/equipment Toilet Transfer Details (indicate cue type and reason): Husband assisting on other side of pt, educated husband on best ways to support mobility and limit injury to himself and his wife. Mod A due to weakness and pain Toileting- Clothing Manipulation and Hygiene: Maximal assistance;+2 for safety/equipment;Sitting/lateral lean;Sit to/from stand Toileting - Clothing Manipulation Details (indicate cue type and reason): Pt attempting, BSC seat not condusive for leaning and pericare, attempting in standing and pt unable to maintain balance well.     Functional mobility during ADLs: Moderate assistance;+2 for safety/equipment General ADL Comments: Pt is limited by pain, weakness, and balance concerns. Unable to use RW  due to pain in RUE and poor balance due to pain in LLE    Extremity/Trunk Assessment              Vision       Perception     Praxis      Cognition Arousal/Alertness:  Awake/alert Behavior During Therapy: WFL for tasks assessed/performed Overall Cognitive Status: Within Functional Limits for tasks assessed                                            Exercises Exercises: General Upper Extremity;Other exercises General Exercises - Upper Extremity Shoulder Flexion: Both;AAROM;10 reps Shoulder ABduction: AAROM;Both;10 reps Elbow Flexion: AAROM;Both;10 reps Elbow Extension: AAROM;Both;10 reps Wrist Flexion: AROM;Left;PROM;Right;5 reps Wrist Extension: AROM;Left;PROM;Right;5 reps Digit Composite Flexion: PROM;Right;AROM;Left;5 reps Composite Extension: PROM;Right;AROM;Left;5 reps Other Exercises Other Exercises: Gentle massaging/ranging of all fingers on R hand, using Lhand Other Exercises: Shoulder shrugs both 10 reps Other Exercises: Scapular pinches 10 reps both   Shoulder Instructions       General Comments VSS on RA, Husband present and supportive during session. Educated on safe handling and physically supporting pt during mobility.    Pertinent Vitals/ Pain       Pain Assessment: Faces Faces Pain Scale: Hurts even more Pain Location: L ankle and R wrist Pain Descriptors / Indicators: Grimacing;Guarding Pain Intervention(s): Monitored during session;Repositioned  Home Living                                          Prior Functioning/Environment              Frequency  Min 2X/week        Progress Toward Goals  OT Goals(current goals can now be found in the care plan section)  Progress towards OT goals: Progressing toward goals  Acute Rehab OT Goals Patient Stated Goal: To lessen pain OT Goal Formulation: With patient Time For Goal Achievement: 08/01/21 Potential to Achieve Goals: Good ADL Goals Pt Will Perform Upper Body Bathing: with set-up;sitting Pt Will Perform Lower Body Bathing: with min assist;sit to/from stand Pt Will Transfer to Toilet: with min assist;ambulating;bedside  commode  Plan Discharge plan remains appropriate;Frequency remains appropriate    Co-evaluation                 AM-PAC OT "6 Clicks" Daily Activity     Outcome Measure   Help from another person eating meals?: A Little Help from another person taking care of personal grooming?: A Lot Help from another person toileting, which includes using toliet, bedpan, or urinal?: A Lot Help from another person bathing (including washing, rinsing, drying)?: A Lot Help from another person to put on and taking off regular upper body clothing?: A Lot Help from another person to put on and taking off regular lower body clothing?: A Lot 6 Click Score: 13    End of Session Equipment Utilized During Treatment: Gait belt  OT Visit Diagnosis: Unsteadiness on feet (R26.81);Other abnormalities of gait and mobility (R26.89);Repeated falls (R29.6);Muscle weakness (generalized) (M62.81);Pain Pain - Right/Left: Right Pain - part of body: Hand   Activity Tolerance Patient tolerated treatment well   Patient Left in bed;with call bell/phone within reach;with family/visitor present   Nurse Communication Mobility status  Time: 1240-1330 OT Time Calculation (min): 50 min  Charges: OT General Charges $OT Visit: 1 Visit OT Treatments $Self Care/Home Management : 23-37 mins $Therapeutic Activity: 8-22 mins  Rosevelt Luu H., OTR/L Acute Rehabilitation  Maurisio Ruddy Elane Merna Baldi 07/19/2021, 8:28 PM

## 2021-07-20 DIAGNOSIS — E1159 Type 2 diabetes mellitus with other circulatory complications: Secondary | ICD-10-CM | POA: Diagnosis not present

## 2021-07-20 DIAGNOSIS — E785 Hyperlipidemia, unspecified: Secondary | ICD-10-CM

## 2021-07-20 DIAGNOSIS — M2550 Pain in unspecified joint: Secondary | ICD-10-CM | POA: Diagnosis not present

## 2021-07-20 DIAGNOSIS — E1169 Type 2 diabetes mellitus with other specified complication: Secondary | ICD-10-CM | POA: Diagnosis not present

## 2021-07-20 LAB — RESP PANEL BY RT-PCR (FLU A&B, COVID) ARPGX2
Influenza A by PCR: NEGATIVE
Influenza B by PCR: NEGATIVE
SARS Coronavirus 2 by RT PCR: NEGATIVE

## 2021-07-20 LAB — GLUCOSE, CAPILLARY: Glucose-Capillary: 105 mg/dL — ABNORMAL HIGH (ref 70–99)

## 2021-07-20 MED ORDER — OXYCODONE HCL 5 MG PO TABS: 5.0000 mg | ORAL_TABLET | Freq: Four times a day (QID) | ORAL | 0 refills | Status: DC | PRN

## 2021-07-20 MED ORDER — COLCHICINE 0.6 MG PO TABS: 1.2000 mg | ORAL_TABLET | Freq: Two times a day (BID) | ORAL | 0 refills | Status: DC

## 2021-07-20 MED ORDER — PREDNISONE 10 MG PO TABS: ORAL_TABLET | ORAL | 0 refills | Status: DC

## 2021-07-20 MED ORDER — DICLOFENAC SODIUM 1 % EX GEL: 4.0000 g | Freq: Four times a day (QID) | CUTANEOUS | Status: DC

## 2021-07-20 MED ORDER — INSULIN ASPART 100 UNIT/ML IJ SOLN: INTRAMUSCULAR | 11 refills | Status: DC

## 2021-07-20 NOTE — Progress Notes (Signed)
Mobility Specialist Progress Note:   07/20/21 1000  Mobility  Activity Transferred:  Bed to chair  Level of Assistance Minimal assist, patient does 75% or more (+2)  Assistive Device Other (Comment) (2 person HHA)  Distance Ambulated (ft) 3 ft  Mobility Out of bed to chair with meals;Ambulated with assistance in room  Mobility Response Tolerated well  Mobility performed by Mobility specialist  Bed Position Chair  $Mobility charge 1 Mobility   Pt asx during ambulation. Not able to bear much weight on LLE this am. Pt sitting up in chair, eager for d/c.  Addison Lank Mobility Specialist  Phone [phone]

## 2021-07-20 NOTE — Care Management Important Message (Signed)
Important Message  Patient Details  Name: Isabella Dixon MRN: [id number] Date of Birth: [birth year]   Medicare Important Message Given:  Yes  11/28 Patient is requesting a bedside commode.   Wadie Lessen 07/20/2021, 1:48 PM

## 2021-07-20 NOTE — Discharge Summary (Signed)
PATIENT DETAILS Name: Isabella Dixon Age: 75 y.o. Sex: female Date of Birth: 11/26/1945 MRN: [id number]. Admitting Physician: Emeline General, MD WUJ:[id number], Eilleen Kempf, MD  Admit Date: 07/08/2021 Discharge date: 07/20/2021  Recommendations for Outpatient Follow-up:  Follow up with PCP in 1-2 weeks Please obtain CMP/CBC in one week Please ensure follow-up with rheumatology (Dr. Corliss Skains) and orthopedic (Dr. Yehuda Budd)  Admitted From:  Home  Disposition: SNF   Home Health: No  Equipment/Devices: None  Discharge Condition: Stable  CODE STATUS: FULL CODE  Diet recommendation:  Diet Order             Diet - low sodium heart healthy           Diet Carb Modified           Diet Heart Room service appropriate? Yes; Fluid consistency: Thin  Diet effective now                    Brief Summary: Patient is a 75 y.o. female with history of gout, DM-2, HTN, HLD-who presented with pain/swelling/erythema in the right forearm/hand/left leg/left foot-evaluated by ID/orthopedics-with suspicion for inflammatory arthropathy-empirically started on steroids-with gradual improvement.  See below for further details.  Brief Hospital Course: Left lower extremity/right upper extremity tenosynovitis: Suspicion for inflammatory arthropathy (history of gout)-not felt to be infectious-improving with significant decrease in pain/swelling of her right upper extremity and left lower extremity.  She was treated with steroids and other supportive care-she was evaluated by infectious disease with recommendations to complete 7 days of doxycycline.  Prior MD-discussed with Dr. Jacalyn Lefevre were for continued OT and steroids taper.  Have asked patient to ensure that she follows with Dr. Yehuda Budd and with her primary rheumatologist (Dr. Corliss Skains).  A autoimmune work-up was commenced-ANA was positive, however her dsDNA was negative, SS a antibody was elevated as well.   Gout: Continue  colchicine-unclear if above polyarthritis was due to gout-but seems to be responding to steroids.   HTN: BP stable-continue losartan and metoprolol.  Hypothyroidism: Continue Synthroid  HLD: Continue Crestor   DM-2 (A1c 5.6 on 11/16): CBG stable-continue metformin and SSI.  BMI Estimated body mass index is 29.29 kg/m as calculated from the following:   Height as of this encounter: 5\' 3"  (1.6 m).   Weight as of this encounter: 75 kg.    Procedures None  Discharge Diagnoses:  Principal Problem:   Polyarthralgia Active Problems:   Hypertension associated with diabetes (HCC)   Hyperlipidemia   Hypothyroid   Diabetes mellitus type 2 in obese [facility])   History of sciatica   Discharge Instructions:  Activity:  As tolerated with Full fall precautions use walker/cane & assistance as needed   Discharge Instructions     Call MD for:  severe uncontrolled pain   Complete by: As directed    Diet - low sodium heart healthy   Complete by: As directed    Diet Carb Modified   Complete by: As directed    Discharge instructions   Complete by: As directed    Follow with Primary MD  Georgina Quint, MD in 1-2 weeks  Please get a complete blood count and chemistry panel checked by your Primary MD at your next visit, and again as instructed by your Primary MD.  Get Medicines reviewed and adjusted: Please take all your medications with you for your next visit with your Primary MD  Laboratory/radiological data: Please request your Primary MD to go over all hospital tests and  procedure/radiological results at the follow up, please ask your Primary MD to get all Hospital records sent to his/her office.  In some cases, they will be blood work, cultures and biopsy results pending at the time of your discharge. Please request that your primary care M.D. follows up on these results.  Also Note the following: If you experience worsening of your admission symptoms, develop shortness of  breath, life threatening emergency, suicidal or homicidal thoughts you must seek medical attention immediately by calling 911 or calling your MD immediately  if symptoms less severe.  You must read complete instructions/literature along with all the possible adverse reactions/side effects for all the Medicines you take and that have been prescribed to you. Take any new Medicines after you have completely understood and accpet all the possible adverse reactions/side effects.   Do not drive when taking Pain medications or sleeping medications (Benzodaizepines)  Do not take more than prescribed Pain, Sleep and Anxiety Medications. It is not advisable to combine anxiety,sleep and pain medications without talking with your primary care practitioner  Special Instructions: If you have smoked or chewed Tobacco  in the last 2 yrs please stop smoking, stop any regular Alcohol  and or any Recreational drug use.  Wear Seat belts while driving.  Please note: You were cared for by a hospitalist during your hospital stay. Once you are discharged, your primary care physician will handle any further medical issues. Please note that NO REFILLS for any discharge medications will be authorized once you are discharged, as it is imperative that you return to your primary care physician (or establish a relationship with a primary care physician if you do not have one) for your post hospital discharge needs so that they can reassess your need for medications and monitor your lab values.   Increase activity slowly   Complete by: As directed       Allergies as of 07/20/2021   No Known Allergies      Medication List     TAKE these medications    chlorpheniramine 4 MG tablet Commonly known as: CHLOR-TRIMETON Take 4 mg by mouth 2 (two) times daily as needed for allergies.   colchicine 0.6 MG tablet Take 2 tablets (1.2 mg total) by mouth 2 (two) times daily. What changed:  when to take this reasons to take  this   diclofenac Sodium 1 % Gel Commonly known as: VOLTAREN Apply 4 g topically 4 (four) times daily.   gabapentin 100 MG capsule Commonly known as: NEURONTIN Take 2 capsules (200 mg total) by mouth at bedtime.   insulin aspart 100 UNIT/ML injection Commonly known as: novoLOG 0-9 Units, Subcutaneous, 3 times daily with meals CBG < 70: Implement Hypoglycemia measures CBG 70 - 120: 0 units CBG 121 - 150: 1 unit CBG 151 - 200: 2 units CBG 201 - 250: 3 units CBG 251 - 300: 5 units CBG 301 - 350: 7 units CBG 351 - 400: 9 units CBG > 400: call MD   levothyroxine 75 MCG tablet Commonly known as: Synthroid Take 1 tablet (75 mcg total) by mouth daily.   losartan 100 MG tablet Commonly known as: COZAAR Take 1 tablet (100 mg total) by mouth daily.   metFORMIN 500 MG tablet Commonly known as: GLUCOPHAGE Take 1 tablet (500 mg total) by mouth 2 (two) times daily with a meal.   metoprolol tartrate 25 MG tablet Commonly known as: LOPRESSOR Take 1 tablet (25 mg total) by mouth 2 (two) times daily.  multivitamin tablet Take 1 tablet by mouth daily.   oxyCODONE 5 MG immediate release tablet Commonly known as: Oxy IR/ROXICODONE Take 1 tablet (5 mg total) by mouth every 6 (six) hours as needed for severe pain or moderate pain.   predniSONE 10 MG tablet Commonly known as: DELTASONE Take 15 mg p.o. daily for 5 days, then 10 mg p.o. daily for 5 days, then 5 mg p.o. daily for 5 days and stop.   probenecid 500 MG tablet Commonly known as: BENEMID Take 1 tablet (500 mg total) by mouth 3 (three) times daily.   rosuvastatin 10 MG tablet Commonly known as: CRESTOR Take 1 tablet (10 mg total) by mouth daily.   vitamin C 250 MG tablet Commonly known as: ASCORBIC ACID Take 250 mg by mouth daily.   VITAMIN D-3 PO Take 2,000 Units by mouth daily.        Follow-up Information     Gomez Cleverly, MD. Schedule an appointment as soon as possible for a visit.   Specialty: Orthopedic  Surgery Contact information: [address] [place] [address] [phone]         Georgina Quint, MD. Schedule an appointment as soon as possible for a visit in 1 week(s).   Specialty: Internal Medicine Contact information: [address] [place] [address] [phone]         Pollyann Savoy, MD. Schedule an appointment as soon as possible for a visit in 1 week(s).   Specialty: Rheumatology Contact information: [address] [place] [address] [phone]                No Known Allergies    Consultations:  ID and orthopedic surgery    Other Procedures/Studies: DG Wrist Complete Right  Result Date: 07/08/2021 CLINICAL DATA:  Pain EXAM: RIGHT WRIST - COMPLETE 3+ VIEW COMPARISON:  None. FINDINGS: No recent fracture or dislocation is seen. Degenerative changes are noted with bony spurs in the first carpometacarpal joint. Osteopenia is seen in bony structures. There is soft tissue swelling over the dorsum. IMPRESSION: No recent fracture or dislocation is seen. Degenerative changes are noted in first carpometacarpal joint. Electronically Signed   By: Ernie Avena M.D.   On: 07/08/2021 16:27   MR HAND RIGHT WO CONTRAST  Result Date: 07/09/2021 CLINICAL DATA:  Aseptic arthritis suspected. History of gout hypertension. New onset of right wrist and hand pain. EXAM: MRI OF THE RIGHT HAND WITHOUT CONTRAST TECHNIQUE: Multiplanar, multisequence MR imaging of the right was performed. No intravenous contrast was administered. COMPARISON:  None. FINDINGS: Bones/Joint/Cartilage No marrow signal abnormality. No fracture or dislocation. Normal alignment. No joint effusion. No erosive changes. Scattered cystic changes in the carpal bones prominent in the lunate and scaphoid and capitate. No periostitis. Ligaments Collateral ligaments are intact. Muscles and Tendons There is fluid along the first, second and fourth extensor  compartment tendons prominent distally about the metacarpals and phalanges concerning for tenosynovitis. No tendon tear. Flexor tendons are normal in size. Trace amount of fluid along the flexor tendons also concerning for tenosynovitis. Muscles are normal. Soft tissue Marked subcutaneous soft tissue swelling prominent about the dorsum of the wrist and hand. No soft tissue mass. IMPRESSION: 1.  No MR evidence of joint effusion or acute osseous abnormality. 2. Moderate fluid along the extensor tendons as well as mild fluid along the flexor tendons concerning for tenosynovitis. Marked soft tissue swelling prominent about the dorsum of the wrist and hand. The findings likely sequela  of inflammatory arthropathy. Clinical correlation is suggested. Electronically Signed   By: Larose Hires D.O.   On: 07/09/2021 10:28   MR FOOT LEFT WO CONTRAST  Result Date: 07/11/2021 CLINICAL DATA:  Cellulitis. History of gout and diabetes with neuropathy EXAM: MRI OF THE LEFT FOOT WITHOUT CONTRAST TECHNIQUE: Multiplanar, multisequence MR imaging of the left forefoot was performed. No intravenous contrast was administered. COMPARISON:  X-ray 07/08/2021 FINDINGS: Bones/Joint/Cartilage No acute fracture or dislocation. No erosion or periostitis. No bone marrow edema or marrow replacement. Moderate degenerative changes of the forefoot most pronounced at the fourth and fifth TMT joint level and also at the interphalangeal joints of the second and third toes. Mild osteoarthritis of the first MTP joint. No significant joint effusions. Ligaments Intact Lisfranc ligament.  No collateral ligament disruption. Muscles and Tendons No acute musculotendinous abnormality.  No tenosynovitis. Soft tissues Subcutaneous edema over the dorsum of the foot. No organized fluid collection. No soft tissue ulceration is evident. IMPRESSION: 1. Subcutaneous edema over the dorsum of the foot, which could be seen in the setting of cellulitis. No organized fluid  collection. 2. No acute osseous abnormality. Specifically, no evidence of osteomyelitis of the left forefoot. 3. Degenerative changes of the forefoot most pronounced at the fourth and fifth TMT joints and IP joints of the second and third toes. Electronically Signed   By: Duanne Guess D.O.   On: 07/11/2021 16:20   DG Hand Complete Right  Result Date: 07/08/2021 CLINICAL DATA:  Right wrist inflammation. EXAM: RIGHT HAND - COMPLETE 3+ VIEW COMPARISON:  Jan 14, 2021 FINDINGS: The second through fifth right fingers are limited in evaluation secondary to their flexed position. There is no evidence of fracture or dislocation. Marked severity degenerative changes are seen involving the interphalangeal joint of the right thumb and visualized interphalangeal joints of the second through fifth right fingers. Moderate severity dorsal soft tissue swelling is seen. IMPRESSION: Marked severity degenerative changes and dorsal soft tissue swelling, without evidence of acute osseous abnormality. Electronically Signed   By: Aram Candela M.D.   On: 07/08/2021 16:24   DG Foot Complete Left  Result Date: 07/08/2021 CLINICAL DATA:  Pain and swelling EXAM: LEFT FOOT - COMPLETE 3+ VIEW COMPARISON:  01/14/2021 FINDINGS: No fracture or dislocation is seen. Osteopenia is seen in bony structures. No focal lytic lesions are seen. Degenerative changes are noted with bony spurs in the interphalangeal joints. There is soft tissue swelling over the dorsum. IMPRESSION: No recent fracture or dislocation is seen. Osteopenia. Degenerative changes are noted in the interphalangeal joints. Electronically Signed   By: Ernie Avena M.D.   On: 07/08/2021 16:24   VAS [place] ABI WITH/WO TBI  Result Date: 07/18/2021  LOWER EXTREMITY DOPPLER STUDY Patient Name:  Isabella Dixon  Date of Exam:   07/18/2021 Medical Rec #: [id number]             Accession #:    [id number] Date of Birth: [birth year]             Patient Gender: F Patient  Age:   41 years Exam Location:  [facility] Procedure:      VAS [place] ABI WITH/WO TBI Referring Phys: JENNIFER CHOI --------------------------------------------------------------------------------  Indications: Pain, foot turns purple when standing. High Risk Factors: Hypertension, hyperlipidemia, Diabetes. Other Factors: Gout.  Comparison Study: No prior study Performing Technologist: Sherren Kerns RVS  Examination Guidelines: A complete evaluation includes at minimum, Doppler waveform signals and systolic blood pressure reading at the level  of bilateral brachial, anterior tibial, and posterior tibial arteries, when vessel segments are accessible. Bilateral testing is considered an integral part of a complete examination. Photoelectric Plethysmograph (PPG) waveforms and toe systolic pressure readings are included as required and additional duplex testing as needed. Limited examinations for reoccurring indications may be performed as noted.  ABI Findings: +---------+------------------+-----+---------+---------------------------------+ Right    Rt Pressure (mmHg)IndexWaveform Comment                           +---------+------------------+-----+---------+---------------------------------+ Brachial                                 tenosynovitis and inflammatory                                             arthropathy                       +---------+------------------+-----+---------+---------------------------------+ PTA      167               1.19 triphasic                                  +---------+------------------+-----+---------+---------------------------------+ DP       161               1.15 triphasic                                  +---------+------------------+-----+---------+---------------------------------+ Great Toe113               0.81                                            +---------+------------------+-----+---------+---------------------------------+  +---------+------------------+-----+---------+-------+ Left     Lt Pressure (mmHg)IndexWaveform Comment +---------+------------------+-----+---------+-------+ Brachial 140                    triphasic        +---------+------------------+-----+---------+-------+ PTA      168               1.20 triphasic        +---------+------------------+-----+---------+-------+ DP       164               1.17 triphasic        +---------+------------------+-----+---------+-------+ Great Toe104               0.74                  +---------+------------------+-----+---------+-------+ +-------+-----------+-----------+------------+------------+ ABI/TBIToday's ABIToday's TBIPrevious ABIPrevious TBI +-------+-----------+-----------+------------+------------+ Right  1.19       0.81                                +-------+-----------+-----------+------------+------------+ Left   1.20       0.74                                +-------+-----------+-----------+------------+------------+  Summary: Right: Resting right ankle-brachial index is within normal range. No evidence of significant right lower extremity arterial disease. The right toe-brachial index is normal. Left: Resting left ankle-brachial index is within normal range. No evidence of significant left lower extremity arterial disease. The left toe-brachial index is normal.  *See table(s) above for measurements and observations.     Preliminary    VAS [place] LOWER EXTREMITY VENOUS (DVT)  Result Date: 07/09/2021  Lower Venous DVT Study Patient Name:  Isabella Dixon  Date of Exam:   07/09/2021 Medical Rec #: [id number]             Accession #:    [id number] Date of Birth: 1946/07/24             Patient Gender: F Patient Age:   56 years Exam Location:  [facility] Procedure:      VAS [place] LOWER EXTREMITY VENOUS (DVT) Referring Phys: Mikey College --------------------------------------------------------------------------------   Indications: Edema, and Erythema.  Comparison Study: No prior study noted Performing Technologist: Sherren Kerns RVS  Examination Guidelines: A complete evaluation includes B-mode imaging, spectral Doppler, color Doppler, and power Doppler as needed of all accessible portions of each vessel. Bilateral testing is considered an integral part of a complete examination. Limited examinations for reoccurring indications may be performed as noted. The reflux portion of the exam is performed with the patient in reverse Trendelenburg.  +-----+---------------+---------+-----------+----------+--------------+ RIGHTCompressibilityPhasicitySpontaneityPropertiesThrombus Aging +-----+---------------+---------+-----------+----------+--------------+ CFV  Full           Yes      Yes                                 +-----+---------------+---------+-----------+----------+--------------+   +---------+---------------+---------+-----------+----------+--------------+ LEFT     CompressibilityPhasicitySpontaneityPropertiesThrombus Aging +---------+---------------+---------+-----------+----------+--------------+ CFV      Full           Yes      Yes                                 +---------+---------------+---------+-----------+----------+--------------+ SFJ      Full                                                        +---------+---------------+---------+-----------+----------+--------------+ FV Prox  Full                                                        +---------+---------------+---------+-----------+----------+--------------+ FV Mid   Full                                                        +---------+---------------+---------+-----------+----------+--------------+ FV DistalFull                                                        +---------+---------------+---------+-----------+----------+--------------+  PFV      Full                                                         +---------+---------------+---------+-----------+----------+--------------+ POP      Full           Yes      Yes                                 +---------+---------------+---------+-----------+----------+--------------+ PTV      Full                                                        +---------+---------------+---------+-----------+----------+--------------+ PERO     Full                                                        +---------+---------------+---------+-----------+----------+--------------+     Summary: RIGHT: - No evidence of common femoral vein obstruction.  LEFT: - There is no evidence of deep vein thrombosis in the lower extremity.  - Ultrasound characteristics of enlarged lymph nodes noted in the groin.  *See table(s) above for measurements and observations. Electronically signed by Gerarda Fraction on 07/09/2021 at 7:05:55 PM.    Final      TODAY-DAY OF DISCHARGE:  Subjective:   Isabella Dixon today has no headache,no chest abdominal pain,no new weakness tingling or numbness, feels much better wants to go home today.   Objective:   Blood pressure 102/71, pulse 68, temperature 97.9 F (36.6 C), temperature source Oral, resp. rate 18, height 5\' 3"  (1.6 m), weight 75 kg, SpO2 99 %.  Intake/Output Summary (Last 24 hours) at 07/20/2021 0941 Last data filed at 07/20/2021 0449 Gross per 24 hour  Intake 180 ml  Output 2400 ml  Net -2220 ml   Filed Weights   07/08/21 2004  Weight: 75 kg    Exam: Awake Alert, Oriented *3, No new F.N deficits, Normal affect [place].AT,PERRAL Supple Neck,No JVD, No cervical lymphadenopathy appriciated.  Symmetrical Chest wall movement, Good air movement bilaterally, CTAB RRR,No Gallops,Rubs or new Murmurs, No Parasternal Heave +ve B.Sounds, Abd Soft, Non tender, No organomegaly appriciated, No rebound -guarding or rigidity. No Cyanosis, Clubbing or edema, No new Rash or bruise   PERTINENT RADIOLOGIC  STUDIES: VAS 07/10/21 ABI WITH/WO TBI  Result Date: 07/18/2021  LOWER EXTREMITY DOPPLER STUDY Patient Name:  Isabella Dixon  Date of Exam:   07/18/2021 Medical Rec #: 07/20/2021             Accession #:    [id number] Date of Birth: 1946-05-29             Patient Gender: F Patient Age:   70 years Exam Location:  [facility] Procedure:      VAS [facility] ABI WITH/WO TBI Referring Phys: JENNIFER CHOI --------------------------------------------------------------------------------  Indications: Pain, foot turns purple when standing. High Risk Factors: Hypertension, hyperlipidemia, Diabetes. Other Factors:  Gout.  Comparison Study: No prior study Performing Technologist: Sherren Kerns RVS  Examination Guidelines: A complete evaluation includes at minimum, Doppler waveform signals and systolic blood pressure reading at the level of bilateral brachial, anterior tibial, and posterior tibial arteries, when vessel segments are accessible. Bilateral testing is considered an integral part of a complete examination. Photoelectric Plethysmograph (PPG) waveforms and toe systolic pressure readings are included as required and additional duplex testing as needed. Limited examinations for reoccurring indications may be performed as noted.  ABI Findings: +---------+------------------+-----+---------+---------------------------------+ Right    Rt Pressure (mmHg)IndexWaveform Comment                           +---------+------------------+-----+---------+---------------------------------+ Brachial                                 tenosynovitis and inflammatory                                             arthropathy                       +---------+------------------+-----+---------+---------------------------------+ PTA      167               1.19 triphasic                                  +---------+------------------+-----+---------+---------------------------------+ DP       161               1.15  triphasic                                  +---------+------------------+-----+---------+---------------------------------+ Great Toe113               0.81                                            +---------+------------------+-----+---------+---------------------------------+ +---------+------------------+-----+---------+-------+ Left     Lt Pressure (mmHg)IndexWaveform Comment +---------+------------------+-----+---------+-------+ Brachial 140                    triphasic        +---------+------------------+-----+---------+-------+ PTA      168               1.20 triphasic        +---------+------------------+-----+---------+-------+ DP       164               1.17 triphasic        +---------+------------------+-----+---------+-------+ Great Toe104               0.74                  +---------+------------------+-----+---------+-------+ +-------+-----------+-----------+------------+------------+ ABI/TBIToday's ABIToday's TBIPrevious ABIPrevious TBI +-------+-----------+-----------+------------+------------+ Right  1.19       0.81                                +-------+-----------+-----------+------------+------------+ Left   1.20  0.74                                +-------+-----------+-----------+------------+------------+   Summary: Right: Resting right ankle-brachial index is within normal range. No evidence of significant right lower extremity arterial disease. The right toe-brachial index is normal. Left: Resting left ankle-brachial index is within normal range. No evidence of significant left lower extremity arterial disease. The left toe-brachial index is normal.  *See table(s) above for measurements and observations.     Preliminary      PERTINENT LAB RESULTS: CBC: No results for input(s): WBC, HGB, HCT, PLT in the last 72 hours. CMET CMP     Component Value Date/Time   NA 136 07/12/2021 0129   NA 141 10/28/2020 0953   K 5.1  07/12/2021 0129   CL 102 07/12/2021 0129   CO2 27 07/12/2021 0129   GLUCOSE 98 07/12/2021 0129   BUN 22 07/12/2021 0129   BUN 12 10/28/2020 0953   CREATININE 0.61 07/12/2021 0129   CREATININE 0.79 07/02/2020 1017   CALCIUM 8.8 (L) 07/12/2021 0129   PROT 8.3 05/05/2021 0908   PROT 8.1 10/28/2020 0953   ALBUMIN 2.0 (L) 07/12/2021 0129   ALBUMIN 4.5 10/28/2020 0953   AST 18 05/05/2021 0908   ALT 15 05/05/2021 0908   ALKPHOS 75 05/05/2021 0908   BILITOT 0.4 05/05/2021 0908   BILITOT 0.4 10/28/2020 0953   GFRNONAA >60 07/12/2021 0129   GFRNONAA 74 07/02/2020 1017   GFRAA 86 07/02/2020 1017    GFR Estimated Creatinine Clearance: 59.8 mL/min (by C-G formula based on SCr of 0.61 mg/dL). No results for input(s): LIPASE, AMYLASE in the last 72 hours. No results for input(s): CKTOTAL, CKMB, CKMBINDEX, TROPONINI in the last 72 hours. Invalid input(s): POCBNP No results for input(s): DDIMER in the last 72 hours. No results for input(s): HGBA1C in the last 72 hours. No results for input(s): CHOL, HDL, LDLCALC, TRIG, CHOLHDL, LDLDIRECT in the last 72 hours. No results for input(s): TSH, T4TOTAL, T3FREE, THYROIDAB in the last 72 hours.  Invalid input(s): FREET3 No results for input(s): VITAMINB12, FOLATE, FERRITIN, TIBC, IRON, RETICCTPCT in the last 72 hours. Coags: No results for input(s): INR in the last 72 hours.  Invalid input(s): PT Microbiology: No results found for this or any previous visit (from the past 240 hour(s)).  FURTHER DISCHARGE INSTRUCTIONS:  Get Medicines reviewed and adjusted: Please take all your medications with you for your next visit with your Primary MD  Laboratory/radiological data: Please request your Primary MD to go over all hospital tests and procedure/radiological results at the follow up, please ask your Primary MD to get all Hospital records sent to his/her office.  In some cases, they will be blood work, cultures and biopsy results pending at the  time of your discharge. Please request that your primary care M.D. goes through all the records of your hospital data and follows up on these results.  Also Note the following: If you experience worsening of your admission symptoms, develop shortness of breath, life threatening emergency, suicidal or homicidal thoughts you must seek medical attention immediately by calling 911 or calling your MD immediately  if symptoms less severe.  You must read complete instructions/literature along with all the possible adverse reactions/side effects for all the Medicines you take and that have been prescribed to you. Take any new Medicines after you have completely understood and accpet all the possible adverse reactions/side  effects.   Do not drive when taking Pain medications or sleeping medications (Benzodaizepines)  Do not take more than prescribed Pain, Sleep and Anxiety Medications. It is not advisable to combine anxiety,sleep and pain medications without talking with your primary care practitioner  Special Instructions: If you have smoked or chewed Tobacco  in the last 2 yrs please stop smoking, stop any regular Alcohol  and or any Recreational drug use.  Wear Seat belts while driving.  Please note: You were cared for by a hospitalist during your hospital stay. Once you are discharged, your primary care physician will handle any further medical issues. Please note that NO REFILLS for any discharge medications will be authorized once you are discharged, as it is imperative that you return to your primary care physician (or establish a relationship with a primary care physician if you do not have one) for your post hospital discharge needs so that they can reassess your need for medications and monitor your lab values.  Total Time spent coordinating discharge including counseling, education and face to face time equals 35 minutes.  SignedJeoffrey Massed 07/20/2021 9:41 AM

## 2021-07-20 NOTE — Progress Notes (Signed)
Daleen Squibb to be D/C'd  per MD order. Report given to [facility] LPN at [facility] at 1350.  VSS, Skin clean, dry and intact without evidence of skin break down, no evidence of skin tears noted.  IV catheter discontinued intact. Site without signs and symptoms of complications. Dressing and pressure applied.  An After Visit Summary was printed and given to [facility] to give to the nurse at [facility].  Pt transported via [facility].

## 2021-07-20 NOTE — TOC Transition Note (Signed)
Transition of Care [facility]) - CM/SW Discharge Note   Patient Details  Name: Isabella Dixon MRN: [id number] Date of Birth: [birth year]  [facility] CM/SW Contact:  Jimmy Picket, LCSW Phone Number: 07/20/2021, 12:35 PM   Clinical Narrative:      Per MD patient ready for DC to [facility]. RN, patient, patient's family, and facility notified of DC. Discharge Summary and FL2 sent to facility. DC packet on chart. Insurance Berkley Harvey has been received and pt is covid negative. Ambulance transport requested for patient.    RN to call report to [phone]. Pt will go to room 1201P.  CSW will sign off for now as social work intervention is no longer needed. Please consult [place] again if new needs arise.   Final next level of care: Skilled Nursing Facility Barriers to Discharge: Barriers Resolved   Patient Goals and CMS Choice Patient states their goals for this hospitalization and ongoing recovery are:: to get better CMS Medicare.gov Compare Post Acute Care list provided to:: Patient Choice offered to / list presented to : Patient  Discharge Placement              Patient chooses bed at: [facility] Patient to be transferred to facility by: [facility] Name of family member notified: husband Patient and family notified of of transfer: 07/20/21  Discharge Plan and Services                                     Social Determinants of Health (SDOH) Interventions     Readmission Risk Interventions No flowsheet data found.   Jimmy Picket, LCSW Clinical Social Worker

## 2021-07-20 NOTE — Progress Notes (Signed)
Physical Therapy Treatment Patient Details Name: Isabella Dixon MRN: [id number] DOB: May 13, 1946 Today's Date: 07/20/2021   History of Present Illness Pt is a 75 y/o female admitted secondary to increased swelling and redness in R wrist and L ankle. Thought to have Left lower extremity/right upper extremity cellulitis, and right hand tenosynovitis. PMH includes gout, DM, HTN.    PT Comments    Pt making steady improvement toward goals, but still needs ST SNF before home.  Emphasis on transitions and progression of gait stability/stamina.    Recommendations for follow up therapy are one component of a multi-disciplinary discharge planning process, led by the attending physician.  Recommendations may be updated based on patient status, additional functional criteria and insurance authorization.  Follow Up Recommendations  Skilled nursing-short term rehab (<3 hours/day)     Assistance Recommended at Discharge Frequent or constant Supervision/Assistance  Equipment Recommendations  None recommended by PT    Recommendations for Other Services       Precautions / Restrictions Precautions Precautions: Fall     Mobility  Bed Mobility Overal bed mobility: Needs Assistance Bed Mobility: Supine to Sit     Supine to sit: Min assist     General bed mobility comments: truncal assist mostly for stability until pt could optimally use L UE    Transfers Overall transfer level: Needs assistance   Transfers: Sit to/from Stand Sit to Stand: Min assist;+2 physical assistance           General transfer comment: cues for hand placement, assist mostly for boost.    Ambulation/Gait Ambulation/Gait assistance: Min assist;+2 physical assistance;+2 safety/equipment Gait Distance (Feet): 80 Feet Assistive device: 2 person hand held assist Gait Pattern/deviations: Step-to pattern;Step-through pattern Gait velocity: reduced Gait velocity interpretation: <1.8 ft/sec, indicate of risk  for recurrent falls   General Gait Details: antalgic gait pattern improved incrementally with time up.  pt still needed bil assist for stability   Stairs             Wheelchair Mobility    Modified Rankin (Stroke Patients Only)       Balance Overall balance assessment: Needs assistance Sitting-balance support: Feet supported Sitting balance-Leahy Scale: Good     Standing balance support: Bilateral upper extremity supported Standing balance-Leahy Scale: Poor Standing balance comment: Reliant on external support                            Cognition Arousal/Alertness: Awake/alert Behavior During Therapy: WFL for tasks assessed/performed Overall Cognitive Status: Within Functional Limits for tasks assessed                                          Exercises      General Comments General comments (skin integrity, edema, etc.): vss      Pertinent Vitals/Pain Faces Pain Scale: Hurts little more Pain Location: L ankle Pain Descriptors / Indicators: Grimacing;Guarding Pain Intervention(s): Monitored during session    Home Living                          Prior Function            PT Goals (current goals can now be found in the care plan section) Acute Rehab PT Goals Patient Stated Goal: to go home PT Goal Formulation: With patient Time For Goal  Achievement: 07/27/21 Potential to Achieve Goals: Good Progress towards PT goals: Progressing toward goals    Frequency    Min 3X/week      PT Plan Current plan remains appropriate    Co-evaluation              AM-PAC PT "6 Clicks" Mobility   Outcome Measure  Help needed turning from your back to your side while in a flat bed without using bedrails?: A Little Help needed moving from lying on your back to sitting on the side of a flat bed without using bedrails?: A Little Help needed moving to and from a bed to a chair (including a wheelchair)?: A Lot Help  needed standing up from a chair using your arms (e.g., wheelchair or bedside chair)?: A Lot Help needed to walk in hospital room?: Total Help needed climbing 3-5 steps with a railing? : Total 6 Click Score: 12    End of Session   Activity Tolerance: Patient tolerated treatment well Patient left: in chair;with call bell/phone within reach Nurse Communication: Mobility status PT Visit Diagnosis: Unsteadiness on feet (R26.81);Difficulty in walking, not elsewhere classified (R26.2) Pain - Right/Left: Left Pain - part of body: Ankle and joints of foot     Time: 1331-1346 PT Time Calculation (min) (ACUTE ONLY): 15 min  Charges:  $Gait Training: 8-22 mins                     07/20/2021  Jacinto Halim., PT Acute Rehabilitation Services [phone]  (pager) [phone]  (office)   Eliseo Gum Kateri Balch 07/20/2021, 3:33 PM

## 2021-07-22 ENCOUNTER — Ambulatory Visit: Payer: Medicare Other | Admitting: Physician Assistant

## 2021-07-22 DIAGNOSIS — M170 Bilateral primary osteoarthritis of knee: Secondary | ICD-10-CM

## 2021-07-22 DIAGNOSIS — Z8639 Personal history of other endocrine, nutritional and metabolic disease: Secondary | ICD-10-CM

## 2021-07-22 DIAGNOSIS — M8589 Other specified disorders of bone density and structure, multiple sites: Secondary | ICD-10-CM

## 2021-07-22 DIAGNOSIS — M19072 Primary osteoarthritis, left ankle and foot: Secondary | ICD-10-CM

## 2021-07-22 DIAGNOSIS — M5136 Other intervertebral disc degeneration, lumbar region: Secondary | ICD-10-CM

## 2021-07-22 DIAGNOSIS — M19041 Primary osteoarthritis, right hand: Secondary | ICD-10-CM

## 2021-07-22 DIAGNOSIS — I10 Essential (primary) hypertension: Secondary | ICD-10-CM

## 2021-07-22 DIAGNOSIS — E119 Type 2 diabetes mellitus without complications: Secondary | ICD-10-CM

## 2021-07-22 DIAGNOSIS — M109 Gout, unspecified: Secondary | ICD-10-CM

## 2021-07-27 LAB — GLUCOSE, CAPILLARY
Glucose-Capillary: 78 mg/dL (ref 70–99)
Glucose-Capillary: 104 mg/dL — ABNORMAL HIGH (ref 70–99)
Glucose-Capillary: 165 mg/dL — ABNORMAL HIGH (ref 70–99)
Glucose-Capillary: 121 mg/dL — ABNORMAL HIGH (ref 70–99)
Glucose-Capillary: 165 mg/dL — ABNORMAL HIGH (ref 70–99)
Glucose-Capillary: 161 mg/dL — ABNORMAL HIGH (ref 70–99)
Glucose-Capillary: 106 mg/dL — ABNORMAL HIGH (ref 70–99)
Glucose-Capillary: 162 mg/dL — ABNORMAL HIGH (ref 70–99)

## 2021-07-28 ENCOUNTER — Ambulatory Visit: Payer: Medicare Other | Admitting: Physician Assistant

## 2021-08-06 ENCOUNTER — Telehealth: Payer: Self-pay | Admitting: Emergency Medicine

## 2021-08-06 NOTE — Telephone Encounter (Signed)
HH ORDERS   Caller Name: [facility] Agency Name: [facility] Callback Phone #: [phone] Service Requested: PT (following discharge from short term rehab) Frequency of Visits: 2 week 1 1 week 4

## 2021-08-06 NOTE — Telephone Encounter (Signed)
Patient calling in  Patient was just released from nursing facility for rehab & wanted to schedule a fu appt.Marland Kitchen advised 1st available w/ provider was 01/03 patient says she can not wait that long.. offered to schedule w/ another provider & patient declined  Please call if able to squeeze patient in on provider schedule [phone]

## 2021-08-07 NOTE — Telephone Encounter (Signed)
Called and spoke w/ pt. Scheduled her an OV.

## 2021-08-09 NOTE — Telephone Encounter (Signed)
Yes, it is okay. Thanks

## 2021-08-25 ENCOUNTER — Ambulatory Visit (INDEPENDENT_AMBULATORY_CARE_PROVIDER_SITE_OTHER): Payer: Medicare Other | Admitting: Emergency Medicine

## 2021-08-25 ENCOUNTER — Other Ambulatory Visit: Payer: Self-pay

## 2021-08-25 ENCOUNTER — Encounter: Payer: Self-pay | Admitting: Emergency Medicine

## 2021-08-25 VITALS — BP 138/76 | HR 73 | Temp 98.4°F | Ht 63.0 in | Wt 171.0 lb

## 2021-08-25 DIAGNOSIS — E1159 Type 2 diabetes mellitus with other circulatory complications: Secondary | ICD-10-CM | POA: Diagnosis not present

## 2021-08-25 DIAGNOSIS — E669 Obesity, unspecified: Secondary | ICD-10-CM

## 2021-08-25 DIAGNOSIS — Z09 Encounter for follow-up examination after completed treatment for conditions other than malignant neoplasm: Secondary | ICD-10-CM | POA: Diagnosis not present

## 2021-08-25 DIAGNOSIS — Z8739 Personal history of other diseases of the musculoskeletal system and connective tissue: Secondary | ICD-10-CM

## 2021-08-25 DIAGNOSIS — I152 Hypertension secondary to endocrine disorders: Secondary | ICD-10-CM | POA: Diagnosis not present

## 2021-08-25 DIAGNOSIS — M2550 Pain in unspecified joint: Secondary | ICD-10-CM

## 2021-08-25 DIAGNOSIS — E1169 Type 2 diabetes mellitus with other specified complication: Secondary | ICD-10-CM | POA: Diagnosis not present

## 2021-08-25 DIAGNOSIS — E785 Hyperlipidemia, unspecified: Secondary | ICD-10-CM

## 2021-08-25 DIAGNOSIS — E039 Hypothyroidism, unspecified: Secondary | ICD-10-CM

## 2021-08-25 LAB — CBC WITH DIFFERENTIAL/PLATELET
Basophils Absolute: 0 10*3/uL (ref 0.0–0.1)
Basophils Relative: 0.4 % (ref 0.0–3.0)
Eosinophils Absolute: 0.1 10*3/uL (ref 0.0–0.7)
Eosinophils Relative: 1.9 % (ref 0.0–5.0)
HCT: 36.6 % (ref 36.0–46.0)
Hemoglobin: 12.1 g/dL (ref 12.0–15.0)
Lymphocytes Relative: 47.1 % — ABNORMAL HIGH (ref 12.0–46.0)
Lymphs Abs: 3 10*3/uL (ref 0.7–4.0)
MCHC: 33 g/dL (ref 30.0–36.0)
MCV: 90.7 fl (ref 78.0–100.0)
Monocytes Absolute: 0.7 10*3/uL (ref 0.1–1.0)
Monocytes Relative: 10.3 % (ref 3.0–12.0)
Neutro Abs: 2.6 10*3/uL (ref 1.4–7.7)
Neutrophils Relative %: 40.3 % — ABNORMAL LOW (ref 43.0–77.0)
Platelets: 288 10*3/uL (ref 150.0–400.0)
RBC: 4.03 Mil/uL (ref 3.87–5.11)
RDW: 14.4 % (ref 11.5–15.5)
WBC: 6.4 10*3/uL (ref 4.0–10.5)

## 2021-08-25 LAB — COMPREHENSIVE METABOLIC PANEL
ALT: 14 U/L (ref 0–35)
AST: 16 U/L (ref 0–37)
Albumin: 3.8 g/dL (ref 3.5–5.2)
Alkaline Phosphatase: 74 U/L (ref 39–117)
BUN: 17 mg/dL (ref 6–23)
CO2: 30 mEq/L (ref 19–32)
Calcium: 10 mg/dL (ref 8.4–10.5)
Chloride: 100 mEq/L (ref 96–112)
Creatinine, Ser: 0.67 mg/dL (ref 0.40–1.20)
GFR: 86.03 mL/min (ref >60.00)
Glucose, Bld: 114 mg/dL — ABNORMAL HIGH (ref 70–99)
Potassium: 3.7 mEq/L (ref 3.5–5.1)
Sodium: 138 mEq/L (ref 135–145)
Total Bilirubin: 0.3 mg/dL (ref 0.2–1.2)
Total Protein: 7.9 g/dL (ref 6.0–8.3)

## 2021-08-25 NOTE — Assessment & Plan Note (Signed)
Clinically euthyroid.  Continue Synthroid 75 mcg daily. 

## 2021-08-25 NOTE — Patient Instructions (Signed)
Health Maintenance After Age 75 After age 75, you are at a higher risk for certain long-term diseases and infections as well as injuries from falls. Falls are a major cause of broken bones and head injuries in people who are older than age 75. Getting regular preventive care can help to keep you healthy and well. Preventive care includes getting regular testing and making lifestyle changes as recommended by your health care provider. Talk with your health care provider about: Which screenings and tests you should have. A screening is a test that checks for a disease when you have no symptoms. A diet and exercise plan that is right for you. What should I know about screenings and tests to prevent falls? Screening and testing are the best ways to find a health problem early. Early diagnosis and treatment give you the best chance of managing medical conditions that are common after age 75. Certain conditions and lifestyle choices may make you more likely to have a fall. Your health care provider may recommend: Regular vision checks. Poor vision and conditions such as cataracts can make you more likely to have a fall. If you wear glasses, make sure to get your prescription updated if your vision changes. Medicine review. Work with your health care provider to regularly review all of the medicines you are taking, including over-the-counter medicines. Ask your health care provider about any side effects that may make you more likely to have a fall. Tell your health care provider if any medicines that you take make you feel dizzy or sleepy. Strength and balance checks. Your health care provider may recommend certain tests to check your strength and balance while standing, walking, or changing positions. Foot health exam. Foot pain and numbness, as well as not wearing proper footwear, can make you more likely to have a fall. Screenings, including: Osteoporosis screening. Osteoporosis is a condition that causes  the bones to get weaker and break more easily. Blood pressure screening. Blood pressure changes and medicines to control blood pressure can make you feel dizzy. Depression screening. You may be more likely to have a fall if you have a fear of falling, feel depressed, or feel unable to do activities that you used to do. Alcohol use screening. Using too much alcohol can affect your balance and may make you more likely to have a fall. Follow these instructions at home: Lifestyle Do not drink alcohol if: Your health care provider tells you not to drink. If you drink alcohol: Limit how much you have to: 0-1 drink a day for women. 0-2 drinks a day for men. Know how much alcohol is in your drink. In the [place] one drink equals one 12 oz bottle of beer (355 mL), one 5 oz glass of wine (148 mL), or one 1 oz glass of hard liquor (44 mL). Do not use any products that contain nicotine or tobacco. These products include cigarettes, chewing tobacco, and vaping devices, such as e-cigarettes. If you need help quitting, ask your health care provider. Activity  Follow a regular exercise program to stay fit. This will help you maintain your balance. Ask your health care provider what types of exercise are appropriate for you. If you need a cane or walker, use it as recommended by your health care provider. Wear supportive shoes that have nonskid soles. Safety  Remove any tripping hazards, such as rugs, cords, and clutter. Install safety equipment such as grab bars in bathrooms and safety rails on stairs. Keep rooms and walkways   well-lit. General instructions Talk with your health care provider about your risks for falling. Tell your health care provider if: You fall. Be sure to tell your health care provider about all falls, even ones that seem minor. You feel dizzy, tiredness (fatigue), or off-balance. Take over-the-counter and prescription medicines only as told by your health care provider. These include  supplements. Eat a healthy diet and maintain a healthy weight. A healthy diet includes low-fat dairy products, low-fat (lean) meats, and fiber from whole grains, beans, and lots of fruits and vegetables. Stay current with your vaccines. Schedule regular health, dental, and eye exams. Summary Having a healthy lifestyle and getting preventive care can help to protect your health and wellness after age 75. Screening and testing are the best way to find a health problem early and help you avoid having a fall. Early diagnosis and treatment give you the best chance for managing medical conditions that are more common for people who are older than age 75. Falls are a major cause of broken bones and head injuries in people who are older than age 75. Take precautions to prevent a fall at home. Work with your health care provider to learn what changes you can make to improve your health and wellness and to prevent falls. This information is not intended to replace advice given to you by your health care provider. Make sure you discuss any questions you have with your health care provider. Document Revised: 12/29/2020 Document Reviewed: 12/29/2020 Elsevier Patient Education  2022 [employer]  

## 2021-08-25 NOTE — Assessment & Plan Note (Signed)
Stable.  Continue rosuvastatin 10 mg daily. 

## 2021-08-25 NOTE — Progress Notes (Signed)
Isabella Dixon 76 y.o.   Chief Complaint  Patient presents with   Hospitalization Follow-up    HISTORY OF PRESENT ILLNESS: This is a 77 y.o. female here for hospital discharge follow-up. Discharge summary as follows:   PATIENT DETAILS Name: Isabella Dixon Age: 76 y.o. Sex: female Date of Birth: 1946/01/28 MRN: [id number]. Admitting Physician: Emeline General, MD [id number], Eilleen Kempf, MD   Admit Date: 07/08/2021 Discharge date: 07/20/2021   Recommendations for Outpatient Follow-up:  Follow up with PCP in 1-2 weeks Please obtain CMP/CBC in one week Please ensure follow-up with rheumatology (Dr. Corliss Skains) and orthopedic (Dr. Yehuda Budd)   Admitted From:  Home   Disposition: SNF   Home Health: No   Equipment/Devices: None   Discharge Condition: Stable   CODE STATUS: FULL CODE   Diet recommendation:  Diet Order                  Diet - low sodium heart healthy             Diet Carb Modified             Diet Heart Room service appropriate? Yes; Fluid consistency: Thin  Diet effective now                         Brief Summary: Patient is a 76 y.o. female with history of gout, DM-2, HTN, HLD-who presented with pain/swelling/erythema in the right forearm/hand/left leg/left foot-evaluated by ID/orthopedics-with suspicion for inflammatory arthropathy-empirically started on steroids-with gradual improvement.  See below for further details.   Brief Hospital Course: Left lower extremity/right upper extremity tenosynovitis: Suspicion for inflammatory arthropathy (history of gout)-not felt to be infectious-improving with significant decrease in pain/swelling of her right upper extremity and left lower extremity.  She was treated with steroids and other supportive care-she was evaluated by infectious disease with recommendations to complete 7 days of doxycycline.  Prior MD-discussed with Dr. Jacalyn Lefevre were for continued OT and steroids taper.   Have asked patient to ensure that she follows with Dr. Yehuda Budd and with her primary rheumatologist (Dr. Corliss Skains).  A autoimmune work-up was commenced-ANA was positive, however her dsDNA was negative, SS a antibody was elevated as well.   Gout: Continue colchicine-unclear if above polyarthritis was due to gout-but seems to be responding to steroids.   HTN: BP stable-continue losartan and metoprolol.  Hypothyroidism: Continue Synthroid  HLD: Continue Crestor   DM-2 (A1c 5.6 on 11/16): CBG stable-continue metformin and SSI.   BMI Estimated body mass index is 29.29 kg/m as calculated from the following:   Height as of this encounter: 5\' 3"  (1.6 m).   Weight as of this encounter: 75 kg.      Procedures None   Discharge Diagnoses:  Principal Problem:   Polyarthralgia Active Problems:   Hypertension associated with diabetes (HCC)   Hyperlipidemia   Hypothyroid   Diabetes mellitus type 2 in obese [facility]   History of sciatica     Discharge Instructions:   Activity:  As tolerated with Full fall precautions use walker/cane & assistance as needed     Making progress much improved. No new complaints or medical concerns today. After reviewing hospital discharge summary I suspect DRESS syndrome.  HPI   Prior to Admission medications   Medication Sig Start Date End Date Taking? Authorizing Provider  chlorpheniramine (CHLOR-TRIMETON) 4 MG tablet Take 4 mg by mouth 2 (two) times daily as needed for allergies.   Yes [provider]  Cholecalciferol (VITAMIN D-3 PO) Take 2,000 Units by mouth daily.   Yes [provider]  colchicine 0.6 MG tablet Take 2 tablets (1.2 mg total) by mouth 2 (two) times daily. 07/20/21  Yes Ghimire, Henreitta Leber, MD  diclofenac Sodium (VOLTAREN) 1 % GEL Apply 4 g topically 4 (four) times daily. 07/20/21  Yes Ghimire, Henreitta Leber, MD  insulin aspart (NOVOLOG) 100 UNIT/ML injection 0-9 Units, Subcutaneous, 3 times daily with meals CBG < 70:  Implement Hypoglycemia measures CBG 70 - 120: 0 units CBG 121 - 150: 1 unit CBG 151 - 200: 2 units CBG 201 - 250: 3 units CBG 251 - 300: 5 units CBG 301 - 350: 7 units CBG 351 - 400: 9 units CBG > 400: call MD 07/20/21  Yes Ghimire, Henreitta Leber, MD  levothyroxine (SYNTHROID) 75 MCG tablet Take 1 tablet (75 mcg total) by mouth daily. 05/05/21 08/25/21 Yes Brentton Wardlow, Ines Bloomer, MD  losartan (COZAAR) 100 MG tablet Take 1 tablet (100 mg total) by mouth daily. 05/05/21  Yes Horald Pollen, MD  metFORMIN (GLUCOPHAGE) 500 MG tablet Take 1 tablet (500 mg total) by mouth 2 (two) times daily with a meal. 05/05/21 08/25/21 Yes Kashlynn Kundert, Ines Bloomer, MD  metoprolol tartrate (LOPRESSOR) 25 MG tablet Take 1 tablet (25 mg total) by mouth 2 (two) times daily. 05/05/21 08/25/21 Yes Smantha Boakye, Ines Bloomer, MD  Multiple Vitamin (MULTIVITAMIN) tablet Take 1 tablet by mouth daily.   Yes [provider]  oxyCODONE (OXY IR/ROXICODONE) 5 MG immediate release tablet Take 1 tablet (5 mg total) by mouth every 6 (six) hours as needed for severe pain or moderate pain. 07/20/21  Yes Ghimire, Henreitta Leber, MD  predniSONE (DELTASONE) 10 MG tablet Take 15 mg p.o. daily for 5 days, then 10 mg p.o. daily for 5 days, then 5 mg p.o. daily for 5 days and stop. 07/20/21  Yes Ghimire, Henreitta Leber, MD  rosuvastatin (CRESTOR) 10 MG tablet Take 1 tablet (10 mg total) by mouth daily. 05/05/21  Yes Chanika Byland, Ines Bloomer, MD  vitamin C (ASCORBIC ACID) 250 MG tablet Take 250 mg by mouth daily.   Yes [provider]  gabapentin (NEURONTIN) 100 MG capsule Take 2 capsules (200 mg total) by mouth at bedtime. 05/05/21 08/03/21  Horald Pollen, MD  probenecid (BENEMID) 500 MG tablet Take 1 tablet (500 mg total) by mouth 3 (three) times daily. 05/05/21 08/03/21  Horald Pollen, MD    No Known Allergies  Patient Active Problem List   Diagnosis Date Noted   Polyarthralgia 07/17/2021   Osteoporosis 05/05/2021   Ectopic pregnancy  05/05/2021   Primary osteoarthritis of both hands 06/21/2019   Primary osteoarthritis of both feet 06/21/2019   Primary osteoarthritis of both knees 06/21/2019   History of sciatica 09/06/2017   Obesity, diabetes, and hypertension syndrome ([place]) 09/06/2017   History of gout 09/06/2017   Diabetes mellitus type 2 in obese ([place]) 05/13/2015   Hypothyroid 07/25/2012   Gouty arthritis 05/02/2012   Osteoarthritis 05/02/2012   Hypertension associated with diabetes ([place]) 05/02/2012   Hyperlipidemia 05/02/2012    Past Medical History:  Diagnosis Date   Anemia    Arthritis    Diabetes mellitus without complication ([place])    Gout    High cholesterol    Hypertension    Osteoporosis     Past Surgical History:  Procedure Laterality Date   ABDOMINAL HYSTERECTOMY     TUBAL LIGATION      Social History  Socioeconomic History   Marital status: Married    Spouse name: Not on file   Number of children: Not on file   Years of education: Not on file   Highest education level: Not on file  Occupational History   Not on file  Tobacco Use   Smoking status: Never   Smokeless tobacco: Never  Vaping Use   Vaping Use: Never used  Substance and Sexual Activity   Alcohol use: No   Drug use: No   Sexual activity: Yes  Other Topics Concern   Not on file  Social History Narrative   Not on file   Social Determinants of Health   Financial Resource Strain: Not on file  Food Insecurity: Not on file  Transportation Needs: Not on file  Physical Activity: Not on file  Stress: Not on file  Social Connections: Not on file  Intimate Partner Violence: Not on file    Family History  Problem Relation Age of Onset   Heart disease Mother    Stroke Mother    Hypertension Mother    Stroke Father    Colon cancer Sister    SIDS Son    Stroke Paternal Grandmother    Cancer Paternal Grandmother    ALS Brother    Stroke Maternal Grandmother    Cancer Paternal Grandfather    Testicular cancer  Son    Down syndrome Son    Heart disease Brother      Review of Systems  Constitutional: Negative.  Negative for chills and fever.  HENT: Negative.  Negative for congestion and sore throat.   Respiratory: Negative.  Negative for cough and shortness of breath.   Cardiovascular: Negative.  Negative for chest pain and palpitations.  Gastrointestinal:  Negative for abdominal pain, diarrhea, nausea and vomiting.  Genitourinary: Negative.  Negative for dysuria and hematuria.  Musculoskeletal:  Positive for joint pain.  Skin:  Positive for rash.  Neurological:  Negative for dizziness and headaches.  All other systems reviewed and are negative.  Vitals:   08/25/21 1438  BP: 138/76  Pulse: 73  Temp: 98.4 F (36.9 C)  SpO2: 95%   Wt Readings from Last 3 Encounters:  08/25/21 171 lb (77.6 kg)  07/08/21 165 lb 5.5 oz (75 kg)  05/05/21 174 lb (78.9 kg)    Physical Exam Vitals reviewed.  Constitutional:      Appearance: Normal appearance.  HENT:     Head: Normocephalic.  Eyes:     Extraocular Movements: Extraocular movements intact.     Pupils: Pupils are equal, round, and reactive to light.  Cardiovascular:     Rate and Rhythm: Normal rate and regular rhythm.     Pulses: Normal pulses.     Heart sounds: Normal heart sounds.  Pulmonary:     Effort: Pulmonary effort is normal.     Breath sounds: Normal breath sounds.  Abdominal:     Palpations: Abdomen is soft.     Tenderness: There is no abdominal tenderness.  Musculoskeletal:     Cervical back: Normal range of motion and neck supple.     Right lower leg: No edema.     Left lower leg: No edema.     Comments: Right hand: Still has some residual swelling and mild erythema with limited range of motio.  Patient states it is "a lot better". Left foot: Mild erythema.  Changes from desquamation noted on the skin. Good distal peripheral pulses.  Normal sensation.  Skin:    General:  Skin is warm and dry.     Capillary Refill:  Capillary refill takes less than 2 seconds.  Neurological:     General: No focal deficit present.     Mental Status: She is alert and oriented to person, place, and time.  Psychiatric:        Mood and Affect: Mood normal.        Behavior: Behavior normal.     ASSESSMENT & PLAN: A total of 47 minutes was spent with the patient and counseling/coordination of care regarding preparing for this visit, reviewing hospital discharge summary, review of most recent blood work results, review of most recent imaging reports, review of all medications, education on nutrition, differential diagnosis of reactive arthritis, need for blood work today, prognosis, documentation and need for follow-up with rheumatologist also.  Problem List Items Addressed This Visit       Cardiovascular and Mediastinum   Hypertension associated with diabetes ([place])    Well-controlled hypertension.  Continue losartan 100 mg and metoprolol tartrate 25 mg twice a day. BP Readings from Last 3 Encounters:  08/25/21 138/76  07/20/21 102/71  05/05/21 130/70  Well-controlled diabetes on metformin. Lab Results  Component Value Date   HGBA1C 5.6 07/08/2021  Diet and nutrition discussed. Follow-up in 3 months.        Relevant Orders   CBC with Differential/Platelet   Comprehensive metabolic panel   Obesity, diabetes, and hypertension syndrome (HCC)     Endocrine   Hypothyroid    Clinically euthyroid.  Continue Synthroid 75 mcg daily.        Other   Hyperlipidemia    Stable.  Continue rosuvastatin 10 mg daily.      History of gout   Polyarthralgia    Status post reactive polyarthralgia.  Suspected DRESS syndrome. Has history of gout.  Intolerant to allopurinol.  Presently on probenecid. Not taking daily colchicine.  Colchicine did help a lot with the symptoms while at the hospital.  Has follow-up appointment with rheumatologist in the next 2 weeks. No longer on steroids.      Other Visit Diagnoses      Hospital discharge follow-up    -  Primary   Dyslipidemia associated with type 2 diabetes mellitus [facility])          Patient Instructions  Health Maintenance After Age 54 After age 38, you are at a higher risk for certain long-term diseases and infections as well as injuries from falls. Falls are a major cause of broken bones and head injuries in people who are older than age 26. Getting regular preventive care can help to keep you healthy and well. Preventive care includes getting regular testing and making lifestyle changes as recommended by your health care provider. Talk with your health care provider about: Which screenings and tests you should have. A screening is a test that checks for a disease when you have no symptoms. A diet and exercise plan that is right for you. What should I know about screenings and tests to prevent falls? Screening and testing are the best ways to find a health problem early. Early diagnosis and treatment give you the best chance of managing medical conditions that are common after age 10. Certain conditions and lifestyle choices may make you more likely to have a fall. Your health care provider may recommend: Regular vision checks. Poor vision and conditions such as cataracts can make you more likely to have a fall. If you wear glasses, make sure to get  your prescription updated if your vision changes. Medicine review. Work with your health care provider to regularly review all of the medicines you are taking, including over-the-counter medicines. Ask your health care provider about any side effects that may make you more likely to have a fall. Tell your health care provider if any medicines that you take make you feel dizzy or sleepy. Strength and balance checks. Your health care provider may recommend certain tests to check your strength and balance while standing, walking, or changing positions. Foot health exam. Foot pain and numbness, as well as not wearing proper  footwear, can make you more likely to have a fall. Screenings, including: Osteoporosis screening. Osteoporosis is a condition that causes the bones to get weaker and break more easily. Blood pressure screening. Blood pressure changes and medicines to control blood pressure can make you feel dizzy. Depression screening. You may be more likely to have a fall if you have a fear of falling, feel depressed, or feel unable to do activities that you used to do. Alcohol use screening. Using too much alcohol can affect your balance and may make you more likely to have a fall. Follow these instructions at home: Lifestyle Do not drink alcohol if: Your health care provider tells you not to drink. If you drink alcohol: Limit how much you have to: 0-1 drink a day for women. 0-2 drinks a day for men. Know how much alcohol is in your drink. In the [place] one drink equals one 12 oz bottle of beer (355 mL), one 5 oz glass of wine (148 mL), or one 1 oz glass of hard liquor (44 mL). Do not use any products that contain nicotine or tobacco. These products include cigarettes, chewing tobacco, and vaping devices, such as e-cigarettes. If you need help quitting, ask your health care provider. Activity  Follow a regular exercise program to stay fit. This will help you maintain your balance. Ask your health care provider what types of exercise are appropriate for you. If you need a cane or walker, use it as recommended by your health care provider. Wear supportive shoes that have nonskid soles. Safety  Remove any tripping hazards, such as rugs, cords, and clutter. Install safety equipment such as grab bars in bathrooms and safety rails on stairs. Keep rooms and walkways well-lit. General instructions Talk with your health care provider about your risks for falling. Tell your health care provider if: You fall. Be sure to tell your health care provider about all falls, even ones that seem minor. You feel dizzy,  tiredness (fatigue), or off-balance. Take over-the-counter and prescription medicines only as told by your health care provider. These include supplements. Eat a healthy diet and maintain a healthy weight. A healthy diet includes low-fat dairy products, low-fat (lean) meats, and fiber from whole grains, beans, and lots of fruits and vegetables. Stay current with your vaccines. Schedule regular health, dental, and eye exams. Summary Having a healthy lifestyle and getting preventive care can help to protect your health and wellness after age 48. Screening and testing are the best way to find a health problem early and help you avoid having a fall. Early diagnosis and treatment give you the best chance for managing medical conditions that are more common for people who are older than age 2. Falls are a major cause of broken bones and head injuries in people who are older than age 64. Take precautions to prevent a fall at home. Work with your health care provider  to learn what changes you can make to improve your health and wellness and to prevent falls. This information is not intended to replace advice given to you by your health care provider. Make sure you discuss any questions you have with your health care provider. Document Revised: 12/29/2020 Document Reviewed: 12/29/2020 Elsevier Patient Education  2022 [place], MD [facility]

## 2021-08-25 NOTE — Assessment & Plan Note (Signed)
Well-controlled hypertension.  Continue losartan 100 mg and metoprolol tartrate 25 mg twice a day. BP Readings from Last 3 Encounters:  08/25/21 138/76  07/20/21 102/71  05/05/21 130/70  Well-controlled diabetes on metformin. Lab Results  Component Value Date   HGBA1C 5.6 07/08/2021  Diet and nutrition discussed. Follow-up in 3 months.

## 2021-08-25 NOTE — Assessment & Plan Note (Addendum)
Status post reactive polyarthralgia.  Suspected DRESS syndrome. Has history of gout.  Intolerant to allopurinol.  Presently on probenecid. Not taking daily colchicine.  Colchicine did help a lot with the symptoms while at the hospital.  Has follow-up appointment with rheumatologist in the next 2 weeks. No longer on steroids.

## 2021-09-04 ENCOUNTER — Encounter: Payer: Self-pay | Admitting: Emergency Medicine

## 2021-09-21 ENCOUNTER — Ambulatory Visit (INDEPENDENT_AMBULATORY_CARE_PROVIDER_SITE_OTHER): Payer: Medicare Other | Admitting: Rheumatology

## 2021-09-21 ENCOUNTER — Encounter: Payer: Self-pay | Admitting: Rheumatology

## 2021-09-21 ENCOUNTER — Other Ambulatory Visit: Payer: Self-pay

## 2021-09-21 VITALS — BP 136/85 | HR 64 | Ht 63.0 in | Wt 176.6 lb

## 2021-09-21 DIAGNOSIS — M0579 Rheumatoid arthritis with rheumatoid factor of multiple sites without organ or systems involvement: Secondary | ICD-10-CM | POA: Diagnosis not present

## 2021-09-21 DIAGNOSIS — M19041 Primary osteoarthritis, right hand: Secondary | ICD-10-CM

## 2021-09-21 DIAGNOSIS — M8589 Other specified disorders of bone density and structure, multiple sites: Secondary | ICD-10-CM

## 2021-09-21 DIAGNOSIS — E119 Type 2 diabetes mellitus without complications: Secondary | ICD-10-CM

## 2021-09-21 DIAGNOSIS — M5136 Other intervertebral disc degeneration, lumbar region: Secondary | ICD-10-CM

## 2021-09-21 DIAGNOSIS — M19071 Primary osteoarthritis, right ankle and foot: Secondary | ICD-10-CM

## 2021-09-21 DIAGNOSIS — M109 Gout, unspecified: Secondary | ICD-10-CM

## 2021-09-21 DIAGNOSIS — M19072 Primary osteoarthritis, left ankle and foot: Secondary | ICD-10-CM

## 2021-09-21 DIAGNOSIS — Z79899 Other long term (current) drug therapy: Secondary | ICD-10-CM | POA: Diagnosis not present

## 2021-09-21 DIAGNOSIS — M79672 Pain in left foot: Secondary | ICD-10-CM

## 2021-09-21 DIAGNOSIS — M170 Bilateral primary osteoarthritis of knee: Secondary | ICD-10-CM

## 2021-09-21 DIAGNOSIS — M79641 Pain in right hand: Secondary | ICD-10-CM

## 2021-09-21 DIAGNOSIS — M79671 Pain in right foot: Secondary | ICD-10-CM

## 2021-09-21 DIAGNOSIS — M79642 Pain in left hand: Secondary | ICD-10-CM

## 2021-09-21 DIAGNOSIS — M19042 Primary osteoarthritis, left hand: Secondary | ICD-10-CM

## 2021-09-21 DIAGNOSIS — Z8639 Personal history of other endocrine, nutritional and metabolic disease: Secondary | ICD-10-CM

## 2021-09-21 DIAGNOSIS — I10 Essential (primary) hypertension: Secondary | ICD-10-CM

## 2021-09-21 MED ORDER — HYDROXYCHLOROQUINE SULFATE 200 MG PO TABS: ORAL_TABLET | ORAL | 0 refills | Status: DC

## 2021-09-21 MED ORDER — PREDNISONE 5 MG PO TABS: ORAL_TABLET | ORAL | 0 refills | Status: DC

## 2021-09-21 NOTE — Progress Notes (Signed)
Office Visit Note  Patient: Isabella Dixon             Date of Birth: [birth year]           MRN: [id number]             PCP: Horald Pollen, MD Referring: Horald Pollen, * Visit Date: 09/21/2021 Occupation: '@GUAROCC' @  Subjective:  Pain and swelling in joints  History of Present Illness: Isabella Dixon is a 76 y.o. female with a history of osteoarthritis and gouty arthropathy.  She states in November after mowing the lawn she developed pain and swelling in her right hand and left foot.  She states the swelling got worse to the point she went to the emergency room where she was hospitalized.  She had extensive work-up and also was treated with antibiotics.  At the time of discharge she responded to colchicine and prednisone.  She went to rehab for 13 days.  She states gradually the symptoms have improved.  She still continues to have some redness in her left foot but all other symptoms have improved.  He continues to have swelling in her right hand.  Activities of Daily Living:  Patient reports morning stiffness for a few minutes.   Patient Reports nocturnal pain.  Difficulty dressing/grooming: Denies Difficulty climbing stairs: Denies Difficulty getting out of chair: Denies Difficulty using hands for taps, buttons, cutlery, and/or writing: Reports  Review of Systems  Constitutional:  Positive for fatigue.  HENT:  Negative for mouth sores, mouth dryness and nose dryness.   Eyes:  Positive for dryness. Negative for pain and itching.  Respiratory:  Negative for shortness of breath and difficulty breathing.   Cardiovascular:  Negative for chest pain and palpitations.  Gastrointestinal:  Negative for blood in stool, constipation and diarrhea.  Endocrine: Negative for increased urination.  Genitourinary:  Negative for difficulty urinating.  Musculoskeletal:  Positive for joint pain, joint pain, joint swelling, myalgias, morning stiffness, muscle tenderness and  myalgias.  Skin:  Positive for color change. Negative for rash, redness and sensitivity to sunlight.  Allergic/Immunologic: Negative for susceptible to infections.  Neurological:  Positive for dizziness, headaches and weakness. Negative for numbness and memory loss.  Hematological:  Negative for bruising/bleeding tendency.  Psychiatric/Behavioral:  Negative for depressed mood, confusion and sleep disturbance. The patient is not nervous/anxious.    PMFS History:  Patient Active Problem List   Diagnosis Date Noted   Polyarthralgia 07/17/2021   Osteoporosis 05/05/2021   Primary osteoarthritis of both hands 06/21/2019   Primary osteoarthritis of both feet 06/21/2019   Primary osteoarthritis of both knees 06/21/2019   History of sciatica 09/06/2017   Obesity, diabetes, and hypertension syndrome ([place]) 09/06/2017   History of gout 09/06/2017   Diabetes mellitus type 2 in obese ([place]) 05/13/2015   Hypothyroid 07/25/2012   Gouty arthritis 05/02/2012   Osteoarthritis 05/02/2012   Hypertension associated with diabetes ([place]) 05/02/2012   Hyperlipidemia 05/02/2012    Past Medical History:  Diagnosis Date   Anemia    Arthritis    Diabetes mellitus without complication ([place])    Gout    High cholesterol    Hypertension    Osteoporosis     Family History  Problem Relation Age of Onset   Heart disease Mother    Stroke Mother    Hypertension Mother    Stroke Father    Colon cancer Sister    SIDS Son    Stroke Paternal Grandmother    Cancer  Paternal Grandmother    ALS Brother    Stroke Maternal Grandmother    Cancer Paternal Grandfather    Testicular cancer Son    Down syndrome Son    Heart disease Brother    Past Surgical History:  Procedure Laterality Date   ABDOMINAL HYSTERECTOMY     TUBAL LIGATION     Social History   Social History Narrative   Not on file   Immunization History  Administered Date(s) Administered   Fluad Quad(high Dose 65+) 05/02/2019, 04/30/2020,  05/05/2021   Influenza Split 05/02/2012   Influenza, High Dose Seasonal PF 06/15/2018   Influenza,inj,Quad PF,6+ Mos 05/08/2013, 04/30/2014, 05/13/2015, 05/17/2016, 09/06/2017   PFIZER(Purple Top)SARS-COV-2 Vaccination 09/17/2019, 10/08/2019, 06/05/2020, 06/04/2021   Pneumococcal Conjugate-13 05/13/2015   Pneumococcal Polysaccharide-23 11/01/2018   Tdap 03/14/2008     Objective: Vital Signs: BP 136/85 (BP Location: Left Arm, Patient Position: Sitting, Cuff Size: Normal)    Pulse 64    Ht '5\' 3"'  (1.6 m)    Wt 176 lb 9.6 oz (80.1 kg)    BMI 31.28 kg/m    Physical Exam Vitals and nursing note reviewed.  Constitutional:      Appearance: She is well-developed.  HENT:     Head: Normocephalic and atraumatic.  Eyes:     Conjunctiva/sclera: Conjunctivae normal.  Cardiovascular:     Rate and Rhythm: Normal rate and regular rhythm.     Heart sounds: Normal heart sounds.  Pulmonary:     Effort: Pulmonary effort is normal.     Breath sounds: Normal breath sounds.  Abdominal:     General: Bowel sounds are normal.     Palpations: Abdomen is soft.  Musculoskeletal:     Cervical back: Normal range of motion.  Lymphadenopathy:     Cervical: No cervical adenopathy.  Skin:    General: Skin is warm and dry.     Capillary Refill: Capillary refill takes less than 2 seconds.  Neurological:     Mental Status: She is alert and oriented to person, place, and time.  Psychiatric:        Behavior: Behavior normal.     Musculoskeletal Exam: C-spine was in good range of motion.  Shoulder joints, elbow joints, wrist joints with good range of motion.  She has synovitis in her right hand over MCPs and PIPs as described below.  Hip joints and knee joints with good range of motion.  She discomfort in the knee joints without any warmth swelling or effusion.  She is some tenderness over left ankle joint but no synovitis was noted.  CDAI Exam: CDAI Score: 8.8  Patient Global: 4 mm; Provider Global: 4  mm Swollen: 4 ; Tender: 5  Joint Exam 09/21/2021      Right  Left  Wrist  Swollen Tender     MCP 2  Swollen Tender  Swollen Tender  MCP 3  Swollen Tender     Ankle      Tender     Investigation: No additional findings.  Imaging: No results found.  Recent Labs: Lab Results  Component Value Date   WBC 6.4 08/25/2021   HGB 12.1 08/25/2021   PLT 288.0 08/25/2021   NA 138 08/25/2021   K 3.7 08/25/2021   CL 100 08/25/2021   CO2 30 08/25/2021   GLUCOSE 114 (H) 08/25/2021   BUN 17 08/25/2021   CREATININE 0.67 08/25/2021   BILITOT 0.3 08/25/2021   ALKPHOS 74 08/25/2021   AST 16 08/25/2021   ALT 14  08/25/2021   PROT 7.9 08/25/2021   ALBUMIN 3.8 08/25/2021   CALCIUM 10.0 08/25/2021   GFRAA 86 07/02/2020   Jan 14, 2021 uric acid 4.0, ESR 19, RF 16, anti-CCP negative  Speciality Comments: Allopurinol- diarrhea  Procedures:  No procedures performed Allergies: Patient has no known allergies.   Assessment / Plan:     Visit Diagnoses: Rheumatoid arthritis involving multiple sites with positive rheumatoid factor (HCC)-she had ongoing pain and swelling in her right hand with synovitis of the wrist joints, MCPs and PIPs.  She also has intermittent swelling in her left foot.  Her rheumatoid factor was borderline positive.  She states that her symptoms only respond to prednisone.  Her uric acid has been well controlled.  She has ongoing swelling.  I suspect that she may have rheumatoid arthritis.  Detailed counseling guarding rheumatoid arthritis was provided.  Different treatment options were discussed.  She was in agreement to proceed with hydroxychloroquine.  I will start her on prednisone 10 mg p.o. daily and taper by 2.5 mg weekly because she is diabetic.  She will also be started on hydroxychloroquine 200 mg p.o. twice daily.  We will check labs in a month, 3 months and then every 5 months to monitor for drug toxicity.  She will be sent to ophthalmologist to monitor for ocular  toxicity  Patient was counseled on the purpose, proper use, and adverse effects of hydroxychloroquine including nausea/diarrhea, skin rash, headaches, and sun sensitivity.  Advised patient to wear sunscreen once starting hydroxychloroquine to reduce risk of rash associated with sun sensitivity.  Discussed importance of annual eye exams while on hydroxychloroquine to monitor to ocular toxicity and discussed importance of frequent laboratory monitoring.  Provided patient with eye exam form for baseline ophthalmologic exam.  Reviewed risk for QTC prolongation when used in combination with other QTc prolonging agents (including but not limited to antiarrhythmics, macrolide antibiotics, flouroquinolones, tricyclic antidepressants, citalopram, specific antipsychotics, ondansetron, migraine triptans, and methadone). Provided patient with educational materials on hydroxychloroquine and answered all questions.  Patient consented to hydroxychloroquine. Will upload consent in the media tab.    High risk medication use-her Plaquenil dose will be 200 mg p.o. twice daily Monday to Friday.  Pain in both hands-she has severe osteoarthritis in her hands which causes discomfort.  She is also having pain in her hands due to swelling.  Primary osteoarthritis of both hands  Primary osteoarthritis of both knees-she has chronic discomfort.  Pain in both feet-she complains of intermittent swelling in her left ankle and her left toes.  No synovitis was noted today.  Primary osteoarthritis of both feet-chronic discomfort.  DDD (degenerative disc disease), lumbar-chronic pain.  Gouty arthritis - probenecid 500 mg 3 times daily, Mitigare BID as needed during gout flares.  She could not tolerate allopurinol in the past.  Uric acid is in desirable range.  Osteopenia of multiple sites - DEXA done by PCP 09/28/19 T -2.3.  Calcium rich diet with vitamin D was discussed.  History of hyperlipidemia  Essential  hypertension  Type 2 diabetes mellitus without complication, without long-term current use of insulin ([place])  History of hypothyroidism  Orders: No orders of the defined types were placed in this encounter.  Meds ordered this encounter  Medications   predniSONE (DELTASONE) 5 MG tablet    Sig: Take 2 tab daily x 7 days, then 1.5 tab daily x 7 days, then 1 tab daily x 7 day, then half tab daily x 7 days.  Dispense:  35 tablet    Refill:  0   hydroxychloroquine (PLAQUENIL) 200 MG tablet    Sig: Take 1 tablet by mouth twice daily Monday through Friday only. Do not take on Saturday and Sunday.    Dispense:  120 tablet    Refill:  0     Follow-Up Instructions: Return in about 6 weeks (around 11/02/2021) for Rheumatoid arthritis, Gout.   Bo Merino, MD  Note - This record has been created using Editor, commissioning.  Chart creation errors have been sought, but may not always  have been located. Such creation errors do not reflect on  the standard of medical care.

## 2021-09-21 NOTE — Patient Instructions (Signed)
Rheumatoid Arthritis Rheumatoid arthritis (RA) is a long-term (chronic) disease. RA causes inflammation in your joints. Your joints may feel painful, stiff, swollen, and warm. RA may start slowly. It most often affects the small joints of the hands and feet. It can also affect other parts of the body. Symptoms of RA often come and go. There is no cure for RA, but medicines can help your symptoms. What are the causes? RA is an autoimmune disease. This means that your body's defense system (immune system) attacks healthy parts of your body by mistake. The exact cause of RA is not known. What increases the risk? Being a woman. Having a family history of RA or other diseases like RA. Smoking. Being overweight. Being exposed to pollutants or chemicals. What are the signs or symptoms? Morning stiffness that lasts longer than 30 minutes. This is often the first symptom. Symptoms start slowly. They are often worse in the morning. As RA gets worse, symptoms may include: Pain, stiffness, swelling, warmth, and tenderness in joints on both sides of your body. Loss of energy. Not feeling hungry. Weight loss. A low fever. Dry eyes and a dry mouth. Firm lumps that grow under your skin. Changes in the way your joints look. Changes in the way your joints work. Symptoms vary and they: Often come and go. Sometimes get worse for a period of time. These are called flares. How is this treated?  Treatment may include: Taking good care of yourself. Be sure to rest as needed, eat a healthy diet, and exercise. Medicines. These may include: Pain relievers. Medicines to help with inflammation. Disease-modifying antirheumatic drugs (DMARDs). Medicines called biologic response modifiers. Physical therapy and occupational therapy. Surgery, if joint damage is very bad. Your doctor will work with you to find the best treatments. Follow these instructions at home: Activity Return to your normal activities as  told by your doctor. Ask your doctor what activities are safe for you. Rest when you have a flare. Exercise as told by your doctor. General instructions Take over-the-counter and prescription medicines only as told by your doctor. Keep all follow-up visits as told by your doctor. This is important. Where to find more information [employer] [facility]: www.rheumatology.[place]: www.arthritis.org Contact a doctor if: You have a flare. You have a fever. You have problems because of your medicines. Get help right away if: You have chest pain. You have trouble breathing. You get a hot, painful joint all of a sudden, and it is worse than your normal joint aches. Summary RA is a long-term disease. Symptoms of RA start slowly. They are often worse in the morning. RA causes inflammation in your joints. This information is not intended to replace advice given to you by your health care provider. Make sure you discuss any questions you have with your health care provider. Document Revised: 04/12/2018 Document Reviewed: 04/12/2018 Elsevier Patient Education  2022 [place]. Hydroxychloroquine Tablets What is this medication? HYDROXYCHLOROQUINE (hye drox ee KLOR [place]) treats autoimmune conditions, such as rheumatoid arthritis and lupus. It works by slowing down an overactive immune system. It may also be used to prevent and treat malaria. It works by killing the parasite that causes malaria. It belongs to a group of medications called DMARDs. This medicine may be used for other purposes; ask your health care provider or pharmacist if you have questions. COMMON BRAND NAME(S): Plaquenil, Quineprox What should I tell my care team before I take this medication? They need to know if  you have any of these conditions: Diabetes Eye disease, vision problems G6PD deficiency Heart disease History of irregular heartbeat If you often drink alcohol Kidney disease Liver  disease Porphyria Psoriasis An unusual or allergic reaction to chloroquine, hydroxychloroquine, other medications, foods, dyes, or preservatives Pregnant or trying to get pregnant Breast-feeding How should I use this medication? Take this medication by mouth with a glass of water. Take it as directed on the prescription label. Do not cut, crush or chew this medication. Swallow the tablets whole. Take it with food. Do not take it more than directed. Take all of this medication unless your care team tells you to stop it early. Keep taking it even if you think you are better. Take products with antacids in them at a different time of day than this medication. Take this medication 4 hours before or 4 hours after antacids. Talk to your care team if you have questions. Talk to your care team about the use of this medication in children. While this medication may be prescribed for selected conditions, precautions do apply. Overdosage: If you think you have taken too much of this medicine contact a poison control center or emergency room at once. NOTE: This medicine is only for you. Do not share this medicine with others. What if I miss a dose? If you miss a dose, take it as soon as you can. If it is almost time for your next dose, take only that dose. Do not take double or extra doses. What may interact with this medication? Do not take this medication with any of the following: Cisapride Dronedarone Pimozide Thioridazine This medication may also interact with the following: Ampicillin Antacids Cimetidine Cyclosporine Digoxin [place]olin Medications for diabetes, like insulin, glipizide, glyburide Medications for seizures like carbamazepine, phenobarbital, phenytoin Mefloquine Methotrexate Other medications that prolong the QT interval (cause an abnormal heart rhythm) Praziquantel This list may not describe all possible interactions. Give your health care provider a list of all the medicines,  herbs, non-prescription drugs, or dietary supplements you use. Also tell them if you smoke, drink alcohol, or use illegal drugs. Some items may interact with your medicine. What should I watch for while using this medication? Visit your care team for regular checks on your progress. Tell your care team if your symptoms do not start to get better or if they get worse. You may need blood work done while you are taking this medication. If you take other medications that can affect heart rhythm, you may need more testing. Talk to your care team if you have questions. Your vision may be tested before and during use of this medication. Tell your care team right away if you have any change in your eyesight. This medication may cause serious skin reactions. They can happen weeks to months after starting the medication. Contact your care team right away if you notice fevers or flu-like symptoms with a rash. The rash may be red or purple and then turn into blisters or peeling of the skin. Or, you might notice a red rash with swelling of the face, lips or lymph nodes in your neck or under your arms. If you or your family notice any changes in your behavior, such as new or worsening depression, thoughts of harming yourself, anxiety, or other unusual or disturbing thoughts, or memory loss, call your care team right away. What side effects may I notice from receiving this medication? Side effects that you should report to your care team as soon as  possible: Allergic reactions--skin rash, itching, hives, swelling of the face, lips, tongue, or throat Aplastic anemia--unusual weakness or fatigue, dizziness, headache, trouble breathing, increased bleeding or bruising Change in vision Heart rhythm changes--fast or irregular heartbeat, dizziness, feeling faint or lightheaded, chest pain, trouble breathing Infection--fever, chills, cough, or sore throat Low blood sugar (hypoglycemia)--tremors or shaking, anxiety, sweating,  cold or clammy skin, confusion, dizziness, rapid heartbeat Muscle injury--unusual weakness or fatigue, muscle pain, dark yellow or brown urine, decrease in amount of urine Pain, tingling, or numbness in the hands or feet Rash, fever, and swollen lymph nodes Redness, blistering, peeling, or loosening of the skin, including inside the mouth Thoughts of suicide or self-harm, worsening mood, or feelings of depression Unusual bruising or bleeding Side effects that usually do not require medical attention (report to your care team if they continue or are bothersome): Diarrhea Headache Nausea Stomach pain Vomiting This list may not describe all possible side effects. Call your doctor for medical advice about side effects. You may report side effects to FDA at 1-800-FDA-1088. Where should I keep my medication? Keep out of the reach of children and pets. Store at room temperature up to 30 degrees C (86 degrees F). Protect from light. Get rid of any unused medication after the expiration date. To get rid of medications that are no longer needed or have expired: Take the medication to a medication take-back program. Check with your pharmacy or law enforcement to find a location. If you cannot return the medication, check the label or package insert to see if the medication should be thrown out in the garbage or flushed down the toilet. If you are not sure, ask your care team. If it is safe to put it in the trash, empty the medication out of the container. Mix the medication with cat litter, dirt, coffee grounds, or other unwanted substance. Seal the mixture in a bag or container. Put it in the trash. NOTE: This sheet is a summary. It may not cover all possible information. If you have questions about this medicine, talk to your doctor, pharmacist, or health care provider.  2022 Elsevier/Gold Standard (2020-12-25 00:00:00)  Standing Labs We placed an order today for your standing lab work.   Please  have your standing labs drawn in 1 month, 3 months and then every 5 months  If possible, please have your labs drawn 2 weeks prior to your appointment so that the provider can discuss your results at your appointment.  Please note that you may see your imaging and lab results in MyChart before we have reviewed them. We may be awaiting multiple results to interpret others before contacting you. Please allow our office up to 72 hours to thoroughly review all of the results before contacting the office for clarification of your results.  We have open lab daily: Monday through Thursday from 1:30-4:30 PM and Friday from 1:30-4:00 PM at the office of Dr. Pollyann Savoy, [facility].   Please be advised, all patients with office appointments requiring lab work will take precedent over walk-in lab work.  If possible, please come for your lab work on Monday and Friday afternoons, as you may experience shorter wait times. The office is located at [address], [address], [place], [place] [address] No appointment is necessary.   Labs are drawn by [facility]. Please bring your co-pay at the time of your lab draw.  You may receive a bill from [facility] for your lab work.  Please note if you  are on Hydroxychloroquine and and an order has been placed for a Hydroxychloroquine level, you will need to have it drawn 4 hours or more after your last dose.  If you wish to have your labs drawn at another location, please call the office 24 hours in advance to send orders.  If you have any questions regarding directions or hours of operation,  please call [phone].   As a reminder, please drink plenty of water prior to coming for your lab work. Thanks!   Vaccines You are taking a medication(s) that can suppress your immune system.  The following immunizations are recommended: Flu annually Covid-19  Td/Tdap (tetanus, diphtheria, pertussis) every 10 years Pneumonia (Prevnar 15 then Pneumovax 23 at  least 1 year apart.  Alternatively, can take Prevnar 20 without needing additional dose) Shingrix: 2 doses from 4 weeks to 6 months apart  Please check with your PCP to make sure you are up to date.

## 2021-09-21 NOTE — Progress Notes (Signed)
Pharmacy Note  Subjective: Patient presents today to [facility] for follow up office visit.   Patient seen by the pharmacist for counseling on hydroxychloroquine for rheumatoid arthritis.    Objective: CMP     Component Value Date/Time   NA 138 08/25/2021 1516   NA 141 10/28/2020 0953   K 3.7 08/25/2021 1516   CL 100 08/25/2021 1516   CO2 30 08/25/2021 1516   GLUCOSE 114 (H) 08/25/2021 1516   BUN 17 08/25/2021 1516   BUN 12 10/28/2020 0953   CREATININE 0.67 08/25/2021 1516   CREATININE 0.79 07/02/2020 1017   CALCIUM 10.0 08/25/2021 1516   PROT 7.9 08/25/2021 1516   PROT 8.1 10/28/2020 0953   ALBUMIN 3.8 08/25/2021 1516   ALBUMIN 4.5 10/28/2020 0953   AST 16 08/25/2021 1516   ALT 14 08/25/2021 1516   ALKPHOS 74 08/25/2021 1516   BILITOT 0.3 08/25/2021 1516   BILITOT 0.4 10/28/2020 0953   GFRNONAA >60 07/12/2021 0129   GFRNONAA 74 07/02/2020 1017   GFRAA 86 07/02/2020 1017    CBC    Component Value Date/Time   WBC 6.4 08/25/2021 1516   RBC 4.03 08/25/2021 1516   HGB 12.1 08/25/2021 1516   HGB 13.6 10/28/2020 0953   HCT 36.6 08/25/2021 1516   HCT 42.2 10/28/2020 0953   PLT 288.0 08/25/2021 1516   PLT 212 10/28/2020 0953   MCV 90.7 08/25/2021 1516   MCV 92 10/28/2020 0953   MCH 30.1 07/12/2021 0129   MCHC 33.0 08/25/2021 1516   RDW 14.4 08/25/2021 1516   RDW 13.2 10/28/2020 0953   LYMPHSABS 3.0 08/25/2021 1516   LYMPHSABS 2.5 10/28/2020 0953   MONOABS 0.7 08/25/2021 1516   EOSABS 0.1 08/25/2021 1516   EOSABS 0.1 10/28/2020 0953   BASOSABS 0.0 08/25/2021 1516   BASOSABS 0.1 10/28/2020 0953    Assessment/Plan: Patient was counseled on the purpose, proper use, and adverse effects of hydroxychloroquine including nausea/diarrhea, skin rash, headaches, and sun sensitivity.  Advised patient to wear sunscreen once starting hydroxychloroquine to reduce risk of rash associated with sun sensitivity.  Discussed importance of annual eye exams while on  hydroxychloroquine to monitor to ocular toxicity and discussed importance of frequent laboratory monitoring.  Provided patient with eye exam form for baseline ophthalmologic exam.  Reviewed risk for QTC prolongation when used in combination with other QTc prolonging agents (including but not limited to antiarrhythmics, macrolide antibiotics, flouroquinolones, tricyclic antidepressants, citalopram, specific antipsychotics, ondansetron, migraine triptans, and methadone). Provided patient with educational materials on hydroxychloroquine and answered all questions.  Patient consented to hydroxychloroquine. Will upload consent in the media tab.    Dose will be Plaquenil 200 mg twice daily Monday through Friday.    She will take prednisone taper - 10mg  daily x 7 days, 7.5mg  daily x 7 days, 5mg  daily x 7 days, then 2.5mg  daily x 7 days  , PharmD, MPH, BCPS Clinical Pharmacist (Rheumatology and Pulmonology)

## 2021-09-24 ENCOUNTER — Telehealth: Payer: Self-pay | Admitting: Rheumatology

## 2021-09-24 NOTE — Telephone Encounter (Signed)
PLQ Eye exam form faxed as requested.

## 2021-09-24 NOTE — Telephone Encounter (Signed)
Patient called the office stating she needed a Plaquenil eye exam form faxed to [facility]. Patient states we told her she could pick up a form from our office to take. Patient states Dione Booze wants it faxed.

## 2021-10-19 NOTE — Progress Notes (Signed)
Office Visit Note  Patient: Isabella Dixon             Date of Birth: 1946-02-03           MRN: [id number]             PCP: Georgina Quint, MD Referring: Georgina Quint, * Visit Date: 11/02/2021 Occupation: @GUAROCC @  Subjective:  Medication monitoring  History of Present Illness: Isabella Dixon is a 76 y.o. female with history of seropositive rheumatoid arthritis, osteoarthritis, DDD.  Patient is taking Plaquenil 200 mg 1 tablet by mouth twice daily Monday through Friday.  The patient was started on Plaquenil after her last office visit on 09/21/2021.  She was also given a prednisone taper starting at 10 mg tapering by 2.5 mg every week.  She has noticed about 75% improvement in her joint pain and stiffness since initiating Plaquenil.  She has been going to occupational therapy which also has improved her symptoms.  She notices improvement after massage as well as using paraffin wax.    Activities of Daily Living:  Patient reports morning stiffness for 5-10 minutes.   Patient Reports nocturnal pain.  Difficulty dressing/grooming: Denies Difficulty climbing stairs: Denies Difficulty getting out of chair: Denies Difficulty using hands for taps, buttons, cutlery, and/or writing: Reports  Review of Systems  Constitutional:  Positive for fatigue.  HENT:  Negative for mouth sores, mouth dryness and nose dryness.   Eyes:  Positive for dryness. Negative for pain and itching.  Respiratory:  Negative for shortness of breath and difficulty breathing.   Cardiovascular:  Negative for chest pain and palpitations.  Gastrointestinal:  Negative for blood in stool, constipation and diarrhea.  Endocrine: Negative for increased urination.  Genitourinary:  Negative for difficulty urinating.  Musculoskeletal:  Positive for joint pain, joint pain, joint swelling, myalgias, morning stiffness, muscle tenderness and myalgias.  Skin:  Positive for redness. Negative for color  change and rash.  Allergic/Immunologic: Negative for susceptible to infections.  Neurological:  Positive for dizziness, numbness and weakness. Negative for headaches and memory loss.  Hematological:  Negative for bruising/bleeding tendency.  Psychiatric/Behavioral:  Negative for confusion.    PMFS History:  Patient Active Problem List   Diagnosis Date Noted   Polyarthralgia 07/17/2021   Osteoporosis 05/05/2021   Primary osteoarthritis of both hands 06/21/2019   Primary osteoarthritis of both feet 06/21/2019   Primary osteoarthritis of both knees 06/21/2019   History of sciatica 09/06/2017   Obesity, diabetes, and hypertension syndrome (HCC) 09/06/2017   History of gout 09/06/2017   Diabetes mellitus type 2 in obese (HCC) 05/13/2015   Hypothyroid 07/25/2012   Gouty arthritis 05/02/2012   Osteoarthritis 05/02/2012   Hypertension associated with diabetes (HCC) 05/02/2012   Hyperlipidemia 05/02/2012    Past Medical History:  Diagnosis Date   Anemia    Arthritis    Diabetes mellitus without complication (HCC)    Gout    High cholesterol    Hypertension    Osteoporosis     Family History  Problem Relation Age of Onset   Heart disease Mother    Stroke Mother    Hypertension Mother    Stroke Father    Colon cancer Sister    SIDS Son    Stroke Paternal Grandmother    Cancer Paternal Grandmother    ALS Brother    Stroke Maternal Grandmother    Cancer Paternal Grandfather    Testicular cancer Son    Down syndrome Son  Heart disease Brother    Past Surgical History:  Procedure Laterality Date   ABDOMINAL HYSTERECTOMY     TUBAL LIGATION     Social History   Social History Narrative   Not on file   Immunization History  Administered Date(s) Administered   Fluad Quad(high Dose 65+) 05/02/2019, 04/30/2020, 05/05/2021   Influenza Split 05/02/2012   Influenza, High Dose Seasonal PF 06/15/2018   Influenza,inj,Quad PF,6+ Mos 05/08/2013, 04/30/2014, 05/13/2015,  05/17/2016, 09/06/2017   PFIZER(Purple Top)SARS-COV-2 Vaccination 09/17/2019, 10/08/2019, 06/05/2020, 06/04/2021   Pneumococcal Conjugate-13 05/13/2015   Pneumococcal Polysaccharide-23 11/01/2018   Tdap 03/14/2008     Objective: Vital Signs: BP 132/83 (BP Location: Left Arm, Patient Position: Sitting, Cuff Size: Normal)   Pulse 60   Ht 5\' 3"  (1.6 m)   Wt 180 lb 9.6 oz (81.9 kg)   BMI 31.99 kg/m    Physical Exam Vitals and nursing note reviewed.  Constitutional:      Appearance: She is well-developed.  HENT:     Head: Normocephalic and atraumatic.  Eyes:     Conjunctiva/sclera: Conjunctivae normal.  Cardiovascular:     Rate and Rhythm: Normal rate and regular rhythm.     Heart sounds: Normal heart sounds.  Pulmonary:     Effort: Pulmonary effort is normal.     Breath sounds: Normal breath sounds.  Abdominal:     General: Bowel sounds are normal.     Palpations: Abdomen is soft.  Musculoskeletal:     Cervical back: Normal range of motion.  Skin:    General: Skin is warm and dry.     Capillary Refill: Capillary refill takes less than 2 seconds.  Neurological:     Mental Status: She is alert and oriented to person, place, and time.  Psychiatric:        Behavior: Behavior normal.     Musculoskeletal Exam: C-spine has good ROM with some discomfort with lateral rotation.  Shoulder joints and elbow joints have good ROM.  Tenderness and synovitis of the right wrist.  PIP and DIP thickening consistent with osteoarthritis of both hands. Hip joints, knee joints, and ankle joints have good ROM with no discomfort.  No warmth or effusion of knee joints.  No tenderness or swelling of ankle joints.   CDAI Exam: CDAI Score: 4.6  Patient Global: 3 mm; Provider Global: 3 mm Swollen: 1 ; Tender: 4  Joint Exam 11/02/2021      Right  Left  Wrist  Swollen Tender     CMC   Tender     MCP 2   Tender     MCP 3   Tender        Investigation: No additional findings.  Imaging: No  results found.  Recent Labs: Lab Results  Component Value Date   WBC 6.4 08/25/2021   HGB 12.1 08/25/2021   PLT 288.0 08/25/2021   NA 138 08/25/2021   K 3.7 08/25/2021   CL 100 08/25/2021   CO2 30 08/25/2021   GLUCOSE 114 (H) 08/25/2021   BUN 17 08/25/2021   CREATININE 0.67 08/25/2021   BILITOT 0.3 08/25/2021   ALKPHOS 74 08/25/2021   AST 16 08/25/2021   ALT 14 08/25/2021   PROT 7.9 08/25/2021   ALBUMIN 3.8 08/25/2021   CALCIUM 10.0 08/25/2021   GFRAA 86 07/02/2020    Speciality Comments: Allopurinol- diarrhea  Procedures:  No procedures performed Allergies: Patient has no known allergies.   Assessment / Plan:     Visit Diagnoses: Rheumatoid  arthritis involving multiple sites with positive rheumatoid factor (HCC): RF+, responsive to prednisone use: she has ongoing tenderness and inflammation in the right wrist joint.  Tenderness over the right second and third MCP joints but no synovitis was noted.  The patient was started on Plaquenil 200 mg 1 tablet by mouth twice daily Monday through Friday after her last office visit on 09/21/2021.  She has been tolerating Plaquenil and has not missed any doses recently.  She was given a prednisone taper starting at 10 mg tapering by 2.5 mg every week after her last office visit which alleviated her flare. She has noticed about a 75% improvement in her joint pain and inflammation since starting on Plaquenil and going to occupational therapy.  She continues to experience intermittent pain and stiffness in both hands but overall her joint swelling has improved.  She will remain on Plaquenil as prescribed.  She was advised to notify us if she develops any new or worsening symptoms.  She will follow-up in the office in 2 to 3 months to assess her full response to Plaquenil.  High risk medication use - Plaquenil 200 mg 1 tablet by mouth twice daily Monday to Friday. No baseline Plaquenil eye examination on file.  She plans on going to [facility] eye care  today to schedule an appointment.  CBC and CMP drawn on 08/25/2021.  She is due to update lab work today.  Orders for CBC and CMP were released.  Her next lab work will be due in 3 months and every 5 months to monitor for drug toxicity.  - Plan: CBC with Differential/Platelet, COMPLETE METABOLIC PANEL WITH GFR  Primary osteoarthritis of both hands: She has PIP and DIP thickening consistent with osteoarthritis of both hands.  She has tenderness and thickening over the right CMC joint.  Discussed the importance of joint protection and muscle strengthening.  Primary osteoarthritis of both knees: Chronic pain and stiffness.  She has good range of motion of both knee joints on examination today.  No warmth or effusion was noted.  Primary osteoarthritis of both feet: Chronic pain.  Intermittent inflammation in the left foot.  She is wearing proper fitting shoes.  Overall the pain of her feet has started to improve.  DDD (degenerative disc disease), lumbar: Chronic pain.  No symptoms of radiculopathy at this time.  Gouty arthritis - She takes probenecid 500 mg 3 times daily and Mitigare 0.6 mg BID as needed during gout flares.  She could not tolerate allopurinol in the past. Uric acid was 6.2 on 07/09/21.    Osteopenia of multiple sites - DEXA done by PCP 09/28/19 T -2.3.  Order for DEXA remains in place. She is taking vitamin D 2000 units daily.    Other medical conditions are listed as follows:   History of hyperlipidemia  Type 2 diabetes mellitus without complication, without long-term current use of insulin (HCC)  Essential hypertension: BP was 132/83 today in the office.  History of hypothyroidism  Orders: Orders Placed This Encounter  Procedures   CBC with Differential/Platelet   COMPLETE METABOLIC PANEL WITH GFR   No orders of the defined types were placed in this encounter.    Follow-Up Instructions: Return in 3 months (on 02/02/2022) for Rheumatoid arthritis, Osteoarthritis, Gout,  DDD.   Gearldine Bienenstock, PA-C  Note - This record has been created using Dragon software.  Chart creation errors have been sought, but may not always  have been located. Such creation errors do not reflect  on  the standard of medical care.

## 2021-10-24 ENCOUNTER — Other Ambulatory Visit: Payer: Self-pay | Admitting: Rheumatology

## 2021-10-24 DIAGNOSIS — M79642 Pain in left hand: Secondary | ICD-10-CM

## 2021-10-24 DIAGNOSIS — M79641 Pain in right hand: Secondary | ICD-10-CM

## 2021-10-24 DIAGNOSIS — M0579 Rheumatoid arthritis with rheumatoid factor of multiple sites without organ or systems involvement: Secondary | ICD-10-CM

## 2021-10-24 DIAGNOSIS — M79671 Pain in right foot: Secondary | ICD-10-CM

## 2021-10-26 ENCOUNTER — Telehealth: Payer: Self-pay | Admitting: Rheumatology

## 2021-10-26 NOTE — Telephone Encounter (Signed)
LMOM PCP prescribes this medication. ?

## 2021-10-26 NOTE — Telephone Encounter (Signed)
Patient called the office requesting a refill of Probenecid 500mg  to be sent to [facility]. ?

## 2021-10-26 NOTE — Telephone Encounter (Signed)
Next Visit: 11/02/2021 ? ?Last Visit: 09/21/2021 ? ?Labs: 08/25/2021 Neutrophils Relative 40.3, Lymphocytes 47.1, Glucose 114 ? ?Eye exam: not on file.  ? ?Current Dose per office note 09/21/2021: Plaquenil dose will be 200 mg p.o. twice daily Monday to Friday. ? ?GY:IRSWNIOEVO arthritis involving multiple sites with positive rheumatoid factor ? ?Last Fill: 09/21/2021 ? ?Left message to advise patient we are in need of her PLQ eye exam.  ? ?Okay to refill Plaquenil?  ?

## 2021-11-02 ENCOUNTER — Encounter: Payer: Self-pay | Admitting: Physician Assistant

## 2021-11-02 ENCOUNTER — Other Ambulatory Visit: Payer: Self-pay | Admitting: Physician Assistant

## 2021-11-02 ENCOUNTER — Telehealth: Payer: Self-pay

## 2021-11-02 ENCOUNTER — Ambulatory Visit (INDEPENDENT_AMBULATORY_CARE_PROVIDER_SITE_OTHER): Payer: Medicare Other | Admitting: Physician Assistant

## 2021-11-02 ENCOUNTER — Other Ambulatory Visit: Payer: Self-pay

## 2021-11-02 VITALS — BP 132/83 | HR 60 | Ht 63.0 in | Wt 180.6 lb

## 2021-11-02 DIAGNOSIS — Z79899 Other long term (current) drug therapy: Secondary | ICD-10-CM | POA: Diagnosis not present

## 2021-11-02 DIAGNOSIS — I10 Essential (primary) hypertension: Secondary | ICD-10-CM

## 2021-11-02 DIAGNOSIS — M79672 Pain in left foot: Secondary | ICD-10-CM

## 2021-11-02 DIAGNOSIS — M5136 Other intervertebral disc degeneration, lumbar region: Secondary | ICD-10-CM

## 2021-11-02 DIAGNOSIS — M170 Bilateral primary osteoarthritis of knee: Secondary | ICD-10-CM | POA: Diagnosis not present

## 2021-11-02 DIAGNOSIS — M19041 Primary osteoarthritis, right hand: Secondary | ICD-10-CM

## 2021-11-02 DIAGNOSIS — M0579 Rheumatoid arthritis with rheumatoid factor of multiple sites without organ or systems involvement: Secondary | ICD-10-CM

## 2021-11-02 DIAGNOSIS — M19042 Primary osteoarthritis, left hand: Secondary | ICD-10-CM

## 2021-11-02 DIAGNOSIS — M8589 Other specified disorders of bone density and structure, multiple sites: Secondary | ICD-10-CM

## 2021-11-02 DIAGNOSIS — M19072 Primary osteoarthritis, left ankle and foot: Secondary | ICD-10-CM

## 2021-11-02 DIAGNOSIS — Z8639 Personal history of other endocrine, nutritional and metabolic disease: Secondary | ICD-10-CM

## 2021-11-02 DIAGNOSIS — M79641 Pain in right hand: Secondary | ICD-10-CM

## 2021-11-02 DIAGNOSIS — M79671 Pain in right foot: Secondary | ICD-10-CM

## 2021-11-02 DIAGNOSIS — E119 Type 2 diabetes mellitus without complications: Secondary | ICD-10-CM

## 2021-11-02 DIAGNOSIS — M19071 Primary osteoarthritis, right ankle and foot: Secondary | ICD-10-CM

## 2021-11-02 DIAGNOSIS — M109 Gout, unspecified: Secondary | ICD-10-CM

## 2021-11-02 NOTE — Telephone Encounter (Signed)
Opened in error

## 2021-11-03 LAB — CBC WITH DIFFERENTIAL/PLATELET
Absolute Monocytes: 552 cells/uL (ref 200–950)
Basophils Absolute: 42 cells/uL (ref 0–200)
Basophils Relative: 0.7 %
Eosinophils Absolute: 132 cells/uL (ref 15–500)
Eosinophils Relative: 2.2 %
HCT: 39.7 % (ref 35.0–45.0)
Hemoglobin: 13 g/dL (ref 11.7–15.5)
Lymphs Abs: 2634 cells/uL (ref 850–3900)
MCH: 30 pg (ref 27.0–33.0)
MCHC: 32.7 g/dL (ref 32.0–36.0)
MCV: 91.5 fL (ref 80.0–100.0)
MPV: 11 fL (ref 7.5–12.5)
Monocytes Relative: 9.2 %
Neutro Abs: 2640 cells/uL (ref 1500–7800)
Neutrophils Relative %: 44 %
Platelets: 212 10*3/uL (ref 140–400)
RBC: 4.34 10*6/uL (ref 3.80–5.10)
RDW: 13 % (ref 11.0–15.0)
Total Lymphocyte: 43.9 %
WBC: 6 10*3/uL (ref 3.8–10.8)

## 2021-11-03 LAB — COMPLETE METABOLIC PANEL WITH GFR
AG Ratio: 1.1 (calc) (ref 1.0–2.5)
ALT: 14 U/L (ref 6–29)
AST: 19 U/L (ref 10–35)
Albumin: 4.2 g/dL (ref 3.6–5.1)
Alkaline phosphatase (APISO): 76 U/L (ref 37–153)
BUN: 17 mg/dL (ref 7–25)
CO2: 25 mmol/L (ref 20–32)
Calcium: 9.3 mg/dL (ref 8.6–10.4)
Chloride: 102 mmol/L (ref 98–110)
Creat: 0.83 mg/dL (ref 0.60–1.00)
Globulin: 3.7 g/dL (calc) (ref 1.9–3.7)
Glucose, Bld: 89 mg/dL (ref 65–99)
Potassium: 4.6 mmol/L (ref 3.5–5.3)
Sodium: 140 mmol/L (ref 135–146)
Total Bilirubin: 0.4 mg/dL (ref 0.2–1.2)
Total Protein: 7.9 g/dL (ref 6.1–8.1)
eGFR: 74 mL/min/{1.73_m2} (ref >=60)

## 2021-11-03 NOTE — Progress Notes (Signed)
CBC and CMP WNL

## 2021-11-04 ENCOUNTER — Encounter: Payer: Self-pay | Admitting: Emergency Medicine

## 2021-11-04 ENCOUNTER — Other Ambulatory Visit: Payer: Self-pay

## 2021-11-04 ENCOUNTER — Ambulatory Visit (INDEPENDENT_AMBULATORY_CARE_PROVIDER_SITE_OTHER): Payer: Medicare Other | Admitting: Emergency Medicine

## 2021-11-04 VITALS — BP 118/78 | HR 64 | Ht 63.0 in | Wt 181.0 lb

## 2021-11-04 DIAGNOSIS — M159 Polyosteoarthritis, unspecified: Secondary | ICD-10-CM | POA: Diagnosis not present

## 2021-11-04 DIAGNOSIS — E785 Hyperlipidemia, unspecified: Secondary | ICD-10-CM

## 2021-11-04 DIAGNOSIS — I152 Hypertension secondary to endocrine disorders: Secondary | ICD-10-CM

## 2021-11-04 DIAGNOSIS — M0579 Rheumatoid arthritis with rheumatoid factor of multiple sites without organ or systems involvement: Secondary | ICD-10-CM | POA: Diagnosis not present

## 2021-11-04 DIAGNOSIS — E1169 Type 2 diabetes mellitus with other specified complication: Secondary | ICD-10-CM | POA: Diagnosis not present

## 2021-11-04 DIAGNOSIS — E039 Hypothyroidism, unspecified: Secondary | ICD-10-CM

## 2021-11-04 DIAGNOSIS — E1159 Type 2 diabetes mellitus with other circulatory complications: Secondary | ICD-10-CM | POA: Diagnosis not present

## 2021-11-04 NOTE — Assessment & Plan Note (Signed)
Stable.  Recently seen by rheumatologist and started on hydroxychloroquine.  Tolerating it well.  Was also started on prednisone.  Tolerated well also. ?

## 2021-11-04 NOTE — Progress Notes (Signed)
Isabella Dixon ?76 y.o. ? ? ?Chief Complaint  ?Patient presents with  ? Hypertension  ?  F/u  ? Hospitalization Follow-up  ?  gout  ? ? ?HISTORY OF PRESENT ILLNESS: ?This is a 76 y.o. female with history of hypertension here for follow-up. ?Doing well.  Much better.  Has no complaints or medical concerns today. ?BP Readings from Last 3 Encounters:  ?11/04/21 118/78  ?11/02/21 132/83  ?09/21/21 136/85  ?Recently sAssessment / Plan:     ?Visit Diagnoses: Rheumatoid arthritis involving multiple sites with positive rheumatoid factor (HCC)-she had ongoing pain and swelling in her right hand with synovitis of the wrist joints, MCPs and PIPs.  She also has intermittent swelling in her left foot.  Her rheumatoid factor was borderline positive.  She states that her symptoms only respond to prednisone.  Her uric acid has been well controlled.  She has ongoing swelling.  I suspect that she may have rheumatoid arthritis.  Detailed counseling guarding rheumatoid arthritis was provided.  Different treatment options were discussed.  She was in agreement to proceed with hydroxychloroquine.  I will start her on prednisone 10 mg p.o. daily and taper by 2.5 mg weekly because she is diabetic.  She will also be started on hydroxychloroquine 200 mg p.o. twice daily.  We will check labs in a month, 3 months and then every 5 months to monitor for drug toxicity.  She will be sent to ophthalmologist to monitor for ocular toxicity ? een by rheumatologist, assessment and plan as follows: ?Pharmacist assessment and plan as follows: ?Assessment/Plan: ?Patient was counseled on the purpose, proper use, and adverse effects of hydroxychloroquine including nausea/diarrhea, skin rash, headaches, and sun sensitivity.  Advised patient to wear sunscreen once starting hydroxychloroquine to reduce risk of rash associated with sun sensitivity.  Discussed importance of annual eye exams while on hydroxychloroquine to monitor to ocular toxicity and  discussed importance of frequent laboratory monitoring.  Provided patient with eye exam form for baseline ophthalmologic exam.  Reviewed risk for QTC prolongation when used in combination with other QTc prolonging agents (including but not limited to antiarrhythmics, macrolide antibiotics, flouroquinolones, tricyclic antidepressants, citalopram, specific antipsychotics, ondansetron, migraine triptans, and methadone). Provided patient with educational materials on hydroxychloroquine and answered all questions.  Patient consented to hydroxychloroquine. Will upload consent in the media tab.   ?  ?Dose will be Plaquenil 200 mg twice daily Monday through Friday.   ?  ?She will take prednisone taper - 10mg  daily x 7 days, 7.5mg  daily x 7 days, 5mg  daily x 7 days, then 2.5mg  daily x 7 days ?  ? , PharmD, MPH, BCPS ?Clinical Pharmacist (Rheumatology and Pulmonology) ?  ? ? ?HPI ? ? ?Prior to Admission medications   ?Medication Sig Start Date End Date Taking? Authorizing Provider  ?chlorpheniramine (CHLOR-TRIMETON) 4 MG tablet Take 4 mg by mouth 2 (two) times daily as needed for allergies.   Yes [provider]  ?Cholecalciferol (VITAMIN D-3 PO) Take 2,000 Units by mouth daily.   Yes [provider]  ?colchicine 0.6 MG tablet Take 2 tablets (1.2 mg total) by mouth 2 (two) times daily. ?Patient taking differently: Take 1.2 mg by mouth as needed. 07/20/21  Yes Ghimire, Chesley Mires, MD  ?hydroxychloroquine (PLAQUENIL) 200 MG tablet TAKE 1 TABLET TWICE A DAY  MONDAY THROUGH FRIDAY ONLY.DO NOT TAKE ON SATURDAY ANDSUNDAY 10/26/21  Yes Werner Lean, PA-C  ?levothyroxine (SYNTHROID) 75 MCG tablet Take 1 tablet (75 mcg total) by mouth daily. 05/05/21  11/04/21 Yes Lavarr President, Eilleen KempfMiguel Jose, MD  ?losartan (COZAAR) 100 MG tablet Take 1 tablet (100 mg total) by mouth daily. 05/05/21  Yes Lyndie Vanderloop, Eilleen KempfMiguel Jose, MD  ?metFORMIN (GLUCOPHAGE) 500 MG tablet Take 1 tablet (500 mg total) by mouth 2 (two) times daily with a  meal. 05/05/21 11/04/21 Yes Evoleth Nordmeyer, Eilleen KempfMiguel Jose, MD  ?metoprolol tartrate (LOPRESSOR) 25 MG tablet Take 1 tablet (25 mg total) by mouth 2 (two) times daily. 05/05/21 11/04/21 Yes Malania Gawthrop, Eilleen KempfMiguel Jose, MD  ?Multiple Vitamin (MULTIVITAMIN) tablet Take 1 tablet by mouth daily.   Yes [provider]  ?probenecid (BENEMID) 500 MG tablet Take 1 tablet (500 mg total) by mouth 3 (three) times daily. 05/05/21 11/04/21 Yes Sandford Diop, Eilleen KempfMiguel Jose, MD  ?rosuvastatin (CRESTOR) 10 MG tablet Take 1 tablet (10 mg total) by mouth daily. 05/05/21  Yes Holland Nickson, Eilleen KempfMiguel Jose, MD  ?vitamin C (ASCORBIC ACID) 250 MG tablet Take 250 mg by mouth daily.   Yes [provider]  ? ? ?No Known Allergies ? ?Patient Active Problem List  ? Diagnosis Date Noted  ? Polyarthralgia 07/17/2021  ? Osteoporosis 05/05/2021  ? Primary osteoarthritis of both hands 06/21/2019  ? Primary osteoarthritis of both feet 06/21/2019  ? Primary osteoarthritis of both knees 06/21/2019  ? History of sciatica 09/06/2017  ? Obesity, diabetes, and hypertension syndrome (HCC) 09/06/2017  ? History of gout 09/06/2017  ? Diabetes mellitus type 2 in obese (HCC) 05/13/2015  ? Hypothyroid 07/25/2012  ? Gouty arthritis 05/02/2012  ? Osteoarthritis 05/02/2012  ? Hypertension associated with diabetes (HCC) 05/02/2012  ? Hyperlipidemia 05/02/2012  ? Abnormal Papanicolaou smear of vagina 05/02/2012  ? ? ?Past Medical History:  ?Diagnosis Date  ? Anemia   ? Arthritis   ? Diabetes mellitus without complication (HCC)   ? Gout   ? High cholesterol   ? Hypertension   ? Osteoporosis   ? ? ?Past Surgical History:  ?Procedure Laterality Date  ? ABDOMINAL HYSTERECTOMY    ? TUBAL LIGATION    ? ? ?Social History  ? ?Socioeconomic History  ? Marital status: Married  ?  Spouse name: Not on file  ? Number of children: Not on file  ? Years of education: Not on file  ? Highest education level: Not on file  ?Occupational History  ? Not on file  ?Tobacco Use  ? Smoking status: Never  ?   Passive exposure: Past  ? Smokeless tobacco: Never  ?Vaping Use  ? Vaping Use: Never used  ?Substance and Sexual Activity  ? Alcohol use: No  ? Drug use: No  ? Sexual activity: Yes  ?Other Topics Concern  ? Not on file  ?Social History Narrative  ? Not on file  ? ?Social Determinants of Health  ? ?Financial Resource Strain: Not on file  ?Food Insecurity: Not on file  ?Transportation Needs: Not on file  ?Physical Activity: Not on file  ?Stress: Not on file  ?Social Connections: Not on file  ?Intimate Partner Violence: Not on file  ? ? ?Family History  ?Problem Relation Age of Onset  ? Heart disease Mother   ? Stroke Mother   ? Hypertension Mother   ? Stroke Father   ? Colon cancer Sister   ? SIDS Son   ? Stroke Paternal Grandmother   ? Cancer Paternal Grandmother   ? ALS Brother   ? Stroke Maternal Grandmother   ? Cancer Paternal Grandfather   ? Testicular cancer Son   ? Down syndrome Son   ?  Heart disease Brother   ? ? ? ?Review of Systems  ?Constitutional: Negative.  Negative for chills and fever.  ?HENT: Negative.  Negative for congestion and sore throat.   ?Respiratory: Negative.  Negative for cough and shortness of breath.   ?Cardiovascular: Negative.  Negative for chest pain and palpitations.  ?Gastrointestinal:  Negative for abdominal pain, nausea and vomiting.  ?Genitourinary: Negative.  Negative for dysuria and hematuria.  ?Musculoskeletal:  Positive for joint pain.  ?Skin: Negative.  Negative for rash.  ?Neurological: Negative.  Negative for dizziness and headaches.  ?All other systems reviewed and are negative. ? ?Today's Vitals  ? 11/04/21 0908  ?BP: 118/78  ?Pulse: 64  ?SpO2: 95%  ?Weight: 181 lb (82.1 kg)  ?Height: 5\' 3"  (1.6 m)  ? ?Body mass index is 32.06 kg/m?. ? ?Physical Exam ?Vitals reviewed.  ?Constitutional:   ?   Appearance: Normal appearance.  ?HENT:  ?   Head: Normocephalic.  ?Eyes:  ?   Extraocular Movements: Extraocular movements intact.  ?   Conjunctiva/sclera: Conjunctivae normal.  ?    Pupils: Pupils are equal, round, and reactive to light.  ?Cardiovascular:  ?   Rate and Rhythm: Normal rate and regular rhythm.  ?   Pulses: Normal pulses.  ?   Heart sounds: Normal heart sounds.  ?Pulmonary:

## 2021-11-04 NOTE — Assessment & Plan Note (Addendum)
Well-controlled hypertension. ?Continue losartan 100 mg daily and metoprolol tartrate 25 mg twice a day. ?Well-controlled diabetes.  Continue metformin 500 mg twice a day. ?Diet and nutrition discussed. ?Continue rosuvastatin 10 mg daily. ?

## 2021-11-04 NOTE — Assessment & Plan Note (Signed)
Clinically euthyroid.  Continue Synthroid 75 mcg daily. 

## 2021-11-04 NOTE — Patient Instructions (Signed)
Health Maintenance After Age 75 After age 75, you are at a higher risk for certain long-term diseases and infections as well as injuries from falls. Falls are a major cause of broken bones and head injuries in people who are older than age 75. Getting regular preventive care can help to keep you healthy and well. Preventive care includes getting regular testing and making lifestyle changes as recommended by your health care provider. Talk with your health care provider about: Which screenings and tests you should have. A screening is a test that checks for a disease when you have no symptoms. A diet and exercise plan that is right for you. What should I know about screenings and tests to prevent falls? Screening and testing are the best ways to find a health problem early. Early diagnosis and treatment give you the best chance of managing medical conditions that are common after age 75. Certain conditions and lifestyle choices may make you more likely to have a fall. Your health care provider may recommend: Regular vision checks. Poor vision and conditions such as cataracts can make you more likely to have a fall. If you wear glasses, make sure to get your prescription updated if your vision changes. Medicine review. Work with your health care provider to regularly review all of the medicines you are taking, including over-the-counter medicines. Ask your health care provider about any side effects that may make you more likely to have a fall. Tell your health care provider if any medicines that you take make you feel dizzy or sleepy. Strength and balance checks. Your health care provider may recommend certain tests to check your strength and balance while standing, walking, or changing positions. Foot health exam. Foot pain and numbness, as well as not wearing proper footwear, can make you more likely to have a fall. Screenings, including: Osteoporosis screening. Osteoporosis is a condition that causes  the bones to get weaker and break more easily. Blood pressure screening. Blood pressure changes and medicines to control blood pressure can make you feel dizzy. Depression screening. You may be more likely to have a fall if you have a fear of falling, feel depressed, or feel unable to do activities that you used to do. Alcohol use screening. Using too much alcohol can affect your balance and may make you more likely to have a fall. Follow these instructions at home: Lifestyle Do not drink alcohol if: Your health care provider tells you not to drink. If you drink alcohol: Limit how much you have to: 0-1 drink a day for women. 0-2 drinks a day for men. Know how much alcohol is in your drink. In the [place] one drink equals one 12 oz bottle of beer (355 mL), one 5 oz glass of wine (148 mL), or one 1 oz glass of hard liquor (44 mL). Do not use any products that contain nicotine or tobacco. These products include cigarettes, chewing tobacco, and vaping devices, such as e-cigarettes. If you need help quitting, ask your health care provider. Activity  Follow a regular exercise program to stay fit. This will help you maintain your balance. Ask your health care provider what types of exercise are appropriate for you. If you need a cane or walker, use it as recommended by your health care provider. Wear supportive shoes that have nonskid soles. Safety  Remove any tripping hazards, such as rugs, cords, and clutter. Install safety equipment such as grab bars in bathrooms and safety rails on stairs. Keep rooms and walkways   well-lit. General instructions Talk with your health care provider about your risks for falling. Tell your health care provider if: You fall. Be sure to tell your health care provider about all falls, even ones that seem minor. You feel dizzy, tiredness (fatigue), or off-balance. Take over-the-counter and prescription medicines only as told by your health care provider. These include  supplements. Eat a healthy diet and maintain a healthy weight. A healthy diet includes low-fat dairy products, low-fat (lean) meats, and fiber from whole grains, beans, and lots of fruits and vegetables. Stay current with your vaccines. Schedule regular health, dental, and eye exams. Summary Having a healthy lifestyle and getting preventive care can help to protect your health and wellness after age 75. Screening and testing are the best way to find a health problem early and help you avoid having a fall. Early diagnosis and treatment give you the best chance for managing medical conditions that are more common for people who are older than age 75. Falls are a major cause of broken bones and head injuries in people who are older than age 75. Take precautions to prevent a fall at home. Work with your health care provider to learn what changes you can make to improve your health and wellness and to prevent falls. This information is not intended to replace advice given to you by your health care provider. Make sure you discuss any questions you have with your health care provider. Document Revised: 12/29/2020 Document Reviewed: 12/29/2020 Elsevier Patient Education  2022 [employer]  

## 2022-01-20 NOTE — Progress Notes (Signed)
Office Visit Note  Patient: Isabella Dixon             Date of Birth: 1946-03-07           MRN: [id number]             PCP: Georgina Quint, MD Referring: Georgina Quint, * Visit Date: 02/02/2022 Occupation: @GUAROCC @  Subjective:  Medication management  History of Present Illness: Isabella Dixon is a 75 y.o. female with history of rheumatoid arthritis, osteoarthritis and gouty arthropathy.  She states she has been taking hydroxychloroquine on a regular basis since September 28, 2021.  She has not had any flares of rheumatoid arthritis.  She denies any flare of gouty arthropathy.  She is taking probenecid 500 mg p.o. 3 times daily.  She did not have to use any colchicine.  She continues to have pain and discomfort due to underlying osteoarthritis.  She states when she uses her hands she has increased discomfort in her hands.  She denies any discomfort in her knee joints.  She has intermittent discomfort in her feet.  She states that she has constant pain in her neck which radiates into her right arm intermittently.  She also has lower back pain off-and-on with activities.  Activities of Daily Living:  Patient reports morning stiffness for 30 minutes.   Patient Denies nocturnal pain.  Difficulty dressing/grooming: Denies Difficulty climbing stairs: Denies Difficulty getting out of chair: Denies Difficulty using hands for taps, buttons, cutlery, and/or writing: Reports  Review of Systems  Constitutional:  Positive for fatigue.  HENT:  Negative for mouth dryness.   Eyes:  Negative for dryness.  Respiratory:  Negative for shortness of breath.   Cardiovascular:  Negative for swelling in legs/feet.  Gastrointestinal:  Positive for diarrhea.  Endocrine: Positive for heat intolerance.  Genitourinary:  Negative for difficulty urinating.  Musculoskeletal:  Positive for joint pain, gait problem, joint pain and morning stiffness.  Skin:  Positive for sensitivity to  sunlight. Negative for color change and rash.  Allergic/Immunologic: Negative for susceptible to infections.  Neurological:  Negative for numbness.  Hematological:  Negative for bruising/bleeding tendency.  Psychiatric/Behavioral:  Negative for sleep disturbance.     PMFS History:  Patient Active Problem List   Diagnosis Date Noted   Rheumatoid arthritis involving multiple sites with positive rheumatoid factor (HCC) 11/04/2021   Polyarthralgia 07/17/2021   Osteoporosis 05/05/2021   Primary osteoarthritis of both hands 06/21/2019   Primary osteoarthritis of both feet 06/21/2019   Primary osteoarthritis of both knees 06/21/2019   History of sciatica 09/06/2017   Obesity, diabetes, and hypertension syndrome (HCC) 09/06/2017   History of gout 09/06/2017   Diabetes mellitus type 2 in obese (HCC) 05/13/2015   Hypothyroid 07/25/2012   Gouty arthritis 05/02/2012   Osteoarthritis 05/02/2012   Hypertension associated with diabetes (HCC) 05/02/2012   Hyperlipidemia 05/02/2012   Abnormal Papanicolaou smear of vagina 05/02/2012    Past Medical History:  Diagnosis Date   Anemia    Arthritis    Diabetes mellitus without complication (HCC)    Gout    High cholesterol    Hypertension    Osteoporosis     Family History  Problem Relation Age of Onset   Heart disease Mother    Stroke Mother    Hypertension Mother    Stroke Father    Colon cancer Sister    SIDS Son    Stroke Paternal Grandmother    Cancer Paternal Grandmother  ALS Brother    Stroke Maternal Grandmother    Cancer Paternal Grandfather    Testicular cancer Son    Down syndrome Son    Heart disease Brother    Past Surgical History:  Procedure Laterality Date   ABDOMINAL HYSTERECTOMY     TUBAL LIGATION     Social History   Social History Narrative   Not on file   Immunization History  Administered Date(s) Administered   Fluad Quad(high Dose 65+) 05/02/2019, 04/30/2020, 05/05/2021   Influenza Split  05/02/2012   Influenza, High Dose Seasonal PF 06/15/2018   Influenza,inj,Quad PF,6+ Mos 05/08/2013, 04/30/2014, 05/13/2015, 05/17/2016, 09/06/2017   PFIZER(Purple Top)SARS-COV-2 Vaccination 09/17/2019, 10/08/2019, 06/05/2020, 06/04/2021   Pneumococcal Conjugate-13 05/13/2015   Pneumococcal Polysaccharide-23 11/01/2018   Tdap 03/14/2008     Objective: Vital Signs: BP (!) 147/83 (BP Location: Left Arm, Patient Position: Sitting, Cuff Size: Small)   Pulse 66   Resp 13   Ht 5\' 3"  (1.6 m)   Wt 184 lb 12.8 oz (83.8 kg)   BMI 32.74 kg/m    Physical Exam Vitals and nursing note reviewed.  Constitutional:      Appearance: She is well-developed.  HENT:     Head: Normocephalic and atraumatic.  Eyes:     Conjunctiva/sclera: Conjunctivae normal.  Cardiovascular:     Rate and Rhythm: Normal rate and regular rhythm.     Heart sounds: Normal heart sounds.  Pulmonary:     Effort: Pulmonary effort is normal.     Breath sounds: Normal breath sounds.  Abdominal:     General: Bowel sounds are normal.     Palpations: Abdomen is soft.  Musculoskeletal:     Cervical back: Normal range of motion.  Lymphadenopathy:     Cervical: No cervical adenopathy.  Skin:    General: Skin is warm and dry.     Capillary Refill: Capillary refill takes less than 2 seconds.  Neurological:     Mental Status: She is alert and oriented to person, place, and time.  Psychiatric:        Behavior: Behavior normal.      Musculoskeletal Exam: She had limited lateral rotation especially to the right side and discomfort range of motion of her cervical spine.  She had painful range of motion of the lumbar spine.  Shoulder joints, elbow joints, wrist joints, MCPs but good range of motion with no synovitis.  She had bilateral PIP and DIP thickening consistent with osteoarthritis.  Hip joints and knee joints with good range of motion.  There was no tenderness over ankles or MTPs.  CDAI Exam: CDAI Score: 0.4  Patient  Global: 3 mm; Provider Global: 1 mm Swollen: 0 ; Tender: 0  Joint Exam 02/02/2022   No joint exam has been documented for this visit   There is currently no information documented on the homunculus. Go to the Rheumatology activity and complete the homunculus joint exam.  Investigation: No additional findings.  Imaging: No results found.  Recent Labs: Lab Results  Component Value Date   WBC 6.0 11/02/2021   HGB 13.0 11/02/2021   PLT 212 11/02/2021   NA 140 11/02/2021   K 4.6 11/02/2021   CL 102 11/02/2021   CO2 25 11/02/2021   GLUCOSE 89 11/02/2021   BUN 17 11/02/2021   CREATININE 0.83 11/02/2021   BILITOT 0.4 11/02/2021   ALKPHOS 74 08/25/2021   AST 19 11/02/2021   ALT 14 11/02/2021   PROT 7.9 11/02/2021   ALBUMIN 3.8 08/25/2021  CALCIUM 9.3 11/02/2021   GFRAA 86 07/02/2020    Speciality Comments: PLQ Eye Exam 01/25/2022 WNL @ [facility] Follow up in 1 year PLQ 09/28/2021 Allopurinol- diarrhea  Procedures:  No procedures performed Allergies: Patient has no known allergies.   Assessment / Plan:     Visit Diagnoses: Rheumatoid arthritis involving multiple sites with positive rheumatoid factor (HCC) - RF+, responsive to prednisone use: She had no synovitis on examination.  She has been on Plaquenil 200 mg p.o. twice daily Monday to Friday since September 28, 2021 which she has been tolerating well.  She has not noticed any flares of rheumatoid arthritis since she has been on hydroxychloroquine.  She states increased activity causes increased pain in her hands but she has not noticed any swelling.  High risk medication use - Plaquenil 200 mg 1 tablet by mouth twice daily Monday to Friday. -Labs obtained on November 02, 2021 were reviewed which were within normal limits.  We will check labs today and then every 5 months to monitor for drug toxicity.  Plan: CBC with Differential/Platelet, COMPLETE METABOLIC PANEL WITH GFR.  Eye examination was on January 25, 2022 which  was within normal limits.  She will get annual eye examination.  Information regarding immunization was placed in the AVS.  Primary osteoarthritis of both hands-she has severe osteoarthritis in her hands with incomplete fist formation.  She was encouraged to do hand exercises.  Primary osteoarthritis of both knees-she is off-and-on discomfort in her knee joints.  Lower extremity muscle strengthening exercises were discussed.  Primary osteoarthritis of both feet-proper fitting shoes were discussed.  Neck pain -she has been having increased pain in her neck.  She had limited lateral rotation and some discomfort with right lateral rotation.  Plan: XR Cervical Spine 2 or 3 views.  X-rays were consistent with multilevel spondylosis and facet joint arthropathy.  Most severe narrowing was noted between C5-C6 and C6-C7 with anterior osteophytes.  X-ray findings were discussed with the patient.  A handout on neck exercises was given.  DDD (degenerative disc disease), lumbar-she has chronic lower back pain which is manageable currently.  Gouty arthritis - probenecid 500 mg 3 times daily and Mitigare 0.6 mg BID as needed during gout flares.  She could not tolerate allopurinol in the past.  She has not had any gout flare.  She requested a prescription for probenecid as her mail-in prescription is delayed.  I gave her 30-day supply of probenecid.  We will check uric acid today.- Plan: Uric acid  Osteopenia of multiple sites - DEXA done by PCP 09/28/19 T -2.3.  Use of calcium rich diet with vitamin D and exercise was emphasized.  Other medical problems are listed as follows:  History of hyperlipidemia  Essential hypertension-blood pressure was elevated today.  She was advised to monitor blood pressure closely.  Type 2 diabetes mellitus without complication, without long-term current use of insulin (HCC)  History of hypothyroidism  Orders: Orders Placed This Encounter  Procedures   XR Cervical Spine 2  or 3 views   CBC with Differential/Platelet   COMPLETE METABOLIC PANEL WITH GFR   Uric acid   Meds ordered this encounter  Medications   probenecid (BENEMID) 500 MG tablet    Sig: Take 1 tablet (500 mg total) by mouth 3 (three) times daily.    Dispense:  30 tablet    Refill:  0     Follow-Up Instructions: Return in about 5 months (around 07/05/2022) for Rheumatoid arthritis,  Osteoarthritis.   Bo Merino, MD  Note - This record has been created using Editor, commissioning.  Chart creation errors have been sought, but may not always  have been located. Such creation errors do not reflect on  the standard of medical care.

## 2022-01-25 LAB — HM DIABETES EYE EXAM

## 2022-02-02 ENCOUNTER — Encounter: Payer: Self-pay | Admitting: Rheumatology

## 2022-02-02 ENCOUNTER — Ambulatory Visit (INDEPENDENT_AMBULATORY_CARE_PROVIDER_SITE_OTHER): Payer: Medicare Other | Admitting: Rheumatology

## 2022-02-02 ENCOUNTER — Ambulatory Visit (INDEPENDENT_AMBULATORY_CARE_PROVIDER_SITE_OTHER): Payer: Medicare Other

## 2022-02-02 VITALS — BP 147/83 | HR 66 | Resp 13 | Ht 63.0 in | Wt 184.8 lb

## 2022-02-02 DIAGNOSIS — M170 Bilateral primary osteoarthritis of knee: Secondary | ICD-10-CM

## 2022-02-02 DIAGNOSIS — Z79899 Other long term (current) drug therapy: Secondary | ICD-10-CM | POA: Diagnosis not present

## 2022-02-02 DIAGNOSIS — M542 Cervicalgia: Secondary | ICD-10-CM

## 2022-02-02 DIAGNOSIS — I10 Essential (primary) hypertension: Secondary | ICD-10-CM

## 2022-02-02 DIAGNOSIS — E119 Type 2 diabetes mellitus without complications: Secondary | ICD-10-CM

## 2022-02-02 DIAGNOSIS — M19072 Primary osteoarthritis, left ankle and foot: Secondary | ICD-10-CM

## 2022-02-02 DIAGNOSIS — M109 Gout, unspecified: Secondary | ICD-10-CM

## 2022-02-02 DIAGNOSIS — M0579 Rheumatoid arthritis with rheumatoid factor of multiple sites without organ or systems involvement: Secondary | ICD-10-CM

## 2022-02-02 DIAGNOSIS — M8589 Other specified disorders of bone density and structure, multiple sites: Secondary | ICD-10-CM

## 2022-02-02 DIAGNOSIS — M19071 Primary osteoarthritis, right ankle and foot: Secondary | ICD-10-CM

## 2022-02-02 DIAGNOSIS — M19041 Primary osteoarthritis, right hand: Secondary | ICD-10-CM | POA: Diagnosis not present

## 2022-02-02 DIAGNOSIS — M19042 Primary osteoarthritis, left hand: Secondary | ICD-10-CM

## 2022-02-02 DIAGNOSIS — M5136 Other intervertebral disc degeneration, lumbar region: Secondary | ICD-10-CM

## 2022-02-02 DIAGNOSIS — Z8639 Personal history of other endocrine, nutritional and metabolic disease: Secondary | ICD-10-CM

## 2022-02-02 MED ORDER — PROBENECID 500 MG PO TABS: 500.0000 mg | ORAL_TABLET | Freq: Three times a day (TID) | ORAL | 0 refills | Status: DC

## 2022-02-02 NOTE — Patient Instructions (Signed)
Standing Labs We placed an order today for your standing lab work.   Please have your standing labs drawn in November  If possible, please have your labs drawn 2 weeks prior to your appointment so that the provider can discuss your results at your appointment.  Please note that you may see your imaging and lab results in MyChart before we have reviewed them. We may be awaiting multiple results to interpret others before contacting you. Please allow our office up to 72 hours to thoroughly review all of the results before contacting the office for clarification of your results.  We have open lab daily: Monday through Thursday from 1:30-4:30 PM and Friday from 1:30-4:00 PM at the office of Dr. Pollyann Savoy, [facility].   Please be advised, all patients with office appointments requiring lab work will take precedent over walk-in lab work.  If possible, please come for your lab work on Monday and Friday afternoons, as you may experience shorter wait times. The office is located at [address], [place], [place] [address] No appointment is necessary.   Labs are drawn by [facility]. Please bring your co-pay at the time of your lab draw.  You may receive a bill from [facility] for your lab work.  Please note if you are on Hydroxychloroquine and and an order has been placed for a Hydroxychloroquine level, you will need to have it drawn 4 hours or more after your last dose.  If you wish to have your labs drawn at another location, please call the office 24 hours in advance to send orders.  If you have any questions regarding directions or hours of operation,  please call [phone].   As a reminder, please drink plenty of water prior to coming for your lab work. Thanks!   Vaccines You are taking a medication(s) that can suppress your immune system.  The following immunizations are recommended: Flu annually Covid-19  Td/Tdap (tetanus, diphtheria, pertussis) every 10  years Pneumonia (Prevnar 15 then Pneumovax 23 at least 1 year apart.  Alternatively, can take Prevnar 20 without needing additional dose) Shingrix: 2 doses from 4 weeks to 6 months apart  Please check with your PCP to make sure you are up to date.  Cervical Strain and Sprain Rehab Ask your health care provider which exercises are safe for you. Do exercises exactly as told by your health care provider and adjust them as directed. It is normal to feel mild stretching, pulling, tightness, or discomfort as you do these exercises. Stop right away if you feel sudden pain or your pain gets worse. Do not begin these exercises until told by your health care provider. Stretching and range-of-motion exercises Cervical side bending  Using good posture, sit on a stable chair or stand up. Without moving your shoulders, slowly tilt your left / right ear to your shoulder until you feel a stretch in the opposite side neck muscles. You should be looking straight ahead. Hold for __________ seconds. Repeat with the other side of your neck. Repeat __________ times. Complete this exercise __________ times a day. Cervical rotation  Using good posture, sit on a stable chair or stand up. Slowly turn your head to the side as if you are looking over your left / right shoulder. Keep your eyes level with the ground. Stop when you feel a stretch along the side and the back of your neck. Hold for __________ seconds. Repeat this by turning to your other side. Repeat __________ times. Complete  this exercise __________ times a day. Thoracic extension and pectoral stretch  Roll a towel or a small blanket so it is about 4 inches (10 cm) in diameter. Lie down on your back on a firm surface. Put the towel in the middle of your back across your spine. It should not be under your shoulder blades. Put your hands behind your head and let your elbows fall out to your sides. Hold for __________ seconds. Repeat __________ times.  Complete this exercise __________ times a day. Strengthening exercises Isometric upper cervical flexion  Lie on your back with a thin pillow behind your head and a small rolled-up towel under your neck. Gently tuck your chin toward your chest and nod your head down to look toward your feet. Do not lift your head off the pillow. Hold for __________ seconds. Release the tension slowly. Relax your neck muscles completely before you repeat this exercise. Repeat __________ times. Complete this exercise __________ times a day. Isometric cervical extension  Stand about 6 inches (15 cm) away from a wall, with your back facing the wall. Place a soft object, about 6-8 inches (15-20 cm) in diameter, between the back of your head and the wall. A soft object could be a small pillow, a ball, or a folded towel. Gently tilt your head back and press into the soft object. Keep your jaw and forehead relaxed. Hold for __________ seconds. Release the tension slowly. Relax your neck muscles completely before you repeat this exercise. Repeat __________ times. Complete this exercise __________ times a day. Posture and body mechanics Body mechanics refers to the movements and positions of your body while you do your daily activities. Posture is part of body mechanics. Good posture and healthy body mechanics can help to relieve stress in your body's tissues and joints. Good posture means that your spine is in its natural S-curve position (your spine is neutral), your shoulders are pulled back slightly, and your head is not tipped forward. The following are general guidelines for applying improved posture and body mechanics to your everyday activities. Sitting  When sitting, keep your spine neutral and keep your feet flat on the floor. Use a footrest, if necessary, and keep your thighs parallel to the floor. Avoid rounding your shoulders, and avoid tilting your head forward. When working at a desk or a computer, keep  your desk at a height where your hands are slightly lower than your elbows. Slide your chair under your desk so you are close enough to maintain good posture. When working at a computer, place your monitor at a height where you are looking straight ahead and you do not have to tilt your head forward or downward to look at the screen. Standing  When standing, keep your spine neutral and keep your feet about hip-width apart. Keep a slight bend in your knees. Your ears, shoulders, and hips should line up. When you do a task in which you stand in one place for a long time, place one foot up on a stable object that is 2-4 inches (5-10 cm) high, such as a footstool. This helps keep your spine neutral. Resting When lying down and resting, avoid positions that are most painful for you. Try to support your neck in a neutral position. You can use a contour pillow or a small rolled-up towel. Your pillow should support your neck but not push on it. This information is not intended to replace advice given to you by your health care provider. Make  sure you discuss any questions you have with your health care provider. Document Revised: 06/29/2021 Document Reviewed: 06/29/2021 Elsevier Patient Education  2023 [employer]

## 2022-02-03 LAB — CBC WITH DIFFERENTIAL/PLATELET
Absolute Monocytes: 679 cells/uL (ref 200–950)
Basophils Absolute: 42 cells/uL (ref 0–200)
Basophils Relative: 0.6 %
Eosinophils Absolute: 308 cells/uL (ref 15–500)
Eosinophils Relative: 4.4 %
HCT: 38.8 % (ref 35.0–45.0)
Hemoglobin: 12.7 g/dL (ref 11.7–15.5)
Lymphs Abs: 2940 cells/uL (ref 850–3900)
MCH: 30.2 pg (ref 27.0–33.0)
MCHC: 32.7 g/dL (ref 32.0–36.0)
MCV: 92.2 fL (ref 80.0–100.0)
MPV: 11.1 fL (ref 7.5–12.5)
Monocytes Relative: 9.7 %
Neutro Abs: 3031 cells/uL (ref 1500–7800)
Neutrophils Relative %: 43.3 %
Platelets: 194 10*3/uL (ref 140–400)
RBC: 4.21 10*6/uL (ref 3.80–5.10)
RDW: 12.8 % (ref 11.0–15.0)
Total Lymphocyte: 42 %
WBC: 7 10*3/uL (ref 3.8–10.8)

## 2022-02-03 LAB — URIC ACID: Uric Acid, Serum: 4.8 mg/dL (ref 2.5–7.0)

## 2022-02-03 LAB — COMPLETE METABOLIC PANEL WITH GFR
AG Ratio: 1.1 (calc) (ref 1.0–2.5)
ALT: 19 U/L (ref 6–29)
AST: 21 U/L (ref 10–35)
Albumin: 4.2 g/dL (ref 3.6–5.1)
Alkaline phosphatase (APISO): 80 U/L (ref 37–153)
BUN: 21 mg/dL (ref 7–25)
CO2: 26 mmol/L (ref 20–32)
Calcium: 9.8 mg/dL (ref 8.6–10.4)
Chloride: 101 mmol/L (ref 98–110)
Creat: 0.8 mg/dL (ref 0.60–1.00)
Globulin: 4 g/dL (calc) — ABNORMAL HIGH (ref 1.9–3.7)
Glucose, Bld: 83 mg/dL (ref 65–99)
Potassium: 4.4 mmol/L (ref 3.5–5.3)
Sodium: 139 mmol/L (ref 135–146)
Total Bilirubin: 0.3 mg/dL (ref 0.2–1.2)
Total Protein: 8.2 g/dL — ABNORMAL HIGH (ref 6.1–8.1)
eGFR: 77 mL/min/{1.73_m2} (ref >=60)

## 2022-02-03 NOTE — Progress Notes (Signed)
CBC and CMP are normal.  Uric acid is in the desirable range.

## 2022-03-08 ENCOUNTER — Other Ambulatory Visit: Payer: Self-pay | Admitting: Physician Assistant

## 2022-03-08 ENCOUNTER — Other Ambulatory Visit: Payer: Self-pay | Admitting: Rheumatology

## 2022-03-08 DIAGNOSIS — M79671 Pain in right foot: Secondary | ICD-10-CM

## 2022-03-08 DIAGNOSIS — M79641 Pain in right hand: Secondary | ICD-10-CM

## 2022-03-08 DIAGNOSIS — M0579 Rheumatoid arthritis with rheumatoid factor of multiple sites without organ or systems involvement: Secondary | ICD-10-CM

## 2022-03-08 MED ORDER — HYDROXYCHLOROQUINE SULFATE 200 MG PO TABS: ORAL_TABLET | ORAL | 0 refills | Status: DC

## 2022-03-08 NOTE — Telephone Encounter (Signed)
Next Visit: 07/14/2022  Last Visit: 02/02/2022  Labs: 02/02/2022 CBC and CMP are normal.   Eye exam: 01/25/2022 WNL    Current Dose per office note 02/02/2022: Plaquenil 200 mg 1 tablet by mouth twice daily Monday to Friday  DX: Rheumatoid arthritis involving multiple sites with positive rheumatoid factor   Last Fill: 10/26/2021  Okay to refill Plaquenil?

## 2022-03-08 NOTE — Telephone Encounter (Signed)
Patient's husband Raiford Noble called requesting prescription refill for Hydroxychloroquine to be sent to [facility].  Patient will be out of medication on Friday, 03/12/22 and requesting automatic refills.

## 2022-05-10 ENCOUNTER — Telehealth: Payer: Self-pay | Admitting: Emergency Medicine

## 2022-05-10 ENCOUNTER — Ambulatory Visit (INDEPENDENT_AMBULATORY_CARE_PROVIDER_SITE_OTHER): Payer: Medicare Other | Admitting: Emergency Medicine

## 2022-05-10 ENCOUNTER — Encounter: Payer: Self-pay | Admitting: Emergency Medicine

## 2022-05-10 VITALS — BP 132/86 | HR 61 | Temp 97.9°F | Ht 63.0 in | Wt 189.1 lb

## 2022-05-10 DIAGNOSIS — I10 Essential (primary) hypertension: Secondary | ICD-10-CM

## 2022-05-10 DIAGNOSIS — E038 Other specified hypothyroidism: Secondary | ICD-10-CM | POA: Diagnosis not present

## 2022-05-10 DIAGNOSIS — Z23 Encounter for immunization: Secondary | ICD-10-CM

## 2022-05-10 DIAGNOSIS — E119 Type 2 diabetes mellitus without complications: Secondary | ICD-10-CM

## 2022-05-10 DIAGNOSIS — E1169 Type 2 diabetes mellitus with other specified complication: Secondary | ICD-10-CM

## 2022-05-10 DIAGNOSIS — E785 Hyperlipidemia, unspecified: Secondary | ICD-10-CM

## 2022-05-10 DIAGNOSIS — I152 Hypertension secondary to endocrine disorders: Secondary | ICD-10-CM

## 2022-05-10 DIAGNOSIS — E1159 Type 2 diabetes mellitus with other circulatory complications: Secondary | ICD-10-CM | POA: Diagnosis not present

## 2022-05-10 DIAGNOSIS — M0579 Rheumatoid arthritis with rheumatoid factor of multiple sites without organ or systems involvement: Secondary | ICD-10-CM

## 2022-05-10 DIAGNOSIS — Z8739 Personal history of other diseases of the musculoskeletal system and connective tissue: Secondary | ICD-10-CM

## 2022-05-10 DIAGNOSIS — E669 Obesity, unspecified: Secondary | ICD-10-CM

## 2022-05-10 LAB — POCT GLYCOSYLATED HEMOGLOBIN (HGB A1C): Hemoglobin A1C: 5.5 % (ref 4.0–5.6)

## 2022-05-10 MED ORDER — METFORMIN HCL 500 MG PO TABS: 500.0000 mg | ORAL_TABLET | Freq: Two times a day (BID) | ORAL | 3 refills | Status: DC

## 2022-05-10 MED ORDER — LEVOTHYROXINE SODIUM 75 MCG PO TABS: 75.0000 ug | ORAL_TABLET | Freq: Every day | ORAL | 3 refills | Status: DC

## 2022-05-10 MED ORDER — METOPROLOL TARTRATE 25 MG PO TABS: 25.0000 mg | ORAL_TABLET | Freq: Two times a day (BID) | ORAL | 3 refills | Status: DC

## 2022-05-10 NOTE — Assessment & Plan Note (Signed)
Clinically euthyroid.  Continue Synthroid 75 mcg daily. 

## 2022-05-10 NOTE — Assessment & Plan Note (Signed)
Well-controlled hypertension. BP Readings from Last 3 Encounters:  05/10/22 132/86  02/02/22 (!) 147/83  11/04/21 118/78  Continue metoprolol tartrate 25 mg twice a day and losartan 100 mg daily. Well-controlled diabetes with hemoglobin A1c of 5.5. Continue metformin 500 mg twice a day. Cardiovascular risks associated with hypertension and diabetes discussed. Diet and nutrition discussed. Follow-up in 6 months.

## 2022-05-10 NOTE — Assessment & Plan Note (Signed)
Stable.  Diet and nutrition discussed. Continue rosuvastatin 10 mg daily. The 10-year ASCVD risk score (Arnett DK, et al., 2019) is: 37.4%   Values used to calculate the score:     Age: 76 years     Sex: Female     Is Non-Hispanic African American: No     Diabetic: Yes     Tobacco smoker: No     Systolic Blood Pressure: 741 mmHg     Is BP treated: Yes     HDL Cholesterol: 39.7 mg/dL     Total Cholesterol: 142 mg/dL

## 2022-05-10 NOTE — Assessment & Plan Note (Signed)
Clinically stable.  Sees Dr. Estanislado Pandy, rheumatologist, on a regular basis. Continue Plaquenil as prescribed by rheumatologist.

## 2022-05-10 NOTE — Assessment & Plan Note (Signed)
Stable.  Only takes colchicine for flareups.  Intolerant to allopurinol. Continue probenecid 500 mg 3 times a day.

## 2022-05-10 NOTE — Progress Notes (Signed)
Isabella Dixon 76 y.o.   Chief Complaint  Patient presents with   Follow-up    17mth f/u appt , no concerns     HISTORY OF PRESENT ILLNESS: This is a 76 y.o. female here for 58-month follow-up of chronic medical problems. Has no complaints or medical concerns today. Overall doing well. History of hypertension, rheumatoid arthritis and gout, and hypothyroidism.  Also history of diabetes.  HPI   Prior to Admission medications   Medication Sig Start Date End Date Taking? Authorizing Provider  chlorpheniramine (CHLOR-TRIMETON) 4 MG tablet Take 4 mg by mouth 2 (two) times daily as needed for allergies.   Yes [provider]  Cholecalciferol (VITAMIN D-3 PO) Take 2,000 Units by mouth daily.   Yes [provider]  colchicine 0.6 MG tablet Take 2 tablets (1.2 mg total) by mouth 2 (two) times daily. 07/20/21  Yes Ghimire, Henreitta Leber, MD  hydroxychloroquine (PLAQUENIL) 200 MG tablet TAKE 1 TABLET TWICE A DAY  MONDAY THROUGH FRIDAY ONLY.DO NOT TAKE ON SATURDAY ANDSUNDAY 03/08/22  Yes Ofilia Neas, PA-C  losartan (COZAAR) 100 MG tablet Take 1 tablet (100 mg total) by mouth daily. 05/05/21  Yes [place] Paster, Ines Bloomer, MD  Multiple Vitamin (MULTIVITAMIN) tablet Take 1 tablet by mouth daily.   Yes [provider]  rosuvastatin (CRESTOR) 10 MG tablet Take 1 tablet (10 mg total) by mouth daily. 05/05/21  Yes Selestino Nila, Ines Bloomer, MD  vitamin C (ASCORBIC ACID) 250 MG tablet Take 250 mg by mouth daily.   Yes [provider]  levothyroxine (SYNTHROID) 75 MCG tablet Take 1 tablet (75 mcg total) by mouth daily. 05/05/21 02/02/22  Horald Pollen, MD  metFORMIN (GLUCOPHAGE) 500 MG tablet Take 1 tablet (500 mg total) by mouth 2 (two) times daily with a meal. 05/05/21 02/02/22  Jarren Para, Ines Bloomer, MD  metoprolol tartrate (LOPRESSOR) 25 MG tablet Take 1 tablet (25 mg total) by mouth 2 (two) times daily. 05/05/21 02/02/22  Horald Pollen, MD  probenecid (BENEMID)  500 MG tablet Take 1 tablet (500 mg total) by mouth 3 (three) times daily. 02/02/22 05/03/22  Bo Merino, MD    No Known Allergies  Patient Active Problem List   Diagnosis Date Noted   Rheumatoid arthritis involving multiple sites with positive rheumatoid factor ([place]) 11/04/2021   Polyarthralgia 07/17/2021   Osteoporosis 05/05/2021   Primary osteoarthritis of both hands 06/21/2019   Primary osteoarthritis of both feet 06/21/2019   Primary osteoarthritis of both knees 06/21/2019   History of sciatica 09/06/2017   Obesity, diabetes, and hypertension syndrome ([place]) 09/06/2017   History of gout 09/06/2017   Diabetes mellitus type 2 in obese ([place]) 05/13/2015   Hypothyroid 07/25/2012   Gouty arthritis 05/02/2012   Osteoarthritis 05/02/2012   Hypertension associated with diabetes ([place]) 05/02/2012   Hyperlipidemia 05/02/2012   Abnormal Papanicolaou smear of vagina 05/02/2012    Past Medical History:  Diagnosis Date   Anemia    Arthritis    Diabetes mellitus without complication (HCC)    Gout    High cholesterol    Hypertension    Osteoporosis     Past Surgical History:  Procedure Laterality Date   ABDOMINAL HYSTERECTOMY     TUBAL LIGATION      Social History   Socioeconomic History   Marital status: Married    Spouse name: Not on file   Number of children: Not on file   Years of education: Not on file   Highest education level: Not  on file  Occupational History   Not on file  Tobacco Use   Smoking status: Never    Passive exposure: Past   Smokeless tobacco: Never  Vaping Use   Vaping Use: Never used  Substance and Sexual Activity   Alcohol use: No   Drug use: No   Sexual activity: Yes  Other Topics Concern   Not on file  Social History Narrative   Not on file   Social Determinants of Health   Financial Resource Strain: Not on file  Food Insecurity: Not on file  Transportation Needs: Not on file  Physical Activity: Not on file  Stress: Not on file   Social Connections: Not on file  Intimate Partner Violence: Not on file    Family History  Problem Relation Age of Onset   Heart disease Mother    Stroke Mother    Hypertension Mother    Stroke Father    Colon cancer Sister    SIDS Son    Stroke Paternal Grandmother    Cancer Paternal Grandmother    ALS Brother    Stroke Maternal Grandmother    Cancer Paternal Grandfather    Testicular cancer Son    Down syndrome Son    Heart disease Brother      Review of Systems  Constitutional: Negative.  Negative for chills and fever.  HENT: Negative.  Negative for congestion and sore throat.   Respiratory: Negative.  Negative for cough and shortness of breath.   Cardiovascular: Negative.  Negative for chest pain and palpitations.  Gastrointestinal: Negative.  Negative for abdominal pain, nausea and vomiting.  Genitourinary: Negative.  Negative for dysuria.  Musculoskeletal:  Positive for joint pain.  Skin: Negative.  Negative for rash.  Neurological: Negative.  Negative for dizziness and headaches.  All other systems reviewed and are negative.  Today's Vitals   05/10/22 0834  BP: 132/86  Pulse: 61  Temp: 97.9 F (36.6 C)  TempSrc: Oral  SpO2: 93%  Weight: 189 lb 2 oz (85.8 kg)  Height: 5\' 3"  (1.6 m)   Body mass index is 33.5 kg/m.   Physical Exam Vitals reviewed.  Constitutional:      Appearance: Normal appearance.  HENT:     Head: Normocephalic.     Mouth/Throat:     Mouth: Mucous membranes are moist.     Pharynx: Oropharynx is clear.  Eyes:     Extraocular Movements: Extraocular movements intact.     Conjunctiva/sclera: Conjunctivae normal.     Pupils: Pupils are equal, round, and reactive to light.  Cardiovascular:     Rate and Rhythm: Normal rate and regular rhythm.     Pulses: Normal pulses.     Heart sounds: Normal heart sounds.  Pulmonary:     Effort: Pulmonary effort is normal.     Breath sounds: Normal breath sounds.  Abdominal:     Palpations:  Abdomen is soft.     Tenderness: There is no abdominal tenderness.  Musculoskeletal:        General: Deformity present.     Cervical back: No tenderness.  Lymphadenopathy:     Cervical: No cervical adenopathy.  Skin:    General: Skin is warm and dry.     Capillary Refill: Capillary refill takes less than 2 seconds.  Neurological:     General: No focal deficit present.     Mental Status: She is alert and oriented to person, place, and time.  Psychiatric:  Mood and Affect: Mood normal.        Behavior: Behavior normal.    Results for orders placed or performed in visit on 05/10/22 (from the past 24 hour(s))  POCT glycosylated hemoglobin (Hb A1C)     Status: Normal   Collection Time: 05/10/22  9:50 AM  Result Value Ref Range   Hemoglobin A1C 5.5 4.0 - 5.6 %   HbA1c POC (<> result, manual entry)     HbA1c, POC (prediabetic range)     HbA1c, POC (controlled diabetic range)        ASSESSMENT & PLAN: A total of 45 minutes was spent with the patient and counseling/coordination of care regarding preparing for this visit, review of most recent office visit notes, review of most recent rheumatologist office visit notes, review of multiple chronic medical problems and their management, review of all medications, education on nutrition, review of most recent blood work results including interpretation of today's hemoglobin A1c, prognosis, documentation and need for follow-up.   Problem List Items Addressed This Visit       Cardiovascular and Mediastinum   Hypertension associated with diabetes ([place]) - Primary    Well-controlled hypertension. BP Readings from Last 3 Encounters:  05/10/22 132/86  02/02/22 (!) 147/83  11/04/21 118/78  Continue metoprolol tartrate 25 mg twice a day and losartan 100 mg daily. Well-controlled diabetes with hemoglobin A1c of 5.5. Continue metformin 500 mg twice a day. Cardiovascular risks associated with hypertension and diabetes discussed. Diet and  nutrition discussed. Follow-up in 6 months.       Relevant Medications   metFORMIN (GLUCOPHAGE) 500 MG tablet   metoprolol tartrate (LOPRESSOR) 25 MG tablet   Obesity, diabetes, and hypertension syndrome (HCC)   Relevant Medications   metFORMIN (GLUCOPHAGE) 500 MG tablet   metoprolol tartrate (LOPRESSOR) 25 MG tablet     Endocrine   Hypothyroid    Clinically euthyroid. Continue Synthroid 75 mcg daily.       Relevant Medications   levothyroxine (SYNTHROID) 75 MCG tablet   metoprolol tartrate (LOPRESSOR) 25 MG tablet     Musculoskeletal and Integument   Rheumatoid arthritis involving multiple sites with positive rheumatoid factor (HCC)    Clinically stable.  Sees Dr. Estanislado Pandy, rheumatologist, on a regular basis. Continue Plaquenil as prescribed by rheumatologist.        Other   Hyperlipidemia    Stable.  Diet and nutrition discussed. Continue rosuvastatin 10 mg daily. The 10-year ASCVD risk score (Arnett DK, et al., 2019) is: 37.4%   Values used to calculate the score:     Age: 57 years     Sex: Female     Is Non-Hispanic African American: No     Diabetic: Yes     Tobacco smoker: No     Systolic Blood Pressure: 782 mmHg     Is BP treated: Yes     HDL Cholesterol: 39.7 mg/dL     Total Cholesterol: 142 mg/dL       Relevant Medications   metoprolol tartrate (LOPRESSOR) 25 MG tablet   History of gout    Stable.  Only takes colchicine for flareups.  Intolerant to allopurinol. Continue probenecid 500 mg 3 times a day.      Other Visit Diagnoses     Type 2 diabetes mellitus without complication, without long-term current use of insulin (HCC)       Relevant Medications   metFORMIN (GLUCOPHAGE) 500 MG tablet   Other Relevant Orders   POCT glycosylated hemoglobin (Hb  A1C) (Completed)   Essential hypertension       Relevant Medications   metoprolol tartrate (LOPRESSOR) 25 MG tablet   Need for vaccination       Relevant Orders   Flu Vaccine QUAD High  Dose(Fluad)      Patient Instructions  Health Maintenance After Age 74 After age 73, you are at a higher risk for certain long-term diseases and infections as well as injuries from falls. Falls are a major cause of broken bones and head injuries in people who are older than age 23. Getting regular preventive care can help to keep you healthy and well. Preventive care includes getting regular testing and making lifestyle changes as recommended by your health care provider. Talk with your health care provider about: Which screenings and tests you should have. A screening is a test that checks for a disease when you have no symptoms. A diet and exercise plan that is right for you. What should I know about screenings and tests to prevent falls? Screening and testing are the best ways to find a health problem early. Early diagnosis and treatment give you the best chance of managing medical conditions that are common after age 36. Certain conditions and lifestyle choices may make you more likely to have a fall. Your health care provider may recommend: Regular vision checks. Poor vision and conditions such as cataracts can make you more likely to have a fall. If you wear glasses, make sure to get your prescription updated if your vision changes. Medicine review. Work with your health care provider to regularly review all of the medicines you are taking, including over-the-counter medicines. Ask your health care provider about any side effects that may make you more likely to have a fall. Tell your health care provider if any medicines that you take make you feel dizzy or sleepy. Strength and balance checks. Your health care provider may recommend certain tests to check your strength and balance while standing, walking, or changing positions. Foot health exam. Foot pain and numbness, as well as not wearing proper footwear, can make you more likely to have a fall. Screenings, including: Osteoporosis  screening. Osteoporosis is a condition that causes the bones to get weaker and break more easily. Blood pressure screening. Blood pressure changes and medicines to control blood pressure can make you feel dizzy. Depression screening. You may be more likely to have a fall if you have a fear of falling, feel depressed, or feel unable to do activities that you used to do. Alcohol use screening. Using too much alcohol can affect your balance and may make you more likely to have a fall. Follow these instructions at home: Lifestyle Do not drink alcohol if: Your health care provider tells you not to drink. If you drink alcohol: Limit how much you have to: 0-1 drink a day for women. 0-2 drinks a day for men. Know how much alcohol is in your drink. In the [place] one drink equals one 12 oz bottle of beer (355 mL), one 5 oz glass of wine (148 mL), or one 1 oz glass of hard liquor (44 mL). Do not use any products that contain nicotine or tobacco. These products include cigarettes, chewing tobacco, and vaping devices, such as e-cigarettes. If you need help quitting, ask your health care provider. Activity  Follow a regular exercise program to stay fit. This will help you maintain your balance. Ask your health care provider what types of exercise are appropriate for you. If you  need a cane or walker, use it as recommended by your health care provider. Wear supportive shoes that have nonskid soles. Safety  Remove any tripping hazards, such as rugs, cords, and clutter. Install safety equipment such as grab bars in bathrooms and safety rails on stairs. Keep rooms and walkways well-lit. General instructions Talk with your health care provider about your risks for falling. Tell your health care provider if: You fall. Be sure to tell your health care provider about all falls, even ones that seem minor. You feel dizzy, tiredness (fatigue), or off-balance. Take over-the-counter and prescription medicines only  as told by your health care provider. These include supplements. Eat a healthy diet and maintain a healthy weight. A healthy diet includes low-fat dairy products, low-fat (lean) meats, and fiber from whole grains, beans, and lots of fruits and vegetables. Stay current with your vaccines. Schedule regular health, dental, and eye exams. Summary Having a healthy lifestyle and getting preventive care can help to protect your health and wellness after age 38. Screening and testing are the best way to find a health problem early and help you avoid having a fall. Early diagnosis and treatment give you the best chance for managing medical conditions that are more common for people who are older than age 67. Falls are a major cause of broken bones and head injuries in people who are older than age 58. Take precautions to prevent a fall at home. Work with your health care provider to learn what changes you can make to improve your health and wellness and to prevent falls. This information is not intended to replace advice given to you by your health care provider. Make sure you discuss any questions you have with your health care provider. Document Revised: 12/29/2020 Document Reviewed: 12/29/2020 Elsevier Patient Education  [place], MD [facility]

## 2022-05-10 NOTE — Patient Instructions (Signed)
Health Maintenance After Age 75 After age 75, you are at a higher risk for certain long-term diseases and infections as well as injuries from falls. Falls are a major cause of broken bones and head injuries in people who are older than age 75. Getting regular preventive care can help to keep you healthy and well. Preventive care includes getting regular testing and making lifestyle changes as recommended by your health care provider. Talk with your health care provider about: Which screenings and tests you should have. A screening is a test that checks for a disease when you have no symptoms. A diet and exercise plan that is right for you. What should I know about screenings and tests to prevent falls? Screening and testing are the best ways to find a health problem early. Early diagnosis and treatment give you the best chance of managing medical conditions that are common after age 75. Certain conditions and lifestyle choices may make you more likely to have a fall. Your health care provider may recommend: Regular vision checks. Poor vision and conditions such as cataracts can make you more likely to have a fall. If you wear glasses, make sure to get your prescription updated if your vision changes. Medicine review. Work with your health care provider to regularly review all of the medicines you are taking, including over-the-counter medicines. Ask your health care provider about any side effects that may make you more likely to have a fall. Tell your health care provider if any medicines that you take make you feel dizzy or sleepy. Strength and balance checks. Your health care provider may recommend certain tests to check your strength and balance while standing, walking, or changing positions. Foot health exam. Foot pain and numbness, as well as not wearing proper footwear, can make you more likely to have a fall. Screenings, including: Osteoporosis screening. Osteoporosis is a condition that causes  the bones to get weaker and break more easily. Blood pressure screening. Blood pressure changes and medicines to control blood pressure can make you feel dizzy. Depression screening. You may be more likely to have a fall if you have a fear of falling, feel depressed, or feel unable to do activities that you used to do. Alcohol use screening. Using too much alcohol can affect your balance and may make you more likely to have a fall. Follow these instructions at home: Lifestyle Do not drink alcohol if: Your health care provider tells you not to drink. If you drink alcohol: Limit how much you have to: 0-1 drink a day for women. 0-2 drinks a day for men. Know how much alcohol is in your drink. In the [place] one drink equals one 12 oz bottle of beer (355 mL), one 5 oz glass of wine (148 mL), or one 1 oz glass of hard liquor (44 mL). Do not use any products that contain nicotine or tobacco. These products include cigarettes, chewing tobacco, and vaping devices, such as e-cigarettes. If you need help quitting, ask your health care provider. Activity  Follow a regular exercise program to stay fit. This will help you maintain your balance. Ask your health care provider what types of exercise are appropriate for you. If you need a cane or walker, use it as recommended by your health care provider. Wear supportive shoes that have nonskid soles. Safety  Remove any tripping hazards, such as rugs, cords, and clutter. Install safety equipment such as grab bars in bathrooms and safety rails on stairs. Keep rooms and walkways   well-lit. General instructions Talk with your health care provider about your risks for falling. Tell your health care provider if: You fall. Be sure to tell your health care provider about all falls, even ones that seem minor. You feel dizzy, tiredness (fatigue), or off-balance. Take over-the-counter and prescription medicines only as told by your health care provider. These include  supplements. Eat a healthy diet and maintain a healthy weight. A healthy diet includes low-fat dairy products, low-fat (lean) meats, and fiber from whole grains, beans, and lots of fruits and vegetables. Stay current with your vaccines. Schedule regular health, dental, and eye exams. Summary Having a healthy lifestyle and getting preventive care can help to protect your health and wellness after age 75. Screening and testing are the best way to find a health problem early and help you avoid having a fall. Early diagnosis and treatment give you the best chance for managing medical conditions that are more common for people who are older than age 75. Falls are a major cause of broken bones and head injuries in people who are older than age 75. Take precautions to prevent a fall at home. Work with your health care provider to learn what changes you can make to improve your health and wellness and to prevent falls. This information is not intended to replace advice given to you by your health care provider. Make sure you discuss any questions you have with your health care provider. Document Revised: 12/29/2020 Document Reviewed: 12/29/2020 Elsevier Patient Education  2023 Elsevier Inc[employer]  

## 2022-05-10 NOTE — Telephone Encounter (Signed)
Called patient and informed patient that her medication was sent to [facility]

## 2022-05-10 NOTE — Telephone Encounter (Signed)
Patient had an appointment today and while she was checking out she saw that the 3 medications sent in were [facility] but she said she needs them sent to [facility]. levothyroxine (SYNTHROID) 75 MCG tablet metFORMIN (GLUCOPHAGE) 500 MG tablet metoprolol tartrate (LOPRESSOR) 25 MG tablet

## 2022-06-02 ENCOUNTER — Other Ambulatory Visit: Payer: Self-pay | Admitting: Physician Assistant

## 2022-06-02 DIAGNOSIS — M79641 Pain in right hand: Secondary | ICD-10-CM

## 2022-06-02 DIAGNOSIS — M0579 Rheumatoid arthritis with rheumatoid factor of multiple sites without organ or systems involvement: Secondary | ICD-10-CM

## 2022-06-02 DIAGNOSIS — M79671 Pain in right foot: Secondary | ICD-10-CM

## 2022-06-02 NOTE — Telephone Encounter (Signed)
Next Visit: 07/14/2022   Last Visit: 02/02/2022   Labs: 02/02/2022 CBC and CMP are normal.    Eye exam: 01/25/2022 WNL     Current Dose per office note 02/02/2022: Plaquenil 200 mg 1 tablet by mouth twice daily Monday to Friday   DX: Rheumatoid arthritis involving multiple sites with positive rheumatoid factor    Last Fill: 03/08/2022   Okay to refill Plaquenil?

## 2022-06-20 ENCOUNTER — Other Ambulatory Visit: Payer: Self-pay | Admitting: Emergency Medicine

## 2022-06-20 DIAGNOSIS — E785 Hyperlipidemia, unspecified: Secondary | ICD-10-CM

## 2022-06-20 DIAGNOSIS — I10 Essential (primary) hypertension: Secondary | ICD-10-CM

## 2022-06-30 NOTE — Progress Notes (Deleted)
Office Visit Note  Patient: Isabella Dixon             Date of Birth: 1946-05-24           MRN: [id number]             PCP: Georgina Quint, MD Referring: Georgina Quint, * Visit Date: 07/14/2022 Occupation: @GUAROCC @  Subjective:  No chief complaint on file.   History of Present Illness: Isabella Dixon is a 76 y.o. female ***   Activities of Daily Living:  Patient reports morning stiffness for *** {minute/hour:19697}.   Patient {ACTIONS;DENIES/REPORTS:21021675::"Denies"} nocturnal pain.  Difficulty dressing/grooming: {ACTIONS;DENIES/REPORTS:21021675::"Denies"} Difficulty climbing stairs: {ACTIONS;DENIES/REPORTS:21021675::"Denies"} Difficulty getting out of chair: {ACTIONS;DENIES/REPORTS:21021675::"Denies"} Difficulty using hands for taps, buttons, cutlery, and/or writing: {ACTIONS;DENIES/REPORTS:21021675::"Denies"}  No Rheumatology ROS completed.   PMFS History:  Patient Active Problem List   Diagnosis Date Noted   Rheumatoid arthritis involving multiple sites with positive rheumatoid factor (HCC) 11/04/2021   Polyarthralgia 07/17/2021   Osteoporosis 05/05/2021   Primary osteoarthritis of both hands 06/21/2019   Primary osteoarthritis of both feet 06/21/2019   Primary osteoarthritis of both knees 06/21/2019   History of sciatica 09/06/2017   Obesity, diabetes, and hypertension syndrome (HCC) 09/06/2017   History of gout 09/06/2017   Diabetes mellitus type 2 in obese (HCC) 05/13/2015   Hypothyroid 07/25/2012   Gouty arthritis 05/02/2012   Osteoarthritis 05/02/2012   Hypertension associated with diabetes (HCC) 05/02/2012   Hyperlipidemia 05/02/2012   Abnormal Papanicolaou smear of vagina 05/02/2012    Past Medical History:  Diagnosis Date   Anemia    Arthritis    Diabetes mellitus without complication (HCC)    Gout    High cholesterol    Hypertension    Osteoporosis     Family History  Problem Relation Age of Onset   Heart disease  Mother    Stroke Mother    Hypertension Mother    Stroke Father    Colon cancer Sister    SIDS Son    Stroke Paternal Grandmother    Cancer Paternal Grandmother    ALS Brother    Stroke Maternal Grandmother    Cancer Paternal Grandfather    Testicular cancer Son    Down syndrome Son    Heart disease Brother    Past Surgical History:  Procedure Laterality Date   ABDOMINAL HYSTERECTOMY     TUBAL LIGATION     Social History   Social History Narrative   Not on file   Immunization History  Administered Date(s) Administered   Fluad Quad(high Dose 65+) 05/02/2019, 04/30/2020, 05/05/2021, 05/10/2022   Influenza Split 05/02/2012   Influenza, High Dose Seasonal PF 06/15/2018   Influenza,inj,Quad PF,6+ Mos 05/08/2013, 04/30/2014, 05/13/2015, 05/17/2016, 09/06/2017   PFIZER(Purple Top)SARS-COV-2 Vaccination 09/17/2019, 10/08/2019, 06/05/2020, 06/04/2021   Pneumococcal Conjugate-13 05/13/2015   Pneumococcal Polysaccharide-23 11/01/2018   Tdap 03/14/2008     Objective: Vital Signs: There were no vitals taken for this visit.   Physical Exam   Musculoskeletal Exam: ***  CDAI Exam: CDAI Score: -- Patient Global: --; Provider Global: -- Swollen: --; Tender: -- Joint Exam 07/14/2022   No joint exam has been documented for this visit   There is currently no information documented on the homunculus. Go to the Rheumatology activity and complete the homunculus joint exam.  Investigation: No additional findings.  Imaging: No results found.  Recent Labs: Lab Results  Component Value Date   WBC 7.0 02/02/2022   HGB 12.7 02/02/2022   PLT 194  02/02/2022   NA 139 02/02/2022   K 4.4 02/02/2022   CL 101 02/02/2022   CO2 26 02/02/2022   GLUCOSE 83 02/02/2022   BUN 21 02/02/2022   CREATININE 0.80 02/02/2022   BILITOT 0.3 02/02/2022   ALKPHOS 74 08/25/2021   AST 21 02/02/2022   ALT 19 02/02/2022   PROT 8.2 (H) 02/02/2022   ALBUMIN 3.8 08/25/2021   CALCIUM 9.8 02/02/2022    GFRAA 86 07/02/2020    Speciality Comments: PLQ Eye Exam 01/25/2022 WNL @ [facility] Follow up in 1 year PLQ 09/28/2021 Allopurinol- diarrhea  Procedures:  No procedures performed Allergies: Patient has no known allergies.   Assessment / Plan:     Visit Diagnoses: No diagnosis found.  Orders: No orders of the defined types were placed in this encounter.  No orders of the defined types were placed in this encounter.   Face-to-face time spent with patient was *** minutes. Greater than 50% of time was spent in counseling and coordination of care.  Follow-Up Instructions: No follow-ups on file.   Ellen Henri, CMA  Note - This record has been created using Animal nutritionist.  Chart creation errors have been sought, but may not always  have been located. Such creation errors do not reflect on  the standard of medical care.

## 2022-07-09 ENCOUNTER — Other Ambulatory Visit: Payer: Self-pay | Admitting: Emergency Medicine

## 2022-07-09 DIAGNOSIS — Z8669 Personal history of other diseases of the nervous system and sense organs: Secondary | ICD-10-CM

## 2022-07-13 NOTE — Progress Notes (Signed)
Office Visit Note  Patient: [name]             Date of Birth: 1945-08-27           MRN: [id number]             PCP: Georgina Quint, MD Referring: Georgina Quint, * Visit Date: 07/27/2022 Occupation: @GUAROCC @  Subjective:  Medication management  History of Present Illness: Isabella Dixon is a 76 y.o. female with history of seropositive rheumatoid arthritis, osteoarthritis, degenerative disc disease and gout.  She states she continues to have some intermittent discomfort in her bilateral palms.  She also has noticed swelling on her feet without any discomfort.  None of the other joints are painful or swollen.  She has been taking hydroxychloroquine on a regular basis.  She states she had her COVID-19 virus infection about 3 to 4 weeks ago which improved itself without any medications.  She states she had residual cough for a while.  She has not had a gout flare.  She continues to have some discomfort in her feet.  She denies any neck or lower back pain today.  She has been taking probenecid.  She takes colchicine only on as needed basis.  Activities of Daily Living:  Patient reports morning stiffness for 0  none .   Patient Denies nocturnal pain.  Difficulty dressing/grooming: Denies Difficulty climbing stairs: Denies Difficulty getting out of chair: Denies Difficulty using hands for taps, buttons, cutlery, and/or writing: Reports  Review of Systems  Constitutional:  Negative for fatigue.  HENT:  Negative for mouth sores and mouth dryness.   Eyes:  Positive for dryness.  Respiratory:  Negative for shortness of breath.   Cardiovascular:  Negative for chest pain and palpitations.  Gastrointestinal:  Positive for diarrhea. Negative for blood in stool and constipation.  Endocrine: Negative for increased urination.  Genitourinary:  Negative for involuntary urination.  Musculoskeletal:  Positive for joint pain, gait problem, joint pain and joint swelling.  Negative for myalgias, muscle weakness, morning stiffness, muscle tenderness and myalgias.  Skin:  Negative for color change, rash, hair loss and sensitivity to sunlight.  Allergic/Immunologic: Negative for susceptible to infections.  Neurological:  Negative for dizziness and headaches.  Hematological:  Negative for swollen glands.  Psychiatric/Behavioral:  Negative for depressed mood and sleep disturbance. The patient is not nervous/anxious.     PMFS History:  Patient Active Problem List   Diagnosis Date Noted   Rheumatoid arthritis involving multiple sites with positive rheumatoid factor (HCC) 11/04/2021   Polyarthralgia 07/17/2021   Osteoporosis 05/05/2021   Primary osteoarthritis of both hands 06/21/2019   Primary osteoarthritis of both feet 06/21/2019   Primary osteoarthritis of both knees 06/21/2019   History of sciatica 09/06/2017   Obesity, diabetes, and hypertension syndrome (HCC) 09/06/2017   History of gout 09/06/2017   Diabetes mellitus type 2 in obese (HCC) 05/13/2015   Hypothyroid 07/25/2012   Gouty arthritis 05/02/2012   Osteoarthritis 05/02/2012   Hypertension associated with diabetes (HCC) 05/02/2012   Hyperlipidemia 05/02/2012   Abnormal Papanicolaou smear of vagina 05/02/2012    Past Medical History:  Diagnosis Date   Anemia    Arthritis    Diabetes mellitus without complication (HCC)    Gout    High cholesterol    Hypertension    Osteoporosis     Family History  Problem Relation Age of Onset   Heart disease Mother    Stroke Mother    Hypertension Mother  Stroke Father    Colon cancer Sister    SIDS Son    Stroke Paternal Grandmother    Cancer Paternal Grandmother    ALS Brother    Stroke Maternal Grandmother    Cancer Paternal Grandfather    Testicular cancer Son    Down syndrome Son    Heart disease Brother    Past Surgical History:  Procedure Laterality Date   ABDOMINAL HYSTERECTOMY     TUBAL LIGATION     Social History   Social  History Narrative   Not on file   Immunization History  Administered Date(s) Administered   Fluad Quad(high Dose 65+) 05/02/2019, 04/30/2020, 05/05/2021, 05/10/2022   Influenza Split 05/02/2012   Influenza, High Dose Seasonal PF 06/15/2018   Influenza,inj,Quad PF,6+ Mos 05/08/2013, 04/30/2014, 05/13/2015, 05/17/2016, 09/06/2017   PFIZER(Purple Top)SARS-COV-2 Vaccination 09/17/2019, 10/08/2019, 06/05/2020, 06/04/2021   Pneumococcal Conjugate-13 05/13/2015   Pneumococcal Polysaccharide-23 11/01/2018   Tdap 03/14/2008     Objective: Vital Signs: BP (!) 180/103 (BP Location: Left Arm, Patient Position: Sitting, Cuff Size: Normal)   Pulse 63   Resp 16   Ht 5\' 3"  (1.6 m)   Wt 192 lb (87.1 kg)   BMI 34.01 kg/m    Physical Exam Vitals and nursing note reviewed.  Constitutional:      Appearance: She is well-developed.  HENT:     Head: Normocephalic and atraumatic.  Eyes:     Conjunctiva/sclera: Conjunctivae normal.  Cardiovascular:     Rate and Rhythm: Normal rate and regular rhythm.     Heart sounds: Normal heart sounds.  Pulmonary:     Effort: Pulmonary effort is normal.     Breath sounds: Normal breath sounds.  Abdominal:     General: Bowel sounds are normal.     Palpations: Abdomen is soft.  Musculoskeletal:     Cervical back: Normal range of motion.  Lymphadenopathy:     Cervical: No cervical adenopathy.  Skin:    General: Skin is warm and dry.     Capillary Refill: Capillary refill takes less than 2 seconds.  Neurological:     Mental Status: She is alert and oriented to person, place, and time.  Psychiatric:        Behavior: Behavior normal.      Musculoskeletal Exam: She had limited lateral rotation of the spine.  She had discomfort range of motion of her lumbar spine.  Shoulders, elbows, wrists were good good range of motion.  There was no synovitis over wrist joints.  PIP and DIP thickening was noted bilaterally.  Hip joints and knee joints with good range of  motion.  She had no synovitis or tenderness over ankles.  Bilateral PIP and DIP thickening was noted in her feet.  CDAI Exam: CDAI Score: -- Patient Global: 3 mm; Provider Global: 3 mm Swollen: --; Tender: -- Joint Exam 07/27/2022   No joint exam has been documented for this visit   There is currently no information documented on the homunculus. Go to the Rheumatology activity and complete the homunculus joint exam.  Investigation: No additional findings.  Imaging: No results found.  Recent Labs: Lab Results  Component Value Date   WBC 7.0 02/02/2022   HGB 12.7 02/02/2022   PLT 194 02/02/2022   NA 139 02/02/2022   K 4.4 02/02/2022   CL 101 02/02/2022   CO2 26 02/02/2022   GLUCOSE 83 02/02/2022   BUN 21 02/02/2022   CREATININE 0.80 02/02/2022   BILITOT 0.3 02/02/2022  ALKPHOS 74 08/25/2021   AST 21 02/02/2022   ALT 19 02/02/2022   PROT 8.2 (H) 02/02/2022   ALBUMIN 3.8 08/25/2021   CALCIUM 9.8 02/02/2022   GFRAA 86 07/02/2020    Speciality Comments: PLQ Eye Exam 01/25/2022 WNL @ [facility] Follow up in 1 year PLQ 09/28/2021 Allopurinol- diarrhea  Procedures:  No procedures performed Allergies: Patient has no known allergies.   Assessment / Plan:     Visit Diagnoses: Rheumatoid arthritis involving multiple sites with positive rheumatoid factor (HCC) - RF+, responsive to prednisone use: -Patient had no synovitis on examination.  She gives history of intermittent discomfort in her hands and her feet.  No synovitis was noted over wrist joints, MCPs, ankles or MTPs.  Plan: Sedimentation rate  High risk medication use - Plaquenil 200 mg 1 tablet by mouth twice daily Monday to Friday. PLQ Eye Exam 01/25/2022 - Plan: CBC with Differential/Platelet, COMPLETE METABOLIC PANEL WITH GFR today.  We will contact her once the lab results are available.  Primary osteoarthritis of both hands - she has severe osteoarthritis in her hands with incomplete fist formation.   Joint protection muscle strengthening was discussed.  A handout on hand exercises was given.  Primary osteoarthritis of both knees-she denies any discomfort in her knee joints.  Primary osteoarthritis of both feet-she has been noticing swelling in her ankles worsening of the day.  She had no pitting edema.  No synovitis was noted on the examination.  Neck pain -she had limited range of motion of the cervical spine.  X-rays consistent with multilevel spondylosis and facet joint arthropathy.  Most severe narrowing was noted between C5-C6 and C6-C7 with anterior osteophytes.  DDD (degenerative disc disease), lumbar-she continues to have discomfort in her lumbar region.  Gouty arthritis -she denies having a flare of gout.  She has been on probenecid 500 mg 3 times daily and Mitigare 0.6 mg BID as needed during gout flares.  Patient has not taken Medicare in several months.- Plan: Uric acid  Osteopenia of multiple sites - DEXA done by PCP 09/28/19 T -2.3.  Repeat DEXA scan is pending.  Calcium rich diet and vitamin D was advised.  Essential hypertension-blood pressure was elevated at 163/90.  She is on losartan and metoprolol.  Repeat blood pressure was 180/103.  She was advised to monitor blood pressure closely and follow-up with her PCP.  I advised her to contact her PCP for elevated blood pressure reading.  Other medical problems listed as follows:  History of hyperlipidemia  History of hypothyroidism  Type 2 diabetes mellitus without complication, without long-term current use of insulin ([place])  Orders: Orders Placed This Encounter  Procedures   CBC with Differential/Platelet   COMPLETE METABOLIC PANEL WITH GFR   Sedimentation rate   Uric acid   No orders of the defined types were placed in this encounter.    Follow-Up Instructions: Return in about 5 months (around 12/26/2022) for Rheumatoid arthritis, Osteoarthritis, Gout.   Bo Merino, MD  Note - This record has been  created using Editor, commissioning.  Chart creation errors have been sought, but may not always  have been located. Such creation errors do not reflect on  the standard of medical care.

## 2022-07-14 ENCOUNTER — Ambulatory Visit: Payer: Medicare Other | Admitting: Rheumatology

## 2022-07-14 DIAGNOSIS — Z79899 Other long term (current) drug therapy: Secondary | ICD-10-CM

## 2022-07-14 DIAGNOSIS — M5136 Other intervertebral disc degeneration, lumbar region: Secondary | ICD-10-CM

## 2022-07-14 DIAGNOSIS — M170 Bilateral primary osteoarthritis of knee: Secondary | ICD-10-CM

## 2022-07-14 DIAGNOSIS — M109 Gout, unspecified: Secondary | ICD-10-CM

## 2022-07-14 DIAGNOSIS — E119 Type 2 diabetes mellitus without complications: Secondary | ICD-10-CM

## 2022-07-14 DIAGNOSIS — M8589 Other specified disorders of bone density and structure, multiple sites: Secondary | ICD-10-CM

## 2022-07-14 DIAGNOSIS — M19041 Primary osteoarthritis, right hand: Secondary | ICD-10-CM

## 2022-07-14 DIAGNOSIS — M19071 Primary osteoarthritis, right ankle and foot: Secondary | ICD-10-CM

## 2022-07-14 DIAGNOSIS — I10 Essential (primary) hypertension: Secondary | ICD-10-CM

## 2022-07-14 DIAGNOSIS — M0579 Rheumatoid arthritis with rheumatoid factor of multiple sites without organ or systems involvement: Secondary | ICD-10-CM

## 2022-07-14 DIAGNOSIS — M542 Cervicalgia: Secondary | ICD-10-CM

## 2022-07-14 DIAGNOSIS — Z8639 Personal history of other endocrine, nutritional and metabolic disease: Secondary | ICD-10-CM

## 2022-07-22 ENCOUNTER — Telehealth: Payer: Self-pay | Admitting: Emergency Medicine

## 2022-07-22 NOTE — Telephone Encounter (Signed)
Left message for patient to call back to schedule Medicare Annual Wellness Visit   No hx of AWV eligible as of 01/21/13  Please schedule at anytime with [facility] Advisor if patient calls the office back.     Any questions, please call me at [phone]

## 2022-07-27 ENCOUNTER — Other Ambulatory Visit: Payer: Self-pay | Admitting: Emergency Medicine

## 2022-07-27 ENCOUNTER — Ambulatory Visit: Payer: Medicare Other | Attending: Rheumatology | Admitting: Rheumatology

## 2022-07-27 ENCOUNTER — Encounter: Payer: Self-pay | Admitting: Rheumatology

## 2022-07-27 VITALS — BP 180/103 | HR 63 | Resp 16 | Ht 63.0 in | Wt 192.0 lb

## 2022-07-27 DIAGNOSIS — M170 Bilateral primary osteoarthritis of knee: Secondary | ICD-10-CM | POA: Diagnosis not present

## 2022-07-27 DIAGNOSIS — M0579 Rheumatoid arthritis with rheumatoid factor of multiple sites without organ or systems involvement: Secondary | ICD-10-CM | POA: Diagnosis not present

## 2022-07-27 DIAGNOSIS — M19072 Primary osteoarthritis, left ankle and foot: Secondary | ICD-10-CM

## 2022-07-27 DIAGNOSIS — M5136 Other intervertebral disc degeneration, lumbar region: Secondary | ICD-10-CM

## 2022-07-27 DIAGNOSIS — Z79899 Other long term (current) drug therapy: Secondary | ICD-10-CM | POA: Diagnosis not present

## 2022-07-27 DIAGNOSIS — M19071 Primary osteoarthritis, right ankle and foot: Secondary | ICD-10-CM

## 2022-07-27 DIAGNOSIS — M51369 Other intervertebral disc degeneration, lumbar region without mention of lumbar back pain or lower extremity pain: Secondary | ICD-10-CM

## 2022-07-27 DIAGNOSIS — E119 Type 2 diabetes mellitus without complications: Secondary | ICD-10-CM

## 2022-07-27 DIAGNOSIS — Z8639 Personal history of other endocrine, nutritional and metabolic disease: Secondary | ICD-10-CM

## 2022-07-27 DIAGNOSIS — M109 Gout, unspecified: Secondary | ICD-10-CM

## 2022-07-27 DIAGNOSIS — Z8669 Personal history of other diseases of the nervous system and sense organs: Secondary | ICD-10-CM

## 2022-07-27 DIAGNOSIS — M8589 Other specified disorders of bone density and structure, multiple sites: Secondary | ICD-10-CM

## 2022-07-27 DIAGNOSIS — M19041 Primary osteoarthritis, right hand: Secondary | ICD-10-CM | POA: Diagnosis not present

## 2022-07-27 DIAGNOSIS — M542 Cervicalgia: Secondary | ICD-10-CM

## 2022-07-27 DIAGNOSIS — M19042 Primary osteoarthritis, left hand: Secondary | ICD-10-CM

## 2022-07-27 DIAGNOSIS — I10 Essential (primary) hypertension: Secondary | ICD-10-CM

## 2022-07-27 NOTE — Patient Instructions (Addendum)
Vaccines You are taking a medication(s) that can suppress your immune system.  The following immunizations are recommended: Flu annually Covid-19  Td/Tdap (tetanus, diphtheria, pertussis) every 10 years Pneumonia (Prevnar 15 then Pneumovax 23 at least 1 year apart.  Alternatively, can take Prevnar 20 without needing additional dose) Shingrix: 2 doses from 4 weeks to 6 months apart  Please check with your PCP to make sure you are up to date.  Hand Exercises Hand exercises can be helpful for almost anyone. These exercises can strengthen the hands, improve flexibility and movement, and increase blood flow to the hands. These results can make work and daily tasks easier. Hand exercises can be especially helpful for people who have joint pain from arthritis or have nerve damage from overuse (carpal tunnel syndrome). These exercises can also help people who have injured a hand. Exercises Most of these hand exercises are gentle stretching and motion exercises. It is usually safe to do them often throughout the day. Warming up your hands before exercise may help to reduce stiffness. You can do this with gentle massage or by placing your hands in warm water for 10-15 minutes. It is normal to feel some stretching, pulling, tightness, or mild discomfort as you begin new exercises. This will gradually improve. Stop an exercise right away if you feel sudden, severe pain or your pain gets worse. Ask your health care provider which exercises are best for you. Knuckle bend or "claw" fist  Stand or sit with your arm, hand, and all five fingers pointed straight up. Make sure to keep your wrist straight during the exercise. Gently bend your fingers down toward your palm until the tips of your fingers are touching the top of your palm. Keep your big knuckle straight and just bend the small knuckles in your fingers. Hold this position for __________ seconds. Straighten (extend) your fingers back to the starting  position. Repeat this exercise 5-10 times with each hand. Full finger fist  Stand or sit with your arm, hand, and all five fingers pointed straight up. Make sure to keep your wrist straight during the exercise. Gently bend your fingers into your palm until the tips of your fingers are touching the middle of your palm. Hold this position for __________ seconds. Extend your fingers back to the starting position, stretching every joint fully. Repeat this exercise 5-10 times with each hand. Straight fist Stand or sit with your arm, hand, and all five fingers pointed straight up. Make sure to keep your wrist straight during the exercise. Gently bend your fingers at the big knuckle, where your fingers meet your hand, and the middle knuckle. Keep the knuckle at the tips of your fingers straight and try to touch the bottom of your palm. Hold this position for __________ seconds. Extend your fingers back to the starting position, stretching every joint fully. Repeat this exercise 5-10 times with each hand. Tabletop  Stand or sit with your arm, hand, and all five fingers pointed straight up. Make sure to keep your wrist straight during the exercise. Gently bend your fingers at the big knuckle, where your fingers meet your hand, as far down as you can while keeping the small knuckles in your fingers straight. Think of forming a tabletop with your fingers. Hold this position for __________ seconds. Extend your fingers back to the starting position, stretching every joint fully. Repeat this exercise 5-10 times with each hand. Finger spread  Place your hand flat on a table with your palm facing   down. Make sure your wrist stays straight as you do this exercise. Spread your fingers and thumb apart from each other as far as you can until you feel a gentle stretch. Hold this position for __________ seconds. Bring your fingers and thumb tight together again. Hold this position for __________ seconds. Repeat  this exercise 5-10 times with each hand. Making circles  Stand or sit with your arm, hand, and all five fingers pointed straight up. Make sure to keep your wrist straight during the exercise. Make a circle by touching the tip of your thumb to the tip of your index finger. Hold for __________ seconds. Then open your hand wide. Repeat this motion with your thumb and each finger on your hand. Repeat this exercise 5-10 times with each hand. Thumb motion  Sit with your forearm resting on a table and your wrist straight. Your thumb should be facing up toward the ceiling. Keep your fingers relaxed as you move your thumb. Lift your thumb up as high as you can toward the ceiling. Hold for __________ seconds. Bend your thumb across your palm as far as you can, reaching the tip of your thumb for the small finger (pinkie) side of your palm. Hold for __________ seconds. Repeat this exercise 5-10 times with each hand. Grip strengthening  Hold a stress ball or other soft ball in the middle of your hand. Slowly increase the pressure, squeezing the ball as much as you can without causing pain. Think of bringing the tips of your fingers into the middle of your palm. All of your finger joints should bend when doing this exercise. Hold your squeeze for __________ seconds, then relax. Repeat this exercise 5-10 times with each hand. Contact a health care provider if: Your hand pain or discomfort gets much worse when you do an exercise. Your hand pain or discomfort does not improve within 2 hours after you exercise. If you have any of these problems, stop doing these exercises right away. Do not do them again unless your health care provider says that you can. Get help right away if: You develop sudden, severe hand pain or swelling. If this happens, stop doing these exercises right away. Do not do them again unless your health care provider says that you can. This information is not intended to replace advice  given to you by your health care provider. Make sure you discuss any questions you have with your health care provider. Document Revised: 11/27/2020 Document Reviewed: 11/27/2020 Elsevier Patient Education  2023 [employer]  

## 2022-07-28 LAB — COMPLETE METABOLIC PANEL WITH GFR
AG Ratio: 1.1 (calc) (ref 1.0–2.5)
ALT: 18 U/L (ref 6–29)
AST: 24 U/L (ref 10–35)
Albumin: 4.4 g/dL (ref 3.6–5.1)
Alkaline phosphatase (APISO): 77 U/L (ref 37–153)
BUN: 21 mg/dL (ref 7–25)
CO2: 28 mmol/L (ref 20–32)
Calcium: 10.1 mg/dL (ref 8.6–10.4)
Chloride: 100 mmol/L (ref 98–110)
Creat: 0.89 mg/dL (ref 0.60–1.00)
Globulin: 3.9 g/dL (calc) — ABNORMAL HIGH (ref 1.9–3.7)
Glucose, Bld: 87 mg/dL (ref 65–99)
Potassium: 4.3 mmol/L (ref 3.5–5.3)
Sodium: 140 mmol/L (ref 135–146)
Total Bilirubin: 0.4 mg/dL (ref 0.2–1.2)
Total Protein: 8.3 g/dL — ABNORMAL HIGH (ref 6.1–8.1)
eGFR: 68 mL/min/{1.73_m2} (ref >=60)

## 2022-07-28 LAB — CBC WITH DIFFERENTIAL/PLATELET
Absolute Monocytes: 756 cells/uL (ref 200–950)
Basophils Absolute: 50 cells/uL (ref 0–200)
Basophils Relative: 0.8 %
Eosinophils Absolute: 211 cells/uL (ref 15–500)
Eosinophils Relative: 3.4 %
HCT: 38.6 % (ref 35.0–45.0)
Hemoglobin: 12.8 g/dL (ref 11.7–15.5)
Lymphs Abs: 2784 cells/uL (ref 850–3900)
MCH: 30.1 pg (ref 27.0–33.0)
MCHC: 33.2 g/dL (ref 32.0–36.0)
MCV: 90.8 fL (ref 80.0–100.0)
MPV: 11.3 fL (ref 7.5–12.5)
Monocytes Relative: 12.2 %
Neutro Abs: 2399 cells/uL (ref 1500–7800)
Neutrophils Relative %: 38.7 %
Platelets: 210 10*3/uL (ref 140–400)
RBC: 4.25 10*6/uL (ref 3.80–5.10)
RDW: 13.4 % (ref 11.0–15.0)
Total Lymphocyte: 44.9 %
WBC: 6.2 10*3/uL (ref 3.8–10.8)

## 2022-07-28 LAB — SEDIMENTATION RATE: Sed Rate: 28 mm/h (ref 0–30)

## 2022-07-28 LAB — URIC ACID: Uric Acid, Serum: 4.4 mg/dL (ref 2.5–7.0)

## 2022-07-28 NOTE — Progress Notes (Signed)
Uric acid is 4.4 which is a the desirable range.  Sedimentation rate is normal.  CBC is normal.  CMP is normal.

## 2022-08-03 ENCOUNTER — Ambulatory Visit (INDEPENDENT_AMBULATORY_CARE_PROVIDER_SITE_OTHER): Payer: Medicare Other

## 2022-08-03 VITALS — Ht 63.0 in | Wt 192.0 lb

## 2022-08-03 DIAGNOSIS — Z0000 Encounter for general adult medical examination without abnormal findings: Secondary | ICD-10-CM

## 2022-08-03 NOTE — Progress Notes (Signed)
Virtual Visit via Telephone Note  I connected with  Isabella Dixon on 08/03/22 at  9:00 AM EST by telephone and verified that I am speaking with the correct person using two identifiers.  Location: Patient: Home Provider: [place] Persons participating in the virtual visit: [place]   I discussed the limitations, risks, security and privacy concerns of performing an evaluation and management service by telephone and the availability of in person appointments. The patient expressed understanding and agreed to proceed.  Interactive audio and video telecommunications were attempted between this nurse and patient, however failed, due to patient having technical difficulties OR patient did not have access to video capability.  We continued and completed visit with audio only.  Some vital signs may be absent or patient reported.   Sheral Flow, LPN  Subjective:   Isabella Dixon is a 76 y.o. female who presents for an Initial Medicare Annual Wellness Visit.  Review of Systems     Cardiac Risk Factors include: advanced age (>5mn, >>50women);dyslipidemia;diabetes mellitus;hypertension;obesity (BMI >30kg/m2);family history of premature cardiovascular disease     Objective:    Today's Vitals   08/03/22 0904  Weight: 192 lb (87.1 kg)  Height: _0  (1.6 m)  PainSc: 0-No pain   Body mass index is 34.01 kg/m.     08/03/2022    9:07 AM 07/08/2021   10:00 PM 09/28/2017    2:25 PM 02/11/2017    2:40 PM 11/15/2015    5:27 PM  Advanced Directives  Does Patient Have a Medical Advance Directive? _1   Would patient like information on creating a medical advance directive? No - Patient declined No - Patient declined Yes (MAU/Ambulatory/Procedural Areas - Information given) No - Patient declined No - patient declined information    Current Medications (verified) Outpatient Encounter Medications as of 08/03/2022  Medication Sig    chlorpheniramine (CHLOR-TRIMETON) 4 MG tablet Take 4 mg by mouth 2 (two) times daily as needed for allergies.   Cholecalciferol (VITAMIN D-3 PO) Take 2,000 Units by mouth daily.   Colchicine (MITIGARE) 0.6 MG CAPS TAKE 2 CAPSULES BY MOUTH DAILY AS NEEDED   hydroxychloroquine (PLAQUENIL) 200 MG tablet TAKE 1 TABLET TWICE A DAY  MONDAY THROUGH FRIDAY ONLY.DO NOT TAKE ON SATURDAY ANDSUNDAY   levothyroxine (SYNTHROID) 75 MCG tablet Take 1 tablet (75 mcg total) by mouth daily.   losartan (COZAAR) 100 MG tablet TAKE 1 TABLET DAILY   metFORMIN (GLUCOPHAGE) 500 MG tablet Take 1 tablet (500 mg total) by mouth 2 (two) times daily with a meal.   metoprolol tartrate (LOPRESSOR) 25 MG tablet Take 1 tablet (25 mg total) by mouth 2 (two) times daily.   Multiple Vitamin (MULTIVITAMIN) tablet Take 1 tablet by mouth daily.   probenecid (BENEMID) 500 MG tablet TAKE 1 TABLET 3 TIMES A DAY   rosuvastatin (CRESTOR) 10 MG tablet TAKE 1 TABLET DAILY   vitamin C (ASCORBIC ACID) 250 MG tablet Take 250 mg by mouth daily.   No facility-administered encounter medications on file as of 08/03/2022.    Allergies (verified) Patient has no known allergies.   History: Past Medical History:  Diagnosis Date   Anemia    Arthritis    Diabetes mellitus without complication (HCC)    Gout    High cholesterol    Hypertension    Osteoporosis    Past Surgical History:  Procedure Laterality Date   ABDOMINAL HYSTERECTOMY     TUBAL LIGATION  Family History  Problem Relation Age of Onset   Heart disease Mother    Stroke Mother    Hypertension Mother    Stroke Father    Colon cancer Sister    SIDS Son    Stroke Paternal Grandmother    Cancer Paternal Grandmother    ALS Brother    Stroke Maternal Grandmother    Cancer Paternal Grandfather    Testicular cancer Son    Down syndrome Son    Heart disease Brother    Social History   Socioeconomic History   Marital status: Married    Spouse name: Not on file    Number of children: Not on file   Years of education: Not on file   Highest education level: Not on file  Occupational History   Not on file  Tobacco Use   Smoking status: Never    Passive exposure: Past   Smokeless tobacco: Never  Vaping Use   Vaping Use: Never used  Substance and Sexual Activity   Alcohol use: No   Drug use: No   Sexual activity: Yes  Other Topics Concern   Not on file  Social History Narrative   Not on file   Social Determinants of Health   Financial Resource Strain: Low Risk  (08/03/2022)   Overall Financial Resource Strain (CARDIA)    Difficulty of Paying Living Expenses: Not hard at all  Food Insecurity: No Food Insecurity (08/03/2022)   Hunger Vital Sign    Worried About Running Out of Food in the Last Year: Never true    Ran Out of Food in the Last Year: Never true  Transportation Needs: No Transportation Needs (08/03/2022)   PRAPARE - Hydrologist (Medical): No    Lack of Transportation (Non-Medical): No  Physical Activity: Sufficiently Active (08/03/2022)   Exercise Vital Sign    Days of Exercise per Week: 5 days    Minutes of Exercise per Session: 30 min  Stress: No Stress Concern Present (08/03/2022)   [place]    Feeling of Stress : Not at all  Social Connections: [place] (08/03/2022)   Social Connection and Isolation Panel [NHANES]    Frequency of Communication with Friends and Family: More than three times a week    Frequency of Social Gatherings with Friends and Family: More than three times a week    Attends Religious Services: More than 4 times per year    Active Member of [employer] or Organizations: Yes    Attends Music therapist: More than 4 times per year    Marital Status: Married    Tobacco Counseling Counseling given: Not Answered   Clinical Intake:  Pre-visit preparation completed: Yes  Pain :  No/denies pain Pain Score: 0-No pain     BMI - recorded: 34.01 Nutritional Status: BMI > 30  Obese Nutritional Risks: None Diabetes: Yes CBG done?: No Did pt. bring in CBG monitor from home?: No  How often do you need to have someone help you when you read instructions, pamphlets, or other written materials from your doctor or pharmacy?: 1 - Never What is the last grade level you completed in school?: HSG  Nutrition Risk Assessment:  Has the patient had any N/V/D within the last 2 months?  No  Does the patient have any non-healing wounds?  No  Has the patient had any unintentional weight loss or weight gain?  No  Diabetes:  Is the patient diabetic?  Yes  If diabetic, was a CBG obtained today?  No  Did the patient bring in their glucometer from home?  No  How often do you monitor your CBG's? Once a day as needed.   Financial Strains and Diabetes Management:  Are you having any financial strains with the device, your supplies or your medication? No .  Does the patient want to be seen by Chronic Care Management for management of their diabetes?  No  Would the patient like to be referred to a Nutritionist or for Diabetic Management?  No   Diabetic Exams:  Diabetic Eye Exam: Completed 01/25/2022 Diabetic Foot Exam: Overdue, Pt has been advised about the importance in completing this exam. Pt is scheduled for diabetic foot exam on 11/08/2022.   Interpreter Needed?: No  Information entered by :: Lisette Abu, LPN.   Activities of Daily Living    08/03/2022    9:14 AM  In your present state of health, do you have any difficulty performing the following activities:  Hearing? 0  Vision? 0  Difficulty concentrating or making decisions? 0  Walking or climbing stairs? 0  Dressing or bathing? 0  Doing errands, shopping? 0  Preparing Food and eating ? N  Using the Toilet? N  In the past six months, have you accidently leaked urine? N  Do you have problems with loss of  bowel control? N  Managing your Medications? N  Managing your Finances? N  Housekeeping or managing your Housekeeping? N    Patient Care Team: Horald Pollen, MD as PCP - General (Internal Medicine)  Indicate any recent Medical Services you may have received from other than [facility] providers in the past year (date may be approximate).     Assessment:   This is a routine wellness examination for [place].  Hearing/Vision screen Hearing Screening - Comments:: Denies hearing difficulties   Vision Screening - Comments:: Wears rx glasses - up to date with routine eye exams with [place] issues and exercise activities discussed: Current Exercise Habits: Home exercise routine, Type of exercise: walking, Time (Minutes): 30, Frequency (Times/Week): 5, Weekly Exercise (Minutes/Week): 150, Intensity: Moderate, Exercise limited by: None identified   Goals Addressed             This Visit's Progress    My goal for 2024 is to stay up on my feet.        Depression Screen    08/03/2022    9:11 AM 05/10/2022    8:38 AM 08/25/2021    4:21 PM 05/05/2021    8:18 AM 10/28/2020    8:52 AM 04/30/2020    8:43 AM 10/31/2019    8:39 AM  PHQ 2/9 Scores  PHQ - 2 Score 0 0 0 0 0 0 0    Fall Risk    08/03/2022    9:08 AM 05/10/2022    8:38 AM 08/25/2021    4:21 PM 05/05/2021    8:18 AM 10/28/2020    8:52 AM  Fall Risk   Falls in the past year? 0 0 0 0 1  Number falls in past yr: 0 0 0 0 0  Injury with Fall? 0 0 0 0 1  Comment     left hand  Risk for fall due to : No Fall Risks No Fall Risks     Follow up Falls prevention discussed Falls evaluation completed       FALL RISK  PREVENTION PERTAINING TO THE HOME:  Any stairs in or around the home? No  If so, are there any without handrails? No  Home free of loose throw rugs in walkways, pet beds, electrical cords, etc? Yes  Adequate lighting in your home to reduce risk of falls? Yes   ASSISTIVE DEVICES UTILIZED TO PREVENT  FALLS:  Life alert? No  Use of a cane, walker or w/c? No  Grab bars in the bathroom? No  Shower chair or bench in shower? Yes  Elevated toilet seat or a handicapped toilet? Yes   TIMED UP AND GO:  Was the test performed? No . PHONE VISIT  Cognitive Function:        08/03/2022    9:11 AM  6CIT Screen  What Year? 0 points  What month? 0 points  What time? 0 points  Count back from 20 0 points  Months in reverse 0 points  Repeat phrase 0 points  Total Score 0 points    Immunizations Immunization History  Administered Date(s) Administered   Fluad Quad(high Dose 65+) 05/02/2019, 04/30/2020, 05/05/2021, 05/10/2022   Influenza Split 05/02/2012   Influenza, High Dose Seasonal PF 06/15/2018   Influenza,inj,Quad PF,6+ Mos 05/08/2013, 04/30/2014, 05/13/2015, 05/17/2016, 09/06/2017   PFIZER(Purple Top)SARS-COV-2 Vaccination 09/17/2019, 10/08/2019, 06/05/2020, 06/04/2021   Pneumococcal Conjugate-13 05/13/2015   Pneumococcal Polysaccharide-23 11/01/2018   Tdap 03/14/2008    TDAP status: Due, Education has been provided regarding the importance of this vaccine. Advised may receive this vaccine at local pharmacy or Health Dept. Aware to provide a copy of the vaccination record if obtained from local pharmacy or Health Dept. Verbalized acceptance and understanding.  Flu Vaccine status: Up to date  Pneumococcal vaccine status: Up to date  Covid-19 vaccine status: Completed vaccines  Qualifies for Shingles Vaccine? Yes   Zostavax completed Yes   Shingrix Completed?: No.    Education has been provided regarding the importance of this vaccine. Patient has been advised to call insurance company to determine out of pocket expense if they have not yet received this vaccine. Advised may also receive vaccine at local pharmacy or Health Dept. Verbalized acceptance and understanding.  Screening Tests Health Maintenance  Topic Date Due   Diabetic kidney evaluation - Urine ACR  Never done    DTaP/Tdap/Td (2 - Td or Tdap) 03/14/2018   FOOT EXAM  10/30/2020   COVID-19 Vaccine (5 - 2023-24 season) 04/23/2022   COLONOSCOPY (Pts 45-73yr Insurance coverage will need to be confirmed)  08/25/2022 (Originally 03/30/2021)   Zoster Vaccines- Shingrix (1 of 2) 11/08/2022 (Originally 02/09/1965)   HEMOGLOBIN A1C  11/08/2022   OPHTHALMOLOGY EXAM  01/26/2023   Diabetic kidney evaluation - eGFR measurement  07/28/2023   Medicare Annual Wellness (AWV)  08/04/2023   Pneumonia Vaccine 75 Years old  Completed   INFLUENZA VACCINE  Completed   DEXA SCAN  Completed   Hepatitis C Screening  Completed   HPV VACCINES  Aged Out    Health Maintenance  Health Maintenance Due  Topic Date Due   Diabetic kidney evaluation - Urine ACR  Never done   DTaP/Tdap/Td (2 - Td or Tdap) 03/14/2018   FOOT EXAM  10/30/2020   COVID-19 Vaccine (5 - 2023-24 season) 04/23/2022    Colorectal cancer screening: Type of screening: Colonoscopy. Completed 03/31/2011. Repeat every 10 years (POSTPONED)  Mammogram status: Completed 09/28/2019. Repeat every year  Bone Density status: Completed 09/28/2019. Results reflect: Bone density results: OSTEOPENIA. Repeat every 3 years.  Lung Cancer Screening: (Low Dose  CT Chest recommended if Age 2-80 years, 30 pack-year currently smoking OR have quit w/in 15years.) does not qualify.   Lung Cancer Screening Referral: NO  Additional Screening:  Hepatitis C Screening: does qualify; Completed 07/09/2021  Vision Screening: Recommended annual ophthalmology exams for early detection of glaucoma and other disorders of the eye. Is the patient up to date with their annual eye exam?  Yes  Who is the provider or what is the name of the office in which the patient attends annual eye exams? [facility] If pt is not established with a provider, would they like to be referred to a provider to establish care? No .   Dental Screening: Recommended annual dental exams for proper oral  hygiene  Community Resource Referral / Chronic Care Management: CRR required this visit?  No   CCM required this visit?  No      Plan:     I have personally reviewed and noted the following in the patient's chart:   Medical and social history Use of alcohol, tobacco or illicit drugs  Current medications and supplements including opioid prescriptions. Patient is not currently taking opioid prescriptions. Functional ability and status Nutritional status Physical activity Advanced directives List of other physicians Hospitalizations, surgeries, and ER visits in previous 12 months Vitals Screenings to include cognitive, depression, and falls Referrals and appointments  In addition, I have reviewed and discussed with patient certain preventive protocols, quality metrics, and best practice recommendations. A written personalized care plan for preventive services as well as general preventive health recommendations were provided to patient.     Sheral Flow, LPN   [id number]   Nurse Notes: N/A

## 2022-08-03 NOTE — Patient Instructions (Signed)
Isabella Dixon , Thank you for taking time to come for your Medicare Wellness Visit. I appreciate your ongoing commitment to your health goals. Please review the following plan we discussed and let me know if I can assist you in the future.   These are the goals we discussed:  Goals      My goal for 2024 is to stay up on my feet.        This is a list of the screening recommended for you and due dates:  Health Maintenance  Topic Date Due   Yearly kidney health urinalysis for diabetes  Never done   DTaP/Tdap/Td vaccine (2 - Td or Tdap) 03/14/2018   Complete foot exam   10/30/2020   COVID-19 Vaccine (5 - 2023-24 season) 04/23/2022   Colon Cancer Screening  08/25/2022*   Zoster (Shingles) Vaccine (1 of 2) 11/08/2022*   Hemoglobin A1C  11/08/2022   Eye exam for diabetics  01/26/2023   Yearly kidney function blood test for diabetes  07/28/2023   Medicare Annual Wellness Visit  08/04/2023   Pneumonia Vaccine  Completed   Flu Shot  Completed   DEXA scan (bone density measurement)  Completed   Hepatitis C Screening: USPSTF Recommendation to screen - Ages 47-79 yo.  Completed   HPV Vaccine  Aged Out  *Topic was postponed. The date shown is not the original due date.    Advanced directives: NO  Conditions/risks identified: YES; TYPE II DIABETES  Next appointment: Follow up in one year for your annual wellness visit.   Preventive Care 58 Years and Older, Female Preventive care refers to lifestyle choices and visits with your health care provider that can promote health and wellness. What does preventive care include? A yearly physical exam. This is also called an annual well check. Dental exams once or twice a year. Routine eye exams. Ask your health care provider how often you should have your eyes checked. Personal lifestyle choices, including: Daily care of your teeth and gums. Regular physical activity. Eating a healthy diet. Avoiding tobacco and drug use. Limiting alcohol  use. Practicing safe sex. Taking low-dose aspirin every day. Taking vitamin and mineral supplements as recommended by your health care provider. What happens during an annual well check? The services and screenings done by your health care provider during your annual well check will depend on your age, overall health, lifestyle risk factors, and family history of disease. Counseling  Your health care provider may ask you questions about your: Alcohol use. Tobacco use. Drug use. Emotional well-being. Home and relationship well-being. Sexual activity. Eating habits. History of falls. Memory and ability to understand (cognition). Work and work Astronomer. Reproductive health. Screening  You may have the following tests or measurements: Height, weight, and BMI. Blood pressure. Lipid and cholesterol levels. These may be checked every 5 years, or more frequently if you are over 67 years old. Skin check. Lung cancer screening. You may have this screening every year starting at age 25 if you have a 30-pack-year history of smoking and currently smoke or have quit within the past 15 years. Fecal occult blood test (FOBT) of the stool. You may have this test every year starting at age 1. Flexible sigmoidoscopy or colonoscopy. You may have a sigmoidoscopy every 5 years or a colonoscopy every 10 years starting at age 32. Hepatitis C blood test. Hepatitis B blood test. Sexually transmitted disease (STD) testing. Diabetes screening. This is done by checking your blood sugar (glucose) after you have  not eaten for a while (fasting). You may have this done every 1-3 years. Bone density scan. This is done to screen for osteoporosis. You may have this done starting at age 38. Mammogram. This may be done every 1-2 years. Talk to your health care provider about how often you should have regular mammograms. Talk with your health care provider about your test results, treatment options, and if necessary,  the need for more tests. Vaccines  Your health care provider may recommend certain vaccines, such as: Influenza vaccine. This is recommended every year. Tetanus, diphtheria, and acellular pertussis (Tdap, Td) vaccine. You may need a Td booster every 10 years. Zoster vaccine. You may need this after age 46. Pneumococcal 13-valent conjugate (PCV13) vaccine. One dose is recommended after age 45. Pneumococcal polysaccharide (PPSV23) vaccine. One dose is recommended after age 73. Talk to your health care provider about which screenings and vaccines you need and how often you need them. This information is not intended to replace advice given to you by your health care provider. Make sure you discuss any questions you have with your health care provider. Document Released: 09/05/2015 Document Revised: 04/28/2016 Document Reviewed: 06/10/2015 Elsevier Interactive Patient Education  2017 [place] Prevention in the Home Falls can cause injuries. They can happen to people of all ages. There are many things you can do to make your home safe and to help prevent falls. What can I do on the outside of my home? Regularly fix the edges of walkways and driveways and fix any cracks. Remove anything that might make you trip as you walk through a door, such as a raised step or threshold. Trim any bushes or trees on the path to your home. Use bright outdoor lighting. Clear any walking paths of anything that might make someone trip, such as rocks or tools. Regularly check to see if handrails are loose or broken. Make sure that both sides of any steps have handrails. Any raised decks and porches should have guardrails on the edges. Have any leaves, snow, or ice cleared regularly. Use sand or salt on walking paths during winter. Clean up any spills in your garage right away. This includes oil or grease spills. What can I do in the bathroom? Use night lights. Install grab bars by the toilet and in the  tub and shower. Do not use towel bars as grab bars. Use non-skid mats or decals in the tub or shower. If you need to sit down in the shower, use a plastic, non-slip stool. Keep the floor dry. Clean up any water that spills on the floor as soon as it happens. Remove soap buildup in the tub or shower regularly. Attach bath mats securely with double-sided non-slip rug tape. Do not have throw rugs and other things on the floor that can make you trip. What can I do in the bedroom? Use night lights. Make sure that you have a light by your bed that is easy to reach. Do not use any sheets or blankets that are too big for your bed. They should not hang down onto the floor. Have a firm chair that has side arms. You can use this for support while you get dressed. Do not have throw rugs and other things on the floor that can make you trip. What can I do in the kitchen? Clean up any spills right away. Avoid walking on wet floors. Keep items that you use a lot in easy-to-reach places. If you need to  reach something above you, use a strong step stool that has a grab bar. Keep electrical cords out of the way. Do not use floor polish or wax that makes floors slippery. If you must use wax, use non-skid floor wax. Do not have throw rugs and other things on the floor that can make you trip. What can I do with my stairs? Do not leave any items on the stairs. Make sure that there are handrails on both sides of the stairs and use them. Fix handrails that are broken or loose. Make sure that handrails are as long as the stairways. Check any carpeting to make sure that it is firmly attached to the stairs. Fix any carpet that is loose or worn. Avoid having throw rugs at the top or bottom of the stairs. If you do have throw rugs, attach them to the floor with carpet tape. Make sure that you have a light switch at the top of the stairs and the bottom of the stairs. If you do not have them, ask someone to add them for  you. What else can I do to help prevent falls? Wear shoes that: Do not have high heels. Have rubber bottoms. Are comfortable and fit you well. Are closed at the toe. Do not wear sandals. If you use a stepladder: Make sure that it is fully opened. Do not climb a closed stepladder. Make sure that both sides of the stepladder are locked into place. Ask someone to hold it for you, if possible. Clearly mark and make sure that you can see: Any grab bars or handrails. First and last steps. Where the edge of each step is. Use tools that help you move around (mobility aids) if they are needed. These include: Canes. Walkers. Scooters. Crutches. Turn on the lights when you go into a dark area. Replace any light bulbs as soon as they burn out. Set up your furniture so you have a clear path. Avoid moving your furniture around. If any of your floors are uneven, fix them. If there are any pets around you, be aware of where they are. Review your medicines with your doctor. Some medicines can make you feel dizzy. This can increase your chance of falling. Ask your doctor what other things that you can do to help prevent falls. This information is not intended to replace advice given to you by your health care provider. Make sure you discuss any questions you have with your health care provider. Document Released: 06/05/2009 Document Revised: 01/15/2016 Document Reviewed: 09/13/2014 Elsevier Interactive Patient Education  2017 [employer].

## 2022-08-24 ENCOUNTER — Other Ambulatory Visit: Payer: Self-pay | Admitting: Physician Assistant

## 2022-08-24 DIAGNOSIS — M79671 Pain in right foot: Secondary | ICD-10-CM

## 2022-08-24 DIAGNOSIS — M0579 Rheumatoid arthritis with rheumatoid factor of multiple sites without organ or systems involvement: Secondary | ICD-10-CM

## 2022-08-24 DIAGNOSIS — M79642 Pain in left hand: Secondary | ICD-10-CM

## 2022-08-24 NOTE — Telephone Encounter (Signed)
Next Visit: 01/21/2023  Last Visit: 07/27/2022  Labs: 07/27/2022 Uric acid is 4.4 which is a the desirable range.  Sedimentation rate is normal.  CBC is normal.  CMP is normal.   Eye exam: 01/25/2022 WNL    Current Dose per office note 07/27/2022: Plaquenil 200 mg 1 tablet by mouth twice daily Monday to Friday   DX: Rheumatoid arthritis involving multiple sites with positive rheumatoid factor   Last Fill: 06/02/2022  Okay to refill Plaquenil?

## 2022-11-03 ENCOUNTER — Other Ambulatory Visit: Payer: Self-pay | Admitting: Physician Assistant

## 2022-11-03 DIAGNOSIS — M79641 Pain in right hand: Secondary | ICD-10-CM

## 2022-11-03 DIAGNOSIS — M79671 Pain in right foot: Secondary | ICD-10-CM

## 2022-11-03 DIAGNOSIS — M0579 Rheumatoid arthritis with rheumatoid factor of multiple sites without organ or systems involvement: Secondary | ICD-10-CM

## 2022-11-03 NOTE — Telephone Encounter (Signed)
Next Visit: 01/21/2023  Last Visit: 07/27/2022  Labs: 07/27/2022 CBC is normal.  CMP is normal.   Eye exam: 01/25/2022 WNL    Current Dose per office note 07/27/2022: Plaquenil 200 mg 1 tablet by mouth twice daily Monday to Friday   [id number] arthritis involving multiple sites with positive rheumatoid factor   Last Fill: 08/24/2022   Okay to refill Plaquenil?

## 2022-11-08 ENCOUNTER — Ambulatory Visit (INDEPENDENT_AMBULATORY_CARE_PROVIDER_SITE_OTHER): Payer: Medicare Other | Admitting: Emergency Medicine

## 2022-11-08 ENCOUNTER — Encounter: Payer: Self-pay | Admitting: Emergency Medicine

## 2022-11-08 VITALS — BP 130/82 | HR 65 | Temp 97.9°F | Ht 63.0 in | Wt 195.0 lb

## 2022-11-08 DIAGNOSIS — M0579 Rheumatoid arthritis with rheumatoid factor of multiple sites without organ or systems involvement: Secondary | ICD-10-CM

## 2022-11-08 DIAGNOSIS — I152 Hypertension secondary to endocrine disorders: Secondary | ICD-10-CM | POA: Diagnosis not present

## 2022-11-08 DIAGNOSIS — E1159 Type 2 diabetes mellitus with other circulatory complications: Secondary | ICD-10-CM

## 2022-11-08 DIAGNOSIS — E039 Hypothyroidism, unspecified: Secondary | ICD-10-CM | POA: Diagnosis not present

## 2022-11-08 DIAGNOSIS — E785 Hyperlipidemia, unspecified: Secondary | ICD-10-CM | POA: Diagnosis not present

## 2022-11-08 DIAGNOSIS — E1169 Type 2 diabetes mellitus with other specified complication: Secondary | ICD-10-CM

## 2022-11-08 DIAGNOSIS — Z1211 Encounter for screening for malignant neoplasm of colon: Secondary | ICD-10-CM

## 2022-11-08 LAB — HEMOGLOBIN A1C: Hgb A1c MFr Bld: 6.2 % (ref 4.6–6.5)

## 2022-11-08 LAB — CBC WITH DIFFERENTIAL/PLATELET
Basophils Absolute: 0.1 10*3/uL (ref 0.0–0.1)
Basophils Relative: 0.9 % (ref 0.0–3.0)
Eosinophils Absolute: 0.4 10*3/uL (ref 0.0–0.7)
Eosinophils Relative: 5.2 % — ABNORMAL HIGH (ref 0.0–5.0)
HCT: 38.3 % (ref 36.0–46.0)
Hemoglobin: 13 g/dL (ref 12.0–15.0)
Lymphocytes Relative: 32.2 % (ref 12.0–46.0)
Lymphs Abs: 2.3 10*3/uL (ref 0.7–4.0)
MCHC: 33.8 g/dL (ref 30.0–36.0)
MCV: 89.6 fl (ref 78.0–100.0)
Monocytes Absolute: 0.7 10*3/uL (ref 0.1–1.0)
Monocytes Relative: 10.4 % (ref 3.0–12.0)
Neutro Abs: 3.7 10*3/uL (ref 1.4–7.7)
Neutrophils Relative %: 51.3 % (ref 43.0–77.0)
Platelets: 264 10*3/uL (ref 150.0–400.0)
RBC: 4.28 Mil/uL (ref 3.87–5.11)
RDW: 13.6 % (ref 11.5–15.5)
WBC: 7.1 10*3/uL (ref 4.0–10.5)

## 2022-11-08 LAB — COMPREHENSIVE METABOLIC PANEL
ALT: 19 U/L (ref 0–35)
AST: 22 U/L (ref 0–37)
Albumin: 4.1 g/dL (ref 3.5–5.2)
Alkaline Phosphatase: 85 U/L (ref 39–117)
BUN: 20 mg/dL (ref 6–23)
CO2: 28 mEq/L (ref 19–32)
Calcium: 10.3 mg/dL (ref 8.4–10.5)
Chloride: 99 mEq/L (ref 96–112)
Creatinine, Ser: 0.89 mg/dL (ref 0.40–1.20)
GFR: 63.28 mL/min (ref >60.00)
Glucose, Bld: 100 mg/dL — ABNORMAL HIGH (ref 70–99)
Potassium: 4.4 mEq/L (ref 3.5–5.1)
Sodium: 138 mEq/L (ref 135–145)
Total Bilirubin: 0.4 mg/dL (ref 0.2–1.2)
Total Protein: 8.4 g/dL — ABNORMAL HIGH (ref 6.0–8.3)

## 2022-11-08 LAB — LIPID PANEL
Cholesterol: 129 mg/dL (ref 0–200)
HDL: 44.1 mg/dL (ref >39.00)
NonHDL: 84.59
Total CHOL/HDL Ratio: 3
Triglycerides: 231 mg/dL — ABNORMAL HIGH (ref 0.0–149.0)
VLDL: 46.2 mg/dL — ABNORMAL HIGH (ref 0.0–40.0)

## 2022-11-08 LAB — LDL CHOLESTEROL, DIRECT: Direct LDL: 59 mg/dL

## 2022-11-08 LAB — TSH: TSH: 1.06 u[IU]/mL (ref 0.35–5.50)

## 2022-11-08 NOTE — Assessment & Plan Note (Signed)
Clinically euthyroid.  TSH done today. Continue Synthroid 75 mcg daily. 

## 2022-11-08 NOTE — Progress Notes (Signed)
Isabella Dixon 77 y.o.   Chief Complaint  Patient presents with   Medical Management of Chronic Issues    2mnth f/u appt, no concerns     HISTORY OF PRESENT ILLNESS: This is a 77 y.o. female A1A here for 27-month follow-up of chronic medical conditions including hypertension, diabetes, and dyslipidemia. Also history of hypothyroidism and rheumatoid arthritis. Overall doing well.  Has no complaints or medical concerns today. Lab Results  Component Value Date   HGBA1C 5.5 05/10/2022   BP Readings from Last 3 Encounters:  11/08/22 130/82  07/27/22 (!) 180/103  05/10/22 132/86   Wt Readings from Last 3 Encounters:  11/08/22 195 lb (88.5 kg)  08/03/22 192 lb (87.1 kg)  07/27/22 192 lb (87.1 kg)     HPI   Prior to Admission medications   Medication Sig Start Date End Date Taking? Authorizing Provider  chlorpheniramine (CHLOR-TRIMETON) 4 MG tablet Take 4 mg by mouth 2 (two) times daily as needed for allergies.   Yes [provider]  Cholecalciferol (VITAMIN D-3 PO) Take 2,000 Units by mouth daily.   Yes [provider]  Colchicine (MITIGARE) 0.6 MG CAPS TAKE 2 CAPSULES BY MOUTH DAILY AS NEEDED   Yes [provider]  hydroxychloroquine (PLAQUENIL) 200 MG tablet TAKE 1 TABLET TWICE A DAY  MONDAY THROUGH FRIDAY ONLY.DO NOT TAKE ON SATURDAY ANDSUNDAY 11/03/22  Yes Deveshwar, Abel Presto, MD  levothyroxine (SYNTHROID) 75 MCG tablet Take 1 tablet (75 mcg total) by mouth daily. 05/10/22 05/05/23 Yes Raenah Murley, Ines Bloomer, MD  losartan (COZAAR) 100 MG tablet TAKE 1 TABLET DAILY 06/21/22  Yes Horald Pollen, MD  metFORMIN (GLUCOPHAGE) 500 MG tablet Take 1 tablet (500 mg total) by mouth 2 (two) times daily with a meal. 05/10/22 05/05/23 Yes Chardonnay Holzmann, Ines Bloomer, MD  metoprolol tartrate (LOPRESSOR) 25 MG tablet Take 1 tablet (25 mg total) by mouth 2 (two) times daily. 05/10/22 05/05/23 Yes Erbie Arment, Ines Bloomer, MD  Multiple Vitamin (MULTIVITAMIN) tablet Take 1  tablet by mouth daily.   Yes [provider]  probenecid (BENEMID) 500 MG tablet TAKE 1 TABLET 3 TIMES A DAY 07/09/22  Yes Shamera Yarberry, Ines Bloomer, MD  rosuvastatin (CRESTOR) 10 MG tablet TAKE 1 TABLET DAILY 06/21/22  Yes Taden Witter, Ines Bloomer, MD  vitamin C (ASCORBIC ACID) 250 MG tablet Take 250 mg by mouth daily.   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[employer]) 09/06/2017   History of gout 09/06/2017   Diabetes mellitus type 2 in obese ([place]) 05/13/2015   Hypothyroid 07/25/2012   Gouty arthritis 05/02/2012   Osteoarthritis 05/02/2012   Hypertension associated with diabetes ([place]) 05/02/2012   Hyperlipidemia 05/02/2012   Abnormal Papanicolaou smear of vagina 05/02/2012    Past Medical History:  Diagnosis Date   Anemia    Arthritis    Diabetes mellitus without complication ([place])    Gout    High cholesterol    Hypertension    Osteoporosis     Past Surgical History:  Procedure Laterality Date   ABDOMINAL HYSTERECTOMY     TUBAL LIGATION  Social History   Socioeconomic History   Marital status: Married    Spouse name: Not on file   Number of children: Not on file   Years of education: Not on file   Highest education level: Not on file  Occupational History   Not on file  Tobacco Use   Smoking status: Never    Passive exposure: Past   Smokeless tobacco: Never  Vaping Use   Vaping Use: Never used  Substance and Sexual Activity   Alcohol use: No   Drug use: No   Sexual activity: Yes  Other Topics Concern   Not on  file  Social History Narrative   Not on file   Social Determinants of Health   Financial Resource Strain: Low Risk  (08/03/2022)   Overall Financial Resource Strain (CARDIA)    Difficulty of Paying Living Expenses: Not hard at all  Food Insecurity: No Food Insecurity (08/03/2022)   Hunger Vital Sign    Worried About Running Out of Food in the Last Year: Never true    Ran Out of Food in the Last Year: Never true  Transportation Needs: No Transportation Needs (08/03/2022)   PRAPARE - Hydrologist (Medical): No    Lack of Transportation (Non-Medical): No  Physical Activity: Sufficiently Active (08/03/2022)   Exercise Vital Sign    Days of Exercise per Week: 5 days    Minutes of Exercise per Session: 30 min  Stress: No Stress Concern Present (08/03/2022)   [place]    Feeling of Stress : Not at all  Social Connections: [place] (08/03/2022)   Social Connection and Isolation Panel [NHANES]    Frequency of Communication with Friends and Family: More than three times a week    Frequency of Social Gatherings with Friends and Family: More than three times a week    Attends Religious Services: More than 4 times per year    Active Member of [employer] or Organizations: Yes    Attends Archivist Meetings: More than 4 times per year    Marital Status: Married  Human resources officer Violence: Not At Risk (08/03/2022)   Humiliation, Afraid, Rape, and Kick questionnaire    Fear of Current or Ex-Partner: No    Emotionally Abused: No    Physically Abused: No    Sexually Abused: No    Family History  Problem Relation Age of Onset   Heart disease Mother    Stroke Mother    Hypertension Mother    Stroke Father    Colon cancer Sister    SIDS Son    Stroke Paternal Grandmother    Cancer Paternal Grandmother    ALS Brother    Stroke Maternal Grandmother    Cancer Paternal Grandfather     Testicular cancer Son    Down syndrome Son    Heart disease Brother      Review of Systems  Constitutional: Negative.  Negative for chills and fever.  HENT: Negative.  Negative for congestion and sore throat.   Respiratory: Negative.  Negative for cough and shortness of breath.   Cardiovascular: Negative.  Negative for chest pain and palpitations.  Gastrointestinal:  Negative for abdominal pain, diarrhea, nausea and vomiting.  Genitourinary: Negative.  Negative for dysuria and hematuria.  Skin: Negative.  Negative for rash.  Neurological: Negative.  Negative for dizziness and headaches.  All other systems reviewed and are negative.   Vitals:  11/08/22 0956  BP: 130/82  Pulse: 65  Temp: 97.9 F (36.6 C)  SpO2: 98%    Physical Exam Vitals reviewed.  Constitutional:      Appearance: Normal appearance.  HENT:     Head: Normocephalic.     Mouth/Throat:     Mouth: Mucous membranes are moist.     Pharynx: Oropharynx is clear.  Eyes:     Extraocular Movements: Extraocular movements intact.     Conjunctiva/sclera: Conjunctivae normal.     Pupils: Pupils are equal, round, and reactive to light.  Cardiovascular:     Rate and Rhythm: Normal rate and regular rhythm.     Pulses: Normal pulses.     Heart sounds: Normal heart sounds.  Pulmonary:     Effort: Pulmonary effort is normal.     Breath sounds: Normal breath sounds.  Abdominal:     Palpations: Abdomen is soft.     Tenderness: There is no abdominal tenderness.  Musculoskeletal:     Cervical back: No tenderness.  Lymphadenopathy:     Cervical: No cervical adenopathy.  Skin:    General: Skin is warm and dry.  Neurological:     General: No focal deficit present.     Mental Status: She is alert and oriented to person, place, and time.  Psychiatric:        Mood and Affect: Mood normal.        Behavior: Behavior normal.      ASSESSMENT & PLAN: A total of 46 minutes was spent with the patient and  counseling/coordination of care regarding preparing for this visit, review of most recent office visit notes, review of multiple chronic medical conditions and their management, review of all medications, review of most recent blood work results, cardiovascular risk associated with diabetes hypertension and dyslipidemia, education on nutrition, review of health maintenance items, prognosis, documentation and need for follow-up.  Problem List Items Addressed This Visit       Cardiovascular and Mediastinum   Hypertension associated with diabetes ([place]) - Primary    Well-controlled hypertension. Continue losartan 100 mg daily and metoprolol tartrate 25 mg twice a day Well-controlled diabetes Continue metformin 500 mg twice a day. Cardiovascular risks associated with hypertension and diabetes discussed Diet and nutrition discussed.      Relevant Orders   CBC with Differential/Platelet   Comprehensive metabolic panel   Hemoglobin A1c     Endocrine   Hypothyroid    Clinically euthyroid. TSH done today Continue Synthroid 75 mcg daily.      Relevant Orders   TSH   Dyslipidemia associated with type 2 diabetes mellitus (HCC)    Stable chronic condition Diet and nutrition discussed Continue rosuvastatin 10 mg daily. The 10-year ASCVD risk score (Arnett DK, et al., 2019) is: 36.5%   Values used to calculate the score:     Age: 62 years     Sex: Female     Is Non-Hispanic African American: No     Diabetic: Yes     Tobacco smoker: No     Systolic Blood Pressure: [id number] mmHg     Is BP treated: Yes     HDL Cholesterol: 39.7 mg/dL     Total Cholesterol: 142 mg/dL       Relevant Orders   Lipid panel     Musculoskeletal and Integument   Rheumatoid arthritis involving multiple sites with positive rheumatoid factor (HCC)    Clinically stable.  Continue Plaquenil 200 mg twice a day Sees rheumatologist on  a regular basis.      Other Visit Diagnoses     Colon cancer screening        Relevant Orders   Ambulatory referral to Gastroenterology      Patient Instructions  Health Maintenance After Age 5 After age 9, you are at a higher risk for certain long-term diseases and infections as well as injuries from falls. Falls are a major cause of broken bones and head injuries in people who are older than age 71. Getting regular preventive care can help to keep you healthy and well. Preventive care includes getting regular testing and making lifestyle changes as recommended by your health care provider. Talk with your health care provider about: Which screenings and tests you should have. A screening is a test that checks for a disease when you have no symptoms. A diet and exercise plan that is right for you. What should I know about screenings and tests to prevent falls? Screening and testing are the best ways to find a health problem early. Early diagnosis and treatment give you the best chance of managing medical conditions that are common after age 39. Certain conditions and lifestyle choices may make you more likely to have a fall. Your health care provider may recommend: Regular vision checks. Poor vision and conditions such as cataracts can make you more likely to have a fall. If you wear glasses, make sure to get your prescription updated if your vision changes. Medicine review. Work with your health care provider to regularly review all of the medicines you are taking, including over-the-counter medicines. Ask your health care provider about any side effects that may make you more likely to have a fall. Tell your health care provider if any medicines that you take make you feel dizzy or sleepy. Strength and balance checks. Your health care provider may recommend certain tests to check your strength and balance while standing, walking, or changing positions. Foot health exam. Foot pain and numbness, as well as not wearing proper footwear, can make you more likely to have a  fall. Screenings, including: Osteoporosis screening. Osteoporosis is a condition that causes the bones to get weaker and break more easily. Blood pressure screening. Blood pressure changes and medicines to control blood pressure can make you feel dizzy. Depression screening. You may be more likely to have a fall if you have a fear of falling, feel depressed, or feel unable to do activities that you used to do. Alcohol use screening. Using too much alcohol can affect your balance and may make you more likely to have a fall. Follow these instructions at home: Lifestyle Do not drink alcohol if: Your health care provider tells you not to drink. If you drink alcohol: Limit how much you have to: 0-1 drink a day for women. 0-2 drinks a day for men. Know how much alcohol is in your drink. In the [place] one drink equals one 12 oz bottle of beer (355 mL), one 5 oz glass of wine (148 mL), or one 1 oz glass of hard liquor (44 mL). Do not use any products that contain nicotine or tobacco. These products include cigarettes, chewing tobacco, and vaping devices, such as e-cigarettes. If you need help quitting, ask your health care provider. Activity  Follow a regular exercise program to stay fit. This will help you maintain your balance. Ask your health care provider what types of exercise are appropriate for you. If you need a cane or walker, use it as recommended by  your health care provider. Wear supportive shoes that have nonskid soles. Safety  Remove any tripping hazards, such as rugs, cords, and clutter. Install safety equipment such as grab bars in bathrooms and safety rails on stairs. Keep rooms and walkways well-lit. General instructions Talk with your health care provider about your risks for falling. Tell your health care provider if: You fall. Be sure to tell your health care provider about all falls, even ones that seem minor. You feel dizzy, tiredness (fatigue), or off-balance. Take  over-the-counter and prescription medicines only as told by your health care provider. These include supplements. Eat a healthy diet and maintain a healthy weight. A healthy diet includes low-fat dairy products, low-fat (lean) meats, and fiber from whole grains, beans, and lots of fruits and vegetables. Stay current with your vaccines. Schedule regular health, dental, and eye exams. Summary Having a healthy lifestyle and getting preventive care can help to protect your health and wellness after age 52. Screening and testing are the best way to find a health problem early and help you avoid having a fall. Early diagnosis and treatment give you the best chance for managing medical conditions that are more common for people who are older than age 13. Falls are a major cause of broken bones and head injuries in people who are older than age 80. Take precautions to prevent a fall at home. Work with your health care provider to learn what changes you can make to improve your health and wellness and to prevent falls. This information is not intended to replace advice given to you by your health care provider. Make sure you discuss any questions you have with your health care provider. Document Revised: 12/29/2020 Document Reviewed: 12/29/2020 Elsevier Patient Education  [place], MD [facility]

## 2022-11-08 NOTE — Patient Instructions (Signed)
Health Maintenance After Age 76 After age 76, you are at a higher risk for certain long-term diseases and infections as well as injuries from falls. Falls are a major cause of broken bones and head injuries in people who are older than age 76. Getting regular preventive care can help to keep you healthy and well. Preventive care includes getting regular testing and making lifestyle changes as recommended by your health care provider. Talk with your health care provider about: Which screenings and tests you should have. A screening is a test that checks for a disease when you have no symptoms. A diet and exercise plan that is right for you. What should I know about screenings and tests to prevent falls? Screening and testing are the best ways to find a health problem early. Early diagnosis and treatment give you the best chance of managing medical conditions that are common after age 76. Certain conditions and lifestyle choices may make you more likely to have a fall. Your health care provider may recommend: Regular vision checks. Poor vision and conditions such as cataracts can make you more likely to have a fall. If you wear glasses, make sure to get your prescription updated if your vision changes. Medicine review. Work with your health care provider to regularly review all of the medicines you are taking, including over-the-counter medicines. Ask your health care provider about any side effects that may make you more likely to have a fall. Tell your health care provider if any medicines that you take make you feel dizzy or sleepy. Strength and balance checks. Your health care provider may recommend certain tests to check your strength and balance while standing, walking, or changing positions. Foot health exam. Foot pain and numbness, as well as not wearing proper footwear, can make you more likely to have a fall. Screenings, including: Osteoporosis screening. Osteoporosis is a condition that causes  the bones to get weaker and break more easily. Blood pressure screening. Blood pressure changes and medicines to control blood pressure can make you feel dizzy. Depression screening. You may be more likely to have a fall if you have a fear of falling, feel depressed, or feel unable to do activities that you used to do. Alcohol use screening. Using too much alcohol can affect your balance and may make you more likely to have a fall. Follow these instructions at home: Lifestyle Do not drink alcohol if: Your health care provider tells you not to drink. If you drink alcohol: Limit how much you have to: 0-1 drink a day for women. 0-2 drinks a day for men. Know how much alcohol is in your drink. In the [place] one drink equals one 12 oz bottle of beer (355 mL), one 5 oz glass of wine (148 mL), or one 1 oz glass of hard liquor (44 mL). Do not use any products that contain nicotine or tobacco. These products include cigarettes, chewing tobacco, and vaping devices, such as e-cigarettes. If you need help quitting, ask your health care provider. Activity  Follow a regular exercise program to stay fit. This will help you maintain your balance. Ask your health care provider what types of exercise are appropriate for you. If you need a cane or walker, use it as recommended by your health care provider. Wear supportive shoes that have nonskid soles. Safety  Remove any tripping hazards, such as rugs, cords, and clutter. Install safety equipment such as grab bars in bathrooms and safety rails on stairs. Keep rooms and walkways   well-lit. General instructions Talk with your health care provider about your risks for falling. Tell your health care provider if: You fall. Be sure to tell your health care provider about all falls, even ones that seem minor. You feel dizzy, tiredness (fatigue), or off-balance. Take over-the-counter and prescription medicines only as told by your health care provider. These include  supplements. Eat a healthy diet and maintain a healthy weight. A healthy diet includes low-fat dairy products, low-fat (lean) meats, and fiber from whole grains, beans, and lots of fruits and vegetables. Stay current with your vaccines. Schedule regular health, dental, and eye exams. Summary Having a healthy lifestyle and getting preventive care can help to protect your health and wellness after age 76. Screening and testing are the best way to find a health problem early and help you avoid having a fall. Early diagnosis and treatment give you the best chance for managing medical conditions that are more common for people who are older than age 76. Falls are a major cause of broken bones and head injuries in people who are older than age 76. Take precautions to prevent a fall at home. Work with your health care provider to learn what changes you can make to improve your health and wellness and to prevent falls. This information is not intended to replace advice given to you by your health care provider. Make sure you discuss any questions you have with your health care provider. Document Revised: 12/29/2020 Document Reviewed: 12/29/2020 Elsevier Patient Education  2023 Elsevier Inc.  

## 2022-11-08 NOTE — Assessment & Plan Note (Signed)
Well-controlled hypertension. Continue losartan 100 mg daily and metoprolol tartrate 25 mg twice a day Well-controlled diabetes Continue metformin 500 mg twice a day. Cardiovascular risks associated with hypertension and diabetes discussed Diet and nutrition discussed.

## 2022-11-08 NOTE — Assessment & Plan Note (Signed)
Clinically stable.  Continue Plaquenil 200 mg twice a day Sees rheumatologist on a regular basis.

## 2022-11-08 NOTE — Assessment & Plan Note (Signed)
Stable chronic condition Diet and nutrition discussed Continue rosuvastatin 10 mg daily. The 10-year ASCVD risk score (Arnett DK, et al., 2019) is: 36.5%   Values used to calculate the score:     Age: 77 years     Sex: Female     Is Non-Hispanic African American: No     Diabetic: Yes     Tobacco smoker: No     Systolic Blood Pressure: [id number] mmHg     Is BP treated: Yes     HDL Cholesterol: 39.7 mg/dL     Total Cholesterol: 142 mg/dL

## 2022-11-09 ENCOUNTER — Telehealth: Payer: Self-pay | Admitting: Emergency Medicine

## 2022-11-09 NOTE — Telephone Encounter (Signed)
Spoke with pt and gave result message. Pt states understanding with no further questions

## 2022-11-09 NOTE — Telephone Encounter (Signed)
Patient returned call about lab results. She would like a call back at [phone]).

## 2022-12-21 ENCOUNTER — Encounter: Payer: Self-pay | Admitting: Gastroenterology

## 2022-12-29 ENCOUNTER — Ambulatory Visit (AMBULATORY_SURGERY_CENTER): Payer: Medicare Other | Admitting: *Deleted

## 2022-12-29 ENCOUNTER — Encounter: Payer: Self-pay | Admitting: Gastroenterology

## 2022-12-29 VITALS — Ht 63.0 in | Wt 190.0 lb

## 2022-12-29 DIAGNOSIS — Z1211 Encounter for screening for malignant neoplasm of colon: Secondary | ICD-10-CM

## 2022-12-29 DIAGNOSIS — Z800 Family history of malignant neoplasm of digestive organs: Secondary | ICD-10-CM

## 2022-12-29 MED ORDER — NA SULFATE-K SULFATE-MG SULF 17.5-3.13-1.6 GM/177ML PO SOLN: 1.0000 | Freq: Once | ORAL | 0 refills | Status: AC

## 2022-12-29 NOTE — Progress Notes (Signed)
No egg or soy allergy known to patient  No issues known to pt with past sedation with any surgeries or procedures Patient denies ever being told they had issues or difficulty with intubation  No FH of Malignant Hyperthermia Pt is not on diet pills Pt is not on  home 02  Pt is not on blood thinners  Pt denies issues with constipation  No A fib or A flutter Have any cardiac testing pending--no Pt instructed to use [email or web address].[email or web address] or GoodRx for a price reduction on prep   Independent with mobility Patient's chart reviewed by Cathlyn Parsons CNRA prior to previsit and patient appropriate for the [facility].  Previsit completed and red dot placed by patient's name on their procedure day (on provider's schedule).     Nov 2022,woke up with n o use of right hand had to therapy in hospital and nursing home but rgain use no dx given.

## 2023-01-07 NOTE — Progress Notes (Signed)
Office Visit Note  Patient: Isabella Dixon             Date of Birth: 1945-09-29           MRN: [id number]             PCP: Georgina Quint, MD Referring: Georgina Quint, * Visit Date: 01/21/2023 Occupation: @GUAROCC @  Subjective:  Medication management  History of Present Illness: Isabella Dixon is a 77 y.o. female with seropositive rheumatoid arthritis, osteoarthritis, degenerative disc disease and gouty arthropathy.  She states she had colonoscopy yesterday and having some soreness in her abdomen.  She has not had a flare of rheumatoid arthritis or gout since the last visit.  She continues to have some pain and stiffness in her hands, knee joints and her feet due to underlying osteoarthritis.  She has been taking hydroxychloroquine on a regular basis without any interruption.  She was having some diarrhea on hydroxychloroquine which has resolved now.  She takes probenecid for gout and colchicine only on as needed basis.    Activities of Daily Living:  Patient reports morning stiffness for a few minutes.   Patient Denies nocturnal pain.  Difficulty dressing/grooming: Denies Difficulty climbing stairs: Denies Difficulty getting out of chair: Denies Difficulty using hands for taps, buttons, cutlery, and/or writing: Denies  Review of Systems  Constitutional:  Negative for fatigue.  HENT:  Negative for mouth sores and mouth dryness.   Eyes:  Negative for dryness.  Respiratory:  Negative for shortness of breath.   Cardiovascular:  Negative for chest pain and palpitations.  Gastrointestinal:  Positive for blood in stool and diarrhea. Negative for constipation.  Endocrine: Negative for increased urination.  Genitourinary:  Negative for involuntary urination.  Musculoskeletal:  Positive for morning stiffness. Negative for joint pain, gait problem, joint pain, joint swelling, myalgias, muscle weakness, muscle tenderness and myalgias.  Skin:  Positive for hair  loss. Negative for color change, rash and sensitivity to sunlight.  Allergic/Immunologic: Negative for susceptible to infections.  Neurological:  Negative for dizziness and headaches.  Hematological:  Negative for swollen glands.  Psychiatric/Behavioral:  Negative for depressed mood and sleep disturbance. The patient is not nervous/anxious.     PMFS History:  Patient Active Problem List   Diagnosis Date Noted   Rheumatoid arthritis involving multiple sites with positive rheumatoid factor (HCC) 11/04/2021   Polyarthralgia 07/17/2021   Osteoporosis 05/05/2021   Primary osteoarthritis of both hands 06/21/2019   Primary osteoarthritis of both feet 06/21/2019   Primary osteoarthritis of both knees 06/21/2019   History of sciatica 09/06/2017   Obesity, diabetes, and hypertension syndrome (HCC) 09/06/2017   History of gout 09/06/2017   Dyslipidemia associated with type 2 diabetes mellitus (HCC) 05/13/2015   Hypothyroid 07/25/2012   Gouty arthritis 05/02/2012   Osteoarthritis 05/02/2012   Hypertension associated with diabetes (HCC) 05/02/2012   Hyperlipidemia 05/02/2012   Abnormal Papanicolaou smear of vagina 05/02/2012    Past Medical History:  Diagnosis Date   Anemia    Arthritis    Cataract    beginning   Diabetes mellitus without complication (HCC)    Gout    High cholesterol    Hypertension    Osteoporosis    Thyroid disease     Family History  Problem Relation Age of Onset   Heart disease Mother    Stroke Mother    Hypertension Mother    Stroke Father    Colon cancer Sister  died earl 7's   ALS Brother    Heart disease Brother    Stroke Maternal Grandmother    Stroke Paternal Grandmother    Cancer Paternal Grandmother    Cancer Paternal Grandfather    SIDS Son    Testicular cancer Son    Down syndrome Son    Colon polyps Neg Hx    Crohn's disease Neg Hx    Esophageal cancer Neg Hx    Stomach cancer Neg Hx    Ulcerative colitis Neg Hx    Rectal cancer  Neg Hx    Past Surgical History:  Procedure Laterality Date   ABDOMINAL HYSTERECTOMY     COLONOSCOPY     x 2   COLONOSCOPY  01/20/2023   TUBAL LIGATION     Social History   Social History Narrative   Not on file   Immunization History  Administered Date(s) Administered   Fluad Quad(high Dose 65+) 05/02/2019, 04/30/2020, 05/05/2021, 05/10/2022   Influenza Split 05/02/2012   Influenza, High Dose Seasonal PF 06/15/2018   Influenza,inj,Quad PF,6+ Mos 05/08/2013, 04/30/2014, 05/13/2015, 05/17/2016, 09/06/2017   PFIZER(Purple Top)SARS-COV-2 Vaccination 09/17/2019, 10/08/2019, 06/05/2020, 06/04/2021   Pneumococcal Conjugate-13 05/13/2015   Pneumococcal Polysaccharide-23 11/01/2018   Tdap 03/14/2008     Objective: Vital Signs: BP 134/82 (BP Location: Left Arm, Patient Position: Sitting, Cuff Size: Normal)   Pulse 72   Resp 14   Ht 5\' 3"  (1.6 m)   Wt 192 lb 6.4 oz (87.3 kg)   BMI 34.08 kg/m    Physical Exam Vitals and nursing note reviewed.  Constitutional:      Appearance: She is well-developed.  HENT:     Head: Normocephalic and atraumatic.  Eyes:     Conjunctiva/sclera: Conjunctivae normal.  Cardiovascular:     Rate and Rhythm: Normal rate and regular rhythm.     Heart sounds: Normal heart sounds.  Pulmonary:     Effort: Pulmonary effort is normal.     Breath sounds: Normal breath sounds.  Abdominal:     General: Bowel sounds are normal.     Palpations: Abdomen is soft.  Musculoskeletal:     Cervical back: Normal range of motion.  Lymphadenopathy:     Cervical: No cervical adenopathy.  Skin:    General: Skin is warm and dry.     Capillary Refill: Capillary refill takes less than 2 seconds.  Neurological:     Mental Status: She is alert and oriented to person, place, and time.  Psychiatric:        Behavior: Behavior normal.      Musculoskeletal Exam: She had limited lateral rotation of the cervical spine.  She had thoracic kyphosis without any tenderness  over thoracic or lumbar region.  Shoulder joints, elbow joints, wrist joints in good range of motion.  She had bilateral PIP and DIP thickening with incomplete fist formation.  No synovitis was noted.  Hip joints and knee joints were in good range of motion.  There was no tenderness over ankles or MTPs.  CDAI Exam: CDAI Score: -- Patient Global: 1 mm; Provider Global: 1 mm Swollen: --; Tender: -- Joint Exam 01/21/2023   No joint exam has been documented for this visit   There is currently no information documented on the homunculus. Go to the Rheumatology activity and complete the homunculus joint exam.  Investigation: No additional findings.  Imaging: No results found.  Recent Labs: Lab Results  Component Value Date   WBC 7.1 11/08/2022   HGB 13.0 11/08/2022  PLT 264.0 11/08/2022   NA 138 11/08/2022   K 4.4 11/08/2022   CL 99 11/08/2022   CO2 28 11/08/2022   GLUCOSE 100 (H) 11/08/2022   BUN 20 11/08/2022   CREATININE 0.89 11/08/2022   BILITOT 0.4 11/08/2022   ALKPHOS 85 11/08/2022   AST 22 11/08/2022   ALT 19 11/08/2022   PROT 8.4 (H) 11/08/2022   ALBUMIN 4.1 11/08/2022   CALCIUM 10.3 11/08/2022   GFRAA 86 07/02/2020    Speciality Comments: PLQ Eye Exam 01/25/2022 WNL @ [facility] Follow up in 1 year PLQ 09/28/2021 Allopurinol- diarrhea  Procedures:  No procedures performed Allergies: Patient has no known allergies.   Assessment / Plan:     Visit Diagnoses: Rheumatoid arthritis involving multiple sites with positive rheumatoid factor (HCC) - RF+, responsive to prednisone use: Patient had no synovitis on examination today.  She denies any history of joint swelling.  She has been tolerating hydroxychloroquine 200 mg p.o. twice daily Monday to Friday without any side effects.  She initially had diarrhea which is resolved now.  High risk medication use - Plaquenil 200 mg 1 tablet by mouth twice daily Monday to Friday. PLQ Eye Exam 01/25/2022.  She will be  getting eye examination this year.  Labs obtained on November 08, 2022 CBC and CMP were normal.- Plan: CBC with Differential/Platelet, COMPLETE METABOLIC PANEL WITH GFR  Primary osteoarthritis of both hands-she has osteoarthritis in her hands with bilateral PIP and DIP thickening.  Joint protection muscle strengthening was discussed.  Primary osteoarthritis of both knees-she continues to have some pain and stiffness in her bilateral knee joints.  No warmth swelling or effusion was noted.  Primary osteoarthritis of both feet-upper fitting shoes were advised.  Neck pain -she had limited lateral rotation.  X-rays in the past were consistent with multilevel spondylosis and facet joint arthropathy.  Most severe narrowing was noted between C5-C6 and C6-C7 with anterior osteophytes.  DDD (degenerative disc disease), lumbar-she denies any lower back discomfort today.  She has intermittent discomfort.  Core strengthening exercises were discussed.  Gouty arthritis -patient denies having a flare of gouty arthropathy.  She takes colchicine only on as needed basis.  Uric acid was normal at 4.4 on July 27, 2022.  Will recheck it with the next labs.  Probenecid 500 mg 3 times daily and Mitigare 0.6 mg BID as needed during gout flares. - Plan: Uric acid  Osteopenia of multiple sites - DEXA done by PCP 09/28/19 T -2.3.  Use of calcium rich diet and vitamin D was advised.  Essential hypertension-blood pressure was normal at 134/82 today.  Other medical problems listed as follows:  History of hypothyroidism  History of hyperlipidemia  Type 2 diabetes mellitus without complication, without long-term current use of insulin (HCC)  Orders: Orders Placed This Encounter  Procedures   CBC with Differential/Platelet   COMPLETE METABOLIC PANEL WITH GFR   Uric acid   No orders of the defined types were placed in this encounter.    Follow-Up Instructions: Return in about 5 months (around 06/23/2023) for  Rheumatoid arthritis.   Pollyann Savoy, MD  Note - This record has been created using Animal nutritionist.  Chart creation errors have been sought, but may not always  have been located. Such creation errors do not reflect on  the standard of medical care.

## 2023-01-12 ENCOUNTER — Other Ambulatory Visit: Payer: Self-pay | Admitting: Rheumatology

## 2023-01-12 DIAGNOSIS — M79642 Pain in left hand: Secondary | ICD-10-CM

## 2023-01-12 DIAGNOSIS — M79671 Pain in right foot: Secondary | ICD-10-CM

## 2023-01-12 DIAGNOSIS — M0579 Rheumatoid arthritis with rheumatoid factor of multiple sites without organ or systems involvement: Secondary | ICD-10-CM

## 2023-01-12 NOTE — Telephone Encounter (Signed)
Last Fill: 11/03/2022  Eye exam: 01/25/2022 WNL    Labs: 11/08/2022 Glucose 100, Total Protein 8.4, Eosinophils Relative 5.2  Next Visit: 01/21/2023  Last Visit: 07/27/2022  [id number] arthritis involving multiple sites with positive rheumatoid factor   Current Dose per office note 07/27/2022: Plaquenil 200 mg 1 tablet by mouth twice daily Monday to Friday   Okay to refill Plaquenil?

## 2023-01-20 ENCOUNTER — Encounter: Payer: Self-pay | Admitting: Gastroenterology

## 2023-01-20 ENCOUNTER — Ambulatory Visit (AMBULATORY_SURGERY_CENTER): Payer: Medicare Other | Admitting: Gastroenterology

## 2023-01-20 VITALS — BP 174/81 | HR 61 | Temp 98.6°F | Resp 10 | Ht 63.0 in | Wt 190.0 lb

## 2023-01-20 DIAGNOSIS — D124 Benign neoplasm of descending colon: Secondary | ICD-10-CM | POA: Diagnosis not present

## 2023-01-20 DIAGNOSIS — D123 Benign neoplasm of transverse colon: Secondary | ICD-10-CM

## 2023-01-20 DIAGNOSIS — Z1211 Encounter for screening for malignant neoplasm of colon: Secondary | ICD-10-CM | POA: Diagnosis not present

## 2023-01-20 DIAGNOSIS — K635 Polyp of colon: Secondary | ICD-10-CM | POA: Diagnosis not present

## 2023-01-20 DIAGNOSIS — D122 Benign neoplasm of ascending colon: Secondary | ICD-10-CM

## 2023-01-20 HISTORY — PX: COLONOSCOPY: SHX174

## 2023-01-20 MED ORDER — SODIUM CHLORIDE 0.9 % IV SOLN: 500.0000 mL | Freq: Once | INTRAVENOUS | Status: DC

## 2023-01-20 NOTE — Progress Notes (Signed)
Pt's states no medical or surgical changes since previsit or office visit. 

## 2023-01-20 NOTE — Op Note (Signed)
[facility] Patient Name: Armya Arabian Procedure Date: 01/20/2023 10:42 AM MRN: [id number] Endoscopist: Lorin Picket E. Tomasa Rand , MD, [id number] Age: 77 Referring MD:  Date of Birth: May 04, 1946 Gender: Female Account #: [id number] Procedure:                Colonoscopy Indications:              Screening in patient at increased risk: Family                            history of 1st-degree relative with colorectal                            cancer before age 46 years Medicines:                Monitored Anesthesia Care Procedure:                Pre-Anesthesia Assessment:                           - Prior to the procedure, a History and Physical                            was performed, and patient medications and                            allergies were reviewed. The patient's tolerance of                            previous anesthesia was also reviewed. The risks                            and benefits of the procedure and the sedation                            options and risks were discussed with the patient.                            All questions were answered, and informed consent                            was obtained. Prior Anticoagulants: The patient has                            taken no anticoagulant or antiplatelet agents. ASA                            Grade Assessment: III - A patient with severe                            systemic disease. After reviewing the risks and                            benefits, the patient was deemed in satisfactory  condition to undergo the procedure.                           After obtaining informed consent, the colonoscope                            was passed under direct vision. Throughout the                            procedure, the patient's blood pressure, pulse, and                            oxygen saturations were monitored continuously. The                            Olympus CF-HQ190L  [id number]) Colonoscope was                            introduced through the anus and advanced to the the                            cecum, identified by appendiceal orifice and                            ileocecal valve. The colonoscopy was unusually                            difficult due to significant looping and a tortuous                            colon. Successful completion of the procedure was                            aided by using manual pressure and withdrawing and                            reinserting the scope. The patient tolerated the                            procedure. The quality of the bowel preparation was                            adequate. The ileocecal valve, appendiceal orifice,                            and rectum were photographed. The bowel preparation                            used was SUPREP via split dose instruction. Scope In: 10:55:49 AM Scope Out: 11:38:40 AM Scope Withdrawal Time: 0 hours 24 minutes 0 seconds  Total Procedure Duration: 0 hours 42 minutes 51 seconds  Findings:                 The perianal and digital rectal examinations were  normal. Pertinent negatives include normal                            sphincter tone and no palpable rectal lesions.                           A 5 mm polyp was found in the cecum. The polyp was                            sessile. This polyp was not removed because of                            inability to achieve stable scope position in the                            cecum.                           Two sessile polyps were found in the ascending                            colon. The polyps were 5 to 8 mm in size. These                            polyps were removed with a cold snare. Resection                            and retrieval were complete. Estimated blood loss                            was minimal.                           A 6 mm polyp was found in the ascending colon.  The                            polyp was sessile. This polyp was not removed due                            to significant looping and inability to get into                            proper positioning for polypectomy                           A 4 mm polyp was found in the splenic flexure. The                            polyp was sessile. The polyp was removed with a                            cold snare. Resection and retrieval were complete.  Estimated blood loss was minimal.                           Six sessile polyps were found in the transverse                            colon. The polyps were 3 to 7 mm in size. These                            polyps were removed with a cold snare. Resection                            and retrieval were complete. Estimated blood loss                            was minimal.                           A 5 mm polyp was found in the descending colon. The                            polyp was sessile. The polyp was removed with a                            cold snare. Resection and retrieval were complete.                            Estimated blood loss was minimal.                           A 25 mm polyp was found in the transverse colon.                            The polyp was sessile. Area was tattooed with an                            injection of 2 mL of [place] ink. Estimated blood                            loss was minimal.                           A few medium-mouthed and small-mouthed diverticula                            were found in the sigmoid colon.                           The exam was otherwise normal throughout the                            examined colon.  Non-bleeding internal hemorrhoids were found during                            retroflexion. The hemorrhoids were Grade I                            (internal hemorrhoids that do not prolapse).                           No  additional abnormalities were found on                            retroflexion. Complications:            No immediate complications. Estimated Blood Loss:     Estimated blood loss was minimal. Impression:               - One 5 mm polyp in the cecum.                           - Two 5 to 8 mm polyps in the ascending colon,                            removed with a cold snare. Resected and retrieved.                           - One 6 mm polyp in the ascending colon.                           - One 4 mm polyp at the splenic flexure, removed                            with a cold snare. Resected and retrieved.                           - Six 3 to 7 mm polyps in the transverse colon,                            removed with a cold snare. Resected and retrieved.                           - One 5 mm polyp in the descending colon, removed                            with a cold snare. Resected and retrieved.                           - One 25 mm polyp in the transverse colon.                            Tattooed. This polyp will need to be removed with  EMR in the hospital.                           - Diverticulosis in the sigmoid colon.                           - Non-bleeding internal hemorrhoids. Recommendation:           - Patient has a contact number available for                            emergencies. The signs and symptoms of potential                            delayed complications were discussed with the                            patient. Return to normal activities tomorrow.                            Written discharge instructions were provided to the                            patient.                           - Resume previous diet.                           - Continue present medications.                           - Await pathology results.                           - Repeat colonoscopy in the hospital at appointment                            to be  scheduled to remove large polyp and other                            polyps not removed. Jerik Falletta E. Tomasa Rand, MD 01/20/2023 11:50:03 AM This report has been signed electronically.

## 2023-01-20 NOTE — Progress Notes (Signed)
[facility] History and Physical   Primary Care Physician:  Georgina Quint, MD   Reason for Procedure:   Colon cancer screening  Plan:    Screening colonoscopy     HPI: Isabella Dixon is a 77 y.o. female undergoing screening colonoscopy.  She has recurrent rectal bleeding attributed to hemorrhoids, otherwise no chronic GI symptoms.  Her sister was diagnosed with colon cancer in her 28s.  Her last colonoscopy was reportedly in 2012 [facility]?) and she thinks she had a polyp removed.  She denies being advised to have a colonoscopy every 5 years.   Past Medical History:  Diagnosis Date   Anemia    Arthritis    Cataract    beginning   Diabetes mellitus without complication (HCC)    Gout    High cholesterol    Hypertension    Osteoporosis    Thyroid disease     Past Surgical History:  Procedure Laterality Date   ABDOMINAL HYSTERECTOMY     COLONOSCOPY     x 2   TUBAL LIGATION      Prior to Admission medications   Medication Sig Start Date End Date Taking? Authorizing Provider  chlorpheniramine (CHLOR-TRIMETON) 4 MG tablet Take 4 mg by mouth 2 (two) times daily as needed for allergies.   Yes [provider]  Cholecalciferol (VITAMIN D-3 PO) Take 2,000 Units by mouth daily.   Yes [provider]  hydroxychloroquine (PLAQUENIL) 200 MG tablet TAKE 1 TABLET TWICE A DAY  MONDAY THROUGH FRIDAY ONLY.DO NOT TAKE ON SATURDAY ANDSUNDAY 01/12/23  Yes Gearldine Bienenstock, PA-C  levothyroxine (SYNTHROID) 75 MCG tablet Take 1 tablet (75 mcg total) by mouth daily. 05/10/22 05/05/23 Yes Sagardia, Eilleen Kempf, MD  losartan (COZAAR) 100 MG tablet TAKE 1 TABLET DAILY 06/21/22  Yes Georgina Quint, MD  metFORMIN (GLUCOPHAGE) 500 MG tablet Take 1 tablet (500 mg total) by mouth 2 (two) times daily with a meal. 05/10/22 05/05/23 Yes Sagardia, Eilleen Kempf, MD  metoprolol tartrate (LOPRESSOR) 25 MG tablet Take 1 tablet (25 mg total) by mouth 2 (two) times daily.  05/10/22 05/05/23 Yes Sagardia, Eilleen Kempf, MD  Multiple Vitamin (MULTIVITAMIN) tablet Take 1 tablet by mouth daily.   Yes [provider]  probenecid (BENEMID) 500 MG tablet TAKE 1 TABLET 3 TIMES A DAY 07/09/22  Yes Sagardia, Eilleen Kempf, MD  rosuvastatin (CRESTOR) 10 MG tablet TAKE 1 TABLET DAILY 06/21/22  Yes Sagardia, Eilleen Kempf, MD  vitamin C (ASCORBIC ACID) 250 MG tablet Take 250 mg by mouth daily.   Yes [provider]  Colchicine (MITIGARE) 0.6 MG CAPS TAKE 2 CAPSULES BY MOUTH DAILY AS NEEDED Patient not taking: Reported on 12/29/2022    [provider]    Current Outpatient Medications  Medication Sig Dispense Refill   chlorpheniramine (CHLOR-TRIMETON) 4 MG tablet Take 4 mg by mouth 2 (two) times daily as needed for allergies.     Cholecalciferol (VITAMIN D-3 PO) Take 2,000 Units by mouth daily.     hydroxychloroquine (PLAQUENIL) 200 MG tablet TAKE 1 TABLET TWICE A DAY  MONDAY THROUGH FRIDAY ONLY.DO NOT TAKE ON SATURDAY ANDSUNDAY 120 tablet 0   levothyroxine (SYNTHROID) 75 MCG tablet Take 1 tablet (75 mcg total) by mouth daily. 90 tablet 3   losartan (COZAAR) 100 MG tablet TAKE 1 TABLET DAILY 90 tablet 3   metFORMIN (GLUCOPHAGE) 500 MG tablet Take 1 tablet (500 mg total) by mouth 2 (two) times daily with a meal. 180 tablet  3   metoprolol tartrate (LOPRESSOR) 25 MG tablet Take 1 tablet (25 mg total) by mouth 2 (two) times daily. 180 tablet 3   Multiple Vitamin (MULTIVITAMIN) tablet Take 1 tablet by mouth daily.     probenecid (BENEMID) 500 MG tablet TAKE 1 TABLET 3 TIMES A DAY 270 tablet 3   rosuvastatin (CRESTOR) 10 MG tablet TAKE 1 TABLET DAILY 90 tablet 3   vitamin C (ASCORBIC ACID) 250 MG tablet Take 250 mg by mouth daily.     Colchicine (MITIGARE) 0.6 MG CAPS TAKE 2 CAPSULES BY MOUTH DAILY AS NEEDED (Patient not taking: Reported on 12/29/2022)     Current Facility-Administered Medications  Medication Dose Route Frequency Provider Last Rate Last Admin    0.9 %  sodium chloride infusion  500 mL Intravenous Once Jenel Lucks, MD        Allergies as of 01/20/2023   (No Known Allergies)    Family History  Problem Relation Age of Onset   Heart disease Mother    Stroke Mother    Hypertension Mother    Stroke Father    Colon cancer Sister        died earl 56's   ALS Brother    Heart disease Brother    Stroke Maternal Grandmother    Stroke Paternal Grandmother    Cancer Paternal Grandmother    Cancer Paternal Grandfather    SIDS Son    Testicular cancer Son    Down syndrome Son    Colon polyps Neg Hx    Crohn's disease Neg Hx    Esophageal cancer Neg Hx    Stomach cancer Neg Hx    Ulcerative colitis Neg Hx    Rectal cancer Neg Hx     Social History   Socioeconomic History   Marital status: Married    Spouse name: Not on file   Number of children: Not on file   Years of education: Not on file   Highest education level: Not on file  Occupational History   Not on file  Tobacco Use   Smoking status: Never    Passive exposure: Past   Smokeless tobacco: Never  Vaping Use   Vaping Use: Never used  Substance and Sexual Activity   Alcohol use: No   Drug use: No   Sexual activity: Yes  Other Topics Concern   Not on file  Social History Narrative   Not on file   Social Determinants of Health   Financial Resource Strain: Low Risk  (08/03/2022)   Overall Financial Resource Strain (CARDIA)    Difficulty of Paying Living Expenses: Not hard at all  Food Insecurity: No Food Insecurity (08/03/2022)   Hunger Vital Sign    Worried About Running Out of Food in the Last Year: Never true    Ran Out of Food in the Last Year: Never true  Transportation Needs: No Transportation Needs (08/03/2022)   PRAPARE - Administrator, Civil Service (Medical): No    Lack of Transportation (Non-Medical): No  Physical Activity: Sufficiently Active (08/03/2022)   Exercise Vital Sign    Days of Exercise per Week: 5 days     Minutes of Exercise per Session: 30 min  Stress: No Stress Concern Present (08/03/2022)   [employer] of Occupational Health - Occupational Stress Questionnaire    Feeling of Stress : Not at all  Social Connections: Socially Integrated (08/03/2022)   Social Connection and Isolation Panel [NHANES]    Frequency of  Communication with Friends and Family: More than three times a week    Frequency of Social Gatherings with Friends and Family: More than three times a week    Attends Religious Services: More than 4 times per year    Active Member of [employer] or Organizations: Yes    Attends Engineer, structural: More than 4 times per year    Marital Status: Married  Catering manager Violence: Not At Risk (08/03/2022)   Humiliation, Afraid, Rape, and Kick questionnaire    Fear of Current or Ex-Partner: No    Emotionally Abused: No    Physically Abused: No    Sexually Abused: No    Review of Systems:  All other review of systems negative except as mentioned in the HPI.  Physical Exam: Vital signs BP (!) 161/79   Pulse 61   Temp 98.6 F (37 C)   Ht 5\' 3"  (1.6 m)   Wt 190 lb (86.2 kg)   SpO2 98%   BMI 33.66 kg/m   General:   Alert,  Well-developed, well-nourished, pleasant and cooperative in NAD Airway:  Mallampati 2 Lungs:  Clear throughout to auscultation.   Heart:  Regular rate and rhythm; no murmurs, clicks, rubs,  or gallops. Abdomen:  Soft, nontender and nondistended. Normal bowel sounds.   Neuro/Psych:  Normal mood and affect. A and O x 3   Jalei Shibley E. Tomasa Rand, MD [facility]

## 2023-01-20 NOTE — Progress Notes (Signed)
A and O x3. Report to RN. Tolerated MAC anesthesia well. 

## 2023-01-20 NOTE — Patient Instructions (Signed)
Handouts given on polyps, diverticulosis and hemorrhoids.  YOU HAD AN ENDOSCOPIC PROCEDURE TODAY AT THE [facility]:   Refer to the procedure report that was given to you for any specific questions about what was found during the examination.  If the procedure report does not answer your questions, please call your gastroenterologist to clarify.  If you requested that your care partner not be given the details of your procedure findings, then the procedure report has been included in a sealed envelope for you to review at your convenience later.  YOU SHOULD EXPECT: Some feelings of bloating in the abdomen. Passage of more gas than usual.  Walking can help get rid of the air that was put into your GI tract during the procedure and reduce the bloating. If you had a lower endoscopy (such as a colonoscopy or flexible sigmoidoscopy) you may notice spotting of blood in your stool or on the toilet paper. If you underwent a bowel prep for your procedure, you may not have a normal bowel movement for a few days.  Please Note:  You might notice some irritation and congestion in your nose or some drainage.  This is from the oxygen used during your procedure.  There is no need for concern and it should clear up in a day or so.  SYMPTOMS TO REPORT IMMEDIATELY:  Following lower endoscopy (colonoscopy or flexible sigmoidoscopy):  Excessive amounts of blood in the stool  Significant tenderness or worsening of abdominal pains  Swelling of the abdomen that is new, acute  Fever of 100F or higher  For urgent or emergent issues, a gastroenterologist can be reached at any hour by calling [phone]. Do not use MyChart messaging for urgent concerns.    DIET:  We do recommend a small meal at first, but then you may proceed to your regular diet.  Drink plenty of fluids but you should avoid alcoholic beverages for 24 hours.  ACTIVITY:  You should plan to take it easy for the rest of today and you should  NOT DRIVE or use heavy machinery until tomorrow (because of the sedation medicines used during the test).    FOLLOW UP: Our staff will call the number listed on your records the next business day following your procedure.  We will call around 7:15- 8:00 am to check on you and address any questions or concerns that you may have regarding the information given to you following your procedure. If we do not reach you, we will leave a message.     If any biopsies were taken you will be contacted by phone or by letter within the next 1-3 weeks.  Please call us at [phone] if you have not heard about the biopsies in 3 weeks.    SIGNATURES/CONFIDENTIALITY: You and/or your care partner have signed paperwork which will be entered into your electronic medical record.  These signatures attest to the fact that that the information above on your After Visit Summary has been reviewed and is understood.  Full responsibility of the confidentiality of this discharge information lies with you and/or your care-partner. 

## 2023-01-20 NOTE — Progress Notes (Signed)
Called to room to assist during endoscopic procedure.  Patient ID and intended procedure confirmed with present staff. Received instructions for my participation in the procedure from the performing physician.  

## 2023-01-21 ENCOUNTER — Telehealth: Payer: Self-pay

## 2023-01-21 ENCOUNTER — Ambulatory Visit: Payer: Medicare Other | Attending: Rheumatology | Admitting: Rheumatology

## 2023-01-21 ENCOUNTER — Encounter: Payer: Self-pay | Admitting: Rheumatology

## 2023-01-21 VITALS — BP 134/82 | HR 72 | Resp 14 | Ht 63.0 in | Wt 192.4 lb

## 2023-01-21 DIAGNOSIS — M5136 Other intervertebral disc degeneration, lumbar region: Secondary | ICD-10-CM

## 2023-01-21 DIAGNOSIS — M170 Bilateral primary osteoarthritis of knee: Secondary | ICD-10-CM | POA: Diagnosis not present

## 2023-01-21 DIAGNOSIS — M0579 Rheumatoid arthritis with rheumatoid factor of multiple sites without organ or systems involvement: Secondary | ICD-10-CM

## 2023-01-21 DIAGNOSIS — M19072 Primary osteoarthritis, left ankle and foot: Secondary | ICD-10-CM

## 2023-01-21 DIAGNOSIS — M19042 Primary osteoarthritis, left hand: Secondary | ICD-10-CM

## 2023-01-21 DIAGNOSIS — Z79899 Other long term (current) drug therapy: Secondary | ICD-10-CM | POA: Diagnosis not present

## 2023-01-21 DIAGNOSIS — E119 Type 2 diabetes mellitus without complications: Secondary | ICD-10-CM

## 2023-01-21 DIAGNOSIS — I10 Essential (primary) hypertension: Secondary | ICD-10-CM

## 2023-01-21 DIAGNOSIS — M19041 Primary osteoarthritis, right hand: Secondary | ICD-10-CM | POA: Diagnosis not present

## 2023-01-21 DIAGNOSIS — M8589 Other specified disorders of bone density and structure, multiple sites: Secondary | ICD-10-CM

## 2023-01-21 DIAGNOSIS — M19071 Primary osteoarthritis, right ankle and foot: Secondary | ICD-10-CM

## 2023-01-21 DIAGNOSIS — M542 Cervicalgia: Secondary | ICD-10-CM

## 2023-01-21 DIAGNOSIS — Z8639 Personal history of other endocrine, nutritional and metabolic disease: Secondary | ICD-10-CM

## 2023-01-21 DIAGNOSIS — M109 Gout, unspecified: Secondary | ICD-10-CM

## 2023-01-21 NOTE — Patient Instructions (Signed)
Standing Labs We placed an order today for your standing lab work.   Please have your standing labs drawn in August  Please have your labs drawn 2 weeks prior to your appointment so that the provider can discuss your lab results at your appointment, if possible.  Please note that you may see your imaging and lab results in MyChart before we have reviewed them. We will contact you once all results are reviewed. Please allow our office up to 72 hours to thoroughly review all of the results before contacting the office for clarification of your results.  WALK-IN LAB HOURS  Monday through Thursday from 8:00 am -12:30 pm and 1:00 pm-5:00 pm and Friday from 8:00 am-12:00 pm.  Patients with office visits requiring labs will be seen before walk-in labs.  You may encounter longer than normal wait times. Please allow additional time. Wait times may be shorter on  Monday and Thursday afternoons.  We do not book appointments for walk-in labs. We appreciate your patience and understanding with our staff.   Labs are drawn by [facility]. Please bring your co-pay at the time of your lab draw.  You may receive a bill from [facility] for your lab work.  Please note if you are on Hydroxychloroquine and and an order has been placed for a Hydroxychloroquine level,  you will need to have it drawn 4 hours or more after your last dose.  If you wish to have your labs drawn at another location, please call the office 24 hours in advance so we can fax the orders.  The office is located at [address], [address], [place], [place] [address]   If you have any questions regarding directions or hours of operation,  please call [phone].   As a reminder, please drink plenty of water prior to coming for your lab work. Thanks!   Vaccines You are taking a medication(s) that can suppress your immune system.  The following immunizations are recommended: Flu annually Covid-19  Td/Tdap (tetanus, diphtheria, pertussis) every  10 years Pneumonia (Prevnar 15 then Pneumovax 23 at least 1 year apart.  Alternatively, can take Prevnar 20 without needing additional dose) Shingrix: 2 doses from 4 weeks to 6 months apart  Please check with your PCP to make sure you are up to date.  

## 2023-01-21 NOTE — Telephone Encounter (Signed)
  Follow up Call-     01/20/2023   10:10 AM  Call back number  Post procedure Call Back phone  # [phone]  Permission to leave phone message Yes     Patient questions:  Do you have a fever, pain , or abdominal swelling? Yes.   Pain Score  3 *  Have you tolerated food without any problems? Yes.    Have you been able to return to your normal activities? Yes.    Do you have any questions about your discharge instructions: Diet   No. Medications  No. Follow up visit  No.  Do you have questions or concerns about your Care? No.  Actions: * If pain score is 4 or above: No action needed, pain <4.  Pts abdomen is still sore after procedure. States that it has gotten better since yesterday. She is eating, drinking, passing gas and has had a bowel movement since the procedure. Will call back if pain does not go away or gets worse.

## 2023-01-29 ENCOUNTER — Encounter: Payer: Self-pay | Admitting: Gastroenterology

## 2023-01-31 LAB — HM DIABETES EYE EXAM

## 2023-02-23 ENCOUNTER — Telehealth: Payer: Self-pay

## 2023-02-23 NOTE — Telephone Encounter (Signed)
Left Vm for patient to return call to discuss hospital openings.

## 2023-02-25 NOTE — Telephone Encounter (Signed)
Patient is returning your call , please call her at [phone]. Thanks

## 2023-03-01 ENCOUNTER — Other Ambulatory Visit: Payer: Self-pay

## 2023-03-01 DIAGNOSIS — Z800 Family history of malignant neoplasm of digestive organs: Secondary | ICD-10-CM

## 2023-03-01 DIAGNOSIS — D123 Benign neoplasm of transverse colon: Secondary | ICD-10-CM

## 2023-03-01 DIAGNOSIS — D122 Benign neoplasm of ascending colon: Secondary | ICD-10-CM

## 2023-03-01 DIAGNOSIS — D124 Benign neoplasm of descending colon: Secondary | ICD-10-CM

## 2023-03-01 MED ORDER — NA SULFATE-K SULFATE-MG SULF 17.5-3.13-1.6 GM/177ML PO SOLN: 1.0000 | Freq: Once | ORAL | 0 refills | Status: AC

## 2023-03-01 NOTE — Addendum Note (Signed)
Addended by: Justice Britain on: 03/01/2023 04:15 PM   Modules accepted: Orders

## 2023-03-01 NOTE — Telephone Encounter (Signed)
Returned patient's call. She is scheduled for 05/19/23

## 2023-03-20 ENCOUNTER — Other Ambulatory Visit: Payer: Self-pay | Admitting: Physician Assistant

## 2023-03-20 DIAGNOSIS — M0579 Rheumatoid arthritis with rheumatoid factor of multiple sites without organ or systems involvement: Secondary | ICD-10-CM

## 2023-03-20 DIAGNOSIS — M79671 Pain in right foot: Secondary | ICD-10-CM

## 2023-03-20 DIAGNOSIS — M79641 Pain in right hand: Secondary | ICD-10-CM

## 2023-03-21 NOTE — Telephone Encounter (Signed)
Last Fill: 01/12/2023  Eye exam: 01/31/2023 WNL   Labs: 11/08/2022 Eosinophils 5.2, Glucose 100, Total Protein 8.4  Next Visit: 06/22/2023  Last Visit: 01/21/2023  DX: Rheumatoid arthritis involving multiple sites with positive rheumatoid factor   Current Dose per office note 01/21/2023: Plaquenil 200 mg 1 tablet by mouth twice daily Monday to Friday   Okay to refill Plaquenil?

## 2023-05-09 ENCOUNTER — Telehealth: Payer: Self-pay | Admitting: Gastroenterology

## 2023-05-09 MED ORDER — NA SULFATE-K SULFATE-MG SULF 17.5-3.13-1.6 GM/177ML PO SOLN: 1.0000 | Freq: Once | ORAL | 0 refills | Status: AC

## 2023-05-09 NOTE — Telephone Encounter (Signed)
Patient called said she has not heard from anyone at the hospital and also does not have her prep medication has of yet her procedure is scheduled for 05/19/23.

## 2023-05-09 NOTE — Telephone Encounter (Signed)
Left message for patient that I sent her Suprep kit to [facility], Her instructions are typed up, seems she does not have My Chart. I will mail them to her

## 2023-05-10 NOTE — Progress Notes (Signed)
Anesthesia Review:  PCP: Sagardia LOV 11/08/22  Cardiologist : Chest x-ray : EKG : Echo : Stress test: Cardiac Cath :  Activity level:  Sleep Study/ CPAP : Fasting Blood Sugar :      / Checks Blood Sugar -- times a day:   Blood Thinner/ Instructions /Last Dose: ASA / Instructions/ Last Dose :    DM- type  Metformin-

## 2023-05-11 ENCOUNTER — Ambulatory Visit (INDEPENDENT_AMBULATORY_CARE_PROVIDER_SITE_OTHER): Payer: Medicare Other | Admitting: Emergency Medicine

## 2023-05-11 ENCOUNTER — Encounter: Payer: Self-pay | Admitting: Emergency Medicine

## 2023-05-11 VITALS — BP 142/82 | HR 74 | Temp 98.7°F | Ht 63.0 in | Wt 192.6 lb

## 2023-05-11 DIAGNOSIS — M0579 Rheumatoid arthritis with rheumatoid factor of multiple sites without organ or systems involvement: Secondary | ICD-10-CM

## 2023-05-11 DIAGNOSIS — Z23 Encounter for immunization: Secondary | ICD-10-CM | POA: Diagnosis not present

## 2023-05-11 DIAGNOSIS — E1169 Type 2 diabetes mellitus with other specified complication: Secondary | ICD-10-CM

## 2023-05-11 DIAGNOSIS — I152 Hypertension secondary to endocrine disorders: Secondary | ICD-10-CM

## 2023-05-11 DIAGNOSIS — E1159 Type 2 diabetes mellitus with other circulatory complications: Secondary | ICD-10-CM | POA: Diagnosis not present

## 2023-05-11 DIAGNOSIS — Z7984 Long term (current) use of oral hypoglycemic drugs: Secondary | ICD-10-CM

## 2023-05-11 DIAGNOSIS — E039 Hypothyroidism, unspecified: Secondary | ICD-10-CM

## 2023-05-11 DIAGNOSIS — E785 Hyperlipidemia, unspecified: Secondary | ICD-10-CM

## 2023-05-11 DIAGNOSIS — Z8739 Personal history of other diseases of the musculoskeletal system and connective tissue: Secondary | ICD-10-CM

## 2023-05-11 NOTE — Progress Notes (Signed)
Isabella Dixon 77 y.o.   Chief Complaint  Patient presents with   Medical Management of Chronic Issues    6 month f/u. No other concerns. Is wanting the flu shot today    HISTORY OF PRESENT ILLNESS: This is a 77 y.o. female A1A here for follow-up of chronic medical conditions Recently had colonoscopy.  Several polyps removed Scheduled for additional colonoscopy next week to remove larger polyps Otherwise doing well.  No other complaints or medical concerns today. BP Readings from Last 3 Encounters:  05/11/23 (!) 142/82  01/21/23 134/82  01/20/23 (!) 174/81   Lab Results  Component Value Date   HGBA1C 6.2 11/08/2022   Wt Readings from Last 3 Encounters:  05/11/23 192 lb 9.6 oz (87.4 kg)  01/21/23 192 lb 6.4 oz (87.3 kg)  01/20/23 190 lb (86.2 kg)     HPI   Prior to Admission medications   Medication Sig Start Date End Date Taking? Authorizing Provider  chlorpheniramine (CHLOR-TRIMETON) 4 MG tablet Take 4 mg by mouth daily.   Yes [provider]  Cholecalciferol (VITAMIN D-3 PO) Take 2,000 Units by mouth daily.   Yes [provider]  Colchicine (MITIGARE) 0.6 MG CAPS as needed.   Yes [provider]  hydroxychloroquine (PLAQUENIL) 200 MG tablet TAKE 1 TABLET TWICE A DAY  MONDAY THROUGH FRIDAY ONLY.DO NOT TAKE ON SATURDAY ANDSUNDAY 03/21/23  Yes Gearldine Bienenstock, PA-C  losartan (COZAAR) 100 MG tablet TAKE 1 TABLET DAILY 06/21/22  Yes Lynne Takemoto, Eilleen Kempf, MD  Multiple Vitamin (MULTIVITAMIN) tablet Take 1 tablet by mouth daily.   Yes [provider]  probenecid (BENEMID) 500 MG tablet TAKE 1 TABLET 3 TIMES A DAY 07/09/22  Yes Nakima Fluegge, Eilleen Kempf, MD  rosuvastatin (CRESTOR) 10 MG tablet TAKE 1 TABLET DAILY 06/21/22  Yes Emmette Katt, Eilleen Kempf, MD  vitamin C (ASCORBIC ACID) 250 MG tablet Take 250 mg by mouth daily.   Yes [provider]  levothyroxine (SYNTHROID) 75 MCG tablet Take 1 tablet (75 mcg total) by mouth daily. 05/10/22  05/05/23  Georgina Quint, MD  metFORMIN (GLUCOPHAGE) 500 MG tablet Take 1 tablet (500 mg total) by mouth 2 (two) times daily with a meal. 05/10/22 05/05/23  Christi Wirick, Eilleen Kempf, MD  metoprolol tartrate (LOPRESSOR) 25 MG tablet Take 1 tablet (25 mg total) by mouth 2 (two) times daily. 05/10/22 05/05/23  Georgina Quint, MD    No Known Allergies  Patient Active Problem List   Diagnosis Date Noted   Rheumatoid arthritis involving multiple sites with positive rheumatoid factor (HCC) 11/04/2021   Polyarthralgia 07/17/2021   Osteoporosis 05/05/2021   Primary osteoarthritis of both hands 06/21/2019   Primary osteoarthritis of both feet 06/21/2019   Primary osteoarthritis of both knees 06/21/2019   History of sciatica 09/06/2017   Obesity, diabetes, and hypertension syndrome (HCC) 09/06/2017   History of gout 09/06/2017   Dyslipidemia associated with type 2 diabetes mellitus (HCC) 05/13/2015   Hypothyroid 07/25/2012   Gouty arthritis 05/02/2012   Osteoarthritis 05/02/2012   Hypertension associated with diabetes (HCC) 05/02/2012   Hyperlipidemia 05/02/2012   Abnormal Papanicolaou smear of vagina 05/02/2012    Past Medical History:  Diagnosis Date   Anemia    Arthritis    Cataract    beginning   Diabetes mellitus without complication (HCC)    Gout    High cholesterol    Hypertension    Osteoporosis    Thyroid disease     Past Surgical History:  Procedure  Laterality Date   ABDOMINAL HYSTERECTOMY     COLONOSCOPY     x 2   COLONOSCOPY  01/20/2023   TUBAL LIGATION      Social History   Socioeconomic History   Marital status: Married    Spouse name: Not on file   Number of children: Not on file   Years of education: Not on file   Highest education level: Not on file  Occupational History   Not on file  Tobacco Use   Smoking status: Never    Passive exposure: Past   Smokeless tobacco: Never  Vaping Use   Vaping status: Never Used  Substance and Sexual  Activity   Alcohol use: No   Drug use: No   Sexual activity: Yes  Other Topics Concern   Not on file  Social History Narrative   Not on file   Social Determinants of Health   Financial Resource Strain: Low Risk  (08/03/2022)   Overall Financial Resource Strain (CARDIA)    Difficulty of Paying Living Expenses: Not hard at all  Food Insecurity: No Food Insecurity (08/03/2022)   Hunger Vital Sign    Worried About Running Out of Food in the Last Year: Never true    Ran Out of Food in the Last Year: Never true  Transportation Needs: No Transportation Needs (08/03/2022)   PRAPARE - Administrator, Civil Service (Medical): No    Lack of Transportation (Non-Medical): No  Physical Activity: Sufficiently Active (08/03/2022)   Exercise Vital Sign    Days of Exercise per Week: 5 days    Minutes of Exercise per Session: 30 min  Stress: No Stress Concern Present (08/03/2022)   [employer] of Occupational Health - Occupational Stress Questionnaire    Feeling of Stress : Not at all  Social Connections: Socially Integrated (08/03/2022)   Social Connection and Isolation Panel [NHANES]    Frequency of Communication with Friends and Family: More than three times a week    Frequency of Social Gatherings with Friends and Family: More than three times a week    Attends Religious Services: More than 4 times per year    Active Member of [employer] or Organizations: Yes    Attends Engineer, structural: More than 4 times per year    Marital Status: Married  Catering manager Violence: Not At Risk (08/03/2022)   Humiliation, Afraid, Rape, and Kick questionnaire    Fear of Current or Ex-Partner: No    Emotionally Abused: No    Physically Abused: No    Sexually Abused: No    Family History  Problem Relation Age of Onset   Heart disease Mother    Stroke Mother    Hypertension Mother    Stroke Father    Colon cancer Sister        died earl 39's   ALS Brother    Heart disease  Brother    Stroke Maternal Grandmother    Stroke Paternal Grandmother    Cancer Paternal Grandmother    Cancer Paternal Grandfather    SIDS Son    Testicular cancer Son    Down syndrome Son    Colon polyps Neg Hx    Crohn's disease Neg Hx    Esophageal cancer Neg Hx    Stomach cancer Neg Hx    Ulcerative colitis Neg Hx    Rectal cancer Neg Hx      Review of Systems  Constitutional: Negative.  Negative for chills and fever.  HENT: Negative.  Negative for congestion and sore throat.   Respiratory: Negative.  Negative for cough and shortness of breath.   Cardiovascular: Negative.  Negative for chest pain and palpitations.  Gastrointestinal:  Negative for abdominal pain, diarrhea, nausea and vomiting.  Genitourinary: Negative.  Negative for dysuria and hematuria.  Skin: Negative.  Negative for rash.  Neurological: Negative.  Negative for dizziness and headaches.  All other systems reviewed and are negative.   Vitals:   05/11/23 0949  BP: (!) 142/82  Pulse: 74  Temp: 98.7 F (37.1 C)  SpO2: 98%    Physical Exam Vitals reviewed.  Constitutional:      Appearance: Normal appearance.  HENT:     Head: Normocephalic.     Mouth/Throat:     Mouth: Mucous membranes are moist.     Pharynx: Oropharynx is clear.  Eyes:     Extraocular Movements: Extraocular movements intact.     Pupils: Pupils are equal, round, and reactive to light.  Cardiovascular:     Rate and Rhythm: Normal rate and regular rhythm.     Pulses: Normal pulses.     Heart sounds: Normal heart sounds.  Pulmonary:     Effort: Pulmonary effort is normal.  Abdominal:     Palpations: Abdomen is soft.     Tenderness: There is no abdominal tenderness.  Musculoskeletal:     Cervical back: No tenderness.  Lymphadenopathy:     Cervical: No cervical adenopathy.  Skin:    General: Skin is warm and dry.     Capillary Refill: Capillary refill takes less than 2 seconds.  Neurological:     General: No focal deficit  present.     Mental Status: She is alert and oriented to person, place, and time.  Psychiatric:        Mood and Affect: Mood normal.        Behavior: Behavior normal.      ASSESSMENT & PLAN: A total of 42 minutes was spent with the patient and counseling/coordination of care regarding preparing for this visit, review of most recent office visit notes, review of multiple chronic medical conditions and their management, review of most recent blood work results, review of all medications, cardiovascular risks associated with hypertension and diabetes, education on nutrition, review of most recent colonoscopy report with finding of multiple polyps, prognosis, documentation and need for follow-up.  Problem List Items Addressed This Visit       Cardiovascular and Mediastinum   Hypertension associated with diabetes (HCC) - Primary    BP Readings from Last 3 Encounters:  05/11/23 (!) 142/82  01/21/23 134/82  01/20/23 (!) 174/81  Elevated blood pressure reading in the office but normal at home Continue losartan 100 mg daily and metoprolol tartrate 25 mg twice a day Well-controlled diabetes Lab Results  Component Value Date   HGBA1C 6.2 11/08/2022  Continue metformin 500 mg twice a day Cardiovascular risks associated with hypertension and diabetes discussed Diet and nutrition discussed         Endocrine   Hypothyroid    Clinically euthyroid Continue Synthroid 75 mcg daily Lab Results  Component Value Date   TSH 1.06 11/08/2022         Dyslipidemia associated with type 2 diabetes mellitus (HCC)    Chronic stable conditions Diet and nutrition discussed Continue rosuvastatin 10 mg daily         Musculoskeletal and Integument   Rheumatoid arthritis involving multiple sites with positive rheumatoid factor (HCC)  Chronic stable condition Continue Plaquenil 200 mg twice a day.  Skipping doses on Saturday and Sunday as instructed.        Other   History of gout     Continues probenecid 500 mg 3 times a day      Other Visit Diagnoses     Need for vaccination       Relevant Orders   Flu Vaccine Trivalent High Dose (Fluad) (Completed)      Patient Instructions  Health Maintenance After Age 73 After age 72, you are at a higher risk for certain long-term diseases and infections as well as injuries from falls. Falls are a major cause of broken bones and head injuries in people who are older than age 77. Getting regular preventive care can help to keep you healthy and well. Preventive care includes getting regular testing and making lifestyle changes as recommended by your health care provider. Talk with your health care provider about: Which screenings and tests you should have. A screening is a test that checks for a disease when you have no symptoms. A diet and exercise plan that is right for you. What should I know about screenings and tests to prevent falls? Screening and testing are the best ways to find a health problem early. Early diagnosis and treatment give you the best chance of managing medical conditions that are common after age 2. Certain conditions and lifestyle choices may make you more likely to have a fall. Your health care provider may recommend: Regular vision checks. Poor vision and conditions such as cataracts can make you more likely to have a fall. If you wear glasses, make sure to get your prescription updated if your vision changes. Medicine review. Work with your health care provider to regularly review all of the medicines you are taking, including over-the-counter medicines. Ask your health care provider about any side effects that may make you more likely to have a fall. Tell your health care provider if any medicines that you take make you feel dizzy or sleepy. Strength and balance checks. Your health care provider may recommend certain tests to check your strength and balance while standing, walking, or changing positions. Foot  health exam. Foot pain and numbness, as well as not wearing proper footwear, can make you more likely to have a fall. Screenings, including: Osteoporosis screening. Osteoporosis is a condition that causes the bones to get weaker and break more easily. Blood pressure screening. Blood pressure changes and medicines to control blood pressure can make you feel dizzy. Depression screening. You may be more likely to have a fall if you have a fear of falling, feel depressed, or feel unable to do activities that you used to do. Alcohol use screening. Using too much alcohol can affect your balance and may make you more likely to have a fall. Follow these instructions at home: Lifestyle Do not drink alcohol if: Your health care provider tells you not to drink. If you drink alcohol: Limit how much you have to: 0-1 drink a day for women. 0-2 drinks a day for men. Know how much alcohol is in your drink. In the [place] one drink equals one 12 oz bottle of beer (355 mL), one 5 oz glass of wine (148 mL), or one 1 oz glass of hard liquor (44 mL). Do not use any products that contain nicotine or tobacco. These products include cigarettes, chewing tobacco, and vaping devices, such as e-cigarettes. If you need help quitting, ask your health care  provider. Activity  Follow a regular exercise program to stay fit. This will help you maintain your balance. Ask your health care provider what types of exercise are appropriate for you. If you need a cane or walker, use it as recommended by your health care provider. Wear supportive shoes that have nonskid soles. Safety  Remove any tripping hazards, such as rugs, cords, and clutter. Install safety equipment such as grab bars in bathrooms and safety rails on stairs. Keep rooms and walkways well-lit. General instructions Talk with your health care provider about your risks for falling. Tell your health care provider if: You fall. Be sure to tell your health care  provider about all falls, even ones that seem minor. You feel dizzy, tiredness (fatigue), or off-balance. Take over-the-counter and prescription medicines only as told by your health care provider. These include supplements. Eat a healthy diet and maintain a healthy weight. A healthy diet includes low-fat dairy products, low-fat (lean) meats, and fiber from whole grains, beans, and lots of fruits and vegetables. Stay current with your vaccines. Schedule regular health, dental, and eye exams. Summary Having a healthy lifestyle and getting preventive care can help to protect your health and wellness after age 25. Screening and testing are the best way to find a health problem early and help you avoid having a fall. Early diagnosis and treatment give you the best chance for managing medical conditions that are more common for people who are older than age 21. Falls are a major cause of broken bones and head injuries in people who are older than age 52. Take precautions to prevent a fall at home. Work with your health care provider to learn what changes you can make to improve your health and wellness and to prevent falls. This information is not intended to replace advice given to you by your health care provider. Make sure you discuss any questions you have with your health care provider. Document Revised: 12/29/2020 Document Reviewed: 12/29/2020 Elsevier Patient Education  2024 Elsevier Inc.     Edwina Barth, MD [facility]

## 2023-05-11 NOTE — Patient Instructions (Signed)
Health Maintenance After Age 76 After age 76, you are at a higher risk for certain long-term diseases and infections as well as injuries from falls. Falls are a major cause of broken bones and head injuries in people who are older than age 76. Getting regular preventive care can help to keep you healthy and well. Preventive care includes getting regular testing and making lifestyle changes as recommended by your health care provider. Talk with your health care provider about: Which screenings and tests you should have. A screening is a test that checks for a disease when you have no symptoms. A diet and exercise plan that is right for you. What should I know about screenings and tests to prevent falls? Screening and testing are the best ways to find a health problem early. Early diagnosis and treatment give you the best chance of managing medical conditions that are common after age 76. Certain conditions and lifestyle choices may make you more likely to have a fall. Your health care provider may recommend: Regular vision checks. Poor vision and conditions such as cataracts can make you more likely to have a fall. If you wear glasses, make sure to get your prescription updated if your vision changes. Medicine review. Work with your health care provider to regularly review all of the medicines you are taking, including over-the-counter medicines. Ask your health care provider about any side effects that may make you more likely to have a fall. Tell your health care provider if any medicines that you take make you feel dizzy or sleepy. Strength and balance checks. Your health care provider may recommend certain tests to check your strength and balance while standing, walking, or changing positions. Foot health exam. Foot pain and numbness, as well as not wearing proper footwear, can make you more likely to have a fall. Screenings, including: Osteoporosis screening. Osteoporosis is a condition that causes  the bones to get weaker and break more easily. Blood pressure screening. Blood pressure changes and medicines to control blood pressure can make you feel dizzy. Depression screening. You may be more likely to have a fall if you have a fear of falling, feel depressed, or feel unable to do activities that you used to do. Alcohol use screening. Using too much alcohol can affect your balance and may make you more likely to have a fall. Follow these instructions at home: Lifestyle Do not drink alcohol if: Your health care provider tells you not to drink. If you drink alcohol: Limit how much you have to: 0-1 drink a day for women. 0-2 drinks a day for men. Know how much alcohol is in your drink. In the [place] one drink equals one 12 oz bottle of beer (355 mL), one 5 oz glass of wine (148 mL), or one 1 oz glass of hard liquor (44 mL). Do not use any products that contain nicotine or tobacco. These products include cigarettes, chewing tobacco, and vaping devices, such as e-cigarettes. If you need help quitting, ask your health care provider. Activity  Follow a regular exercise program to stay fit. This will help you maintain your balance. Ask your health care provider what types of exercise are appropriate for you. If you need a cane or walker, use it as recommended by your health care provider. Wear supportive shoes that have nonskid soles. Safety  Remove any tripping hazards, such as rugs, cords, and clutter. Install safety equipment such as grab bars in bathrooms and safety rails on stairs. Keep rooms and walkways   well-lit. General instructions Talk with your health care provider about your risks for falling. Tell your health care provider if: You fall. Be sure to tell your health care provider about all falls, even ones that seem minor. You feel dizzy, tiredness (fatigue), or off-balance. Take over-the-counter and prescription medicines only as told by your health care provider. These include  supplements. Eat a healthy diet and maintain a healthy weight. A healthy diet includes low-fat dairy products, low-fat (lean) meats, and fiber from whole grains, beans, and lots of fruits and vegetables. Stay current with your vaccines. Schedule regular health, dental, and eye exams. Summary Having a healthy lifestyle and getting preventive care can help to protect your health and wellness after age 76. Screening and testing are the best way to find a health problem early and help you avoid having a fall. Early diagnosis and treatment give you the best chance for managing medical conditions that are more common for people who are older than age 76. Falls are a major cause of broken bones and head injuries in people who are older than age 76. Take precautions to prevent a fall at home. Work with your health care provider to learn what changes you can make to improve your health and wellness and to prevent falls. This information is not intended to replace advice given to you by your health care provider. Make sure you discuss any questions you have with your health care provider. Document Revised: 12/29/2020 Document Reviewed: 12/29/2020 Elsevier Patient Education  2024 Elsevier Inc.  

## 2023-05-11 NOTE — Assessment & Plan Note (Signed)
Clinically euthyroid Continue Synthroid 75 mcg daily Lab Results  Component Value Date   TSH 1.06 11/08/2022

## 2023-05-11 NOTE — Assessment & Plan Note (Signed)
Chronic stable condition Continue Plaquenil 200 mg twice a day.  Skipping doses on Saturday and Sunday as instructed.

## 2023-05-11 NOTE — Assessment & Plan Note (Signed)
BP Readings from Last 3 Encounters:  05/11/23 (!) 142/82  01/21/23 134/82  01/20/23 (!) 174/81  Elevated blood pressure reading in the office but normal at home Continue losartan 100 mg daily and metoprolol tartrate 25 mg twice a day Well-controlled diabetes Lab Results  Component Value Date   HGBA1C 6.2 11/08/2022  Continue metformin 500 mg twice a day Cardiovascular risks associated with hypertension and diabetes discussed Diet and nutrition discussed

## 2023-05-11 NOTE — Assessment & Plan Note (Signed)
Continues probenecid 500 mg 3 times a day

## 2023-05-11 NOTE — Assessment & Plan Note (Signed)
Chronic stable conditions Diet and nutrition discussed Continue rosuvastatin 10 mg daily

## 2023-05-13 ENCOUNTER — Encounter (HOSPITAL_COMMUNITY): Payer: Self-pay | Admitting: Gastroenterology

## 2023-05-13 ENCOUNTER — Other Ambulatory Visit: Payer: Self-pay

## 2023-05-13 NOTE — Progress Notes (Addendum)
PCP - Dr.  Edwina Barth , MD Cardiologist - no  PPM/ICD -  Device Orders -  Rep Notified -   Chest x-ray -  EKG -  Stress Test -  ECHO -  Cardiac Cath -   Sleep Study -  CPAP -   Fasting Blood Sugar -  Checks Blood Sugar _____ times a day  Blood Thinner Instructions: Aspirin Instructions:  ERAS Protcol - PRE-SURGERY Ensure or G2-   Pt. Has bowel prep  COVID vaccine -  Activity--Able to complete a flight of stairs without CP or SOB Anesthesia review: HTN, DM   metformin  Patient denies shortness of breath, fever, cough and chest pain at PAT appointment   All instructions explained to the patient, with a verbal understanding of the material. Patient agrees to go over the instructions while at home for a better understanding. Patient also instructed to self quarantine after being tested for COVID-19. The opportunity to ask questions was provided.

## 2023-05-19 ENCOUNTER — Ambulatory Visit (HOSPITAL_COMMUNITY)
Admission: RE | Admit: 2023-05-19 | Discharge: 2023-05-19 | Disposition: A | Discharge status: Home / Self Care | Payer: Medicare Other | Attending: Gastroenterology | Admitting: Gastroenterology

## 2023-05-19 ENCOUNTER — Ambulatory Visit (HOSPITAL_COMMUNITY): Payer: Medicare Other | Admitting: Anesthesiology

## 2023-05-19 ENCOUNTER — Other Ambulatory Visit: Payer: Self-pay

## 2023-05-19 ENCOUNTER — Encounter (HOSPITAL_COMMUNITY)
Admission: RE | Disposition: A | Discharge status: Home / Self Care | Payer: Self-pay | Source: Home / Self Care | Attending: Gastroenterology

## 2023-05-19 DIAGNOSIS — Z90710 Acquired absence of both cervix and uterus: Secondary | ICD-10-CM | POA: Diagnosis not present

## 2023-05-19 DIAGNOSIS — Z800 Family history of malignant neoplasm of digestive organs: Secondary | ICD-10-CM | POA: Insufficient documentation

## 2023-05-19 DIAGNOSIS — D124 Benign neoplasm of descending colon: Secondary | ICD-10-CM | POA: Diagnosis not present

## 2023-05-19 DIAGNOSIS — Z8249 Family history of ischemic heart disease and other diseases of the circulatory system: Secondary | ICD-10-CM | POA: Insufficient documentation

## 2023-05-19 DIAGNOSIS — D122 Benign neoplasm of ascending colon: Secondary | ICD-10-CM

## 2023-05-19 DIAGNOSIS — Z86010 Personal history of colonic polyps: Secondary | ICD-10-CM

## 2023-05-19 DIAGNOSIS — D123 Benign neoplasm of transverse colon: Secondary | ICD-10-CM | POA: Diagnosis not present

## 2023-05-19 DIAGNOSIS — E119 Type 2 diabetes mellitus without complications: Secondary | ICD-10-CM | POA: Diagnosis not present

## 2023-05-19 DIAGNOSIS — K635 Polyp of colon: Secondary | ICD-10-CM | POA: Diagnosis present

## 2023-05-19 DIAGNOSIS — I10 Essential (primary) hypertension: Secondary | ICD-10-CM | POA: Diagnosis not present

## 2023-05-19 DIAGNOSIS — K5730 Diverticulosis of large intestine without perforation or abscess without bleeding: Secondary | ICD-10-CM | POA: Diagnosis not present

## 2023-05-19 DIAGNOSIS — Z7984 Long term (current) use of oral hypoglycemic drugs: Secondary | ICD-10-CM | POA: Diagnosis not present

## 2023-05-19 DIAGNOSIS — Z7722 Contact with and (suspected) exposure to environmental tobacco smoke (acute) (chronic): Secondary | ICD-10-CM | POA: Diagnosis not present

## 2023-05-19 HISTORY — PX: POLYPECTOMY: SHX5525

## 2023-05-19 HISTORY — PX: ENDOSCOPIC MUCOSAL RESECTION: SHX6839

## 2023-05-19 HISTORY — PX: HEMOSTASIS CLIP PLACEMENT: SHX6857

## 2023-05-19 HISTORY — DX: Pneumonia, unspecified organism: J18.9

## 2023-05-19 HISTORY — PX: SUBMUCOSAL LIFTING INJECTION: SHX6855

## 2023-05-19 HISTORY — PX: COLONOSCOPY WITH PROPOFOL: SHX5780

## 2023-05-19 LAB — GLUCOSE, CAPILLARY: Glucose-Capillary: 96 mg/dL (ref 70–99)

## 2023-05-19 SURGERY — COLONOSCOPY WITH PROPOFOL
Anesthesia: Monitor Anesthesia Care

## 2023-05-19 MED ORDER — SODIUM CHLORIDE 0.9 % IV SOLN: INTRAVENOUS | Status: DC

## 2023-05-19 MED ORDER — PROPOFOL 10 MG/ML IV BOLUS
INTRAVENOUS | Status: DC | PRN
Start: 2023-05-19 — End: 2023-05-19
  Administered 2023-05-19 (×2): 20 mg via INTRAVENOUS
  Administered 2023-05-19: 30 mg via INTRAVENOUS
  Administered 2023-05-19 (×2): 20 mg via INTRAVENOUS
  Administered 2023-05-19: 10 mg via INTRAVENOUS
  Administered 2023-05-19: 20 mg via INTRAVENOUS

## 2023-05-19 MED ORDER — LACTATED RINGERS IV SOLN: INTRAVENOUS | Status: DC

## 2023-05-19 MED ORDER — EPHEDRINE SULFATE (PRESSORS) 50 MG/ML IJ SOLN
INTRAMUSCULAR | Status: DC | PRN
  Administered 2023-05-19 (×2): 5 mg via INTRAVENOUS

## 2023-05-19 MED ORDER — PROPOFOL 500 MG/50ML IV EMUL
INTRAVENOUS | Status: DC | PRN
  Administered 2023-05-19: 100 ug/kg/min via INTRAVENOUS

## 2023-05-19 MED ORDER — LIDOCAINE HCL (CARDIAC) PF 100 MG/5ML IV SOSY
PREFILLED_SYRINGE | INTRAVENOUS | Status: DC | PRN
Start: 2023-05-19 — End: 2023-05-19
  Administered 2023-05-19: 60 mg via INTRATRACHEAL

## 2023-05-19 MED ORDER — LACTATED RINGERS IV SOLN: INTRAVENOUS | Status: DC | PRN

## 2023-05-19 SURGICAL SUPPLY — 22 items

## 2023-05-19 NOTE — Anesthesia Postprocedure Evaluation (Signed)
Anesthesia Post Note  Patient: Isabella Dixon  Procedure(s) Performed: COLONOSCOPY WITH PROPOFOL POLYPECTOMY SUBMUCOSAL LIFTING INJECTION ENDOSCOPIC MUCOSAL RESECTION HEMOSTASIS CLIP PLACEMENT     Patient location during evaluation: PACU Anesthesia Type: MAC Level of consciousness: awake and alert Pain management: pain level controlled Vital Signs Assessment: post-procedure vital signs reviewed and stable Respiratory status: spontaneous breathing, nonlabored ventilation, respiratory function stable and patient connected to nasal cannula oxygen Cardiovascular status: stable and blood pressure returned to baseline Postop Assessment: no apparent nausea or vomiting Anesthetic complications: no  No notable events documented.  Last Vitals:  Vitals:   05/19/23 1010 05/19/23 1020  BP: 135/64 120/60  Pulse: 78 68  Resp: (!) 21 14  Temp:    SpO2: 97% 98%    Last Pain:  Vitals:   05/19/23 1020  TempSrc:   PainSc: 0-No pain                 Kennieth Rad

## 2023-05-19 NOTE — Op Note (Addendum)
[facility] Patient Name: Isabella Dixon Procedure Date: 05/19/2023 MRN: [id number] Attending MD: Dub Amis. Tomasa Rand , MD, [id number] Date of Birth: [birth year] CSN: [id number] Age: 77 Admit Type: Outpatient Procedure:                Colonoscopy Indications:              Therapeutic procedure for known colon polyp:                            Patient underwent screening colonoscopy in May 2024                            and was found to have 13 polyps (TAs) including a                            25 mm polyp in the transverse colon which was not                            removed and 2 small polyps (cecum, ascending colon                            which were not removed due to excessive                            looping/scope tension and inability to get polyps                            into position for polypectomy. Providers:                Dub Amis. Tomasa Rand, MD, Adin Hector, RN, Stephens Shire RN, RN, Rozetta Nunnery, Technician Referring MD:              Medicines:                Monitored Anesthesia Care Complications:            No immediate complications. Estimated Blood Loss:     Estimated blood loss was minimal. Procedure:                Pre-Anesthesia Assessment:                           - Prior to the procedure, a History and Physical                            was performed, and patient medications and                            allergies were reviewed. The patient's tolerance of                            previous anesthesia was also reviewed. The risks  and benefits of the procedure and the sedation                            options and risks were discussed with the patient.                            All questions were answered, and informed consent                            was obtained. Prior Anticoagulants: The patient has                            taken no anticoagulant or antiplatelet  agents. ASA                            Grade Assessment: II - A patient with mild systemic                            disease. After reviewing the risks and benefits,                            the patient was deemed in satisfactory condition to                            undergo the procedure.                           After obtaining informed consent, the colonoscope                            was passed under direct vision. Throughout the                            procedure, the patient's blood pressure, pulse, and                            oxygen saturations were monitored continuously. The                            CF-HQ190L ([id number]) Olympus colonoscope was                            introduced through the anus with the intention of                            advancing to the cecum. The scope was advanced to                            the ascending colon before the procedure was                            aborted. Medications were given. The colonoscopy  was unusually difficult due to restricted mobility                            of the colon, significant looping and a tortuous                            colon. Successful completion of the procedure was                            not possible despite water immersion, abdominal                            binder, changing the patient to a supine position,                            changing the patient to a prone position, using                            manual pressure and withdrawing and reinserting the                            scope multiple times. The patient tolerated the                            procedure fairly well but did experience retching                            multiple times. No concern for aspiration. The                            quality of the bowel preparation was good. The                            rectum was photographed. The bowel preparation used                             was SUPREP via split dose instruction. Scope In: 8:39:10 AM Scope Out: 9:52:31 AM Total Procedure Duration: 1 hour 13 minutes 20 seconds  Findings:      The perianal and digital rectal examinations were normal. Pertinent       negatives include normal sphincter tone and no palpable rectal lesions.      A 6 mm polyp was found in the transverse colon. The polyp was sessile.       The polyp was removed with a cold snare. Resection and retrieval were       complete. Estimated blood loss was minimal.      A 3 mm polyp was found in the descending colon. The polyp was sessile.       The polyp was removed with a cold snare. Resection and retrieval were       complete. Estimated blood loss was minimal.      A 25 mm polyp was found in the transverse colon. The polyp was sessile.       Although cecal intubation was not acheived, the large polyp was  felt to       be in a good position for resection, so the decision was made to remove       the polyp. The polyp was lifted with 7mL of Everlift. The polyp lifted       uniformly. Polypectomy was performed piecemeal with a combination of hot       snare and cold snare. There were some polyp fragments on the edge and in       the middle of the polypectomy site which were treated with hot forceps.       There was some persistent oozing at the polypectomy site which was       treated with one hemostatic clip (MR conditional). Clip manufacturer:       [employer]. There was no bleeding at the end of the procedure.      A few small-mouthed diverticula were found in the sigmoid colon.      The exam was otherwise normal throughout the examined colon.      The retroflexed view of the distal rectum and anal verge was normal and       showed no anal or rectal abnormalities. Impression:               - One 6 mm polyp in the transverse colon, removed                            with a cold snare. Resected and retrieved.                           - One 3 mm  polyp in the descending colon, removed                            with a cold snare. Resected and retrieved.                           - One 25 mm polyp in the transverse colon, removed                            via piecemeal hot EMR with adjunctive hot forceps.                            Resected and retrieved. Clip (MR conditional) was                            placed. Clip manufacturer: [employer].                           - The distal rectum and anal verge are normal on                            retroflexion view. Moderate Sedation:      Not Applicable - Patient had care per Anesthesia. Recommendation:           - Patient has a contact number available for                            emergencies. The signs  and symptoms of potential                            delayed complications were discussed with the                            patient. Return to normal activities tomorrow.                            Written discharge instructions were provided to the                            patient.                           - Resume previous diet.                           - Continue present medications.                           - Await pathology results.                           - Repeat colonoscopy in 6-9 months for reassessment                            of piecemeal EMR site and because the examination                            was incomplete.                           - Will refer patient to [facility] for repeat colonoscopy                            given significant technical difficulty and                            inability to intubate cecum.                           - There were two polyps seen on her colonoscopy in                            May 2024 in the cecum and ascending colon that were                            not able to be removed. These were not visualized                            on today's exam. These will need to be removed at                            her  next colonoscopy.                           -  Recommend genetic testing given family history of                            colon cancer and large number of adenomatous polyps. Procedure Code(s):        --- Professional ---                           [phone], 52, Colonoscopy, flexible; with removal of                            tumor(s), polyp(s), or other lesion(s) by snare                            technique                           45384, 59,52, Colonoscopy, flexible; with removal                            of tumor(s), polyp(s), or other lesion(s) by hot                            biopsy forceps Diagnosis Code(s):        --- Professional ---                           D12.4, Benign neoplasm of descending colon                           D12.3, Benign neoplasm of transverse colon (hepatic                            flexure or splenic flexure)                           K63.5, Polyp of colon CPT copyright 2022 American Medical Association. All rights reserved. The codes documented in this report are preliminary and upon coder review may  be revised to meet current compliance requirements. Carmilla Granville E. Tomasa Rand, MD 05/19/2023 10:15:54 AM This report has been signed electronically. Number of Addenda: 0

## 2023-05-19 NOTE — Discharge Instructions (Signed)
YOU HAD AN ENDOSCOPIC PROCEDURE TODAY: Refer to the procedure report and other information in the discharge instructions given to you for any specific questions about what was found during the examination. If this information does not answer your questions, please call [place] office at [phone] to clarify.   YOU SHOULD EXPECT: Some feelings of bloating in the abdomen. Passage of more gas than usual. Walking can help get rid of the air that was put into your GI tract during the procedure and reduce the bloating. If you had a lower endoscopy (such as a colonoscopy or flexible sigmoidoscopy) you may notice spotting of blood in your stool or on the toilet paper. Some abdominal soreness may be present for a day or two, also.  DIET: Your first meal following the procedure should be a light meal and then it is ok to progress to your normal diet. A half-sandwich or bowl of soup is an example of a good first meal. Heavy or fried foods are harder to digest and may make you feel nauseous or bloated. Drink plenty of fluids but you should avoid alcoholic beverages for 24 hours. If you had a esophageal dilation, please see attached instructions for diet.    ACTIVITY: Your care partner should take you home directly after the procedure. You should plan to take it easy, moving slowly for the rest of the day. You can resume normal activity the day after the procedure however YOU SHOULD NOT DRIVE, use power tools, machinery or perform tasks that involve climbing or major physical exertion for 24 hours (because of the sedation medicines used during the test).   SYMPTOMS TO REPORT IMMEDIATELY: A gastroenterologist can be reached at any hour. Please call [phone]  for any of the following symptoms:  Following lower endoscopy (colonoscopy, flexible sigmoidoscopy) Excessive amounts of blood in the stool  Significant tenderness, worsening of abdominal pains  Swelling of the abdomen that is new, acute  Fever of 100 or  higher  Following upper endoscopy (EGD, EUS, ERCP, esophageal dilation) Vomiting of blood or coffee ground material  New, significant abdominal pain  New, significant chest pain or pain under the shoulder blades  Painful or persistently difficult swallowing  New shortness of breath  Black, tarry-looking or red, bloody stools  FOLLOW UP:  If any biopsies were taken you will be contacted by phone or by letter within the next 1-3 weeks. Call [phone]  if you have not heard about the biopsies in 3 weeks.  Please also call with any specific questions about appointments or follow up tests.YOU HAD AN ENDOSCOPIC PROCEDURE TODAY: Refer to the procedure report and other information in the discharge instructions given to you for any specific questions about what was found during the examination. If this information does not answer your questions, please call [place] office at [phone] to clarify.   YOU SHOULD EXPECT: Some feelings of bloating in the abdomen. Passage of more gas than usual. Walking can help get rid of the air that was put into your GI tract during the procedure and reduce the bloating. If you had a lower endoscopy (such as a colonoscopy or flexible sigmoidoscopy) you may notice spotting of blood in your stool or on the toilet paper. Some abdominal soreness may be present for a day or two, also.  DIET: Your first meal following the procedure should be a light meal and then it is ok to progress to your normal diet. A half-sandwich or bowl of soup is an example of a  good first meal. Heavy or fried foods are harder to digest and may make you feel nauseous or bloated. Drink plenty of fluids but you should avoid alcoholic beverages for 24 hours. If you had a esophageal dilation, please see attached instructions for diet.    ACTIVITY: Your care partner should take you home directly after the procedure. You should plan to take it easy, moving slowly for the rest of the day. You can resume  normal activity the day after the procedure however YOU SHOULD NOT DRIVE, use power tools, machinery or perform tasks that involve climbing or major physical exertion for 24 hours (because of the sedation medicines used during the test).   SYMPTOMS TO REPORT IMMEDIATELY: A gastroenterologist can be reached at any hour. Please call [phone]  for any of the following symptoms:  Following lower endoscopy (colonoscopy, flexible sigmoidoscopy) Excessive amounts of blood in the stool  Significant tenderness, worsening of abdominal pains  Swelling of the abdomen that is new, acute  Fever of 100 or higher  Following upper endoscopy (EGD, EUS, ERCP, esophageal dilation) Vomiting of blood or coffee ground material  New, significant abdominal pain  New, significant chest pain or pain under the shoulder blades  Painful or persistently difficult swallowing  New shortness of breath  Black, tarry-looking or red, bloody stools  FOLLOW UP:  If any biopsies were taken you will be contacted by phone or by letter within the next 1-3 weeks. Call [phone]  if you have not heard about the biopsies in 3 weeks.  Please also call with any specific questions about appointments or follow up tests.

## 2023-05-19 NOTE — H&P (Signed)
[facility] History and Physical   Primary Care Physician:  Georgina Quint, MD   Reason for Procedure:   EMR of large polyp  Plan:    Colonoscopy with EMR of large polyp     HPI: Isabella Dixon is a 77 y.o. female undergoing colonoscopy to remove a 25 mm polyp found in the transverse colon in May of this year.  She had 12 other adenomas that were removed.  Her colonoscopy was technically difficult to a tortuous colon and significant looping.  Her sister was diagnosed with colon cancer in her 56s.     Past Medical History:  Diagnosis Date   Anemia    Arthritis    Cataract    beginning   Diabetes mellitus without complication (HCC)    Gout    High cholesterol    Hypertension    Osteoporosis    Thyroid disease    Walking pneumonia     Past Surgical History:  Procedure Laterality Date   ABDOMINAL HYSTERECTOMY     COLONOSCOPY     x 2   COLONOSCOPY  01/20/2023   TUBAL LIGATION     tumor in stomach removed      Prior to Admission medications   Medication Sig Start Date End Date Taking? Authorizing Provider  chlorpheniramine (CHLOR-TRIMETON) 4 MG tablet Take 4 mg by mouth daily.   Yes [provider]  Cholecalciferol (VITAMIN D-3 PO) Take 2,000 Units by mouth daily.   Yes [provider]  hydroxychloroquine (PLAQUENIL) 200 MG tablet TAKE 1 TABLET TWICE A DAY  MONDAY THROUGH FRIDAY ONLY.DO NOT TAKE ON SATURDAY ANDSUNDAY 03/21/23  Yes Gearldine Bienenstock, PA-C  levothyroxine (SYNTHROID) 75 MCG tablet Take 1 tablet (75 mcg total) by mouth daily. 05/10/22 05/19/23 Yes Georgina Quint, MD  losartan (COZAAR) 100 MG tablet TAKE 1 TABLET DAILY 06/21/22  Yes Georgina Quint, MD  metFORMIN (GLUCOPHAGE) 500 MG tablet Take 1 tablet (500 mg total) by mouth 2 (two) times daily with a meal. 05/10/22 05/19/23 Yes Sagardia, Eilleen Kempf, MD  metoprolol tartrate (LOPRESSOR) 25 MG tablet Take 1 tablet (25 mg total) by mouth 2 (two) times daily.  05/10/22 05/19/23 Yes Sagardia, Eilleen Kempf, MD  Multiple Vitamin (MULTIVITAMIN) tablet Take 1 tablet by mouth daily.   Yes [provider]  probenecid (BENEMID) 500 MG tablet TAKE 1 TABLET 3 TIMES A DAY 07/09/22  Yes Sagardia, Eilleen Kempf, MD  rosuvastatin (CRESTOR) 10 MG tablet TAKE 1 TABLET DAILY 06/21/22  Yes Sagardia, Eilleen Kempf, MD  vitamin C (ASCORBIC ACID) 250 MG tablet Take 250 mg by mouth daily.   Yes [provider]  Colchicine (MITIGARE) 0.6 MG CAPS as needed.    [provider]    Current Facility-Administered Medications  Medication Dose Route Frequency Provider Last Rate Last Admin   0.9 %  sodium chloride infusion   Intravenous Continuous Jenel Lucks, MD       lactated ringers infusion   Intravenous Continuous Jenel Lucks, MD       lactated ringers infusion   Intravenous Continuous Jenel Lucks, MD 10 mL/hr at 05/19/23 0727 New Bag at 05/19/23 0727    Allergies as of 03/01/2023   (No Known Allergies)    Family History  Problem Relation Age of Onset   Heart disease Mother    Stroke Mother    Hypertension Mother    Stroke Father    Colon cancer Sister  died earl 33's   ALS Brother    Heart disease Brother    Stroke Maternal Grandmother    Stroke Paternal Grandmother    Cancer Paternal Grandmother    Cancer Paternal Grandfather    SIDS Son    Testicular cancer Son    Down syndrome Son    Colon polyps Neg Hx    Crohn's disease Neg Hx    Esophageal cancer Neg Hx    Stomach cancer Neg Hx    Ulcerative colitis Neg Hx    Rectal cancer Neg Hx     Social History   Socioeconomic History   Marital status: Married    Spouse name: Not on file   Number of children: Not on file   Years of education: Not on file   Highest education level: Not on file  Occupational History   Not on file  Tobacco Use   Smoking status: Never    Passive exposure: Past   Smokeless tobacco: Never  Vaping Use   Vaping status:  Never Used  Substance and Sexual Activity   Alcohol use: No   Drug use: No   Sexual activity: Yes    Birth control/protection: None  Other Topics Concern   Not on file  Social History Narrative   Not on file   Social Determinants of Health   Financial Resource Strain: Low Risk  (08/03/2022)   Overall Financial Resource Strain (CARDIA)    Difficulty of Paying Living Expenses: Not hard at all  Food Insecurity: No Food Insecurity (08/03/2022)   Hunger Vital Sign    Worried About Running Out of Food in the Last Year: Never true    Ran Out of Food in the Last Year: Never true  Transportation Needs: No Transportation Needs (08/03/2022)   PRAPARE - Administrator, Civil Service (Medical): No    Lack of Transportation (Non-Medical): No  Physical Activity: Sufficiently Active (08/03/2022)   Exercise Vital Sign    Days of Exercise per Week: 5 days    Minutes of Exercise per Session: 30 min  Stress: No Stress Concern Present (08/03/2022)   [employer] of Occupational Health - Occupational Stress Questionnaire    Feeling of Stress : Not at all  Social Connections: Socially Integrated (08/03/2022)   Social Connection and Isolation Panel [NHANES]    Frequency of Communication with Friends and Family: More than three times a week    Frequency of Social Gatherings with Friends and Family: More than three times a week    Attends Religious Services: More than 4 times per year    Active Member of [employer] or Organizations: Yes    Attends Engineer, structural: More than 4 times per year    Marital Status: Married  Catering manager Violence: Not At Risk (08/03/2022)   Humiliation, Afraid, Rape, and Kick questionnaire    Fear of Current or Ex-Partner: No    Emotionally Abused: No    Physically Abused: No    Sexually Abused: No    Review of Systems:  All other review of systems negative except as mentioned in the HPI.  Physical Exam: Vital signs BP (!) 167/76    Pulse 73   Temp (!) 97.3 F (36.3 C) (Temporal)   Resp (!) 21   Ht 5\' 3"  (1.6 m)   Wt 86.2 kg   SpO2 99%   BMI 33.66 kg/m   General:   Alert,  Well-developed, well-nourished, pleasant and cooperative in NAD Airway:  Mallampati 2 Lungs:  Clear throughout to auscultation.   Heart:  Regular rate and rhythm; no murmurs, clicks, rubs,  or gallops. Abdomen:  Soft, nontender and nondistended. Normal bowel sounds.   Neuro/Psych:  Normal mood and affect. A and O x 3   Delesa Kawa E. Tomasa Rand, MD [facility]

## 2023-05-19 NOTE — Transfer of Care (Signed)
Immediate Anesthesia Transfer of Care Note  Patient: Isabella Dixon  Procedure(s) Performed: COLONOSCOPY WITH PROPOFOL POLYPECTOMY SUBMUCOSAL LIFTING INJECTION HOT HEMOSTASIS (ARGON PLASMA COAGULATION/BICAP)  Patient Location: PACU  Anesthesia Type:MAC  Level of Consciousness: awake and alert   Airway & Oxygen Therapy: Patient Spontanous Breathing and Patient connected to face mask oxygen  Post-op Assessment: Report given to RN  Post vital signs: Reviewed and stable  Last Vitals:  Vitals Value Taken Time  BP 143/74 05/19/23 1004  Temp 36.3 C 05/19/23 1004  Pulse 80 05/19/23 1008  Resp 19 05/19/23 1008  SpO2 96 % 05/19/23 1008  Vitals shown include unfiled device data.  Last Pain:  Vitals:   05/19/23 1004  TempSrc: Temporal  PainSc: 0-No pain         Complications: No notable events documented.

## 2023-05-19 NOTE — Anesthesia Preprocedure Evaluation (Signed)
Anesthesia Evaluation  Patient identified by MRN, date of birth, ID band Patient awake    Reviewed: Allergy & Precautions, NPO status , Patient's Chart, lab work & pertinent test results  Airway Mallampati: II  TM Distance: >3 FB Neck ROM: Full    Dental  (+) Dental Advisory Given   Pulmonary neg pulmonary ROS   breath sounds clear to auscultation       Cardiovascular hypertension, Pt. on medications  Rhythm:Regular Rate:Normal     Neuro/Psych negative neurological ROS     GI/Hepatic negative GI ROS, Neg liver ROS,,,  Endo/Other  diabetes, Type 2Hypothyroidism    Renal/GU negative Renal ROS     Musculoskeletal  (+) Arthritis ,    Abdominal   Peds  Hematology  (+) Blood dyscrasia, anemia   Anesthesia Other Findings   Reproductive/Obstetrics                             Anesthesia Physical Anesthesia Plan  ASA: 2  Anesthesia Plan: MAC   Post-op Pain Management:    Induction:   PONV Risk Score and Plan: 2 and Propofol infusion and Ondansetron  Airway Management Planned: Natural Airway and Simple Face Mask  Additional Equipment:   Intra-op Plan:   Post-operative Plan:   Informed Consent: I have reviewed the patients History and Physical, chart, labs and discussed the procedure including the risks, benefits and alternatives for the proposed anesthesia with the patient or authorized representative who has indicated his/her understanding and acceptance.       Plan Discussed with: CRNA  Anesthesia Plan Comments:        Anesthesia Quick Evaluation

## 2023-05-20 ENCOUNTER — Encounter: Payer: Self-pay | Admitting: Gastroenterology

## 2023-05-20 LAB — SURGICAL PATHOLOGY

## 2023-05-20 NOTE — Progress Notes (Signed)
Isabella Dixon, Can you please place a referral to [facility] gastroenterology for a repeat colonoscopy.  Unable to intubate cecum due to difficult anatomy.  A large transverse colon polyp was removed with EMR.  Request surveillance of this polypectomy site at the time of repeat colonoscopy. Recommended colonoscopy in 6 to 9 months.

## 2023-05-22 ENCOUNTER — Encounter (HOSPITAL_COMMUNITY): Payer: Self-pay | Admitting: Gastroenterology

## 2023-06-08 NOTE — Progress Notes (Unsigned)
Office Visit Note  Patient: Isabella Dixon             Date of Birth: 01/24/1946           MRN: [id number]             PCP: Georgina Quint, MD Referring: Georgina Quint, * Visit Date: 06/22/2023 Occupation: @GUAROCC @  Subjective:  Mediation monitoring   History of Present Illness: ROXANNE DECKELMAN is a 77 y.o. female with history of seropositive rheumatoid arthritis, gout, and osteoarthritis. Patient remains on Plaquenil 200 mg 1 tablet by mouth twice daily Monday to Friday. She is tolerating Plaquenil without any side effects and has not missed any doses recently.  She denies any recent rheumatoid arthritis flares.  Patient states that if she performs repetitive or overuse activities she will notice increased arthralgias and joint stiffness.  Patient states that about a month ago she was folding and hanging an excessive amount of laundry and had a flare involving her left hand and arm.  Patient states that she took a dose of meloxicam which resolved her symptoms.  She denies any recent gout flares.  She has not needed to take colchicine.  She has remained on probenecid as prescribed.    Activities of Daily Living:  Patient reports morning stiffness for a few minutes.   Patient Denies nocturnal pain.  Difficulty dressing/grooming: Denies Difficulty climbing stairs: Denies Difficulty getting out of chair: Denies Difficulty using hands for taps, buttons, cutlery, and/or writing: Denies  Review of Systems  Constitutional:  Negative for fatigue.  HENT:  Negative for mouth sores and mouth dryness.   Eyes:  Negative for dryness.  Respiratory:  Negative for shortness of breath.   Cardiovascular:  Negative for chest pain and palpitations.  Gastrointestinal:  Positive for diarrhea and nausea. Negative for blood in stool and constipation.  Endocrine: Negative for increased urination.  Genitourinary:  Negative for involuntary urination.  Musculoskeletal:  Positive for  joint swelling and morning stiffness. Negative for joint pain, gait problem, joint pain, myalgias, muscle weakness, muscle tenderness and myalgias.  Skin:  Negative for color change, rash, hair loss and sensitivity to sunlight.  Allergic/Immunologic: Negative for susceptible to infections.  Neurological:  Negative for dizziness and headaches.  Hematological:  Negative for swollen glands.  Psychiatric/Behavioral:  Negative for depressed mood and sleep disturbance. The patient is not nervous/anxious.     PMFS History:  Patient Active Problem List   Diagnosis Date Noted   Rheumatoid arthritis involving multiple sites with positive rheumatoid factor (HCC) 11/04/2021   Polyarthralgia 07/17/2021   Osteoporosis 05/05/2021   Primary osteoarthritis of both hands 06/21/2019   Primary osteoarthritis of both feet 06/21/2019   Primary osteoarthritis of both knees 06/21/2019   History of sciatica 09/06/2017   Obesity, diabetes, and hypertension syndrome (HCC) 09/06/2017   History of gout 09/06/2017   Dyslipidemia associated with type 2 diabetes mellitus (HCC) 05/13/2015   Hypothyroid 07/25/2012   Gouty arthritis 05/02/2012   Osteoarthritis 05/02/2012   Hypertension associated with diabetes (HCC) 05/02/2012   Hyperlipidemia 05/02/2012   Abnormal Papanicolaou smear of vagina 05/02/2012    Past Medical History:  Diagnosis Date   Anemia    Arthritis    Cataract    beginning   Diabetes mellitus without complication (HCC)    Gout    High cholesterol    Hypertension    Osteoporosis    Thyroid disease    Walking pneumonia     Family History  Problem Relation Age of Onset   Heart disease Mother    Stroke Mother    Hypertension Mother    Stroke Father    Colon cancer Sister        died earl 38's   ALS Brother    Heart disease Brother    Stroke Maternal Grandmother    Stroke Paternal Grandmother    Cancer Paternal Grandmother    Cancer Paternal Grandfather    SIDS Son    Testicular  cancer Son    Down syndrome Son    Colon polyps Neg Hx    Crohn's disease Neg Hx    Esophageal cancer Neg Hx    Stomach cancer Neg Hx    Ulcerative colitis Neg Hx    Rectal cancer Neg Hx    Past Surgical History:  Procedure Laterality Date   ABDOMINAL HYSTERECTOMY     COLONOSCOPY     x 2   COLONOSCOPY  01/20/2023   COLONOSCOPY WITH PROPOFOL N/A 05/19/2023   Procedure: COLONOSCOPY WITH PROPOFOL;  Surgeon: Jenel Lucks, MD;  Location: [facility];  Service: Gastroenterology;  Laterality: N/A;   ENDOSCOPIC MUCOSAL RESECTION  05/19/2023   Procedure: ENDOSCOPIC MUCOSAL RESECTION;  Surgeon: Jenel Lucks, MD;  Location: [facility];  Service: Gastroenterology;;   HEMOSTASIS CLIP PLACEMENT  05/19/2023   Procedure: HEMOSTASIS CLIP PLACEMENT;  Surgeon: Jenel Lucks, MD;  Location: [facility];  Service: Gastroenterology;;   POLYPECTOMY N/A 05/19/2023   Procedure: POLYPECTOMY;  Surgeon: Jenel Lucks, MD;  Location: [facility];  Service: Gastroenterology;  Laterality: N/A;   SUBMUCOSAL LIFTING INJECTION  05/19/2023   Procedure: SUBMUCOSAL LIFTING INJECTION;  Surgeon: Jenel Lucks, MD;  Location: [facility];  Service: Gastroenterology;;   TUBAL LIGATION     tumor in stomach removed     Social History   Social History Narrative   Not on file   Immunization History  Administered Date(s) Administered   Fluad Quad(high Dose 65+) 05/02/2019, 04/30/2020, 05/05/2021, 05/10/2022   Fluad Trivalent(High Dose 65+) 05/11/2023   Influenza Split 05/02/2012   Influenza, High Dose Seasonal PF 06/15/2018   Influenza,inj,Quad PF,6+ Mos 05/08/2013, 04/30/2014, 05/13/2015, 05/17/2016, 09/06/2017   PFIZER(Purple Top)SARS-COV-2 Vaccination 09/17/2019, 10/08/2019, 06/05/2020, 06/04/2021   Pneumococcal Conjugate-13 05/13/2015   Pneumococcal Polysaccharide-23 11/01/2018   Tdap 03/14/2008     Objective: Vital Signs: BP (!) 143/77 (BP Location: Left Arm, Patient Position:  Sitting, Cuff Size: Normal)   Pulse 72   Resp 16   Ht 5\' 3"  (1.6 m)   Wt 194 lb 3.2 oz (88.1 kg)   BMI 34.40 kg/m    Physical Exam Vitals and nursing note reviewed.  Constitutional:      Appearance: She is well-developed.  HENT:     Head: Normocephalic and atraumatic.  Eyes:     Conjunctiva/sclera: Conjunctivae normal.  Cardiovascular:     Rate and Rhythm: Normal rate and regular rhythm.     Heart sounds: Normal heart sounds.  Pulmonary:     Effort: Pulmonary effort is normal.     Breath sounds: Normal breath sounds.  Abdominal:     General: Bowel sounds are normal.     Palpations: Abdomen is soft.  Musculoskeletal:     Cervical back: Normal range of motion.  Lymphadenopathy:     Cervical: No cervical adenopathy.  Skin:    General: Skin is warm and dry.     Capillary Refill: Capillary refill takes less than 2 seconds.  Neurological:  Mental Status: She is alert and oriented to person, place, and time.  Psychiatric:        Behavior: Behavior normal.      Musculoskeletal Exam: C-spine has limited range of motion with lateral rotation.  Thoracic kyphosis noted.  Shoulder joints, elbow joints, and wrist joints have good range of motion.  CMC, PIP, DIP thickening consistent with osteoarthritis of both hands.  No synovitis over MCP joints.  Hip joints have good range of motion with no groin pain.  Knee joints have good range of motion no warmth or effusion.  Ankle joints have good range of motion no tenderness or joint swelling.  CDAI Exam: CDAI Score: -- Patient Global: 10 / 100; Provider Global: 10 / 100 Swollen: --; Tender: -- Joint Exam 06/22/2023   No joint exam has been documented for this visit   There is currently no information documented on the homunculus. Go to the Rheumatology activity and complete the homunculus joint exam.  Investigation: No additional findings.  Imaging: No results found.  Recent Labs: Lab Results  Component Value Date   WBC 7.1  11/08/2022   HGB 13.0 11/08/2022   PLT 264.0 11/08/2022   NA 138 11/08/2022   K 4.4 11/08/2022   CL 99 11/08/2022   CO2 28 11/08/2022   GLUCOSE 100 (H) 11/08/2022   BUN 20 11/08/2022   CREATININE 0.89 11/08/2022   BILITOT 0.4 11/08/2022   ALKPHOS 85 11/08/2022   AST 22 11/08/2022   ALT 19 11/08/2022   PROT 8.4 (H) 11/08/2022   ALBUMIN 4.1 11/08/2022   CALCIUM 10.3 11/08/2022   GFRAA 86 07/02/2020    Speciality Comments: PLQ Eye Exam 01/31/2023 WNL @ [facility] Follow up in 1 year PLQ 09/28/2021 Allopurinol- diarrhea  Procedures:  No procedures performed Allergies: Patient has no known allergies.    Assessment / Plan:     Visit Diagnoses: Rheumatoid arthritis involving multiple sites with positive rheumatoid factor (HCC) - RF+, responsive to prednisone use: She has no synovitis on examination today.  She is clinically doing well taking Plaquenil 200 mg 2 tablets alternating with 1 tablet every other day Monday through friday and 1 tablet Saturday and Sundays.  She is tolerating Plaquenil without any side effects and has not had any interruptions in therapy.  She experiences intermittent discomfort in both hands and both wrists with repetitive activities but has otherwise been doing well taking plaquenil as prescribed.  She will remain on Plaquenil as monotherapy.  She was advised to notify us if she develops signs or symptoms of a flare.  She will follow-up in the office in 5 months or sooner if needed.  High risk medication use - Plaquenil 200 mg 2 tablets alternating with 1 tablet every other day Monday through friday and 1 tablet Saturday and Sundays.  PLQ Eye Exam 01/31/2023 WNL @ [facility] Follow up in 1 year  CBC and CMP updated on 11/08/22. Orders for CBC and CMP released today.  - Plan: CBC with Differential/Platelet, COMPLETE METABOLIC PANEL WITH GFR  Primary osteoarthritis of both hands: She has PIP and DIP thickening consistent with  osteoarthritis of both hands.  CMC joint thickening noted bilaterally.  Some tenderness over the left CMC joint.  Ganglion cyst noted on the radial aspect of the left wrist.  Patient experiences intermittent pain and stiffness involving both hands especially after repetitive activities.  She had no synovitis on examination today.  Primary osteoarthritis of both knees: She has  good range of motion of both knee joints on examination today.  No warmth or effusion noted.  Primary osteoarthritis of both feet: She has good ROM of both ankle joints with no tenderness or joint swelling.  She is wearing proper fitting shoes.   Neck pain - X-rays in the past: multilevel spondylosis and facet joint arthropathy.Most severe narrowing was noted between C5-C6 and C6-C7 with anterior osteophytes. C-spine has limited ROM with lateral rotation.   Spondylosis of lumbar spine:     Gouty arthritis - She has not had any signs or symptoms of a gout flare.  She has clinically been doing well taking probenecid 500 mg 3 times daily.  She has not needed to take colchicine recently.  Uric acid: 4.4 on 07/27/2022.  Plan to recheck uric acid level today. She will notify us if she develop signs or symptoms of a gout flare.- Plan: Uric acid  Osteopenia of multiple sites: DEXA updated on 09/28/19: The BMD measured at Femur Neck Right is 0.718 g/cm2 with a T-score of -2.3.  Overdue to update DEXA--order remains in place.  She is taking vitamin D 2000 units daily.  Other medical conditions are listed as follows:  Essential hypertension: Blood pressure was elevated today in the office and was rechecked prior to leaving.  Patient was advised to monitor blood pressure closely and to reach out to PCP if it remains elevated.  History of hyperlipidemia  History of hypothyroidism  Type 2 diabetes mellitus without complication, without long-term current use of insulin (HCC)   Orders: Orders Placed This Encounter  Procedures   CBC  with Differential/Platelet   COMPLETE METABOLIC PANEL WITH GFR   Uric acid   Meds ordered this encounter  Medications   hydroxychloroquine (PLAQUENIL) 200 MG tablet    Sig: Take 2 tablets alternating with 1 tablet by mouth Monday through Friday and take 1 tablet on Saturday and Sunday.    Dispense:  120 tablet    Refill:  0     Follow-Up Instructions: Return in about 5 months (around 11/20/2023) for Rheumatoid arthritis, Gout, Osteoarthritis.   Gearldine Bienenstock, PA-C  Note - This record has been created using Dragon software.  Chart creation errors have been sought, but may not always  have been located. Such creation errors do not reflect on  the standard of medical care.

## 2023-06-15 ENCOUNTER — Other Ambulatory Visit: Payer: Self-pay | Admitting: Emergency Medicine

## 2023-06-15 DIAGNOSIS — E119 Type 2 diabetes mellitus without complications: Secondary | ICD-10-CM

## 2023-06-15 DIAGNOSIS — E785 Hyperlipidemia, unspecified: Secondary | ICD-10-CM

## 2023-06-15 DIAGNOSIS — I10 Essential (primary) hypertension: Secondary | ICD-10-CM

## 2023-06-15 DIAGNOSIS — E038 Other specified hypothyroidism: Secondary | ICD-10-CM

## 2023-06-22 ENCOUNTER — Ambulatory Visit: Payer: Medicare Other | Attending: Physician Assistant | Admitting: Physician Assistant

## 2023-06-22 ENCOUNTER — Encounter: Payer: Self-pay | Admitting: Physician Assistant

## 2023-06-22 VITALS — BP 143/77 | HR 72 | Resp 16 | Ht 63.0 in | Wt 194.2 lb

## 2023-06-22 DIAGNOSIS — Z79899 Other long term (current) drug therapy: Secondary | ICD-10-CM | POA: Diagnosis not present

## 2023-06-22 DIAGNOSIS — M47816 Spondylosis without myelopathy or radiculopathy, lumbar region: Secondary | ICD-10-CM

## 2023-06-22 DIAGNOSIS — M79671 Pain in right foot: Secondary | ICD-10-CM

## 2023-06-22 DIAGNOSIS — I10 Essential (primary) hypertension: Secondary | ICD-10-CM

## 2023-06-22 DIAGNOSIS — M170 Bilateral primary osteoarthritis of knee: Secondary | ICD-10-CM | POA: Diagnosis not present

## 2023-06-22 DIAGNOSIS — M19041 Primary osteoarthritis, right hand: Secondary | ICD-10-CM | POA: Diagnosis not present

## 2023-06-22 DIAGNOSIS — M0579 Rheumatoid arthritis with rheumatoid factor of multiple sites without organ or systems involvement: Secondary | ICD-10-CM | POA: Diagnosis not present

## 2023-06-22 DIAGNOSIS — M79641 Pain in right hand: Secondary | ICD-10-CM

## 2023-06-22 DIAGNOSIS — Z8639 Personal history of other endocrine, nutritional and metabolic disease: Secondary | ICD-10-CM

## 2023-06-22 DIAGNOSIS — M19071 Primary osteoarthritis, right ankle and foot: Secondary | ICD-10-CM

## 2023-06-22 DIAGNOSIS — M79642 Pain in left hand: Secondary | ICD-10-CM

## 2023-06-22 DIAGNOSIS — M19042 Primary osteoarthritis, left hand: Secondary | ICD-10-CM

## 2023-06-22 DIAGNOSIS — E119 Type 2 diabetes mellitus without complications: Secondary | ICD-10-CM

## 2023-06-22 DIAGNOSIS — M79672 Pain in left foot: Secondary | ICD-10-CM

## 2023-06-22 DIAGNOSIS — M109 Gout, unspecified: Secondary | ICD-10-CM

## 2023-06-22 DIAGNOSIS — M542 Cervicalgia: Secondary | ICD-10-CM

## 2023-06-22 DIAGNOSIS — M19072 Primary osteoarthritis, left ankle and foot: Secondary | ICD-10-CM

## 2023-06-22 DIAGNOSIS — M8589 Other specified disorders of bone density and structure, multiple sites: Secondary | ICD-10-CM

## 2023-06-22 MED ORDER — HYDROXYCHLOROQUINE SULFATE 200 MG PO TABS: ORAL_TABLET | ORAL | 0 refills | Status: DC

## 2023-06-23 NOTE — Progress Notes (Signed)
CBC WNL

## 2023-06-23 NOTE — Progress Notes (Signed)
Uric acid WNL.  Total protein remains elevated and continues to trend up.  Globulin is elevated.  Please see if SPEP can be added.

## 2023-06-24 LAB — COMPLETE METABOLIC PANEL WITH GFR
AG Ratio: 1.1 (calc) (ref 1.0–2.5)
ALT: 14 U/L (ref 6–29)
AST: 19 U/L (ref 10–35)
Albumin: 4.5 g/dL (ref 3.6–5.1)
Alkaline phosphatase (APISO): 83 U/L (ref 37–153)
BUN: 22 mg/dL (ref 7–25)
CO2: 25 mmol/L (ref 20–32)
Calcium: 10.1 mg/dL (ref 8.6–10.4)
Chloride: 102 mmol/L (ref 98–110)
Creat: 0.94 mg/dL (ref 0.60–1.00)
Globulin: 4 g/dL — ABNORMAL HIGH (ref 1.9–3.7)
Glucose, Bld: 86 mg/dL (ref 65–99)
Potassium: 4.5 mmol/L (ref 3.5–5.3)
Sodium: 139 mmol/L (ref 135–146)
Total Bilirubin: 0.3 mg/dL (ref 0.2–1.2)
Total Protein: 8.5 g/dL — ABNORMAL HIGH (ref 6.1–8.1)
eGFR: 63 mL/min/{1.73_m2} (ref >=60)

## 2023-06-24 LAB — CBC WITH DIFFERENTIAL/PLATELET
Absolute Lymphocytes: 3602 {cells}/uL (ref 850–3900)
Absolute Monocytes: 780 {cells}/uL (ref 200–950)
Basophils Absolute: 50 {cells}/uL (ref 0–200)
Basophils Relative: 0.6 %
Eosinophils Absolute: 398 {cells}/uL (ref 15–500)
Eosinophils Relative: 4.8 %
HCT: 40.1 % (ref 35.0–45.0)
Hemoglobin: 12.8 g/dL (ref 11.7–15.5)
MCH: 30 pg (ref 27.0–33.0)
MCHC: 31.9 g/dL — ABNORMAL LOW (ref 32.0–36.0)
MCV: 93.9 fL (ref 80.0–100.0)
MPV: 10.6 fL (ref 7.5–12.5)
Monocytes Relative: 9.4 %
Neutro Abs: 3469 {cells}/uL (ref 1500–7800)
Neutrophils Relative %: 41.8 %
Platelets: 242 10*3/uL (ref 140–400)
RBC: 4.27 10*6/uL (ref 3.80–5.10)
RDW: 14.6 % (ref 11.0–15.0)
Total Lymphocyte: 43.4 %
WBC: 8.3 10*3/uL (ref 3.8–10.8)

## 2023-06-24 LAB — URIC ACID: Uric Acid, Serum: 4.8 mg/dL (ref 2.5–7.0)

## 2023-06-24 LAB — PROTEIN ELECTROPHORESIS, SERUM
Albumin ELP: 4.5 g/dL (ref 3.8–4.8)
Alpha 1: 0.3 g/dL (ref 0.2–0.3)
Alpha 2: 0.9 g/dL (ref 0.5–0.9)
Beta 2: 0.7 g/dL — ABNORMAL HIGH (ref 0.2–0.5)
Beta Globulin: 0.6 g/dL (ref 0.4–0.6)
Gamma Globulin: 1.3 g/dL (ref 0.8–1.7)
Total Protein: 8.3 g/dL — ABNORMAL HIGH (ref 6.1–8.1)

## 2023-06-24 LAB — TEST AUTHORIZATION

## 2023-06-26 NOTE — Progress Notes (Signed)
Please add IFE.

## 2023-06-27 NOTE — Progress Notes (Signed)
Please have patient return for serum immunofixation--not an emergency but ok to return this week if possible?

## 2023-06-28 ENCOUNTER — Other Ambulatory Visit: Payer: Self-pay | Admitting: *Deleted

## 2023-06-28 DIAGNOSIS — M109 Gout, unspecified: Secondary | ICD-10-CM

## 2023-06-28 DIAGNOSIS — Z79899 Other long term (current) drug therapy: Secondary | ICD-10-CM

## 2023-06-28 DIAGNOSIS — M0579 Rheumatoid arthritis with rheumatoid factor of multiple sites without organ or systems involvement: Secondary | ICD-10-CM

## 2023-06-29 ENCOUNTER — Other Ambulatory Visit: Payer: Self-pay | Admitting: *Deleted

## 2023-06-29 DIAGNOSIS — M109 Gout, unspecified: Secondary | ICD-10-CM

## 2023-06-29 DIAGNOSIS — Z79899 Other long term (current) drug therapy: Secondary | ICD-10-CM

## 2023-06-29 DIAGNOSIS — M0579 Rheumatoid arthritis with rheumatoid factor of multiple sites without organ or systems involvement: Secondary | ICD-10-CM

## 2023-07-02 LAB — IFE INTERPRETATION

## 2023-07-03 NOTE — Progress Notes (Signed)
IFE normal.

## 2023-07-04 ENCOUNTER — Other Ambulatory Visit: Payer: Self-pay | Admitting: Emergency Medicine

## 2023-07-04 DIAGNOSIS — I10 Essential (primary) hypertension: Secondary | ICD-10-CM

## 2023-07-13 ENCOUNTER — Other Ambulatory Visit: Payer: Self-pay | Admitting: Physician Assistant

## 2023-10-21 ENCOUNTER — Ambulatory Visit: Payer: Medicare Other

## 2023-10-21 DIAGNOSIS — E119 Type 2 diabetes mellitus without complications: Secondary | ICD-10-CM

## 2023-10-21 NOTE — Patient Instructions (Signed)
 Isabella Dixon , Thank you for taking time to come for your Medicare Wellness Visit. I appreciate your ongoing commitment to your health goals. Please review the following plan we discussed and let me know if I can assist you in the future.   Referrals/Orders/Follow-Ups/Clinician Recommendations: It was nice talking to you today.    This is a list of the screening recommended for you and due dates:  Health Maintenance  Topic Date Due   Yearly kidney health urinalysis for diabetes  Never done   Zoster (Shingles) Vaccine (1 of 2) Never done   Complete foot exam   10/30/2020   COVID-19 Vaccine (5 - 2024-25 season) 04/24/2023   Hemoglobin A1C  05/11/2023   Medicare Annual Wellness Visit  08/04/2023   Eye exam for diabetics  01/31/2024   Yearly kidney function blood test for diabetes  06/21/2024   Pneumonia Vaccine  Completed   Flu Shot  Completed   DEXA scan (bone density measurement)  Completed   Hepatitis C Screening  Completed   HPV Vaccine  Aged Out   DTaP/Tdap/Td vaccine  Discontinued   Colon Cancer Screening  Discontinued    Advanced directives: (Declined) Advance directive discussed with you today. Even though you declined this today, please call our office should you change your mind, and we can give you the proper paperwork for you to fill out.  Next Medicare Annual Wellness Visit scheduled for next year: Yes

## 2023-10-21 NOTE — Progress Notes (Signed)
 Subjective:   Isabella Dixon is a 78 y.o. who presents for a Medicare Wellness preventive visit.  Visit Complete: Virtual I connected with  Daleen Squibb on 10/21/23 by a audio enabled telemedicine application and verified that I am speaking with the correct person using two identifiers.  Patient Location: Home  Provider Location: Home Office  I discussed the limitations of evaluation and management by telemedicine. The patient expressed understanding and agreed to proceed.  Vital Signs: Because this visit was a virtual/telehealth visit, some criteria may be missing or patient reported. Any vitals not documented were not able to be obtained and vitals that have been documented are patient reported.  VideoDeclined- This patient declined Librarian, academic. Therefore the visit was completed with audio only.  AWV Questionnaire: No: Patient Medicare AWV questionnaire was not completed prior to this visit.  Cardiac Risk Factors include: advanced age (>61men, >34 women);hypertension;dyslipidemia;diabetes mellitus     Objective:    Today's Vitals   10/21/23 1429  Weight: 189 lb (85.7 kg)  Height: 5\' 3"  (1.6 m)   Body mass index is 33.48 kg/m.     10/21/2023    2:36 PM 05/19/2023    7:11 AM 05/13/2023   11:48 AM 05/13/2023   11:46 AM 08/03/2022    9:07 AM 07/08/2021   10:00 PM 09/28/2017    2:25 PM  Advanced Directives  Does Patient Have a Medical Advance Directive? No No No No No No No  Would patient like information on creating a medical advance directive?  Yes (MAU/Ambulatory/Procedural Areas - Information given)   No - Patient declined No - Patient declined Yes (MAU/Ambulatory/Procedural Areas - Information given)    Current Medications (verified) Outpatient Encounter Medications as of 10/21/2023  Medication Sig   chlorpheniramine (CHLOR-TRIMETON) 4 MG tablet Take 4 mg by mouth daily.   Cholecalciferol (VITAMIN D-3 PO) Take 2,000 Units  by mouth daily.   Colchicine (MITIGARE) 0.6 MG CAPS as needed.   hydroxychloroquine (PLAQUENIL) 200 MG tablet Take 2 tablets alternating with 1 tablet by mouth Monday through Friday and take 1 tablet on Saturday and Sunday.   losartan (COZAAR) 100 MG tablet TAKE 1 TABLET DAILY   metFORMIN (GLUCOPHAGE) 500 MG tablet TAKE 1 TABLET TWICE DAILY  WITH MEALS   metoprolol tartrate (LOPRESSOR) 25 MG tablet TAKE 1 TABLET TWICE A DAY   Multiple Vitamin (MULTIVITAMIN) tablet Take 1 tablet by mouth daily.   probenecid (BENEMID) 500 MG tablet TAKE 1 TABLET 3 TIMES A DAY   rosuvastatin (CRESTOR) 10 MG tablet TAKE 1 TABLET DAILY   SYNTHROID 75 MCG tablet TAKE 1 TABLET DAILY   vitamin C (ASCORBIC ACID) 250 MG tablet Take 250 mg by mouth daily.   No facility-administered encounter medications on file as of 10/21/2023.    Allergies (verified) Patient has no known allergies.   History: Past Medical History:  Diagnosis Date   Anemia    Arthritis    Cataract    beginning   Diabetes mellitus without complication (HCC)    Gout    High cholesterol    Hypertension    Osteoporosis    Thyroid disease    Walking pneumonia    Past Surgical History:  Procedure Laterality Date   ABDOMINAL HYSTERECTOMY     COLONOSCOPY     x 2   COLONOSCOPY  01/20/2023   COLONOSCOPY WITH PROPOFOL N/A 05/19/2023   Procedure: COLONOSCOPY WITH PROPOFOL;  Surgeon: Jenel Lucks, MD;  Location: [facility];  Service: Gastroenterology;  Laterality: N/A;   ENDOSCOPIC MUCOSAL RESECTION  05/19/2023   Procedure: ENDOSCOPIC MUCOSAL RESECTION;  Surgeon: Jenel Lucks, MD;  Location: [facility];  Service: Gastroenterology;;   HEMOSTASIS CLIP PLACEMENT  05/19/2023   Procedure: HEMOSTASIS CLIP PLACEMENT;  Surgeon: Jenel Lucks, MD;  Location: [facility];  Service: Gastroenterology;;   POLYPECTOMY N/A 05/19/2023   Procedure: POLYPECTOMY;  Surgeon: Jenel Lucks, MD;  Location: [facility];  Service:  Gastroenterology;  Laterality: N/A;   SUBMUCOSAL LIFTING INJECTION  05/19/2023   Procedure: SUBMUCOSAL LIFTING INJECTION;  Surgeon: Jenel Lucks, MD;  Location: [facility];  Service: Gastroenterology;;   TUBAL LIGATION     tumor in stomach removed     Family History  Problem Relation Age of Onset   Heart disease Mother    Stroke Mother    Hypertension Mother    Stroke Father    Colon cancer Sister        died earl 58's   ALS Brother    Heart disease Brother    Stroke Maternal Grandmother    Stroke Paternal Grandmother    Cancer Paternal Grandmother    Cancer Paternal Grandfather    SIDS Son    Testicular cancer Son    Down syndrome Son    Colon polyps Neg Hx    Crohn's disease Neg Hx    Esophageal cancer Neg Hx    Stomach cancer Neg Hx    Ulcerative colitis Neg Hx    Rectal cancer Neg Hx    Social History   Socioeconomic History   Marital status: Married    Spouse name: Richard   Number of children: 2   Years of education: Not on file   Highest education level: Not on file  Occupational History   Occupation: RETIRED  Tobacco Use   Smoking status: Never    Passive exposure: Past   Smokeless tobacco: Never  Vaping Use   Vaping status: Never Used  Substance and Sexual Activity   Alcohol use: No   Drug use: No   Sexual activity: Yes    Birth control/protection: None  Other Topics Concern   Not on file  Social History Narrative   Lives at home with husband and son   Social Drivers of Corporate investment banker Strain: Low Risk  (10/21/2023)   Overall Financial Resource Strain (CARDIA)    Difficulty of Paying Living Expenses: Not hard at all  Food Insecurity: No Food Insecurity (10/21/2023)   Hunger Vital Sign    Worried About Running Out of Food in the Last Year: Never true    Ran Out of Food in the Last Year: Never true  Transportation Needs: No Transportation Needs (10/21/2023)   PRAPARE - Transportation    Lack of Transportation (Medical): No     Lack of Transportation (Non-Medical): No  Physical Activity: Inactive (10/21/2023)   Exercise Vital Sign    Days of Exercise per Week: 0 days    Minutes of Exercise per Session: 0 min  Stress: No Stress Concern Present (10/21/2023)   [employer] of Occupational Health - Occupational Stress Questionnaire    Feeling of Stress : Not at all  Social Connections: Moderately Isolated (10/21/2023)   Social Connection and Isolation Panel [NHANES]    Frequency of Communication with Friends and Family: Three times a week    Frequency of Social Gatherings with Friends and Family: Once a week    Attends Religious Services: Never  Active Member of Clubs or Organizations: No    Attends Banker Meetings: Never    Marital Status: Married    Tobacco Counseling Counseling given: Not Answered    Clinical Intake:  Pre-visit preparation completed: Yes  Pain : No/denies pain     BMI - recorded: 33.48 Nutritional Status: BMI > 30  Obese Nutritional Risks: None Diabetes: Yes CBG done?: Yes (yesterday-per pt-139) CBG resulted in Enter/ Edit results?: No Did pt. bring in CBG monitor from home?: No  How often do you need to have someone help you when you read instructions, pamphlets, or other written materials from your doctor or pharmacy?: 1 - Never     Information entered by :: Vedant Shehadeh, RMA   Activities of Daily Living     10/21/2023    2:30 PM  In your present state of health, do you have any difficulty performing the following activities:  Hearing? 0  Vision? 0  Difficulty concentrating or making decisions? 0  Walking or climbing stairs? 0  Dressing or bathing? 0  Doing errands, shopping? 0  Preparing Food and eating ? N  Using the Toilet? N  In the past six months, have you accidently leaked urine? N  Do you have problems with loss of bowel control? N  Managing your Medications? N  Managing your Finances? N  Housekeeping or managing your Housekeeping? N     Patient Care Team: Georgina Quint, MD as PCP - General (Internal Medicine)  Indicate any recent Medical Services you may have received from other than [facility] providers in the past year (date may be approximate).     Assessment:   This is a routine wellness examination for [place].  Hearing/Vision screen Hearing Screening - Comments:: Denies hearing difficulties   Vision Screening - Comments:: Wears eyeglasses   Goals Addressed   None    Depression Screen     10/21/2023    2:42 PM 05/11/2023    9:48 AM 11/08/2022    9:58 AM 08/03/2022    9:11 AM 05/10/2022    8:38 AM 08/25/2021    4:21 PM 05/05/2021    8:18 AM  PHQ 2/9 Scores  PHQ - 2 Score 0 0 1 0 0 0 0  PHQ- 9 Score 0          Fall Risk     10/21/2023    2:36 PM 05/11/2023    9:48 AM 11/08/2022    9:58 AM 08/03/2022    9:08 AM 05/10/2022    8:38 AM  Fall Risk   Falls in the past year? 0 0 0 0 0  Number falls in past yr: 0 0 0 0 0  Injury with Fall? 0 0 0 0 0  Risk for fall due to : No Fall Risks No Fall Risks No Fall Risks No Fall Risks No Fall Risks  Follow up Falls prevention discussed;Falls evaluation completed Falls evaluation completed Falls evaluation completed Falls prevention discussed Falls evaluation completed    MEDICARE RISK AT HOME:  Medicare Risk at Home Any stairs in or around the home?: Yes (outside front and back deck) If so, are there any without handrails?: Yes Home free of loose throw rugs in walkways, pet beds, electrical cords, etc?: Yes Adequate lighting in your home to reduce risk of falls?: Yes Life alert?: No Use of a cane, walker or w/c?: No Grab bars in the bathroom?: No Shower chair or bench in shower?: Yes Elevated toilet seat or a  handicapped toilet?: Yes  TIMED UP AND GO:  Was the test performed?  No  Cognitive Function: 6CIT completed        10/21/2023    2:36 PM 08/03/2022    9:11 AM  6CIT Screen  What Year? 0 points 0 points  What month? 0 points 0 points   What time? 0 points 0 points  Count back from 20 0 points 0 points  Months in reverse 0 points 0 points  Repeat phrase 0 points 0 points  Total Score 0 points 0 points    Immunizations Immunization History  Administered Date(s) Administered   Fluad Quad(high Dose 65+) 05/02/2019, 04/30/2020, 05/05/2021, 05/10/2022   Fluad Trivalent(High Dose 65+) 05/11/2023   Influenza Split 05/02/2012   Influenza, High Dose Seasonal PF 06/15/2018   Influenza,inj,Quad PF,6+ Mos 05/08/2013, 04/30/2014, 05/13/2015, 05/17/2016, 09/06/2017   PFIZER(Purple Top)SARS-COV-2 Vaccination 09/17/2019, 10/08/2019, 06/05/2020, 06/04/2021   Pneumococcal Conjugate-13 05/13/2015   Pneumococcal Polysaccharide-23 11/01/2018   Tdap 03/14/2008    Screening Tests Health Maintenance  Topic Date Due   Diabetic kidney evaluation - Urine ACR  Never done   Zoster Vaccines- Shingrix (1 of 2) Never done   FOOT EXAM  10/30/2020   COVID-19 Vaccine (5 - 2024-25 season) 04/24/2023   HEMOGLOBIN A1C  05/11/2023   Medicare Annual Wellness (AWV)  08/04/2023   OPHTHALMOLOGY EXAM  01/31/2024   Diabetic kidney evaluation - eGFR measurement  06/21/2024   Pneumonia Vaccine 56+ Years old  Completed   INFLUENZA VACCINE  Completed   DEXA SCAN  Completed   Hepatitis C Screening  Completed   HPV VACCINES  Aged Out   DTaP/Tdap/Td  Discontinued   Colonoscopy  Discontinued    Health Maintenance  Health Maintenance Due  Topic Date Due   Diabetic kidney evaluation - Urine ACR  Never done   Zoster Vaccines- Shingrix (1 of 2) Never done   FOOT EXAM  10/30/2020   COVID-19 Vaccine (5 - 2024-25 season) 04/24/2023   HEMOGLOBIN A1C  05/11/2023   Medicare Annual Wellness (AWV)  08/04/2023   Health Maintenance Items Addressed: Diabetic Foot Exam scheduled, A1C, UACR (Urine Albumin:Creatinine Ratio), See Nurse Notes  Additional Screening:  Vision Screening: Recommended annual ophthalmology exams for early detection of glaucoma and  other disorders of the eye.  Dental Screening: Recommended annual dental exams for proper oral hygiene  Community Resource Referral / Chronic Care Management: CRR required this visit?  No   CCM required this visit?  No     Plan:     I have personally reviewed and noted the following in the patient's chart:   Medical and social history Use of alcohol, tobacco or illicit drugs  Current medications and supplements including opioid prescriptions. Patient is not currently taking opioid prescriptions. Functional ability and status Nutritional status Physical activity Advanced directives List of other physicians Hospitalizations, surgeries, and ER visits in previous 12 months Vitals Screenings to include cognitive, depression, and falls Referrals and appointments  In addition, I have reviewed and discussed with patient certain preventive protocols, quality metrics, and best practice recommendations. A written personalized care plan for preventive services as well as general preventive health recommendations were provided to patient.     Josilyn Shippee L Zacaria Pousson, CMA   10/21/2023   After Visit Summary: (Mail) Due to this being a telephonic visit, the after visit summary with patients personalized plan was offered to patient via mail   Notes: Please refer to Routing Comments.

## 2023-10-22 HISTORY — PX: COLONOSCOPY: SHX174

## 2023-10-24 ENCOUNTER — Other Ambulatory Visit: Payer: Self-pay | Admitting: Physician Assistant

## 2023-10-24 MED ORDER — COLCHICINE 0.6 MG PO TABS: 0.6000 mg | ORAL_TABLET | Freq: Two times a day (BID) | ORAL | 0 refills | Status: AC

## 2023-10-24 NOTE — Telephone Encounter (Signed)
 Patient contacted the office stating she has been having a couple of gout flares recently and needs a refill on her Colchicine.   Last Fill: 06/22/2023 (PLQ), 07/20/2021 (Colchicine)  Eye exam: 01/31/2023 WNL    Labs: 06/22/2023 CBC WNL Uric acid WNL. Total protein remains elevated and continues to trend up.  Globulin is elevated.  Next Visit: 12/06/2023  Last Visit: 06/22/2023  DX: Rheumatoid arthritis involving multiple sites with positive rheumatoid factor, Gouty arthritis   Current Dose per office note 06/22/2023: Plaquenil 200 mg 2 tablets alternating with 1 tablet every other day Monday through friday and 1 tablet Saturday and Sundays.   Okay to refill Plaquenil?

## 2023-11-09 ENCOUNTER — Ambulatory Visit: Payer: Medicare Other | Admitting: Emergency Medicine

## 2023-11-09 ENCOUNTER — Encounter: Payer: Self-pay | Admitting: Emergency Medicine

## 2023-11-09 VITALS — BP 132/80 | HR 71 | Temp 98.4°F | Ht 63.0 in | Wt 191.0 lb

## 2023-11-09 DIAGNOSIS — E1159 Type 2 diabetes mellitus with other circulatory complications: Secondary | ICD-10-CM | POA: Diagnosis not present

## 2023-11-09 DIAGNOSIS — E039 Hypothyroidism, unspecified: Secondary | ICD-10-CM

## 2023-11-09 DIAGNOSIS — E1169 Type 2 diabetes mellitus with other specified complication: Secondary | ICD-10-CM

## 2023-11-09 DIAGNOSIS — M0579 Rheumatoid arthritis with rheumatoid factor of multiple sites without organ or systems involvement: Secondary | ICD-10-CM

## 2023-11-09 DIAGNOSIS — E785 Hyperlipidemia, unspecified: Secondary | ICD-10-CM

## 2023-11-09 DIAGNOSIS — I152 Hypertension secondary to endocrine disorders: Secondary | ICD-10-CM | POA: Diagnosis not present

## 2023-11-09 DIAGNOSIS — G459 Transient cerebral ischemic attack, unspecified: Secondary | ICD-10-CM | POA: Insufficient documentation

## 2023-11-09 DIAGNOSIS — E119 Type 2 diabetes mellitus without complications: Secondary | ICD-10-CM

## 2023-11-09 DIAGNOSIS — Z7984 Long term (current) use of oral hypoglycemic drugs: Secondary | ICD-10-CM

## 2023-11-09 DIAGNOSIS — Z8739 Personal history of other diseases of the musculoskeletal system and connective tissue: Secondary | ICD-10-CM

## 2023-11-09 LAB — LIPID PANEL
Cholesterol: 128 mg/dL (ref 0–200)
HDL: 44.3 mg/dL (ref >39.00)
LDL Cholesterol: 38 mg/dL (ref 0–99)
NonHDL: 83.3
Total CHOL/HDL Ratio: 3
Triglycerides: 227 mg/dL — ABNORMAL HIGH (ref 0.0–149.0)
VLDL: 45.4 mg/dL — ABNORMAL HIGH (ref 0.0–40.0)

## 2023-11-09 LAB — CBC WITH DIFFERENTIAL/PLATELET
Basophils Absolute: 0 10*3/uL (ref 0.0–0.1)
Basophils Relative: 0.6 % (ref 0.0–3.0)
Eosinophils Absolute: 0.3 10*3/uL (ref 0.0–0.7)
Eosinophils Relative: 4.3 % (ref 0.0–5.0)
HCT: 40.2 % (ref 36.0–46.0)
Hemoglobin: 13.5 g/dL (ref 12.0–15.0)
Lymphocytes Relative: 37.9 % (ref 12.0–46.0)
Lymphs Abs: 2.6 10*3/uL (ref 0.7–4.0)
MCHC: 33.7 g/dL (ref 30.0–36.0)
MCV: 91.4 fl (ref 78.0–100.0)
Monocytes Absolute: 0.5 10*3/uL (ref 0.1–1.0)
Monocytes Relative: 7.8 % (ref 3.0–12.0)
Neutro Abs: 3.4 10*3/uL (ref 1.4–7.7)
Neutrophils Relative %: 49.4 % (ref 43.0–77.0)
Platelets: 249 10*3/uL (ref 150.0–400.0)
RBC: 4.4 Mil/uL (ref 3.87–5.11)
RDW: 14.2 % (ref 11.5–15.5)
WBC: 6.9 10*3/uL (ref 4.0–10.5)

## 2023-11-09 LAB — COMPREHENSIVE METABOLIC PANEL
ALT: 15 U/L (ref 0–35)
AST: 22 U/L (ref 0–37)
Albumin: 4.6 g/dL (ref 3.5–5.2)
Alkaline Phosphatase: 80 U/L (ref 39–117)
BUN: 16 mg/dL (ref 6–23)
CO2: 26 meq/L (ref 19–32)
Calcium: 10.3 mg/dL (ref 8.4–10.5)
Chloride: 101 meq/L (ref 96–112)
Creatinine, Ser: 0.81 mg/dL (ref 0.40–1.20)
GFR: 70.35 mL/min (ref >60.00)
Glucose, Bld: 99 mg/dL (ref 70–99)
Potassium: 3.9 meq/L (ref 3.5–5.1)
Sodium: 139 meq/L (ref 135–145)
Total Bilirubin: 0.5 mg/dL (ref 0.2–1.2)
Total Protein: 9 g/dL — ABNORMAL HIGH (ref 6.0–8.3)

## 2023-11-09 LAB — MICROALBUMIN / CREATININE URINE RATIO
Creatinine,U: 128.3 mg/dL
Microalb Creat Ratio: 5.8 mg/g (ref 0.0–30.0)
Microalb, Ur: 0.7 mg/dL (ref 0.0–1.9)

## 2023-11-09 LAB — HEMOGLOBIN A1C: Hgb A1c MFr Bld: 5.9 % (ref 4.6–6.5)

## 2023-11-09 LAB — POCT GLYCOSYLATED HEMOGLOBIN (HGB A1C): Hemoglobin A1C: 5.4 % (ref 4.0–5.6)

## 2023-11-09 NOTE — Assessment & Plan Note (Signed)
 BP Readings from Last 3 Encounters:  11/09/23 132/80  06/22/23 (!) 143/77  05/19/23 120/60  Well-controlled hypertension Continue losartan 100 mg daily and metoprolol tartrate 25 mg twice a day Well-controlled diabetes with hemoglobin A1c of 5.4 Continue metformin 500 mg twice a day Cardiovascular risks associated with hypertension and diabetes discussed Diet and nutrition discussed

## 2023-11-09 NOTE — Assessment & Plan Note (Signed)
Chronic stable conditions Diet and nutrition discussed Continue rosuvastatin 10 mg daily

## 2023-11-09 NOTE — Assessment & Plan Note (Signed)
Clinically euthyroid.  Continue Synthroid 75 mcg daily. 

## 2023-11-09 NOTE — Assessment & Plan Note (Signed)
Continues probenecid 500 mg 3 times a day

## 2023-11-09 NOTE — Assessment & Plan Note (Signed)
 Symptoms 3 days ago very concerning for TIA Normal neurologic examination today Advised to start daily baby aspirin Needs urgent neurological evaluation Referral placed today ED precautions given

## 2023-11-09 NOTE — Progress Notes (Signed)
 Daleen Squibb 78 y.o.   Chief Complaint  Patient presents with   Follow-up    6 month f/u  for HTN / DM. Patient mentions she wants to make sure she is still okay to do her colonoscopy on 11/16/23. She had the FLU 2/18-2/28 still with a cough and having yellow mucus. She doesn't want to drive all the way to [place] to be rescheduled.     HISTORY OF PRESENT ILLNESS: This is a 78 y.o. female here for 69-month follow-up of chronic medical conditions including diabetes Had flu last month.  Scheduled for colonoscopy next week Flu symptoms much improved although has occasional coughing spells. Also concerned about episode that happened last Sunday and lasted about 30 minutes during which she lost her speech and had numbness to right arm. Lab Results  Component Value Date   HGBA1C 5.4 11/09/2023   Wt Readings from Last 3 Encounters:  11/09/23 191 lb (86.6 kg)  10/21/23 189 lb (85.7 kg)  06/22/23 194 lb 3.2 oz (88.1 kg)   BP Readings from Last 3 Encounters:  11/09/23 132/80  06/22/23 (!) 143/77  05/19/23 120/60     HPI   Prior to Admission medications   Medication Sig Start Date End Date Taking? Authorizing Provider  chlorpheniramine (CHLOR-TRIMETON) 4 MG tablet Take 4 mg by mouth daily.   Yes [provider]  Cholecalciferol (VITAMIN D-3 PO) Take 2,000 Units by mouth daily.   Yes [provider]  colchicine 0.6 MG tablet Take 1 tablet (0.6 mg total) by mouth 2 (two) times daily. 10/24/23  Yes Deveshwar, Janalyn Rouse, MD  hydroxychloroquine (PLAQUENIL) 200 MG tablet TAKE 2 TABLETS ALTERNATING WITH 1 TABLET MONDAY THROUGH FRIDAY AND 1 TABLET ON SATURDAY AND SUNDAY AS DIRECTED 10/24/23  Yes Deveshwar, Janalyn Rouse, MD  losartan (COZAAR) 100 MG tablet TAKE 1 TABLET DAILY 06/15/23  Yes Nayquan Evinger, Eilleen Kempf, MD  metFORMIN (GLUCOPHAGE) 500 MG tablet TAKE 1 TABLET TWICE DAILY  WITH MEALS 06/15/23  Yes Danitra Payano, Eilleen Kempf, MD  metoprolol tartrate (LOPRESSOR) 25 MG tablet TAKE  1 TABLET TWICE A DAY 07/04/23  Yes Chanele Douglas, Eilleen Kempf, MD  Multiple Vitamin (MULTIVITAMIN) tablet Take 1 tablet by mouth daily.   Yes [provider]  probenecid (BENEMID) 500 MG tablet TAKE 1 TABLET 3 TIMES A DAY 07/04/23  Yes Eleana Tocco, Eilleen Kempf, MD  rosuvastatin (CRESTOR) 10 MG tablet TAKE 1 TABLET DAILY 06/15/23  Yes Georgina Quint, MD  SYNTHROID 75 MCG tablet TAKE 1 TABLET DAILY 06/15/23  Yes Pearlene Teat, Eilleen Kempf, MD  vitamin C (ASCORBIC ACID) 250 MG tablet Take 250 mg by mouth daily.   Yes [provider]  Colchicine (MITIGARE) 0.6 MG CAPS as needed. Patient not taking: Reported on 11/09/2023    [provider]    No Known Allergies  Patient Active Problem List   Diagnosis Date Noted   Rheumatoid arthritis involving multiple sites with positive rheumatoid factor (HCC) 11/04/2021   Polyarthralgia 07/17/2021   Osteoporosis 05/05/2021   Primary osteoarthritis of both hands 06/21/2019   Primary osteoarthritis of both feet 06/21/2019   Primary osteoarthritis of both knees 06/21/2019   History of sciatica 09/06/2017   Obesity, diabetes, and hypertension syndrome (HCC) 09/06/2017   History of gout 09/06/2017   Dyslipidemia associated with type 2 diabetes mellitus (HCC) 05/13/2015   Hypothyroid 07/25/2012   Gouty arthritis 05/02/2012   Osteoarthritis 05/02/2012   Hypertension associated with diabetes (HCC) 05/02/2012   Hyperlipidemia 05/02/2012   Abnormal Papanicolaou smear of  vagina 05/02/2012    Past Medical History:  Diagnosis Date   Anemia    Arthritis    Cataract    beginning   Diabetes mellitus without complication (HCC)    Gout    High cholesterol    Hypertension    Osteoporosis    Thyroid disease    Walking pneumonia     Past Surgical History:  Procedure Laterality Date   ABDOMINAL HYSTERECTOMY     COLONOSCOPY     x 2   COLONOSCOPY  01/20/2023   COLONOSCOPY WITH PROPOFOL N/A 05/19/2023   Procedure: COLONOSCOPY WITH  PROPOFOL;  Surgeon: Jenel Lucks, MD;  Location: [facility];  Service: Gastroenterology;  Laterality: N/A;   ENDOSCOPIC MUCOSAL RESECTION  05/19/2023   Procedure: ENDOSCOPIC MUCOSAL RESECTION;  Surgeon: Jenel Lucks, MD;  Location: [facility];  Service: Gastroenterology;;   HEMOSTASIS CLIP PLACEMENT  05/19/2023   Procedure: HEMOSTASIS CLIP PLACEMENT;  Surgeon: Jenel Lucks, MD;  Location: [facility];  Service: Gastroenterology;;   POLYPECTOMY N/A 05/19/2023   Procedure: POLYPECTOMY;  Surgeon: Jenel Lucks, MD;  Location: [facility];  Service: Gastroenterology;  Laterality: N/A;   SUBMUCOSAL LIFTING INJECTION  05/19/2023   Procedure: SUBMUCOSAL LIFTING INJECTION;  Surgeon: Jenel Lucks, MD;  Location: [facility];  Service: Gastroenterology;;   TUBAL LIGATION     tumor in stomach removed      Social History   Socioeconomic History   Marital status: Married    Spouse name: Richard   Number of children: 2   Years of education: Not on file   Highest education level: Not on file  Occupational History   Occupation: RETIRED  Tobacco Use   Smoking status: Never    Passive exposure: Past   Smokeless tobacco: Never  Vaping Use   Vaping status: Never Used  Substance and Sexual Activity   Alcohol use: No   Drug use: No   Sexual activity: Yes    Birth control/protection: None  Other Topics Concern   Not on file  Social History Narrative   Lives at home with husband and son   Social Drivers of Corporate investment banker Strain: Low Risk  (10/21/2023)   Overall Financial Resource Strain (CARDIA)    Difficulty of Paying Living Expenses: Not hard at all  Food Insecurity: No Food Insecurity (10/21/2023)   Hunger Vital Sign    Worried About Running Out of Food in the Last Year: Never true    Ran Out of Food in the Last Year: Never true  Transportation Needs: No Transportation Needs (10/21/2023)   PRAPARE - Transportation    Lack of Transportation  (Medical): No    Lack of Transportation (Non-Medical): No  Physical Activity: Inactive (10/21/2023)   Exercise Vital Sign    Days of Exercise per Week: 0 days    Minutes of Exercise per Session: 0 min  Stress: No Stress Concern Present (10/21/2023)   [employer] of Occupational Health - Occupational Stress Questionnaire    Feeling of Stress : Not at all  Social Connections: Moderately Isolated (10/21/2023)   Social Connection and Isolation Panel [NHANES]    Frequency of Communication with Friends and Family: Three times a week    Frequency of Social Gatherings with Friends and Family: Once a week    Attends Religious Services: Never    Database administrator or Organizations: No    Attends Banker Meetings: Never    Marital Status: Married  Intimate  Partner Violence: Not At Risk (10/21/2023)   Humiliation, Afraid, Rape, and Kick questionnaire    Fear of Current or Ex-Partner: No    Emotionally Abused: No    Physically Abused: No    Sexually Abused: No    Family History  Problem Relation Age of Onset   Heart disease Mother    Stroke Mother    Hypertension Mother    Stroke Father    Colon cancer Sister        died earl 42's   ALS Brother    Heart disease Brother    Stroke Maternal Grandmother    Stroke Paternal Grandmother    Cancer Paternal Grandmother    Cancer Paternal Grandfather    SIDS Son    Testicular cancer Son    Down syndrome Son    Colon polyps Neg Hx    Crohn's disease Neg Hx    Esophageal cancer Neg Hx    Stomach cancer Neg Hx    Ulcerative colitis Neg Hx    Rectal cancer Neg Hx      Review of Systems  Constitutional: Negative.  Negative for chills and fever.  HENT: Negative.  Negative for congestion and sore throat.   Respiratory: Negative.  Negative for cough and shortness of breath.   Cardiovascular: Negative.  Negative for chest pain and palpitations.  Gastrointestinal:  Negative for abdominal pain, nausea and vomiting.   Genitourinary: Negative.  Negative for dysuria and hematuria.  Skin: Negative.  Negative for rash.  Neurological: Negative.  Negative for dizziness and headaches.  All other systems reviewed and are negative.   Vitals:   11/09/23 0953  BP: 132/80  Pulse: 71  Temp: 98.4 F (36.9 C)  SpO2: 99%    Physical Exam Vitals reviewed.  Constitutional:      Appearance: Normal appearance.  HENT:     Head: Normocephalic.     Mouth/Throat:     Mouth: Mucous membranes are moist.     Pharynx: Oropharynx is clear.  Eyes:     Extraocular Movements: Extraocular movements intact.     Conjunctiva/sclera: Conjunctivae normal.     Pupils: Pupils are equal, round, and reactive to light.  Cardiovascular:     Rate and Rhythm: Normal rate and regular rhythm.     Pulses: Normal pulses.     Heart sounds: Normal heart sounds.  Pulmonary:     Effort: Pulmonary effort is normal.     Breath sounds: Normal breath sounds.  Musculoskeletal:     Cervical back: No tenderness.  Lymphadenopathy:     Cervical: No cervical adenopathy.  Skin:    General: Skin is warm and dry.     Capillary Refill: Capillary refill takes less than 2 seconds.  Neurological:     General: No focal deficit present.     Mental Status: She is alert and oriented to person, place, and time.     Cranial Nerves: No cranial nerve deficit.     Sensory: No sensory deficit.     Motor: No weakness.     Coordination: Coordination normal.     Gait: Gait normal.  Psychiatric:        Mood and Affect: Mood normal.        Behavior: Behavior normal.    Results for orders placed or performed in visit on 11/09/23 (from the past 24 hours)  POCT HgB A1C     Status: None   Collection Time: 11/09/23 10:06 AM  Result Value Ref Range  Hemoglobin A1C 5.4 4.0 - 5.6 %   HbA1c POC (<> result, manual entry)     HbA1c, POC (prediabetic range)     HbA1c, POC (controlled diabetic range)        ASSESSMENT & PLAN: A total of 46 minutes was spent  with the patient and counseling/coordination of care regarding preparing for this visit, review of most recent office visit notes, review of multiple chronic medical conditions and their management, cardiovascular risks associated with hypertension and diabetes, possibility of TIA and need for urgent neurology evaluation, review of all medications, review of most recent bloodwork results including interpretation of today's hemoglobin A1c, review of health maintenance items, education on nutrition, prognosis, documentation, and need for follow up.   Problem List Items Addressed This Visit       Cardiovascular and Mediastinum   Hypertension associated with diabetes (HCC) - Primary   BP Readings from Last 3 Encounters:  11/09/23 132/80  06/22/23 (!) 143/77  05/19/23 120/60  Well-controlled hypertension Continue losartan 100 mg daily and metoprolol tartrate 25 mg twice a day Well-controlled diabetes with hemoglobin A1c of 5.4 Continue metformin 500 mg twice a day Cardiovascular risks associated with hypertension and diabetes discussed Diet and nutrition discussed       Relevant Orders   CBC with Differential/Platelet   Comprehensive metabolic panel   Hemoglobin A1c   Lipid panel   Microalbumin / creatinine urine ratio   TIA (transient ischemic attack)   Symptoms 3 days ago very concerning for TIA Normal neurologic examination today Advised to start daily baby aspirin Needs urgent neurological evaluation Referral placed today ED precautions given      Relevant Orders   Ambulatory referral to Neurology     Endocrine   Hypothyroid   Clinically euthyroid Continue Synthroid 75 mcg daily      Dyslipidemia associated with type 2 diabetes mellitus (HCC)   Chronic stable conditions Diet and nutrition discussed Continue rosuvastatin 10 mg daily        Relevant Orders   CBC with Differential/Platelet   Comprehensive metabolic panel   Hemoglobin A1c   Lipid panel    Microalbumin / creatinine urine ratio     Musculoskeletal and Integument   Rheumatoid arthritis involving multiple sites with positive rheumatoid factor (HCC)   Chronic stable condition Continue Plaquenil 200 mg twice a day.  Skipping doses on Saturday and Sunday as instructed.        Other   History of gout   Continues probenecid 500 mg 3 times a day       Other Visit Diagnoses       Type 2 diabetes mellitus without complication, without long-term current use of insulin (HCC)       Relevant Orders   POCT HgB A1C (Completed)      Patient Instructions  Transient Ischemic Attack A transient ischemic attack (TIA) causes the same symptoms as a stroke, but the symptoms go away quickly. A TIA happens when blood flow to the brain is blocked. Having a TIA means you may be at risk for a stroke. A TIA is a medical emergency. What are the causes? A TIA is caused by a blocked artery in the head or neck. This means the brain does not get the blood supply it needs. A blockage can be caused by: Fatty buildup in an artery in the head or neck. A blood clot. A tear in an artery. Irritation and swelling (inflammation) of an artery. Sometimes the cause is not  known. What increases the risk? Certain things may make you more likely to have a TIA. Some of these are things that you can change, such as: Using products that have nicotine or tobacco. Not being active. Drinking too much alcohol. Using recreational drugs. Health conditions that may increase your risk include: High blood pressure. High cholesterol. Diabetes. Heart disease. A heartbeat that is not regular (atrial fibrillation). Sickle cell disease. Problems with blood clotting. Other risk factors include: Being over the age of 22. Being female. Being very overweight. Sleep problems (sleep apnea). Having a family history of stroke. Having had blood clots, stroke, TIA, or heart attack in the past. What are the signs or  symptoms? The symptoms of a TIA are like those of a stroke. They can include: Weakness or loss of feeling in your face, arm, or leg. This often happens on one side of your body. Trouble walking. Trouble moving your arms or legs. Trouble talking or understanding what people are saying. Problems with how you see. Feeling dizzy. Feeling confused. Loss of balance or coordination. Feeling like you may vomit (nausea) or vomiting. Having a very bad headache. If you can, note what time you started to have symptoms. Tell your doctor. How is this treated? The goal of treatment is to lower the risk for a stroke. This may include: Changes to diet and lifestyle, such as getting regular exercise and stopping smoking. Taking medicines to: Thin the blood. Lower blood pressure. Lower cholesterol. Treating other health conditions, such as diabetes. If testing shows that an artery in your brain is narrow, your doctor may recommend a procedure to: Take the blockage out of your artery. Open or widen an artery in your neck (carotid angioplasty and stenting). Follow these instructions at home: Medicines Take over-the-counter and prescription medicines only as told by your doctor. If you were told to take aspirin or another medicine to thin your blood, use it exactly as told by your doctor. Taking too much of the medicine can cause bleeding. Taking too little of the medicine may not work to treat the problem. Eating and drinking  Eat 5 or more servings of fruits and vegetables each day. Follow instructions from your doctor about your diet. You may need to follow a certain diet to help lower your risk of a stroke. You may need to: Eat a diet that is low in fat and salt. Eat foods with a lot of fiber. Limit carbohydrates and sugar. If you drink alcohol: Limit how much you have to: 0-1 drink a day for women who are not pregnant. 0-2 drinks a day for men. Know how much alcohol is in a drink. In the  [place] one drink equals one 12 oz bottle of beer (355 mL), one 5 oz glass of wine (148 mL), or one 1 oz glass of hard liquor (44 mL). General instructions Keep a healthy weight. Try to get at least 30 minutes of exercise on most days. Get treatment if you have sleep problems. Do not smoke or use any products that contain nicotine or tobacco. If you need help quitting, ask your doctor. Do not use drugs. Keep all follow-up visits. Your doctor will want to know if you have any more symptoms and to check blood labs if any medicines were prescribed. Where to find more information American Stroke Association: stroke.org Get help right away if: You have chest pain. You have a heartbeat that is not regular. You have any signs of a stroke. "BE FAST" is  an easy way to remember the main warning signs: B - Balance. Dizziness, sudden trouble walking, or loss of balance. E - Eyes. Trouble seeing or a change in how you see. F - Face. Sudden weakness or loss of feeling of the face. The face or eyelid may droop on one side. A - Arms. Weakness or loss of feeling in an arm. This happens all of a sudden and most often on one side of the body. S - Speech. Sudden trouble speaking, slurred speech, or trouble understanding what people say. T - Time. Time to call emergency services. Write down what time symptoms started. You have other signs of a stroke, such as: A sudden, very bad headache with no known cause. Feeling like you may vomit. Vomiting. A seizure. These symptoms may be an emergency. Get help right away. Call 911. Do not wait to see if the symptoms will go away. Do not drive yourself to the hospital. This information is not intended to replace advice given to you by your health care provider. Make sure you discuss any questions you have with your health care provider. Document Revised: 01/22/2022 Document Reviewed: 01/22/2022 Elsevier Patient Education  2024 Elsevier Inc.     Edwina Barth,  MD [facility]

## 2023-11-09 NOTE — Assessment & Plan Note (Signed)
Chronic stable condition Continue Plaquenil 200 mg twice a day.  Skipping doses on Saturday and Sunday as instructed.

## 2023-11-09 NOTE — Patient Instructions (Signed)
Transient Ischemic Attack A transient ischemic attack (TIA) causes the same symptoms as a stroke, but the symptoms go away quickly. A TIA happens when blood flow to the brain is blocked. Having a TIA means you may be at risk for a stroke. A TIA is a medical emergency. What are the causes? A TIA is caused by a blocked artery in the head or neck. This means the brain does not get the blood supply it needs. A blockage can be caused by: Fatty buildup in an artery in the head or neck. A blood clot. A tear in an artery. Irritation and swelling (inflammation) of an artery. Sometimes the cause is not known. What increases the risk? Certain things may make you more likely to have a TIA. Some of these are things that you can change, such as: Using products that have nicotine or tobacco. Not being active. Drinking too much alcohol. Using recreational drugs. Health conditions that may increase your risk include: High blood pressure. High cholesterol. Diabetes. Heart disease. A heartbeat that is not regular (atrial fibrillation). Sickle cell disease. Problems with blood clotting. Other risk factors include: Being over the age of 60. Being female. Being very overweight. Sleep problems (sleep apnea). Having a family history of stroke. Having had blood clots, stroke, TIA, or heart attack in the past. What are the signs or symptoms? The symptoms of a TIA are like those of a stroke. They can include: Weakness or loss of feeling in your face, arm, or leg. This often happens on one side of your body. Trouble walking. Trouble moving your arms or legs. Trouble talking or understanding what people are saying. Problems with how you see. Feeling dizzy. Feeling confused. Loss of balance or coordination. Feeling like you may vomit (nausea) or vomiting. Having a very bad headache. If you can, note what time you started to have symptoms. Tell your doctor. How is this treated? The goal of treatment is to  lower the risk for a stroke. This may include: Changes to diet and lifestyle, such as getting regular exercise and stopping smoking. Taking medicines to: Thin the blood. Lower blood pressure. Lower cholesterol. Treating other health conditions, such as diabetes. If testing shows that an artery in your brain is narrow, your doctor may recommend a procedure to: Take the blockage out of your artery. Open or widen an artery in your neck (carotid angioplasty and stenting). Follow these instructions at home: Medicines Take over-the-counter and prescription medicines only as told by your doctor. If you were told to take aspirin or another medicine to thin your blood, use it exactly as told by your doctor. Taking too much of the medicine can cause bleeding. Taking too little of the medicine may not work to treat the problem. Eating and drinking  Eat 5 or more servings of fruits and vegetables each day. Follow instructions from your doctor about your diet. You may need to follow a certain diet to help lower your risk of a stroke. You may need to: Eat a diet that is low in fat and salt. Eat foods with a lot of fiber. Limit carbohydrates and sugar. If you drink alcohol: Limit how much you have to: 0-1 drink a day for women who are not pregnant. 0-2 drinks a day for men. Know how much alcohol is in a drink. In the [place] one drink equals one 12 oz bottle of beer (355 mL), one 5 oz glass of wine (148 mL), or one 1 oz glass of hard   liquor (44 mL). General instructions Keep a healthy weight. Try to get at least 30 minutes of exercise on most days. Get treatment if you have sleep problems. Do not smoke or use any products that contain nicotine or tobacco. If you need help quitting, ask your doctor. Do not use drugs. Keep all follow-up visits. Your doctor will want to know if you have any more symptoms and to check blood labs if any medicines were prescribed. Where to find more  information American Stroke Association: stroke.org Get help right away if: You have chest pain. You have a heartbeat that is not regular. You have any signs of a stroke. "BE FAST" is an easy way to remember the main warning signs: B - Balance. Dizziness, sudden trouble walking, or loss of balance. E - Eyes. Trouble seeing or a change in how you see. F - Face. Sudden weakness or loss of feeling of the face. The face or eyelid may droop on one side. A - Arms. Weakness or loss of feeling in an arm. This happens all of a sudden and most often on one side of the body. S - Speech. Sudden trouble speaking, slurred speech, or trouble understanding what people say. T - Time. Time to call emergency services. Write down what time symptoms started. You have other signs of a stroke, such as: A sudden, very bad headache with no known cause. Feeling like you may vomit. Vomiting. A seizure. These symptoms may be an emergency. Get help right away. Call 911. Do not wait to see if the symptoms will go away. Do not drive yourself to the hospital. This information is not intended to replace advice given to you by your health care provider. Make sure you discuss any questions you have with your health care provider. Document Revised: 01/22/2022 Document Reviewed: 01/22/2022 Elsevier Patient Education  2024 Elsevier Inc.  

## 2023-11-16 ENCOUNTER — Encounter: Payer: Self-pay | Admitting: Radiology

## 2023-11-18 ENCOUNTER — Ambulatory Visit: Admitting: Neurology

## 2023-11-22 NOTE — Progress Notes (Signed)
 Office Visit Note  Patient: Isabella Dixon             Date of Birth: Dec 12, 1945           MRN: [id number]             PCP: Elvira Hammersmith, MD Referring: Elvira Hammersmith, * Visit Date: 12/06/2023 Occupation: @GUAROCC @  Subjective:  Medication management   History of Present Illness: Isabella Dixon is a 78 y.o. female with seropositive rheumatoid arthritis, osteoarthritis and gout.  She has had some discomfort in her left first MTP joint and her left ankle a month ago.  She states she took colchicine for that episode until the symptoms resolved.  She has not had a flare of rheumatoid arthritis per patient.  She has been taking Plaquenil 200 mg twice daily alternating with 200 mg daily.  She denies any interruption in the treatment.  She states she had an episode of TIA about a month ago which lasted for about 30 seconds.  She was evaluated by her PCP and has an appointment coming up with the neurologist.  She has been also taking a baby aspirin.   Activities of Daily Living:  Patient reports morning stiffness for a few minutes.   Patient Denies nocturnal pain.  Difficulty dressing/grooming: Denies Difficulty climbing stairs: Denies Difficulty getting out of chair: Denies Difficulty using hands for taps, buttons, cutlery, and/or writing: Denies  Review of Systems  Constitutional:  Positive for fatigue.  HENT:  Negative for mouth sores and mouth dryness.   Eyes:  Negative for pain and dryness.  Respiratory:  Negative for shortness of breath and difficulty breathing.   Cardiovascular:  Negative for chest pain and palpitations.  Gastrointestinal:  Negative for blood in stool, constipation and diarrhea.  Endocrine: Negative for increased urination.  Genitourinary:  Negative for involuntary urination.  Musculoskeletal:  Positive for joint pain, joint pain, myalgias, muscle weakness, morning stiffness and myalgias. Negative for gait problem, joint swelling and  muscle tenderness.  Skin:  Positive for hair loss. Negative for color change, rash and sensitivity to sunlight.  Allergic/Immunologic: Negative for susceptible to infections.  Neurological:  Positive for headaches. Negative for dizziness.  Hematological:  Negative for swollen glands.  Psychiatric/Behavioral:  Negative for depressed mood and sleep disturbance. The patient is not nervous/anxious.     PMFS History:  Patient Active Problem List   Diagnosis Date Noted   TIA (transient ischemic attack) 11/09/2023   Rheumatoid arthritis involving multiple sites with positive rheumatoid factor (HCC) 11/04/2021   Polyarthralgia 07/17/2021   Osteoporosis 05/05/2021   Primary osteoarthritis of both hands 06/21/2019   Primary osteoarthritis of both feet 06/21/2019   Primary osteoarthritis of both knees 06/21/2019   History of sciatica 09/06/2017   Obesity, diabetes, and hypertension syndrome (HCC) 09/06/2017   History of gout 09/06/2017   Dyslipidemia associated with type 2 diabetes mellitus (HCC) 05/13/2015   Hypothyroid 07/25/2012   Gouty arthritis 05/02/2012   Osteoarthritis 05/02/2012   Hypertension associated with diabetes (HCC) 05/02/2012   Hyperlipidemia 05/02/2012   Abnormal Papanicolaou smear of vagina 05/02/2012    Past Medical History:  Diagnosis Date   Anemia    Arthritis    Cataract    beginning   Diabetes mellitus without complication (HCC)    Gout    High cholesterol    Hypertension    Osteoporosis    Thyroid disease    Walking pneumonia     Family History  Problem Relation  Age of Onset   Heart disease Mother    Stroke Mother    Hypertension Mother    Stroke Father    Colon cancer Sister        died earl 47's   ALS Brother    Heart disease Brother    Stroke Maternal Grandmother    Stroke Paternal Grandmother    Cancer Paternal Grandmother    Cancer Paternal Grandfather    SIDS Son    Testicular cancer Son    Down syndrome Son    Colon polyps Neg Hx     Crohn's disease Neg Hx    Esophageal cancer Neg Hx    Stomach cancer Neg Hx    Ulcerative colitis Neg Hx    Rectal cancer Neg Hx    Past Surgical History:  Procedure Laterality Date   ABDOMINAL HYSTERECTOMY     COLONOSCOPY     x 2   COLONOSCOPY  01/20/2023   COLONOSCOPY  10/2023   COLONOSCOPY WITH PROPOFOL N/A 05/19/2023   Procedure: COLONOSCOPY WITH PROPOFOL;  Surgeon: Elois Hair, MD;  Location: [facility];  Service: Gastroenterology;  Laterality: N/A;   ENDOSCOPIC MUCOSAL RESECTION  05/19/2023   Procedure: ENDOSCOPIC MUCOSAL RESECTION;  Surgeon: Elois Hair, MD;  Location: [facility];  Service: Gastroenterology;;   HEMOSTASIS CLIP PLACEMENT  05/19/2023   Procedure: HEMOSTASIS CLIP PLACEMENT;  Surgeon: Elois Hair, MD;  Location: [facility];  Service: Gastroenterology;;   POLYPECTOMY N/A 05/19/2023   Procedure: POLYPECTOMY;  Surgeon: Elois Hair, MD;  Location: [facility];  Service: Gastroenterology;  Laterality: N/A;   SUBMUCOSAL LIFTING INJECTION  05/19/2023   Procedure: SUBMUCOSAL LIFTING INJECTION;  Surgeon: Elois Hair, MD;  Location: [facility];  Service: Gastroenterology;;   TUBAL LIGATION     tumor in stomach removed     Social History   Social History Narrative   Lives at home with husband and son         Are you right handed or left handed? Right Handed    Are you currently employed ? No    What is your current occupation?    Do you live at home alone? No    Who lives with you? Lives with husband and son    What type of home do you live in: 1 story or 2 story? Lives in a one story home       Immunization History  Administered Date(s) Administered   Fluad Quad(high Dose 65+) 05/02/2019, 04/30/2020, 05/05/2021, 05/10/2022   Fluad Trivalent(High Dose 65+) 05/11/2023   Influenza Split 05/02/2012   Influenza, High Dose Seasonal PF 06/15/2018   Influenza,inj,Quad PF,6+ Mos 05/08/2013, 04/30/2014, 05/13/2015,  05/17/2016, 09/06/2017   PFIZER(Purple Top)SARS-COV-2 Vaccination 09/17/2019, 10/08/2019, 06/05/2020, 06/04/2021   Pneumococcal Conjugate-13 05/13/2015   Pneumococcal Polysaccharide-23 11/01/2018   Tdap 03/14/2008     Objective: Vital Signs: BP (!) 163/82 (BP Location: Left Arm, Patient Position: Sitting, Cuff Size: Large)   Pulse 72   Resp 13   Ht 5\' 3"  (1.6 m)   Wt 196 lb 6.4 oz (89.1 kg)   BMI 34.79 kg/m    Physical Exam Vitals and nursing note reviewed.  Constitutional:      Appearance: She is well-developed.  HENT:     Head: Normocephalic and atraumatic.  Eyes:     Conjunctiva/sclera: Conjunctivae normal.  Cardiovascular:     Rate and Rhythm: Normal rate and regular rhythm.     Heart sounds: Normal heart sounds.  Pulmonary:  Effort: Pulmonary effort is normal.     Breath sounds: Normal breath sounds.  Abdominal:     General: Bowel sounds are normal.     Palpations: Abdomen is soft.  Musculoskeletal:     Cervical back: Normal range of motion.  Lymphadenopathy:     Cervical: No cervical adenopathy.  Skin:    General: Skin is warm and dry.     Capillary Refill: Capillary refill takes less than 2 seconds.  Neurological:     Mental Status: She is alert and oriented to person, place, and time.  Psychiatric:        Behavior: Behavior normal.      Musculoskeletal Exam: Cervical spine was in good range of motion.  Thoracic kyphosis was present.  She had no tenderness over thoracic or lumbar region.  She had some discomfort range of motion of her right shoulder.  Elbow joints and wrist joints with good range of motion.  She had tenderness over her wrist joints.  There was no synovitis over MCPs PIPs or DIPs.  Bilateral PIP and DIP thickening was noted.  Hip joints and knee joints were in good range of motion without any warmth swelling or effusion.  There was no tenderness over ankles or MTPs.  CDAI Exam: CDAI Score: 6  Patient Global: 20 / 100; Provider Global: 20 /  100 Swollen: 0 ; Tender: 2  Joint Exam 12/06/2023      Right  Left  Wrist   Tender   Tender     Investigation: No additional findings.  Imaging: No results found.  Recent Labs: Lab Results  Component Value Date   WBC 6.9 11/09/2023   HGB 13.5 11/09/2023   PLT 249.0 11/09/2023   NA 139 11/09/2023   K 3.9 11/09/2023   CL 101 11/09/2023   CO2 26 11/09/2023   GLUCOSE 99 11/09/2023   BUN 16 11/09/2023   CREATININE 0.81 11/09/2023   BILITOT 0.5 11/09/2023   ALKPHOS 80 11/09/2023   AST 22 11/09/2023   ALT 15 11/09/2023   PROT 9.0 (H) 11/09/2023   ALBUMIN 4.6 11/09/2023   CALCIUM 10.3 11/09/2023   GFRAA 86 07/02/2020    Speciality Comments: PLQ Eye Exam 01/31/2023 WNL @ [facility] Follow up in 1 year PLQ 09/28/2021 Allopurinol- diarrhea  Procedures:  No procedures performed Allergies: Patient has no known allergies.   Assessment / Plan:     Visit Diagnoses: Rheumatoid arthritis involving multiple sites with positive rheumatoid factor (HCC) - RF+, responsive to prednisone use: -Patient states she has been having discomfort in her elbow joints and wrist joints.  No synovitis was noted on the examination.  She had some tenderness over wrist joints.  She states she has been taking hydroxychloroquine 2 tablets alternating with 1 tablet every other day.  She takes 1 tablet on Saturday and Sunday.  Total tablets per week are 10.  She denies any interruption in the treatment.  I advised her to contact us  if she develops any increased symptoms.  I will check sed rate with her next labs.  Plan: Sedimentation rate  High risk medication use - Plaquenil 200 mg 2 tablets alternating with 1 tablet every other day Monday through friday and 1 tablet Saturday and Sundays. PLQ Eye Exam 01/31/2023 -information of immunization was placed in the AVS.  Annual eye examination to monitor for ocular toxicity was advised.  Plan: CBC with Differential/Platelet, Comprehensive metabolic panel  with GFR  Primary osteoarthritis of both hands-she had  bilateral PIP and DIP thickening suggestive of osteoarthritis.  Joint protection muscle strengthening was discussed.  Primary osteoarthritis of both knees-she has intermittent discomfort in her knee joints.  No warmth swelling or effusion was noted.  Primary osteoarthritis of both feet-bilateral PIP and DIP thickening was noted.  Patient reports recent discomfort in her left foot over the lateral aspect of her left foot and the first MTP.  No synovitis was noted.  Patient states that she thought it was a gout episode in her symptoms responded to colchicine.  Neck pain -she had some limitation with range of motion of the cervical spine without any radiculopathy.  X-rays in the past: multilevel spondylosis and facet joint arthropathy.Most severe narrowing was noted between C5-C6 and C6-C7 with anterior osteophytes.  Spondylosis of lumbar spine-she has intermittent pain in her lower back.  Gouty arthritis -patient reports recent gout flare.  According to her it responded to colchicine.  I will check uric acid with the next labs.  Probenecid 500 mg 3 times daily. uric acid: 4.8 on 06/22/2023. - Plan: Uric acid  Osteopenia of multiple sites - DEXA updated on 09/28/19: The BMD measured at Femur Neck Right is 0.718 g/cm2 with a T-score of -2.3.  She was advised to get repeat DEXA scan.  Essential hypertension-blood pressure was elevated at 163/82.  Repeat blood pressure was 154/78.  She was advised to monitor blood pressure closely and follow-up with her PCP.  Patient reports recent episode of TIA for which she was evaluated by her PCP.  Her appointment with the neurologist is pending.  Other medical problems are listed as follows:  History of hypothyroidism  History of hyperlipidemia  Type 2 diabetes mellitus without complication, without long-term current use of insulin (HCC)  Orders: Orders Placed This Encounter  Procedures   CBC with  Differential/Platelet   Comprehensive metabolic panel with GFR   Uric acid   Sedimentation rate   No orders of the defined types were placed in this encounter.    Follow-Up Instructions: Return in about 5 months (around 05/07/2024) for Rheumatoid arthritis, Osteoarthritis.   Nicholas Bari, MD  Note - This record has been created using Animal nutritionist.  Chart creation errors have been sought, but may not always  have been located. Such creation errors do not reflect on  the standard of medical care.

## 2023-11-23 ENCOUNTER — Ambulatory Visit (INDEPENDENT_AMBULATORY_CARE_PROVIDER_SITE_OTHER): Admitting: Neurology

## 2023-11-23 ENCOUNTER — Encounter: Payer: Self-pay | Admitting: Neurology

## 2023-11-23 VITALS — BP 158/80 | HR 85 | Ht 63.0 in | Wt 194.0 lb

## 2023-11-23 DIAGNOSIS — G459 Transient cerebral ischemic attack, unspecified: Secondary | ICD-10-CM | POA: Diagnosis not present

## 2023-11-23 MED ORDER — ASPIRIN 81 MG PO CAPS: 81.0000 mg | ORAL_CAPSULE | Freq: Every day | ORAL | Status: AC

## 2023-11-23 NOTE — Progress Notes (Signed)
 [facility] Clinic Note - Initial Visit   Date: 11/23/2023   Isabella Dixon MRN: [id number] DOB: [birth year]   Dear Dr. Terence Lux:  Thank you for your kind referral of Isabella Dixon for consultation of TIA. Although her history is well known to you, please allow [place] to reiterate it for the purpose of our medical record. The patient was accompanied to the clinic by self.    Isabella Dixon is a 78 y.o. right-handed female with well-controlled diabetes mellitus (HbA1c 5.9), hypertension, hyperlipidemia, hypothyroidism, gout, and RA presenting for evaluation of TIA.   IMPRESSION/PLAN: TIA manifesting with right hand weakness and dysarthria (10/2023)   - MRI brain wo contrast - MRA head and neck - Start aspirin 81mg  daily - Continue crestor 40mg  (LDL 38) - BP is elevated today, she takes losartan 100mg  daily and metoprolol 25mg  twice daily.  Recommend that you follow-up with PCP.    Return to clinic in 3 months  ------------------------------------------------------------- History of present illness: On 3/16, she was watching TV and suddenly develop tingling and cramp-like sensation involving the right hand along with slurred speech.  Symptoms lasted 30-min and then resolved.  She has not had recurrence of symptoms.  She denied any right leg symptoms or facial weakness/numbness.  She does not take a daily aspirin.    She reports having two spells of difficulty with speech lasting about 15 minutes, last in 2015.    Out-side paper records, electronic medical record, and images have been reviewed where available and summarized as:  Lab Results  Component Value Date   HGBA1C 5.9 11/09/2023   Lab Results  Component Value Date   VITAMINB12 744 02/11/2017   Lab Results  Component Value Date   TSH 1.06 11/08/2022   Lab Results  Component Value Date   ESRSEDRATE 28 07/27/2022    Past Medical History:  Diagnosis Date   Anemia     Arthritis    Cataract    beginning   Diabetes mellitus without complication (HCC)    Gout    High cholesterol    Hypertension    Osteoporosis    Thyroid disease    Walking pneumonia     Past Surgical History:  Procedure Laterality Date   ABDOMINAL HYSTERECTOMY     COLONOSCOPY     x 2   COLONOSCOPY  01/20/2023   COLONOSCOPY WITH PROPOFOL N/A 05/19/2023   Procedure: COLONOSCOPY WITH PROPOFOL;  Surgeon: Jenel Lucks, MD;  Location: [facility];  Service: Gastroenterology;  Laterality: N/A;   ENDOSCOPIC MUCOSAL RESECTION  05/19/2023   Procedure: ENDOSCOPIC MUCOSAL RESECTION;  Surgeon: Jenel Lucks, MD;  Location: [facility];  Service: Gastroenterology;;   HEMOSTASIS CLIP PLACEMENT  05/19/2023   Procedure: HEMOSTASIS CLIP PLACEMENT;  Surgeon: Jenel Lucks, MD;  Location: [facility];  Service: Gastroenterology;;   POLYPECTOMY N/A 05/19/2023   Procedure: POLYPECTOMY;  Surgeon: Jenel Lucks, MD;  Location: [facility];  Service: Gastroenterology;  Laterality: N/A;   SUBMUCOSAL LIFTING INJECTION  05/19/2023   Procedure: SUBMUCOSAL LIFTING INJECTION;  Surgeon: Jenel Lucks, MD;  Location: [facility];  Service: Gastroenterology;;   TUBAL LIGATION     tumor in stomach removed       Medications:  Outpatient Encounter Medications as of 11/23/2023  Medication Sig Note   chlorpheniramine (CHLOR-TRIMETON) 4 MG tablet Take 4 mg by mouth daily.    Cholecalciferol (VITAMIN D-3 PO) Take 2,000 Units by mouth daily. 10/28/2020: Take 20 mcg daily  colchicine 0.6 MG tablet Take 1 tablet (0.6 mg total) by mouth 2 (two) times daily.    hydroxychloroquine (PLAQUENIL) 200 MG tablet TAKE 2 TABLETS ALTERNATING WITH 1 TABLET MONDAY THROUGH FRIDAY AND 1 TABLET ON SATURDAY AND SUNDAY AS DIRECTED    losartan (COZAAR) 100 MG tablet TAKE 1 TABLET DAILY    metFORMIN (GLUCOPHAGE) 500 MG tablet TAKE 1 TABLET TWICE DAILY  WITH MEALS    metoprolol tartrate (LOPRESSOR) 25 MG tablet  TAKE 1 TABLET TWICE A DAY    Multiple Vitamin (MULTIVITAMIN) tablet Take 1 tablet by mouth daily.    probenecid (BENEMID) 500 MG tablet TAKE 1 TABLET 3 TIMES A DAY    rosuvastatin (CRESTOR) 10 MG tablet TAKE 1 TABLET DAILY    SYNTHROID 75 MCG tablet TAKE 1 TABLET DAILY    vitamin C (ASCORBIC ACID) 250 MG tablet Take 250 mg by mouth daily. 10/28/2020: Taking 1000 mg daily   [DISCONTINUED] Colchicine (MITIGARE) 0.6 MG CAPS as needed. (Patient not taking: Reported on 11/23/2023)    No facility-administered encounter medications on file as of 11/23/2023.    Allergies: No Known Allergies  Family History: Family History  Problem Relation Age of Onset   Heart disease Mother    Stroke Mother    Hypertension Mother    Stroke Father    Colon cancer Sister        died earl 32's   ALS Brother    Heart disease Brother    Stroke Maternal Grandmother    Stroke Paternal Grandmother    Cancer Paternal Grandmother    Cancer Paternal Grandfather    SIDS Son    Testicular cancer Son    Down syndrome Son    Colon polyps Neg Hx    Crohn's disease Neg Hx    Esophageal cancer Neg Hx    Stomach cancer Neg Hx    Ulcerative colitis Neg Hx    Rectal cancer Neg Hx     Social History: Social History   Tobacco Use   Smoking status: Never    Passive exposure: Past   Smokeless tobacco: Never  Vaping Use   Vaping status: Never Used  Substance Use Topics   Alcohol use: No   Drug use: No   Social History   Social History Narrative   Lives at home with husband and son         Are you right handed or left handed? Right Handed    Are you currently employed ? No    What is your current occupation?    Do you live at home alone? No    Who lives with you? Lives with husband and son    What type of home do you live in: 1 story or 2 story? Lives in a one story home        Vital Signs:  BP (!) 158/80   Pulse 85   Ht 5\' 3"  (1.6 m)   Wt 194 lb (88 kg)   SpO2 98%   BMI 34.37 kg/m   Neurological  Exam: MENTAL STATUS including orientation to time, place, person, recent and remote memory, attention span and concentration, language, and fund of knowledge is normal.  Speech is not dysarthric.  CRANIAL NERVES: II:  No visual field defects.     III-IV-VI: Pupils equal round and reactive to light.  Normal conjugate, extra-ocular eye movements in all directions of gaze.  No nystagmus.  No ptosis.   V:  Normal facial sensation.  VII:  Normal facial symmetry and movements.   VIII:  Normal hearing and vestibular function.   IX-X:  Normal palatal movement.   XI:  Normal shoulder shrug and head rotation.   XII:  Normal tongue strength and range of motion, no deviation or fasciculation.  MOTOR:  No atrophy, fasciculations or abnormal movements.  No pronator drift.   Upper Extremity:  Right  Left  Deltoid  5/5   5/5   Biceps  5/5   5/5   Triceps  5/5   5/5   Wrist extensors  5/5   5/5   Wrist flexors  5/5   5/5   Finger extensors  5/5   5/5   Finger flexors  5/5   5/5   Dorsal interossei  5/5   5/5   Abductor pollicis  5/5   5/5   Tone (Ashworth scale)  0  0   Lower Extremity:  Right  Left  Hip flexors  5/5   5/5   Knee flexors  5/5   5/5   Knee extensors  5/5   5/5   Dorsiflexors  5/5   5/5   Plantarflexors  5/5   5/5   Toe extensors  5/5   5/5   Toe flexors  5/5   5/5   Tone (Ashworth scale)  0  0   MSRs:                                           Right        Left brachioradialis 2+  2+  biceps 2+  2+  triceps 2+  2+  patellar 2+  2+  ankle jerk 2+  2+  Hoffman no  no  plantar response down  down   SENSORY:  Normal and symmetric perception of light touch and vibration.   COORDINATION/GAIT: Normal finger-to- nose-finger.  Intact rapid alternating movements bilaterally.  Able to rise from a chair without using arms.  Gait narrow based and stable. Tandem and stressed gait intact.     Thank you for allowing me to participate in patient's care.  If I can answer any  additional questions, I would be pleased to do so.    Sincerely,    Maryse Brierley K. Allena Katz, DO

## 2023-11-23 NOTE — Patient Instructions (Signed)
 Start aspirin 81mg  daily  Monitor your blood pressure at home and if it remains > 140/90, please follow-up with primary care doctor  Check MRI brain and vessel imaging

## 2023-12-06 ENCOUNTER — Ambulatory Visit: Payer: Medicare Other | Attending: Rheumatology | Admitting: Rheumatology

## 2023-12-06 ENCOUNTER — Encounter: Payer: Self-pay | Admitting: Rheumatology

## 2023-12-06 VITALS — BP 154/78 | HR 70 | Resp 13 | Ht 63.0 in | Wt 196.4 lb

## 2023-12-06 DIAGNOSIS — I10 Essential (primary) hypertension: Secondary | ICD-10-CM

## 2023-12-06 DIAGNOSIS — Z8639 Personal history of other endocrine, nutritional and metabolic disease: Secondary | ICD-10-CM

## 2023-12-06 DIAGNOSIS — M19072 Primary osteoarthritis, left ankle and foot: Secondary | ICD-10-CM

## 2023-12-06 DIAGNOSIS — M0579 Rheumatoid arthritis with rheumatoid factor of multiple sites without organ or systems involvement: Secondary | ICD-10-CM | POA: Diagnosis not present

## 2023-12-06 DIAGNOSIS — M170 Bilateral primary osteoarthritis of knee: Secondary | ICD-10-CM

## 2023-12-06 DIAGNOSIS — M542 Cervicalgia: Secondary | ICD-10-CM

## 2023-12-06 DIAGNOSIS — M109 Gout, unspecified: Secondary | ICD-10-CM

## 2023-12-06 DIAGNOSIS — M19071 Primary osteoarthritis, right ankle and foot: Secondary | ICD-10-CM

## 2023-12-06 DIAGNOSIS — M19041 Primary osteoarthritis, right hand: Secondary | ICD-10-CM | POA: Diagnosis not present

## 2023-12-06 DIAGNOSIS — Z79899 Other long term (current) drug therapy: Secondary | ICD-10-CM | POA: Diagnosis not present

## 2023-12-06 DIAGNOSIS — M8589 Other specified disorders of bone density and structure, multiple sites: Secondary | ICD-10-CM

## 2023-12-06 DIAGNOSIS — M19042 Primary osteoarthritis, left hand: Secondary | ICD-10-CM

## 2023-12-06 DIAGNOSIS — E119 Type 2 diabetes mellitus without complications: Secondary | ICD-10-CM

## 2023-12-06 DIAGNOSIS — M47816 Spondylosis without myelopathy or radiculopathy, lumbar region: Secondary | ICD-10-CM

## 2023-12-06 NOTE — Patient Instructions (Addendum)
 Vaccines You are taking a medication(s) that can suppress your immune system.  The following immunizations are recommended: Flu annually Covid-19  Td/Tdap (tetanus, diphtheria, pertussis) every 10 years Pneumonia (Prevnar 15 then Pneumovax 23 at least 1 year apart.  Alternatively, can take Prevnar 20 without needing additional dose) Shingrix: 2 doses from 4 weeks to 6 months apart  Please check with your PCP to make sure you are up to date.   Standing Labs We placed an order today for your standing lab work.   Please have your standing labs drawn in August  Please have your labs drawn 2 weeks prior to your appointment so that the provider can discuss your lab results at your appointment, if possible.  Please note that you may see your imaging and lab results in MyChart before we have reviewed them. We will contact you once all results are reviewed. Please allow our office up to 72 hours to thoroughly review all of the results before contacting the office for clarification of your results.  WALK-IN LAB HOURS  Monday through Thursday from 8:00 am -12:30 pm and 1:00 pm-5:00 pm and Friday from 8:00 am-12:00 pm.  Patients with office visits requiring labs will be seen before walk-in labs.  You may encounter longer than normal wait times. Please allow additional time. Wait times may be shorter on  Monday and Thursday afternoons.  We do not book appointments for walk-in labs. We appreciate your patience and understanding with our staff.   Labs are drawn by [facility]. Please bring your co-pay at the time of your lab draw.  You may receive a bill from [facility] for your lab work.  Please note if you are on Hydroxychloroquine and and an order has been placed for a Hydroxychloroquine level,  you will need to have it drawn 4 hours or more after your last dose.  If you wish to have your labs drawn at another location, please call the office 24 hours in advance so we can fax the orders.  The office is  located at [address], [address], [place], [place] [address]   If you have any questions regarding directions or hours of operation,  please call [phone].   As a reminder, please drink plenty of water prior to coming for your lab work. Thanks!

## 2023-12-31 ENCOUNTER — Ambulatory Visit
Admission: RE | Admit: 2023-12-31 | Discharge: 2023-12-31 | Disposition: A | Discharge status: Home / Self Care | Source: Ambulatory Visit | Attending: Neurology | Admitting: Neurology

## 2023-12-31 DIAGNOSIS — G459 Transient cerebral ischemic attack, unspecified: Secondary | ICD-10-CM

## 2023-12-31 MED ORDER — GADOPICLENOL 0.5 MMOL/ML IV SOLN
10.0000 mL | Freq: Once | INTRAVENOUS | Status: AC | PRN
  Administered 2023-12-31: 9 mL via INTRAVENOUS

## 2023-12-31 MED ORDER — GADOPICLENOL 0.5 MMOL/ML IV SOLN: 9.0000 mL | Freq: Once | INTRAVENOUS | Status: DC | PRN

## 2024-01-03 ENCOUNTER — Other Ambulatory Visit: Payer: Self-pay | Admitting: Rheumatology

## 2024-01-25 ENCOUNTER — Other Ambulatory Visit: Payer: Self-pay | Admitting: *Deleted

## 2024-01-25 ENCOUNTER — Ambulatory Visit: Payer: Self-pay | Admitting: Neurology

## 2024-01-25 MED ORDER — HYDROXYCHLOROQUINE SULFATE 200 MG PO TABS: ORAL_TABLET | ORAL | 0 refills | Status: DC

## 2024-01-25 NOTE — Telephone Encounter (Signed)
 Patient contacted the office   Last Fill: 10/24/2023  Eye exam: 01/31/2023 WNL Patient states she has an appointment scheduled June 2025 to update her eye exam.    Labs: 11/09/2023 Total Protein 9.0, CBC WNL  Next Visit: 05/07/2024  Last Visit: 12/06/2023  [id number] arthritis involving multiple sites with positive rheumatoid factor   Current Dose per office note 12/06/2023: Plaquenil  200 mg 2 tablets alternating with 1 tablet every other day Monday through friday and 1 tablet Saturday and Sundays   Okay to refill Plaquenil ?

## 2024-02-06 LAB — HM DIABETES EYE EXAM

## 2024-02-22 ENCOUNTER — Ambulatory Visit: Admitting: Neurology

## 2024-02-28 ENCOUNTER — Encounter: Payer: Self-pay | Admitting: Neurology

## 2024-02-28 ENCOUNTER — Ambulatory Visit (INDEPENDENT_AMBULATORY_CARE_PROVIDER_SITE_OTHER): Admitting: Neurology

## 2024-02-28 VITALS — BP 151/84 | HR 71 | Ht 63.0 in | Wt 197.0 lb

## 2024-02-28 DIAGNOSIS — M542 Cervicalgia: Secondary | ICD-10-CM

## 2024-02-28 DIAGNOSIS — R292 Abnormal reflex: Secondary | ICD-10-CM | POA: Diagnosis not present

## 2024-02-28 DIAGNOSIS — G459 Transient cerebral ischemic attack, unspecified: Secondary | ICD-10-CM | POA: Diagnosis not present

## 2024-02-28 MED ORDER — TIZANIDINE HCL 2 MG PO TABS: 2.0000 mg | ORAL_TABLET | Freq: Every evening | ORAL | 3 refills | Status: DC | PRN

## 2024-02-28 NOTE — Patient Instructions (Addendum)
 MRI cervical spine   Start tizanidine  2mg  at bedtime for neck pain  Follow-up with primary care provider for high blood pressure

## 2024-02-28 NOTE — Progress Notes (Signed)
 Follow-up Visit   Date: 02/28/2024    Isabella Dixon MRN: [id number] DOB: February 05, 1946    Isabella Dixon is a 78 y.o. right-handed Caucasian female with well-controlled diabetes mellitus (HbA1c 5.9), hypertension, hyperlipidemia, hypothyroidism, gout, and RA returning to the clinic for follow-up of TIA.  The patient was accompanied to the clinic by self.  IMPRESSION/PLAN: 1. TIA manifesting with right hand weakness and dysarthria (10/2023).  MRI brain is normal.  MRA head and neck shows no large vessel stenosis.  She had had no further spells.  - Continue aspirin  81mg  daily  - Continue crestor  40mg  (LDL 38)  - BP is elevated today and her prior visit.  She was recommended to follow-up with PCP to determine if medication adjustment is needed, this was reiterated today.   2.  Cervicalgia.  Exam shows hyperreflexia, so will order imaging to evaluate for canal stenosis  - MRI cervical spine without contrast  - Start tizanidine  2mg  at bedtime as needed for neck pain  Return to clinic in 4 months  --------------------------------------------- History of present illness: On 3/16, she was watching TV and suddenly develop tingling and cramp-like sensation involving the right hand along with slurred speech.  Symptoms lasted 30-min and then resolved.  She has not had recurrence of symptoms.  She denied any right leg symptoms or facial weakness/numbness.  She does not take a daily aspirin .     She reports having two spells of difficulty with speech lasting about 15 minutes, last in 2015.   UPDATE 02/28/2024:  She is here for follow-up visit.  She has not had any more spells of speech change or right hand weakness.  She is compliant with all medications, including aspirin  and crestor .   Her more bothersome issue is neck pain and stiffness.  She reports that pain is severe at times and exacerbated by neck rotation or even bumpy car ride, such as going over railroad tracks or a dip in the  road.  No arm or leg weakness/numbness.   Medications:  Current Outpatient Medications on File Prior to Visit  Medication Sig Dispense Refill   Aspirin  81 MG CAPS Take 81 mg by mouth daily.     chlorpheniramine (CHLOR-TRIMETON) 4 MG tablet Take 4 mg by mouth daily.     Cholecalciferol (VITAMIN D -3 PO) Take 2,000 Units by mouth daily.     colchicine  0.6 MG tablet Take 1 tablet (0.6 mg total) by mouth 2 (two) times daily. (Patient taking differently: Take 0.6 mg by mouth as needed.) 180 tablet 0   hydroxychloroquine  (PLAQUENIL ) 200 MG tablet TAKE 2 TABLETS ALTERNATING WITH 1 TABLET MONDAY THROUGH FRIDAY AND 1 TABLET ON SATURDAY AND SUNDAY AS DIRECTED 120 tablet 0   losartan  (COZAAR ) 100 MG tablet TAKE 1 TABLET DAILY 90 tablet 3   metFORMIN  (GLUCOPHAGE ) 500 MG tablet TAKE 1 TABLET TWICE DAILY  WITH MEALS 180 tablet 3   metoprolol  tartrate (LOPRESSOR ) 25 MG tablet TAKE 1 TABLET TWICE A DAY 180 tablet 3   Multiple Vitamin (MULTIVITAMIN) tablet Take 1 tablet by mouth daily.     probenecid  (BENEMID ) 500 MG tablet TAKE 1 TABLET 3 TIMES A DAY 270 tablet 3   rosuvastatin  (CRESTOR ) 10 MG tablet TAKE 1 TABLET DAILY 90 tablet 3   SYNTHROID  75 MCG tablet TAKE 1 TABLET DAILY 90 tablet 3   vitamin C (ASCORBIC ACID) 250 MG tablet Take 250 mg by mouth daily.     No current facility-administered medications on file prior  to visit.    Allergies: No Known Allergies  Vital Signs:  BP (!) 151/84   Pulse 71   Ht 5' 3 (1.6 m)   Wt 197 lb (89.4 kg)   SpO2 97%   BMI 34.90 kg/m   Neurological Exam: MENTAL STATUS including orientation to time, place, person, recent and remote memory, attention span and concentration, language, and fund of knowledge is normal.  Speech is not dysarthric.  CRANIAL NERVES:  No visual field defects.  Pupils equal round and reactive to light.  Normal conjugate, extra-ocular eye movements in all directions of gaze.  No ptosis.  Face is symmetric. Palate elevates symmetrically.   Tongue is midline.  MOTOR:  Motor strength is 5/5 in all extremities.  No atrophy, fasciculations or abnormal movements.  No pronator drift.  Tone is normal.    MSRs:  Reflexes are 3+/4 in the arms and 2+/4 in the legs.  SENSORY:  Intact to vibration throughout.  COORDINATION/GAIT:  Normal finger-to- nose-finger.  Intact rapid alternating movements bilaterally.  Gait narrow based and stable.   Data: MRA head and neck wwo contrast 01/25/2024: No evidence of recent infarction, hemorrhage, or mass.  No hemodynamically significant stenosis.  MRI brain wo contrast 01/25/2024: No evidence of recent infarction, hemorrhage, or mass. No hemodynamically significant stenosis.   Thank you for allowing me to participate in patient's care.  If I can answer any additional questions, I would be pleased to do so.    Sincerely,    Tenoch Mcclure K. Tobie, DO

## 2024-03-17 ENCOUNTER — Ambulatory Visit
Admission: RE | Admit: 2024-03-17 | Discharge: 2024-03-17 | Disposition: A | Discharge status: Home / Self Care | Source: Ambulatory Visit | Attending: Neurology | Admitting: Neurology

## 2024-03-17 DIAGNOSIS — G459 Transient cerebral ischemic attack, unspecified: Secondary | ICD-10-CM

## 2024-03-17 DIAGNOSIS — R292 Abnormal reflex: Secondary | ICD-10-CM

## 2024-03-17 DIAGNOSIS — M542 Cervicalgia: Secondary | ICD-10-CM

## 2024-03-22 ENCOUNTER — Ambulatory Visit: Payer: Self-pay | Admitting: Neurology

## 2024-03-22 DIAGNOSIS — M4802 Spinal stenosis, cervical region: Secondary | ICD-10-CM

## 2024-03-30 ENCOUNTER — Other Ambulatory Visit: Payer: Self-pay | Admitting: Rheumatology

## 2024-03-30 NOTE — Telephone Encounter (Signed)
Please advise patient to get labs.

## 2024-03-30 NOTE — Telephone Encounter (Signed)
 Patient called the office back, advised she is going to come in on the 28th to get her labs drawn.

## 2024-03-30 NOTE — Telephone Encounter (Addendum)
 Last Fill: 01/25/2024  Eye exam: 02/06/2024 WNL   Labs: 11/09/2023 Total Protein 9.0 Rest of CMP and CBC WNL   Patient to update labs at next appointment  Next Visit: 05/07/2024  Last Visit: 12/06/2023  IK:Myzlfjunpi arthritis involving multiple sites with positive rheumatoid factor   Current Dose per office note 12/06/2023: Plaquenil  200 mg 2 tablets alternating with 1 tablet every other day Monday through friday and 1 tablet Saturday and Sundays   Okay to refill Plaquenil ?

## 2024-03-30 NOTE — Telephone Encounter (Signed)
 Attempted to contacted patient but call could not be completed.

## 2024-04-20 ENCOUNTER — Other Ambulatory Visit: Payer: Self-pay | Admitting: *Deleted

## 2024-04-20 DIAGNOSIS — M0579 Rheumatoid arthritis with rheumatoid factor of multiple sites without organ or systems involvement: Secondary | ICD-10-CM

## 2024-04-20 DIAGNOSIS — M109 Gout, unspecified: Secondary | ICD-10-CM

## 2024-04-20 DIAGNOSIS — Z79899 Other long term (current) drug therapy: Secondary | ICD-10-CM

## 2024-04-21 LAB — COMPREHENSIVE METABOLIC PANEL WITH GFR
AG Ratio: 1.2 (calc) (ref 1.0–2.5)
ALT: 14 U/L (ref 6–29)
AST: 16 U/L (ref 10–35)
Albumin: 4.3 g/dL (ref 3.6–5.1)
Alkaline phosphatase (APISO): 82 U/L (ref 37–153)
BUN: 18 mg/dL (ref 7–25)
CO2: 29 mmol/L (ref 20–32)
Calcium: 9.5 mg/dL (ref 8.6–10.4)
Chloride: 101 mmol/L (ref 98–110)
Creat: 0.65 mg/dL (ref 0.60–1.00)
Globulin: 3.6 g/dL (ref 1.9–3.7)
Glucose, Bld: 94 mg/dL (ref 65–99)
Potassium: 4 mmol/L (ref 3.5–5.3)
Sodium: 140 mmol/L (ref 135–146)
Total Bilirubin: 0.5 mg/dL (ref 0.2–1.2)
Total Protein: 7.9 g/dL (ref 6.1–8.1)
eGFR: 91 mL/min/1.73m2 (ref >=60)

## 2024-04-21 LAB — CBC WITH DIFFERENTIAL/PLATELET
Absolute Lymphocytes: 2336 {cells}/uL (ref 850–3900)
Absolute Monocytes: 726 {cells}/uL (ref 200–950)
Basophils Absolute: 61 {cells}/uL (ref 0–200)
Basophils Relative: 1 %
Eosinophils Absolute: 293 {cells}/uL (ref 15–500)
Eosinophils Relative: 4.8 %
HCT: 38.3 % (ref 35.0–45.0)
Hemoglobin: 12.4 g/dL (ref 11.7–15.5)
MCH: 29.5 pg (ref 27.0–33.0)
MCHC: 32.4 g/dL (ref 32.0–36.0)
MCV: 91 fL (ref 80.0–100.0)
MPV: 11.1 fL (ref 7.5–12.5)
Monocytes Relative: 11.9 %
Neutro Abs: 2684 {cells}/uL (ref 1500–7800)
Neutrophils Relative %: 44 %
Platelets: 170 Thousand/uL (ref 140–400)
RBC: 4.21 Million/uL (ref 3.80–5.10)
RDW: 13.9 % (ref 11.0–15.0)
Total Lymphocyte: 38.3 %
WBC: 6.1 Thousand/uL (ref 3.8–10.8)

## 2024-04-21 LAB — SEDIMENTATION RATE: Sed Rate: 29 mm/h (ref 0–30)

## 2024-04-21 LAB — URIC ACID: Uric Acid, Serum: 4.9 mg/dL (ref 2.5–7.0)

## 2024-04-23 NOTE — Progress Notes (Signed)
 Office Visit Note  Patient: Isabella Dixon             Date of Birth: October 14, 1945           MRN: [id number]             PCP: Purcell Emil Schanz, MD Referring: Purcell Emil Schanz, * Visit Date: 05/07/2024 Occupation: @GUAROCC @  Subjective:  Medication monitoring  History of Present Illness: Isabella Dixon is a 78 y.o. female with history of seropositive rheumatoid arthritis, osteoarthritis, and gout.  Patient remains on Plaquenil  200 mg 2 tablets alternating with 1 tablet every other day Monday through friday and 1 tablet Saturday and Sundays.  She is tolerating Plaquenil  without any side effects and has not had any recent gaps in therapy.  She denies any signs or symptoms of a rheumatoid arthritis flare.  Patient states that she was recently experiencing increased neck pain and was evaluated by her neurologist.  Patient states that she was started on tizanidine  2 mg at bedtime which has helped to alleviate her symptoms and has improved the range of motion in her neck.  She has had less nocturnal pain. She denies any signs or symptoms of a gout flare.  She has been taking probenecid  as prescribed and colchicine  as needed. She denies any recent or recurrent infections.  She denies any new medical conditions.    Activities of Daily Living:  Patient reports morning stiffness for less than 5 minutes.   Patient Denies nocturnal pain.  Difficulty dressing/grooming: Denies Difficulty climbing stairs: Reports Difficulty getting out of chair: Denies Difficulty using hands for taps, buttons, cutlery, and/or writing: Reports  Review of Systems  Constitutional:  Negative for fatigue.  HENT:  Negative for mouth sores and mouth dryness.   Eyes:  Negative for dryness.  Respiratory:  Negative for shortness of breath.   Cardiovascular:  Negative for chest pain and palpitations.  Gastrointestinal:  Negative for blood in stool, constipation and diarrhea.  Endocrine: Negative for  increased urination.  Genitourinary:  Negative for involuntary urination.  Musculoskeletal:  Positive for joint swelling and morning stiffness. Negative for joint pain, gait problem, joint pain, myalgias, muscle weakness, muscle tenderness and myalgias.  Skin:  Positive for hair loss and sensitivity to sunlight. Negative for color change and rash.  Allergic/Immunologic: Negative for susceptible to infections.  Neurological:  Negative for dizziness and headaches.  Hematological:  Negative for swollen glands.  Psychiatric/Behavioral:  Negative for depressed mood and sleep disturbance. The patient is not nervous/anxious.     PMFS History:  Patient Active Problem List   Diagnosis Date Noted   TIA (transient ischemic attack) 11/09/2023   Rheumatoid arthritis involving multiple sites with positive rheumatoid factor (HCC) 11/04/2021   Polyarthralgia 07/17/2021   Osteoporosis 05/05/2021   Primary osteoarthritis of both hands 06/21/2019   Primary osteoarthritis of both feet 06/21/2019   Primary osteoarthritis of both knees 06/21/2019   History of sciatica 09/06/2017   Obesity, diabetes, and hypertension syndrome (HCC) 09/06/2017   History of gout 09/06/2017   Dyslipidemia associated with type 2 diabetes mellitus (HCC) 05/13/2015   Hypothyroid 07/25/2012   Gouty arthritis 05/02/2012   Osteoarthritis 05/02/2012   Hypertension associated with diabetes (HCC) 05/02/2012   Hyperlipidemia 05/02/2012   Abnormal Papanicolaou smear of vagina 05/02/2012    Past Medical History:  Diagnosis Date   Anemia    Arthritis    Cataract    beginning   Diabetes mellitus without complication (HCC)    Gout  High cholesterol    Hypertension    Osteoporosis    Thyroid  disease    Walking pneumonia     Family History  Problem Relation Age of Onset   Heart disease Mother    Stroke Mother    Hypertension Mother    Stroke Father    Colon cancer Sister        died earl 6's   ALS Brother    Heart  disease Brother    Stroke Maternal Grandmother    Stroke Paternal Grandmother    Cancer Paternal Grandmother    Cancer Paternal Grandfather    SIDS Son    Testicular cancer Son    Down syndrome Son    Colon polyps Neg Hx    Crohn's disease Neg Hx    Esophageal cancer Neg Hx    Stomach cancer Neg Hx    Ulcerative colitis Neg Hx    Rectal cancer Neg Hx    Past Surgical History:  Procedure Laterality Date   ABDOMINAL HYSTERECTOMY     COLONOSCOPY     x 2   COLONOSCOPY  01/20/2023   COLONOSCOPY  10/2023   COLONOSCOPY WITH PROPOFOL  N/A 05/19/2023   Procedure: COLONOSCOPY WITH PROPOFOL ;  Surgeon: Stacia Glendia BRAVO, MD;  Location: [facility];  Service: Gastroenterology;  Laterality: N/A;   ENDOSCOPIC MUCOSAL RESECTION  05/19/2023   Procedure: ENDOSCOPIC MUCOSAL RESECTION;  Surgeon: Stacia Glendia BRAVO, MD;  Location: [facility];  Service: Gastroenterology;;   HEMOSTASIS CLIP PLACEMENT  05/19/2023   Procedure: HEMOSTASIS CLIP PLACEMENT;  Surgeon: Stacia Glendia BRAVO, MD;  Location: [facility];  Service: Gastroenterology;;   POLYPECTOMY N/A 05/19/2023   Procedure: POLYPECTOMY;  Surgeon: Stacia Glendia BRAVO, MD;  Location: [facility];  Service: Gastroenterology;  Laterality: N/A;   SUBMUCOSAL LIFTING INJECTION  05/19/2023   Procedure: SUBMUCOSAL LIFTING INJECTION;  Surgeon: Stacia Glendia BRAVO, MD;  Location: [facility];  Service: Gastroenterology;;   TUBAL LIGATION     tumor in stomach removed     Social History   Social History Narrative   Lives at home with husband and son         Are you right handed or left handed? Right Handed    Are you currently employed ? No    What is your current occupation?    Do you live at home alone? No    Who lives with you? Lives with husband and son    What type of home do you live in: 1 story or 2 story? Lives in a one story home       Immunization History  Administered Date(s) Administered   Fluad Quad(high Dose 65+) 05/02/2019,  04/30/2020, 05/05/2021, 05/10/2022   Fluad Trivalent(High Dose 65+) 05/11/2023   INFLUENZA, HIGH DOSE SEASONAL PF 06/15/2018   Influenza Split 05/02/2012   Influenza,inj,Quad PF,6+ Mos 05/08/2013, 04/30/2014, 05/13/2015, 05/17/2016, 09/06/2017   PFIZER(Purple Top)SARS-COV-2 Vaccination 09/17/2019, 10/08/2019, 06/05/2020, 06/04/2021   Pneumococcal Conjugate-13 05/13/2015   Pneumococcal Polysaccharide-23 11/01/2018   Tdap 03/14/2008     Objective: Vital Signs: BP 123/77   Pulse 70   Temp 98.8 F (37.1 C)   Resp 14   Ht 5' 3 (1.6 m)   Wt 195 lb 12.8 oz (88.8 kg)   BMI 34.68 kg/m    Physical Exam Vitals and nursing note reviewed.  Constitutional:      Appearance: She is well-developed.  HENT:     Head: Normocephalic and atraumatic.  Eyes:     Conjunctiva/sclera: Conjunctivae normal.  Cardiovascular:     Rate and Rhythm: Normal rate and regular rhythm.     Heart sounds: Normal heart sounds.  Pulmonary:     Effort: Pulmonary effort is normal.     Breath sounds: Normal breath sounds.  Abdominal:     General: Bowel sounds are normal.     Palpations: Abdomen is soft.  Musculoskeletal:     Cervical back: Normal range of motion.  Lymphadenopathy:     Cervical: No cervical adenopathy.  Skin:    General: Skin is warm and dry.     Capillary Refill: Capillary refill takes less than 2 seconds.  Neurological:     Mental Status: She is alert and oriented to person, place, and time.  Psychiatric:        Behavior: Behavior normal.      Musculoskeletal Exam: C-spine has limited range of motion without rotation.  Thoracic kyphosis noted.  Shoulder joints have good range of motion.  Elbow joints and wrist joints have good range of motion.  CMC joint prominence and thickening bilaterally.  PIP and DIP thickening consistent with osteoarthritis of both hands.  No tenderness or synovitis over MCP joints.  Hip joints have good range of motion no groin pain.  Knee joints have good range of  motion no warmth or effusion.  Ankle joints have good range of motion with no joint tenderness or joint swelling.   CDAI Exam: CDAI Score: -- Patient Global: --; Provider Global: -- Swollen: --; Tender: -- Joint Exam 05/07/2024   No joint exam has been documented for this visit   There is currently no information documented on the homunculus. Go to the Rheumatology activity and complete the homunculus joint exam.  Investigation: No additional findings.  Imaging: No results found.  Recent Labs: Lab Results  Component Value Date   WBC 6.1 04/20/2024   HGB 12.4 04/20/2024   PLT 170 04/20/2024   NA 140 04/20/2024   K 4.0 04/20/2024   CL 101 04/20/2024   CO2 29 04/20/2024   GLUCOSE 94 04/20/2024   BUN 18 04/20/2024   CREATININE 0.65 04/20/2024   BILITOT 0.5 04/20/2024   ALKPHOS 80 11/09/2023   AST 16 04/20/2024   ALT 14 04/20/2024   PROT 7.9 04/20/2024   ALBUMIN 4.6 11/09/2023   CALCIUM  9.5 04/20/2024   GFRAA 86 07/02/2020    Speciality Comments: PLQ Eye Exam 02/06/2024 WNL @ [facility] Follow up in 1 year  PLQ 09/28/2021 Allopurinol- diarrhea  Procedures:  No procedures performed Allergies: Patient has no known allergies.    Assessment / Plan:     Visit Diagnoses: Rheumatoid arthritis involving multiple sites with positive rheumatoid factor (HCC) - RF+, responsive to prednisone  use: She has no synovitis on examination today.  She has clinically been doing well taking Plaquenil  200 mg 2 tablets alternating with 1 tablet every other day Monday through Friday and 1 tablet on Saturday and Sundays.  She is tolerating Plaquenil  without any side effects and has not had any gaps in therapy.  No medication changes will be made at this time.  She was advised to notify us  if she develops signs or symptoms of a flare.  She will follow-up in the office in 5 months or sooner if needed.  High risk medication use - Plaquenil  200 mg 2 tablets alternating with 1 tablet  every other day Monday through friday and 1 tablet Saturday and Sundays.  CBC and CMP updated on 04/20/24.  Lipid panel 11/09/23  PLQ Eye Exam 02/06/2024 WNL @ [facility] Follow up in 1 year   Gouty arthritis -She has not had any signs or symptoms of a gout flare.  She has been taking probenecid  as prescribed and takes colchicine  as needed for flares.  Uric acid WNL-4.9 on 04/20/24.  No medication changes will be made at this time.  She was advised to notify us  if she develops signs or symptoms of a flare.  Primary osteoarthritis of both hands: She has PIP and DIP thickening consistent with osteoarthritis of both hands.  No synovitis noted.  Prominence of both CMC joints noted.  Primary osteoarthritis of both knees: Knee joints have good range of motion no warmth or effusion.  Primary osteoarthritis of both feet: Ankle joints have good range of motion no tenderness or joint swelling.  Neck pain: Under the care of Dr. Tobie. Limited ROM with lateral rotation.  Patient MRI of the cervical spine on 03/17/2024 which revealed multilevel cervical spondylosis appearing worse at C4-C5, C5-C6, C6-C7.  Improved with the use of tizanidine  2 mg at bedtime. Discussed that she may benefit from physical therapy.   Spondylosis of lumbar spine: No midline spinal tenderness. Improved with the use of tizanidine  2 mg at bedtime.  Osteopenia of multiple sites - DEXA updated on 09/28/19: The BMD measured at Femur Neck Right is 0.718 g/cm2 with a T-score of -2.3.  She was advised to get repeat DEXA scan.  Other medical conditions are listed as follows:  Type 2 diabetes mellitus without complication, without long-term current use of insulin  (HCC)  Essential hypertension: Blood pressure was 123/77 today in the office.  History of hypothyroidism  Orders: No orders of the defined types were placed in this encounter.  No orders of the defined types were placed in this encounter.   Follow-Up  Instructions: Return in about 5 months (around 10/07/2024).   Waddell CHRISTELLA Craze, PA-C  Note - This record has been created using Dragon software.  Chart creation errors have been sought, but may not always  have been located. Such creation errors do not reflect on  the standard of medical care.

## 2024-04-24 ENCOUNTER — Ambulatory Visit: Payer: Self-pay | Admitting: Physician Assistant

## 2024-05-07 ENCOUNTER — Encounter: Payer: Self-pay | Admitting: Physician Assistant

## 2024-05-07 ENCOUNTER — Ambulatory Visit: Attending: Physician Assistant | Admitting: Physician Assistant

## 2024-05-07 VITALS — BP 123/77 | HR 70 | Temp 98.8°F | Resp 14 | Ht 63.0 in | Wt 195.8 lb

## 2024-05-07 DIAGNOSIS — M19042 Primary osteoarthritis, left hand: Secondary | ICD-10-CM

## 2024-05-07 DIAGNOSIS — M19071 Primary osteoarthritis, right ankle and foot: Secondary | ICD-10-CM

## 2024-05-07 DIAGNOSIS — I10 Essential (primary) hypertension: Secondary | ICD-10-CM

## 2024-05-07 DIAGNOSIS — M8589 Other specified disorders of bone density and structure, multiple sites: Secondary | ICD-10-CM

## 2024-05-07 DIAGNOSIS — M0579 Rheumatoid arthritis with rheumatoid factor of multiple sites without organ or systems involvement: Secondary | ICD-10-CM

## 2024-05-07 DIAGNOSIS — M109 Gout, unspecified: Secondary | ICD-10-CM

## 2024-05-07 DIAGNOSIS — M170 Bilateral primary osteoarthritis of knee: Secondary | ICD-10-CM

## 2024-05-07 DIAGNOSIS — E119 Type 2 diabetes mellitus without complications: Secondary | ICD-10-CM

## 2024-05-07 DIAGNOSIS — Z8639 Personal history of other endocrine, nutritional and metabolic disease: Secondary | ICD-10-CM

## 2024-05-07 DIAGNOSIS — M542 Cervicalgia: Secondary | ICD-10-CM

## 2024-05-07 DIAGNOSIS — Z79899 Other long term (current) drug therapy: Secondary | ICD-10-CM | POA: Diagnosis not present

## 2024-05-07 DIAGNOSIS — M19072 Primary osteoarthritis, left ankle and foot: Secondary | ICD-10-CM

## 2024-05-07 DIAGNOSIS — M47816 Spondylosis without myelopathy or radiculopathy, lumbar region: Secondary | ICD-10-CM

## 2024-05-07 DIAGNOSIS — M19041 Primary osteoarthritis, right hand: Secondary | ICD-10-CM | POA: Diagnosis not present

## 2024-05-27 ENCOUNTER — Other Ambulatory Visit: Payer: Self-pay | Admitting: Neurology

## 2024-06-02 ENCOUNTER — Other Ambulatory Visit: Payer: Self-pay | Admitting: Emergency Medicine

## 2024-06-02 DIAGNOSIS — I10 Essential (primary) hypertension: Secondary | ICD-10-CM

## 2024-06-02 DIAGNOSIS — E119 Type 2 diabetes mellitus without complications: Secondary | ICD-10-CM

## 2024-06-02 DIAGNOSIS — E785 Hyperlipidemia, unspecified: Secondary | ICD-10-CM

## 2024-06-15 ENCOUNTER — Other Ambulatory Visit: Payer: Self-pay | Admitting: Emergency Medicine

## 2024-06-15 ENCOUNTER — Other Ambulatory Visit: Payer: Self-pay | Admitting: Rheumatology

## 2024-06-15 DIAGNOSIS — E038 Other specified hypothyroidism: Secondary | ICD-10-CM

## 2024-07-17 ENCOUNTER — Ambulatory Visit: Admitting: Neurology

## 2024-07-21 ENCOUNTER — Other Ambulatory Visit: Payer: Self-pay | Admitting: Emergency Medicine

## 2024-07-21 DIAGNOSIS — I10 Essential (primary) hypertension: Secondary | ICD-10-CM

## 2024-07-24 ENCOUNTER — Other Ambulatory Visit: Payer: Self-pay | Admitting: Physician Assistant

## 2024-07-24 ENCOUNTER — Other Ambulatory Visit: Payer: Self-pay | Admitting: Rheumatology

## 2024-07-24 NOTE — Telephone Encounter (Signed)
 Last Fill: 03/30/2024  Eye exam: 02/06/2024 WNL   Labs: 04/20/2024 CBC and CMP [facility] Uric acid WNL  ESR WNL  Next Visit: 10/26/2024  Last Visit: 05/07/2024  IK:Myzlfjunpi arthritis involving multiple sites with positive rheumatoid factor   Current Dose per office note 05/07/2024: Plaquenil  200 mg 2 tablets alternating with 1 tablet every other day Monday through friday and 1 tablet Saturday and Sundays.   Okay to refill Plaquenil ?

## 2024-09-11 ENCOUNTER — Ambulatory Visit (INDEPENDENT_AMBULATORY_CARE_PROVIDER_SITE_OTHER): Admitting: Neurology

## 2024-09-11 ENCOUNTER — Encounter: Payer: Self-pay | Admitting: Neurology

## 2024-09-11 VITALS — BP 146/87 | HR 74 | Ht 63.0 in | Wt 194.0 lb

## 2024-09-11 DIAGNOSIS — M4802 Spinal stenosis, cervical region: Secondary | ICD-10-CM | POA: Diagnosis not present

## 2024-09-11 DIAGNOSIS — G459 Transient cerebral ischemic attack, unspecified: Secondary | ICD-10-CM | POA: Diagnosis not present

## 2024-09-11 MED ORDER — TIZANIDINE HCL 2 MG PO TABS: 2.0000 mg | ORAL_TABLET | Freq: Every evening | ORAL | 3 refills | Status: AC | PRN

## 2024-09-11 NOTE — Progress Notes (Signed)
 "   Follow-up Visit   Date: 09/11/2024    Isabella Dixon MRN: [id number] DOB: September 03, 1945    Isabella Dixon is a 79 y.o. right-handed Caucasian female with well-controlled diabetes mellitus (HbA1c 5.9), hypertension, hyperlipidemia, hypothyroidism, gout, and RA returning to the clinic for follow-up of TIA.  The patient was accompanied to the clinic by self.  IMPRESSION/PLAN: 1. TIA manifesting with right hand weakness and dysarthria (10/2023).  Cryptogenic.  No further spells.  - Continue aspirin  81mg  daily  - Continue crestor  40mg  daily   2.  Multilevel cervical spondylosis at C4-5, C5-6 and C6-7 with biforaminal stenosis and canal stenosis.  Clinically, she is doing well without ongoing neck pain.  No upper extremity paresthesias or weakness.  - Continue tizanidine  2mg  at bedtime as needed for neck pain  - If new symptoms develop, start neck PT  Return to clinic in 1 year   --------------------------------------------- History of present illness: On 3/16, she was watching TV and suddenly develop tingling and cramp-like sensation involving the right hand along with slurred speech.  Symptoms lasted 30-min and then resolved.  She has not had recurrence of symptoms.  She denied any right leg symptoms or facial weakness/numbness.  She does not take a daily aspirin .     She reports having two spells of difficulty with speech lasting about 15 minutes, last in 2015.   UPDATE 02/28/2024:  She is here for follow-up visit.  She has not had any more spells of speech change or right hand weakness.  She is compliant with all medications, including aspirin  and crestor .   Her more bothersome issue is neck pain and stiffness.  She reports that pain is severe at times and exacerbated by neck rotation or even bumpy car ride, such as going over railroad tracks or a dip in the road.  No arm or leg weakness/numbness.   UPDATE 09/11/2024:   Discussed the use of AI scribe software for clinical  note transcription with the patient, who gave verbal consent to proceed.  History of Present Illness Her neck pain has improved significantly, and she did not undergo physical therapy as the pain subsided before she could start. An MRI multilevel cervical degenerative changes in the neck. She was prescribed tizanidine , initially taken every night, which effectively managed her symptoms. She now takes it as needed when the pain bothers her.  She continues to take aspirin  and cholesterol medication. No new numbness, tingling, or symptoms suggestive of a transient ischemic attack or stroke.    Medications:  Current Outpatient Medications on File Prior to Visit  Medication Sig Dispense Refill   Aspirin  81 MG CAPS Take 81 mg by mouth daily.     chlorpheniramine (CHLOR-TRIMETON) 4 MG tablet Take 4 mg by mouth daily.     Cholecalciferol (VITAMIN D -3 PO) Take 2,000 Units by mouth daily.     colchicine  0.6 MG tablet Take 1 tablet (0.6 mg total) by mouth 2 (two) times daily. (Patient taking differently: Take 0.6 mg by mouth as needed.) 180 tablet 0   hydroxychloroquine  (PLAQUENIL ) 200 MG tablet TAKE 2 TABLETS ALTERNATING WITH 1 TABLET MONDAY THROUGH FRIDAY AND 1 TABLET ON SATURDAY AND SUNDAY AS DIRECTED 120 tablet 0   losartan  (COZAAR ) 100 MG tablet TAKE 1 TABLET DAILY 90 tablet 3   metFORMIN  (GLUCOPHAGE ) 500 MG tablet TAKE 1 TABLET TWICE DAILY  WITH MEALS 180 tablet 3   metoprolol  tartrate (LOPRESSOR ) 25 MG tablet TAKE 1 TABLET TWICE A DAY 180 tablet 3  Multiple Vitamin (MULTIVITAMIN) tablet Take 1 tablet by mouth daily.     probenecid  (BENEMID ) 500 MG tablet TAKE 1 TABLET 3 TIMES A DAY 270 tablet 3   rosuvastatin  (CRESTOR ) 10 MG tablet TAKE 1 TABLET DAILY 90 tablet 3   SYNTHROID  75 MCG tablet TAKE 1 TABLET DAILY 90 tablet 3   tiZANidine  (ZANAFLEX ) 2 MG tablet TAKE 1 TABLET BY MOUTH AT BEDTIME AS NEEDED FOR MUSCLE SPASMS. 90 tablet 1   vitamin C (ASCORBIC ACID) 250 MG tablet Take 250 mg by mouth  daily.     No current facility-administered medications on file prior to visit.    Allergies: No Known Allergies  Vital Signs:  BP (!) 146/87   Pulse 74   Ht 5' 3 (1.6 m)   Wt 194 lb (88 kg)   SpO2 96%   BMI 34.37 kg/m   Neurological Exam: MENTAL STATUS including orientation to time, place, person, recent and remote memory, attention span and concentration, language, and fund of knowledge is normal.  Speech is not dysarthric.  CRANIAL NERVES:   Pupils equal round and reactive to light.  Normal conjugate, extra-ocular eye movements in all directions of gaze.  No ptosis.  Face is symmetric.   MOTOR:  Motor strength is 5/5 in all extremities.  No atrophy, fasciculations or abnormal movements.  No pronator drift.  Tone is normal.    MSRs:  Reflexes are 3+/4 in the arms and 2+/4 in the legs.  SENSORY:  Intact to vibration throughout.  COORDINATION/GAIT:  Normal finger-to- nose-finger.  Intact rapid alternating movements bilaterally.  Gait narrow based and stable.   Data: MRA head and neck wwo contrast 01/25/2024: No evidence of recent infarction, hemorrhage, or mass.  No hemodynamically significant stenosis.  MRI brain wo contrast 01/25/2024: No evidence of recent infarction, hemorrhage, or mass. No hemodynamically significant stenosis.  MRI cervical spine 03/17/2024: Multilevel cervical spondylosis appears worst at C4-5, C5-6 and C6-7 where the ventral thecal sac is effaced. Moderately severe to severe foraminal narrowing is present bilaterally at C4-5 and C5-6 and on the left at C6-7.     Thank you for allowing me to participate in patient's care.  If I can answer any additional questions, I would be pleased to do so.    Sincerely,    Dondi Aime K. Tobie, DO   "

## 2024-10-08 ENCOUNTER — Ambulatory Visit: Admitting: Neurology

## 2024-10-26 ENCOUNTER — Ambulatory Visit: Admitting: Rheumatology

## 2025-09-11 ENCOUNTER — Ambulatory Visit: Payer: Self-pay | Admitting: Neurology
